# Patient Record
Sex: Male | Born: 1955 | Race: White | Hispanic: No | Smoking: Former smoker
Health system: Southern US, Community
[De-identification: ages and names within clinical notes are randomized; demographics above are authoritative.]

## PROBLEM LIST (undated history)

## (undated) DIAGNOSIS — I10 Essential (primary) hypertension: Secondary | ICD-10-CM

## (undated) DIAGNOSIS — E119 Type 2 diabetes mellitus without complications: Secondary | ICD-10-CM

## (undated) DIAGNOSIS — I639 Cerebral infarction, unspecified: Secondary | ICD-10-CM

## (undated) DIAGNOSIS — I509 Heart failure, unspecified: Secondary | ICD-10-CM

## (undated) DIAGNOSIS — J189 Pneumonia, unspecified organism: Secondary | ICD-10-CM

## (undated) DIAGNOSIS — K219 Gastro-esophageal reflux disease without esophagitis: Secondary | ICD-10-CM

## (undated) DIAGNOSIS — I219 Acute myocardial infarction, unspecified: Secondary | ICD-10-CM

## (undated) DIAGNOSIS — I2510 Atherosclerotic heart disease of native coronary artery without angina pectoris: Secondary | ICD-10-CM

## (undated) DIAGNOSIS — E1140 Type 2 diabetes mellitus with diabetic neuropathy, unspecified: Secondary | ICD-10-CM

## (undated) DIAGNOSIS — E785 Hyperlipidemia, unspecified: Secondary | ICD-10-CM

## (undated) DIAGNOSIS — E11319 Type 2 diabetes mellitus with unspecified diabetic retinopathy without macular edema: Secondary | ICD-10-CM

## (undated) HISTORY — DX: Type 2 diabetes mellitus without complications: E11.9

## (undated) HISTORY — PX: BELOW KNEE LEG AMPUTATION: SUR23

## (undated) HISTORY — PX: PERCUTANEOUS PLACEMENT INTRAVASCULAR STENT CERVICAL CAROTID ARTERY: SUR1019

## (undated) HISTORY — PX: APPENDECTOMY: SHX54

## (undated) HISTORY — DX: Cerebral infarction, unspecified: I63.9

---

## 2005-07-11 DIAGNOSIS — I639 Cerebral infarction, unspecified: Secondary | ICD-10-CM

## 2005-07-11 HISTORY — DX: Cerebral infarction, unspecified: I63.9

## 2006-01-17 ENCOUNTER — Inpatient Hospital Stay (HOSPITAL_COMMUNITY): Admission: EM | Admit: 2006-01-17 | Discharge: 2006-01-29 | Payer: Self-pay | Admitting: Emergency Medicine

## 2006-01-17 ENCOUNTER — Ambulatory Visit: Payer: Self-pay | Admitting: Internal Medicine

## 2006-01-18 ENCOUNTER — Encounter (INDEPENDENT_AMBULATORY_CARE_PROVIDER_SITE_OTHER): Payer: Self-pay | Admitting: Neurology

## 2006-01-18 ENCOUNTER — Encounter: Payer: Self-pay | Admitting: Cardiovascular Disease

## 2006-01-18 ENCOUNTER — Ambulatory Visit: Payer: Self-pay | Admitting: Cardiovascular Disease

## 2006-01-19 ENCOUNTER — Ambulatory Visit: Payer: Self-pay | Admitting: Physical Medicine & Rehabilitation

## 2006-03-17 ENCOUNTER — Emergency Department (HOSPITAL_COMMUNITY): Admission: EM | Admit: 2006-03-17 | Discharge: 2006-03-18 | Payer: Self-pay | Admitting: Emergency Medicine

## 2006-04-21 ENCOUNTER — Ambulatory Visit: Payer: Self-pay | Admitting: Cardiology

## 2006-04-25 ENCOUNTER — Ambulatory Visit: Payer: Self-pay

## 2006-04-25 ENCOUNTER — Encounter: Payer: Self-pay | Admitting: Cardiology

## 2006-07-03 ENCOUNTER — Ambulatory Visit (HOSPITAL_COMMUNITY): Admission: RE | Admit: 2006-07-03 | Discharge: 2006-07-03 | Payer: Self-pay | Admitting: Unknown Physician Specialty

## 2006-07-24 ENCOUNTER — Encounter: Payer: Self-pay | Admitting: Interventional Radiology

## 2006-10-10 ENCOUNTER — Ambulatory Visit (HOSPITAL_COMMUNITY): Admission: RE | Admit: 2006-10-10 | Discharge: 2006-10-11 | Payer: Self-pay | Admitting: Interventional Radiology

## 2006-10-24 ENCOUNTER — Encounter: Payer: Self-pay | Admitting: Interventional Radiology

## 2006-12-12 ENCOUNTER — Inpatient Hospital Stay (HOSPITAL_COMMUNITY): Admission: EM | Admit: 2006-12-12 | Discharge: 2006-12-19 | Payer: Self-pay | Admitting: Emergency Medicine

## 2007-05-04 ENCOUNTER — Ambulatory Visit (HOSPITAL_COMMUNITY): Admission: RE | Admit: 2007-05-04 | Discharge: 2007-05-04 | Payer: Self-pay | Admitting: Interventional Radiology

## 2007-06-14 ENCOUNTER — Inpatient Hospital Stay (HOSPITAL_COMMUNITY): Admission: RE | Admit: 2007-06-14 | Discharge: 2007-06-15 | Payer: Self-pay | Admitting: Interventional Radiology

## 2007-06-28 ENCOUNTER — Encounter: Payer: Self-pay | Admitting: Interventional Radiology

## 2008-01-25 ENCOUNTER — Ambulatory Visit: Payer: Self-pay | Admitting: *Deleted

## 2008-01-25 ENCOUNTER — Ambulatory Visit: Payer: Self-pay | Admitting: Thoracic Surgery (Cardiothoracic Vascular Surgery)

## 2008-01-26 ENCOUNTER — Inpatient Hospital Stay (HOSPITAL_COMMUNITY): Admission: EM | Admit: 2008-01-26 | Discharge: 2008-02-18 | Payer: Self-pay | Admitting: Emergency Medicine

## 2008-01-28 ENCOUNTER — Encounter: Payer: Self-pay | Admitting: Thoracic Surgery (Cardiothoracic Vascular Surgery)

## 2008-01-28 ENCOUNTER — Ambulatory Visit: Payer: Self-pay | Admitting: Surgery

## 2008-02-06 ENCOUNTER — Ambulatory Visit: Payer: Self-pay | Admitting: Physical Medicine & Rehabilitation

## 2008-03-18 ENCOUNTER — Ambulatory Visit: Payer: Self-pay | Admitting: Thoracic Surgery (Cardiothoracic Vascular Surgery)

## 2008-03-18 ENCOUNTER — Encounter
Admission: RE | Admit: 2008-03-18 | Discharge: 2008-03-18 | Payer: Self-pay | Admitting: Thoracic Surgery (Cardiothoracic Vascular Surgery)

## 2008-09-05 ENCOUNTER — Encounter: Payer: Self-pay | Admitting: Interventional Radiology

## 2010-08-01 ENCOUNTER — Encounter: Payer: Self-pay | Admitting: Interventional Radiology
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
## 2010-11-23 NOTE — Op Note (Signed)
NAME:  [name], [name] NO.:  [id number]   MEDICAL RECORD NO.:  [id number]         PATIENT TYPE:  CINP   LOCATION:                               FACILITY:  [facility]   PHYSICIAN:  Salvatore Decent. Dorris Fetch, M.D.DATE OF BIRTH:  1955/07/16   DATE OF PROCEDURE:  01/29/2008  DATE OF DISCHARGE:                               OPERATIVE REPORT   PREOPERATIVE DIAGNOSIS:  Severe 3-vessel coronary artery disease status  post non-Q-wave myocardial infarction.   POSTOPERATIVE DIAGNOSIS:  Severe 3-vessel coronary artery disease status  post non-Q-wave myocardial infarction.   PROCEDURES:  1. Median sternotomy.  2. Extracorporeal circulation.  3. Coronary artery bypass grafting x4 (left internal mammary artery to      mid LAD, saphenous vein graft to obtuse marginal, and sequential      saphenous vein graft to acute marginal and distal right coronary).  4. Endoscopic vein harvest, both thighs.   SURGEON:  Salvatore Decent. Dorris Fetch, MD   ASSISTANTS:  Rowe Clack, PA-C   ANESTHESIA:  General.   FINDINGS:  Saphenous vein in the lower thigh, too small to use  bilaterally, upper thigh vein good quality, likely bifurcated system.  Left ventricular hypertrophy, apical scar.  Distal LAD, heavily  calcified not graftable.  Proximal LAD, fair quality target.  Fair  quality, intramyocardial.  Acute marginal, fair quality.  Distal right  coronary, poor quality vein.  Satisfactory internal mammary artery, good  quality.   CLINICAL NOTE:  Mr. [name] is a 55 year old gentleman with multiple  cardiac risk factors and a known history of coronary artery disease.  He  presents with unstable coronary syndrome and ruled in for non-Q-wave  myocardial infarction.  At cardiac catheterization, he had severe 3-  vessel coronary artery disease with impaired left ventricular function.  The patient was referred for coronary artery bypass grafting.  The  indications, risks, benefits, and alternatives were  discussed in detail  with the patient.  He understood and accepted the risks and agreed to  proceed.   Mr. [place] [name] was brought to the preop holding area on January 29, 2008.  There, the Anesthesia Service placed lines for monitoring, arterial,  central venous, and pulmonary arterial pressure.  Intravenous  antibiotics were administered.  He was taken to the operating room,  anesthetized, and intubated.  A Foley catheter was placed.  The chest,  abdomen, and legs were prepped and draped in the usual fashion.  A  median sternotomy was performed, and the left internal mammary artery  was harvested using standard technique.  This was difficult due to the  patient's body habitus, relatively noncompliant chest wall, and previous  traumatic injury to the upper ribs with deformity.  The mammary however  was a good quality vessel.  Simultaneously, an incision was made in the  medial aspect of the left leg, just below the knee.  The greater  saphenous vein was identified and was harvested to the groin.  After  removing the vein and cannulating, it was apparent that the lower  portion of the vein was not suitable for use as a  bypass graft.  The  upper portion of the vein was satisfactory.  Therefore, an incision was  made in the medial aspect of the right leg, at the level of the knee,  and the greater saphenous vein was then harvested endoscopically from  that side as well.  Again, the lower portion of the thigh vein was  unusable but there was sufficient length in the upper thigh to use as a  bypass graft.  Heparin 5000 units was administered during the vessel  harvest.  The remainder of the full heparin dose was given prior to  opening the pericardium.   The pericardium was opened.  The ascending aorta was inspected.  It was  normal size.  There was no atherosclerotic disease.  The aorta was  cannulated via concentric 2 Ethibond pledgeted pursestring sutures.  A  dual-staged venous cannula was  placed via pursestring suture in the  right atrium and directed into the inferior vena cava.  The  cardiopulmonary bypass was instituted, and the patient was cooled to 32  degrees Celsius.  The coronary arteries were inspected and anastomotic  sites were chosen.  Of note, exposure of the coronaries was difficult.  The obtuse marginal was intramyocardial.  The heart was markedly  enlarged and hypertrophied.  There was distal apical scarring of the  left ventricle.  The distal LAD was a heavily calcified nongraftable  vessel.  There was no graftable target at the apex of the LAD either.  The distal right coronary was heavily calcified.  It was unclear if this  or the acute marginal was the vessel that was seen filling via  collaterals as the proximal right coronary artery was totally occluded  at catheterization.  A decision was made to sequentially graft these 2  vessels.  The conduits were inspected and cut to length.  A foam pad was  placed in the pericardium to protect the left phrenic nerve.  A  temperature probe was placed in the myocardial septum and a cardioplegic  cannula was placed in the ascending aorta.   The aorta was cross clamped.  The left ventricle was emptied via the  aortic root vent.  Cardiac arrest was then achieved with combination of  cold antegrade blood cardioplegia and topical iced saline.  Cardioplegia  1 L was administered.  The myocardial septal temperature was 11 degrees  Celsius.  The following distal anastomoses were performed.   First, a reverse saphenous vein graft was placed sequentially to the  acute marginal and distal right coronary.  The distal right coronary was  a 1.5-mm vessel and the probe did pass but would not pass into any of  the small terminal branches.  It was diffusely diseased and poor  quality.  The acute marginal was a 1.5-mm fair-quality vessel, much  better in quality than the distal right.  A side-to-side anastomosis was  performed  to the acute marginal and end to side to the distal right was  performed with running 7-0 Prolene sutures.  All anastomoses were probed  proximally and distally at their completion to ensure patency.  Cardioplegia was administered down the vein graft to assess flow and  hemostasis.   Next, a reverse saphenous vein graft was placed end to side to the  obtuse marginal branch of the left circumflex.  This was a very  difficult exposure due to the patient's body habitus and the size of the  heart and the vessel being intramyocardial.  The vein graft was  anastomosed end to side with a running 7-0 Prolene suture.  A 1.5-mm  probe did pass easily proximally and distally.  Cardioplegia was  administered.  There was good flow and good hemostasis during the  anastomosis.   Next, the pericardium was divided to allow passage of the left internal  mammary artery.  It was cut to length and then anastomosed end to side  to the mid LAD above the takeoff the large diagonal branch.  The LAD was  too diffusely diseased distally to bypass in that area.  The mammary was  anastomosed end-to-side with a running 8-0 Prolene suture.  The LAD was  a fair-quality target and at that site it was extremely poor distally.  The mammary was a good-quality conduit.  After completion of the mammary  to LAD anastomosis, the bulldog clamps were briefly removed to inspect  for hemostasis.  Septal rewarming was noted.  The bulldog clamp was  replaced.  The mammary pedicle was tacked up to the cardial surface of  the heart with 6-0 Prolene sutures.   An additional dose of cardioplegia then was administered.  The  cardioplegic cannula was removed from the ascending aorta.  The vein  graft was cut to length and the proximal vein graft anastomoses were  performed to 4.5-mm punch aortotomies with running 6-0 Prolene sutures.  At the completion of all proximal anastomosis, the patient was placed in  Trendelenburg position.   Lidocaine was administered.  Bulldog clamp was  again removed from the left mammary artery.  The aortic root was de-  aired and the aortic cross-clamp was removed.  Total cross-clamp time  was 18 minutes.   The patient spontaneously resumed sinus rhythm and did not require  defibrillation.  While the patient was being rewarmed, all proximal and  distal anastomoses were inspected for hemostasis.  Epicardial pacing  wires were placed on the right ventricle and right atrium.  A low-dose  dopamine infusion was initiated at 5 mcg/kg per minute.  When the  patient had rewarmed to a core temperature of 37 degrees Celsius, he was  weaned from cardiopulmonary bypass.  He was paced for sinus bradycardia  at the time of separation from bypass.  He weaned from bypass on the  first attempt without difficulty.  Total bypass time was 127 minutes.  Initial cardiac index was 2 L per minute per meter squared, and the  patient remained hemodynamically stable throughout the post bypass  period.   A test dose of protamine was administered and was well tolerated.  The  atrial and aortic cannulae were removed.  The remainder of the protamine  was administered without incident.  The chest was irrigated with 1 L of  warm normal saline containing 1 g of vancomycin.  Hemostasis was  achieved.  Left pleural and 2 mediastinal chest tubes were placed at  separate subcostal incisions.  Pericardium could not be closed.  The  sternum was closed with interrupted heavy-gauge double stainless steel  wires.  The pectoralis fascia, subcutaneous tissue, and skin were closed  in standard fashion.  All sponge, needle, and instrument counts were  correct at the end of the procedure.  The patient was taken from the  operating room to the Surgical Intensive Care Unit in critical but  stable condition.   Of note, the patient is not a candidate for redo bypass grafting.       Salvatore Decent Dorris Fetch, M.D.  Electronically  Signed     SCH/MEDQ  D:  01/29/2008  T:  01/30/2008  Job:  [id number]   cc:   Corky Crafts, MD

## 2010-11-23 NOTE — Assessment & Plan Note (Signed)
OFFICE VISIT   [name], [name]  DOB:  February 12, 1956                                        March 18, 2008  CHART #:  [id number]   HISTORY:  The patient is a 55 year old disabled vet with a history of  multiple cardiac risk factors and known history of coronary artery  disease who presented with unstable angina and ruled in for a non-Q-wave  myocardial infarction in July 2009.  He subsequently underwent cardiac  catheterization and coronary artery bypass grafting x4.  The surgical  procedure was performed by Dr. Charlett Lango on January 29, 2008.  He  had a somewhat difficult postoperative course due to mobility and  multiple comorbidities, but ultimately did quite well and was discharged  on February 15, 2008.  Since that time, he did have a superficial infection  in the left lower extremity.  Endoscopic vein harvest site was treated  with [facility] course [facility].  This has shown a  significant improvement.  He states that his breathing is much improved  postoperatively.  He no longer has orthopnea, which he apparently did  have preoperatively.  He denies any symptoms of chest pain.  He  interestingly now says that since the surgery, he no longer has daily  headaches.  He is seen today in the office for routine followup.   Chest x-ray is reviewed on today's date.  It shows stable postoperative  changes with no acute findings.  Lungs are clear without evidence of  congestive failure or infiltrates.   PHYSICAL EXAMINATION:  Vital Signs:  Blood pressure 135/84, pulse 82 and  regular, respirations 18, and oxygen saturation is 96% on room air.  General:  He is a well developed, adult, moderately obese disabled male  in no acute distress.  He walks with a cane.  Pulmonary:  Reveals clear  breath sounds throughout.  Cardiac:  Regular rate and rhythm without  murmurs, gallops, or rubs.  Extremities:  Reveal bilateral lower  extremity edema.   Incisions are all healing well without evidence of  infection.   MEDICATIONS:  1. Lopid 600 mg b.i.d.  2. Lisinopril 2.5 mg daily.  3. Plavix 75 mg daily.  4. Amiodarone 200 mg b.i.d.  5. Aspirin 325 mg daily.  6. Prozac 20 mg daily.  7. Lasix 40 mg daily.  8. Neurontin 100 mg t.i.d.  9. Potassium chloride 40 mEq daily.  10.Lopressor 25 mg t.i.d.  11.Prilosec OTC daily.  12.Lantus insulin 40 units subcu nightly.  13.Novolin R 35 units subcu t.i.d.   ASSESSMENT:  The patient continues to make good recovery following a  surgery.  He reports that he will receive his ongoing cardiology  followup through the [facility].  He has an appointment scheduled for  March 31, 2008.  We will see him again on a p.r.n. basis as needed  for any followup surgical issues.   Rowe Clack, P.A.-C.   Sherryll Burger  D:  03/18/2008  T:  03/19/2008  Job:  [id number]   cc:   Corky Crafts, MD

## 2010-11-23 NOTE — Discharge Summary (Signed)
NAMEMarland Kitchen  CODI, KERTZ                 ACCOUNT NO.:  [id number]   MEDICAL RECORD NO.:  [id number]          PATIENT TYPE:  INP   LOCATION:  5711                         FACILITY:  [facility]   PHYSICIAN:  Herbie Saxon, MDDATE OF BIRTH:  03-20-1956   DATE OF ADMISSION:  12/12/2006  DATE OF DISCHARGE:                               DISCHARGE SUMMARY   DISCHARGE DIAGNOSES:  1. Right foot cellulitis and abscess.  2. Diabetes.  3. Hypertension.  4. History of cerebrovascular acc  5. History of coronary artery disease.  6. Hyperlipidemia.  7. Carotid artery disease.  8. Retinopathy.  9. Cataracts.  10.Xerosis.  11.Dysarthria.   HOSPITAL COURSE:  This is a 55 year old male who attends the [facility] in [place].  Primary care physician is Dr. Lesle Chris.  Presented to the [facility] emergency room with swelling and redness of  his right leg which had been ongoing for 5 days.  At presentation he had  a leukocytosis of 15,000.  The glucose level was 192.  The patient was  started on IV vancomycin and Unasyn.  His right foot ulcer has also been  dressed daily.  hypokalemia was supplemented.  The third day of  admission he spiked a high-grade fever of 102.  The blood culture  repeated.  By the fourth day of admission the abscess wound was  discharging purulent material from the right foot ulcer.  He was also  noted to be hypoglycemic, needing reduction of his NPH insulin dose by  half and also reduction of the Glucotrol by half.  Wound culture was  sent; so far, no organism growing.  Blood culture has been negative so  far.  Avelox was added to his antibiotic regimen on December 15, 2006.  The  patient has remained afebrile.  Right foot wound is cool and no longer  discharging purulent material.  He is being discharged home in stable  condition to follow up with his primary care physician, Dr. Lesle Chris, at the  [facility] in [place].  He is to see his primary care physician in 5-  7 days to  have a repeat CBC and BMP at that time.  He is asked to be  seen by the wound care clinic as an outpatient in a week.  The right  foot wound will be packed with Aquacel daily .   DIET:  Will be low sodium, American Heart Association type, low  cholesterol 1800 calorie ADA.   ACTIVITY:  Increase activity slowly.  To be provided with off-loading  shoe for ambulation.   MEDICATIONS ON DISCHARGE:  1. Levaquin 750 mg daily for 1 week.  2. Hydrocodone 10/500 mg q.6h. p.r.n.  3. Plavix 75 mg daily.  4. Restoril 30 mg q.h.s. p.r.n.  5. Iron sulfate 325 mg b.i.d.  6. Vitamin C 500 mg daily.  7. Lasix 20 mg daily.  8. KCl 10 mEq daily.  9. Gemfibrozil 600 mg b.i.d.  10.Simvastatin 40 mg b.i.d.  11.Atenolol 45 mg daily.  12.Lisinopril 40 mg daily.  13.Glipizide 2.5 mg daily.  14.NPH insulin 5 units subcu  q.h.s.  15.Aspirin 325 mg daily.  16.Metformin 500 mg daily.  17.Prilosec 20 mg daily.   DISPOSITION:  Home with home health assistance for dressing change and  wound care.   EXAMINATION TODAY:  GENERAL:  He is a middle-aged man, not in acute  distress.  Temperature is 98, pulse is 84, respiratory rate is 20, blood  pressure 113/72.  Pale, not jaundiced.  Neck supple.  No clubbing or  dehydration.  HEENT:  Head is atraumatic, normocephalic.  Oropharynx is clear.  Posterior pharynx also clear.  CHEST:  Clinically clear, sounds 1 and 2, regular.  ABDOMEN:  Benign.  NEUROLOGIC:  He is alert, oriented x3.  He has dysarthria and mild right  hemiparesis.  He has xerosis of the left lower eyelid and has reduced  visual acuity.  EXTREMITIES:  Right foot punctate wound, granulated, right plantar ulcer  wound, no active bleeding, no tenderness in the right foot dorsum or  plantar surface.   Also note the patient will need to have a podiatry appointment in 2-3  weeks.  This will be coordinated by the primary care physician.  The  patient is also to be seen by orthopedics in 1-2 weeks as an  outpatient.  This is also to be coordinated by the primary care physician.  If  possible, assess the viability of the right small toe.      Herbie Saxon, MD  Electronically Signed     MIO/MEDQ  D:  12/19/2006  T:  12/19/2006  Job:  [id number]

## 2010-11-23 NOTE — Discharge Summary (Signed)
NAME:  MERLEN, GURRY NO.:  [id number]   MEDICAL RECORD NO.:  [id number]          PATIENT TYPE:  INP   LOCATION:  2016                         FACILITY:  [facility]   PHYSICIAN:  Salvatore Decent. Dorris Fetch, M.D.DATE OF BIRTH:  Apr 14, 1956   DATE OF ADMISSION:  01/25/2008  DATE OF DISCHARGE:  02/18/2008                               DISCHARGE SUMMARY   ADDENDUM:  Mr. Groesbeck was tentatively scheduled for transfer to the [facility]  facility on February 14, 2008.  This was unable to be accomplished.  He has  remained quite stable and is showing a steady improvement.  There have  been no additional changes to his medication regimen.  He continues to  follow his current rehabilitation protocols with cardiac rehabilitation  department.  He is continuing to tolerate diet with his current  restrictions and is having no evidence of ongoing dysphagia.  His  incisions all continue to heal quite well.  He is remaining in normal  sinus rhythm without significant ectopy or dysrhythmias.  His oxygen has  been weaned and he has good saturations on room air.  His overall status  is felt to be stable for discharge to the [place] facility on February 18, 2008, pending morning round re-evaluation.  For full details of this  hospitalization please see the previously dictated summary.      Rowe Clack, P.A.-C.      Salvatore Decent Dorris Fetch, M.D.  Electronically Signed    WEG/MEDQ  D:  02/17/2008  T:  02/17/2008  Job:  [id number]   cc:   Salvatore Decent. Dorris Fetch, M.D.  Corky Crafts, MD  Audery Amel, MD  Floyde Parkins
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
## 2010-11-23 NOTE — Discharge Summary (Signed)
NAME:  Clayton [name], Clayton [name]                 ACCOUNT NO.:  [id number]   MEDICAL RECORD NO.:  [id number]          PATIENT TYPE:  OIB   LOCATION:  3106                         FACILITY:  [facility]   PHYSICIAN:  Sanjeev K. Deveshwar, M.D.DATE OF BIRTH:  1955-10-25   DATE OF ADMISSION:  06/14/2007  DATE OF DISCHARGE:  06/15/2007                               DISCHARGE SUMMARY   ADDENDUM:  It was also noted prior to discharge, the patient had low-  grade tachycardia with a heart rate in the low 100s.  The etiology of  this was initially felt secondary to pain.  However, this did not  improve throughout the day.  The patient was afebrile.  His maximum  temperature was 99.2 during the night.  However during the day of  discharge, he was afebrile.  The patient also had some mild hematuria  which was thought secondary to being anticoagulated with heparin and  possible trauma during Foley catheterization placement.  He had a mildly  elevated white count of 11.2 thousand.  Prior to admission, his white  blood cell count was in the 8000 range.  The patient did not appear ill.  He had no specific complaints at time of discharge.  It was recommended  that he have a repeat urinalysis as well as a repeat CBC and potassium  level within 1 week to further evaluate these issues.      Delton See, P.A.    ______________________________  Grandville Silos. Corliss Skains, M.D.    DR/MEDQ  D:  06/15/2007  T:  06/16/2007  Job:  [id number]   cc:   Floyde Parkins

## 2010-11-23 NOTE — H&P (Signed)
NAME:  Clayton [name], Clayton [name] NO.:  [id number]   MEDICAL RECORD NO.:  [id number]          PATIENT TYPE:  INP   LOCATION:  5711                         FACILITY:  [facility]   PHYSICIAN:  Della Goo, M.D. DATE OF BIRTH:  08/28/1955   DATE OF ADMISSION:  12/12/2006  DATE OF DISCHARGE:                              HISTORY & PHYSICAL   CHIEF COMPLAINT:  Redness and swelling of the right leg.   HISTORY OF PRESENT ILLNESS:  This is a 55 year old male who presents to  the emergency department with complaints of swelling and redness of his  right leg for 5 days.  He reports contacting the [facility] in  [place] and was evaluated and placed on Augmentin therapy initially  without relief.  Then, his medication was changed to Keflex on December 11, 2006.  The patient denies having any fevers, chills, shortness of breath  or chest pain, nausea, vomiting.Marland Kitchen   PAST MEDICAL HISTORY:  History of a cerebrovascular accident with right-  sided weakness coronary artery disease, type 2 diabetes mellitus,  hyperlipidemia, occlusion and stenosis of the right carotid artery with  stent placement, hypertension, gastroesophageal reflux disease, cataract  right eye, retinopathy, xerosis and dysarthria.   MEDICATIONS:  1. Keflex 500 mg one p.o. q.6 h for 7-day course.  2. Hydrocodone 10/500 one p.o. q.6 h p.r.n.  3. Plavix 75 mg one p.o. daily.  4  Lasix 20 mg one p.o. daily.  1. Restoril 30 mg one p.o. q.h.s. p.r.n.  2. Gemfibrozil 600 mg one p.o. b.i.d.  3. Atenolol 25 mg one p.o. daily.  4. Flonase nasal spray one spray b.i.d.  5. Glipizide 10 mg one p.o. b.i.d.  6. Lisinopril 40 mg one p.o. daily.  7. Omeprazole 20 mg one p.o. daily.  8. Simvastatin 80 mg half a tablet p.o. b.i.d.  9. Metformin 1000 mg one p.o. b.i.d.  10.NPH 10 units subcu q.h.s.  11.Aspirin 325 mg one p.o. daily.   ALLERGY:  NO KNOWN DRUG ALLERGIES.   SOCIAL HISTORY:  The patient is disabled, lives with his  mother.  No  tobacco history.  No alcohol history.   FAMILY HISTORY:  Noncontributory to this hospitalization.   PHYSICAL EXAMINATION:  HEENT: Normocephalic, atraumatic.  Pupils equally  round and reactive to light.  Extraocular muscles are intact.  There is  no scleral icterus.  Oropharynx is clear.  NECK:  Supple full range.  Full of motion.  No thyromegaly, adenopathy,  jugular venous distension.  CARDIOVASCULAR:  Regular rate and rhythm.  LUNGS:  Clear to auscultation bilaterally.  ABDOMEN:  Positive bowel sounds, soft, nontender, nondistended.  EXTREMITIES:  There is confluent erythema of the right lower extremity  to the mid calf area.  The area is edematous with 2+ edema and painful  to palpation.  NEUROLOGIC:  The patient is alert and oriented x3.  His speech is mildly  dysarthric.  Cranial nerves are intact.  He does have right sided  weakness.   LABORATORY STUDIES:  White blood cell count 18.3, hemoglobin 12.2,  hematocrit 35.5, platelets 166, MCV 86.9.  Sodium 139, potassium 3.9,  chloride 108, bicarb 21.6, BUN 19, creatinine 1.0, glucose 192.   ASSESSMENT:  A 55 year old male being admitted with:  1. Cellulitis right lower extremity.  2. Type 2 diabetes mellitus.  3. Hypertension.  4. Coronary artery disease.  5. Previous CVA.   PLAN:  The patient will be placed on IV antibiotic therapy of vancomycin  and Unasyn.  DVT and GI prophylaxis will be ordered.  Also please note  that the information was obtained through interview with the patient.  Also, a copy of the medical evaluation from the [facility] records.      Della Goo, M.D.  Electronically Signed     HJ/MEDQ  D:  12/13/2006  T:  12/14/2006  Job:  [id number]

## 2010-11-23 NOTE — Consult Note (Signed)
NAME:  Clayton [name], Clayton [name] NO.:  [id number]   MEDICAL RECORD NO.:  [id number]          PATIENT TYPE:  INP   LOCATION:  2901                         FACILITY:  [facility]   PHYSICIAN:  Salvatore Decent. Hendrickson, M.D.DATE OF BIRTH:  1955-12-23   DATE OF CONSULTATION:  01/26/2008  DATE OF DISCHARGE:                                 CONSULTATION   REASON FOR CONSULTATION:  Three-vessel coronary disease status post non-  Q-wave MI.   HISTORY OF PRESENT ILLNESS:  Clayton [name] is a 55 year old gentleman with  multiple medical problems including history of CAD, peripheral vascular  disease, previous right carotid stent, and diabetes.  He became acutely  short of breath about 7:30 last night.  He denied any chest pain or  tightness or pressure.  He came to the [facility] Emergency Room.  He  was placed on BiPAP for acute respiratory failure.  Chest x-ray  suggested edema.  He had nonspecific ST changes on his EKG and ruled in  by enzymes with a troponin of 0.33.  Interestingly, his BNP was only 76.  His subsequent cardiac enzymes revealed a CK of 501 and MB of 39 and a  troponin of 4.02.  He was taken to cardiac catheterization laboratory  this morning by Dr. Catalina Gravel, where he was found to have severe three-  vessel coronary disease.  His right coronary was totally occluded.  The  circumflex had a tight proximal stenosis.  He had a large OM1 which was  diffusely irregular.  His LAD had a proximal 70% stenosis and then in  the distal portion of the vessel, there was severe diffuse disease with  minimal flow.  He had an inferior akinesis.  His ejection fraction was  approximately 35%, and LVEDP was 24.  The patient currently is without  complaint of chest pain or shortness of breath.  He is uncomfortable  having to lie flat on his back following catheterization.   His past medical history is significant for;  1. Coronary artery disease.  2. Previous MI in 1988.  3. History of CVA.  4. Right carotid stent placed in December 2008.  5. Hypertension.  6. Hyperlipidemia.  7. Type 1 insulin-dependent diabetes.  8. Peripheral vascular disease.  9. COPD.  10.Obstructive sleep apnea.  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  12.Gastroesophageal reflux.  13.Pain medication dependence.   His current medications or medications on admission were,  1. Aspirin 325 mg daily.  2. Lasix 40 mg daily.  3. Fluoxetine 20 mg daily.  4. Enalapril 10 mg twice daily.  5. Prilosec 20 mg a day.  6. Metoprolol 25 mg b.i.d.  7. Plavix 75 mg a day, but apparently he has not been taking, it is      unclear.  8. Pravastatin.  9. Lantus insulin 35 units subcu nightly.  10.Gemfibrozil 600 mg b.i.d.   He has no known drug allergies.   His family history is significant for coronary artery disease in his  father.   He is disabled.  He lives with his mother.  He does not smoke, use  alcohol, or drugs but does have a history of pain medication dependence.   REVIEW OF SYSTEMS:  Denied any recent chest pain or shortness of breath  prior to this acute episode.  He states his sugars have been fairly well  controlled.  He does have residual speech impairment from his previous  stroke, bruises easily since he has been on Plavix but had to stop  Plavix because of financial issues.   PHYSICAL EXAMINATION:  GENERAL:  Clayton [name] is a 55 year old white male.  He is in no acute distress.  VITALS SIGNS:  His blood pressure is 125/61, pulse is 106 and regular.  Respirations are 20.  His oxygen saturation is 90% on 2 L nasal cannula.  NEUROLOGIC:  Alert.  He is communicative.  He does have some slurring of  the speech, some halting of the speech, but does comprehend and is  comprehensible.  HEENT:  Poor dentition.  NECK:  No bruits.  CARDIAC:  Regular rate and rhythm.  Normal S1 and S2, did not hear a  murmur.  There is an S4.  LUNGS:  Crackles at the bases.  ABDOMEN:  Soft and nontender.   EXTREMITIES:  Not able to palpate dorsalis pedis or posterior tibial  pulses.  He is status post amputation of his digit on his right foot and  the amputation wound well healed.  He does have multiple scars on his  legs, but he is not able to tell me the origin of.   IMPRESSION:  The patient is a 55 year old diabetic who presents with a  non-Q-wave myocardial infarction, has severe three-vessel coronary  disease.  Coronary bypass graft is indicated for survival benefit and  myocardial preservation.  He does have diffuse disease of his  coronaries, and left anterior descending in particular distally is  horribly diseased  and probably not revascularizable.  However, he does  have a proximal stenosis compromising the midportion of his left  anterior descending, which gives rise to septal perforators and diagonal  branch.  He does have a graftable target in his circumflex as well as  his right coronary distributions.  He will be high risk for surgery  given his extensive medical history including diabetes, chronic  obstructive pulmonary disease, obstructive sleep apnea, previous stroke,  right carotid stent, and peripheral vascular disease.  He does need  carotid duplex to assess for carotid stenosis and also needs vein  mapping.  We will attempt to do these studies on Monday, January 28, 2008,  and then hopefully can proceed with surgery on Tuesday, January 29, 2008.      Salvatore Decent Dorris Fetch, M.D.  Electronically Signed     SCH/MEDQ  D:  01/26/2008  T:  01/26/2008  Job:  [id number]   cc:   Corky Crafts, MD  Floyde Parkins
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
## 2010-11-23 NOTE — Discharge Summary (Signed)
NAME:  Clayton [name], Clayton [name] [name] NO.:  [id number]   MEDICAL RECORD NO.:  [id number]          PATIENT TYPE:  INP   LOCATION:  2016                         FACILITY:  [facility]   PHYSICIAN:  Clayton [name] Clayton [name] [name]. Dorris Fetch, M.D.DATE OF BIRTH:  1955/09/07   DATE OF ADMISSION:  01/25/2008  DATE OF DISCHARGE:                               DISCHARGE SUMMARY   FINAL DIAGNOSIS:  Severe three-vessel coronary artery disease, status  post non-Q-wave myocardial infarction.   IN-HOSPITAL DIAGNOSES:  1. Left 60% to 80% internal carotid artery stenosis.  2. Volume overload postoperatively.  3. Acute blood loss anemia postoperatively.  4. Postoperative deconditioning.  5. Postoperative atrial fibrillation.  6. Postoperative moderate-to-severe oropharyngeal dysphagia.  7. Postoperative bronchitis.   SECONDARY DIAGNOSES:  1. History of coronary artery disease with history of myocardial      infarction in 1988.  2. History of cerebrovascular accident.  3. Status post right carotid stent placed in December 2008.  4. Hypertension.  5. Hyperlipidemia.  6. Type 1 insulin-dependent diabetes.  7. Peripheral vascular disease.  8. Chronic obstructive pulmonary disease.  9. Obstructive sleep apnea.  10.Previous right foot forefoot amputation.  11.Gastroesophageal reflux disease.  12.Pain medication dependence.   IN-HOSPITAL OPERATIONS AND PROCEDURES:  1. Cardiac catheterization.  2. Coronary artery bypass grafting x4 using a left internal mammary      artery to mid LAD, saphenous vein graft to obtuse marginal,      sequential saphenous vein graft to acute marginal and distal right      coronary artery.  Endoscopic vein harvest from bilateral thighs.   HISTORY AND PHYSICAL/HOSPITAL COURSE:  Mr. Clayton [name] [name] is a 55 year old  gentleman with multiple medical problems,  including history of coronary  artery disease, peripheral vascular disease, previous right carotid  stent, and diabetes.  The patient developed  acute shortness of breath  and came to [facility] emergency room.  The patient had nonspecific ST  changes on EKG and ruled in by enzymes with a troponin of 0.33.  The  patient was taken to cardiac catheterization by Dr. Eldridge Dace, where he  was found to have severe 3-vessel coronary artery disease.  Dr.  Dorris Fetch was consulted following cardiac catheterization.  Dr.  Dorris Fetch saw and evaluated the patient.  He discussed with the  patient undergoing coronary artery bypass grafting.  He discussed the  risks and benefits with the patient.  The patient nodded his  understanding and agreed to proceed.  Surgery was scheduled for January 29, 2008.  Preoperatively the patient underwent bilateral carotid duplex  ultrasound showing on the right no ICA stenosis.  On the left he had 60%  to 80% ICA stenosis.  The patient also had preoperative ABIs done,  showing bilaterally to be 8.86.  The patient remained stable  preoperatively.  For details of the patient's past medical history and  physical exam, please see dictated H&P.   The patient was taken to the operating room, where he underwent coronary  artery bypass grafting x4 using a left internal mammary artery to mid  LAD, saphenous vein graft to obtuse marginal,  sequential saphenous vein  graft to acute marginal and distal right coronary artery.  Bilateral  endovein harvesting of thighs was done.  The patient tolerated this  procedure well and was transferred to the intensive care unit in stable  condition.  Postoperatively, the patient was noted to be hemodynamically  stable.  He was able to be extubated the evening of surgery.  Post  extubation, the patient noted to be alert and oriented  x4.  Neuro  intact.  Postoperatively the patient was able to be weaned from all  drips.  He had minimum drainage from chest tubes and postoperative chest  x-ray was stable.  All lines and chest tubes were discontinued in the  normal fashion.  The patient  had several complications postoperatively.  His pulmonary status was slow to progress.  He continued to be  encouraged to use his incentive spirometer.  The patient required nasal  cannula 2 liters postoperatively.  Followup chest x-rays were obtained,  which showed persistent atelectasis.  The patient developed  leukocytosis, felt questionable secondary to pulmonary status.  He was  started on empiric Avelox.  The patient's pulmonary status did start to  improve,  and he was eventually able to be weaned off oxygen, satting  greater than 90% on room air.  We will plan to continue p.o. Avelox for  a total of 10 days.   From a cardiac standpoint, the patient was noted to be in normal sinus  rhythm postoperatively.  Heart rate and blood pressure were stable.  Unfortunately, the patient went into postoperative atrial fibrillation  around postop day 8.  He was started on IV amiodarone and continued on  Lopressor.  Cardiology was asked to come and reevaluate the patient.  They agreed with IV amiodarone.  The patient was able to convert back to  normal sinus rhythm with medication.  He was switched from IV amiodarone  to p.o.  The patient did remain in normal sinus rhythm the remainder of  his postoperative course.  The patient's heart rate and blood pressure  remained stable on amiodarone and Lopressor.   Postoperatively, the patient was having difficulty swallowing.  A Panda  tube was placed, and he was started on tube feeds.  Speech therapy was  consulted.  A bedside swallow evaluation was done, and it was felt that  the patient was at high aspiration risk secondary to dysphagia.  They  recommended doing a FEES study.  This was done on July 24, showing  moderate oropharyngeal dysphagia exacerbated with fatigue.  They  recommended placing the patient NPO and to  repeat FEES in several days.  The patient was continued on tube feeds and placed NPO at that time.  Speech repeated MBS on July 29,  which showed that the patient continued  with severe pharyngeal dysphagia.  Recommended keeping NPO and repeating  in several days.  A repeat MBS was obtained on August 4, which showed  improvement in the patient's dysphagia.  They recommended advancing the  patient to a D-III diet with nectar-thick liquids.  They evaluated the  patient with this on August 5 and felt that the patient was tolerating  well.  Recommended continuing on a D-III diet with nectar-thick liquids.  The patient's tube feeds and Panda tube were discontinued February 13, 2008.  The patient continued to tolerate diet well.   Postoperatively, the patient also developed significant volume overload.  He required several doses of diuretics.  Daily weights were obtained.  The patient's volume overload was improving prior to discharge.  The  patient also had some acute blood loss anemia postoperatively.  This  remained stable.  The patient did not require any transfusions.  Hemoglobin and hematocrit were monitored and remained stable.  Last H&H  were 9.6 and 28.9.  Postoperatively the patient was working with cardiac  rehab with ambulation.  The patient was slow to progress.  Physical  therapy was consulted.  PT started to work with the patient and felt  that he was significantly deconditioned.  They recommended inpatient  rehab versus skilled nursing facility.  Rehab was consulted, who felt  that the patient would benefit better from a skilled nursing facility.  Case management consulted and discussed with the patient possible  placement.  Search was started.  Currently, the patient may have a bed  today, February 14, 2008.   Postoperatively, the patient's blood sugars were followed closely.  He  is known to be a type 1 diabetic.  Lantus insulin was adjusted for the  patient when started on tube feedings.  Once started taking in oral  diet, Lantus insulin was increased appropriately.  Blood sugars were  followed and were  stable prior to discharge.   On February 14, 2008, the patient was noted to be in normal sinus rhythm.  Pulmonary status was stable.  He was afebrile.  Blood pressure, heart  rate stable.  Last labs obtained on August 4 showed a white blood cell  count 17.3, hemoglobin 9.6, hematocrit 28.9, and platelet count 310.  Sodium 135, potassium 4.0, chloride 96, bicarb of 33, BUN of 15,  creatinine 0.97, and glucose of 85.  The patient's incisions noted all  to be clean, dry and intact and healing well.  The patient was felt to  be medically stable for transfer to a skilled nursing facility today,  August, 6, 2009.   FOLLOWUP APPOINTMENTS:  Our office will contact the patient with a  followup appointment to see Dr. Dorris Fetch in 3 weeks.  The patient  will need to obtain a PA and lateral chest x-ray 30 minutes prior to  this appointment.  The patient will need to follow up with Dr. Eldridge Dace  in 2 weeks.  He will need to contact Dr. Hoyle Barr office to make these  arrangements.   ACTIVITY:  Patient instructed no driving until released to do so, no  lifting over 10 pounds.  He is told to ambulate 3 to 4 times per day,  progress as tolerated and continue working with rehab.  The patient is  to continue using his breathing exercises.   INCISIONAL CARE:  The patient is told he is allowed to shower, washing  his incisions using soap and water.  Please contact the office if he  develops any drainage or opening from any of his incision sites.   DIET:  The patient's diet to be a dysphagia III diet with nectar-thick  liquids.  The patient is to have strict swallow precautions.   DISCHARGE MEDICATIONS:  1. Lopid 600 mg p.o. b.i.d.  2. Plavix 75 mg p.o. daily.  3. Crestor 20 mg at night.  4. Amiodarone 400 mg b.i.d. x10 days, then 200 mg b.i.d.  5. Aspirin 325 mg daily.  6. Colace 100 mg daily.  7. Prozac 20 mg daily.  8. Lasix 40 mg daily.  9. Neurontin 100 mg t.i.d.  10.Potassium chloride 40  mEq daily.  11.Lopressor 25 mg t.i.d.  12.Avelox 400 mg daily x7 days.  13.Protonix 40 mg b.i.d.  14.Lantus insulin 40 units subcu at night.  15.Ultram 50 mg 1 to 2 tablets q.4-6 h p.r.n.  16.Oxycodone 5 mg 1 to 2 tablets q.4-6 h p.r.n.  17.Thick-It Instant Food Thickener 5 grams p.o. p.r.n.   Please evaluate the patient's blood sugars a.c. and h.s.  If remaining  elevated, the patient may need to be restarted on his Novolin R 35 units  subcu 3 times per day.      Theda Belfast, PA      Clayton [name] Clayton [name] [name]. Dorris Fetch, M.D.  Electronically Signed    KMD/MEDQ  D:  02/14/2008  T:  02/14/2008  Job:  [id number]   cc:   Clayton [name] Clayton [name] [name]. Dorris Fetch, M.D.  Corky Crafts, MD

## 2010-11-23 NOTE — H&P (Signed)
NAME:  Clayton [name], STEINHOFF NO.:  [id number]   MEDICAL RECORD NO.:  [id number]          PATIENT TYPE:  EMS   LOCATION:  MAJO                         FACILITY:  [facility]   PHYSICIAN:  Audery Amel, MD    DATE OF BIRTH:  [birth year]   DATE OF ADMISSION:  01/25/2008  DATE OF DISCHARGE:                              HISTORY & PHYSICAL   CARDIOLOGIST:  Corky Crafts, MD.   CHIEF COMPLAINT:  Shortness of breath.   HISTORY OF PRESENT ILLNESS:  Mr. Clayton [name] is a 55 year old obese white male  with a history of CAD (remote MI) and peripheral vascular disease who  presents with a chief complaint of shortness of breath.  The patient  states he was seen earlier today in [place] at the [facility] by  his primary care Keatyn Luck.  At that time, the patient states he was in  his usual state of health.  This evening after eating dinner, he became  acutely short of breath approximately 7:30 in the evening.  His symptoms  continued to worsen therefore, EMS was mobilized.  The patient denies  ever having any chest pain.  He was brought to the emergency department  at [facility] for further evaluation.  On arrival, the patient was in  moderate respiratory distress.  He was initiated on BiPAP and  subsequently his breathing subjectively improved significantly.  Also of  note, the patient was quite hypertensive on presentation with a blood  pressure of 188/116.  He does note that he did not take any of his  medications today.  His initial EKG revealed nonspecific ST/T wave  changes.  His initial cardiac biomarkers were elevated with a troponin-I  0.33 and CK-MB of 17.  His chest x-ray did reveal mild pulmonary edema.  However, his BNP was only 76.  On my initial evaluation, the patient's  breathing had subjectively improved significantly with BiPAP.  He was  able to answer questions appropriately.  The patient states he suffered  an MI in 1988, however, did not require any  revascularization.  The  patient has not been followed regularly by a cardiologist.  Currently,  he is hemodynamically stable and otherwise without complaints.   PAST MEDICAL HISTORY:  1. CAD.  2. Peripheral vascular disease (bilateral carotid artery disease)      status post right carotid artery stenting.  3. Hypertension.  4. Hyperlipidemia.  5. Diabetes.  6. GERD.  7. Likely COPD and obstructive sleep apnea.  8. Status post right transmetatarsal amputation.   ALLERGIES:  NO KNOWN DRUG ALLERGIES.   CURRENT MEDICATIONS:  1. Aspirin 325 mg once daily.  2. Lasix 40 mg once daily.  3. Fluoxetine 20 mg once daily.  4. Enalapril 10 mg b.i.d.  5. Prilosec 20 mg once daily.  6. Metoprolol 25 mg b.i.d.  7. Clopidogrel 75 mg once daily.  8. Pravastatin 20 mg once daily.  9. Lantus 35 units q.h.s.  10.Gemfibrozil 600 mg b.i.d.   SOCIAL HISTORY:  The patient lives in [place] with his mother.  He is  currently on disability.  He denies any tobacco, alcohol or illicit  substance.   FAMILY HISTORY:  His mother is alive and well.  His father died at age  of 51 secondary to valvular heart disease.  He has one sister who is  alive and well.  No major medical problems.   REVIEW OF SYSTEMS:  As per HPI, otherwise complete review of system was  obtained and is negative except as documented.   PHYSICAL EXAMINATION:  VITAL SIGNS:  Blood pressure is 156/78, heart  rate is 98, O2 sats are 95% on BiPAP, FIO2 of 60%.  GENERAL:  The patient is alert and oriented x3, mild distress, pleasant  with dysarthric speech.  HEENT:  Normocephalic, atraumatic, EOMI, PERL, nares patent, OP is clear  without erythema or exudate.  NECK:  Supple, full range of motion, no appreciable JVD.  I am unable to  appreciate any carotid bruits secondary to background noise from his  BiPAP.  There is no palpable thyromegaly or lymphadenopathy.  CARDIOVASCULAR:  Distant S1-S2 with no audible murmurs, rubs or  gallops.  His PMI is nonpalpable.  He has 1+ posterior tibialis pulses in the  bilateral lower extremities.  LUNGS:  Diminished breath sounds equal bilaterally with bibasilar  crackles and occasional expiratory wheezing.  SKIN:  Warm, dry, intact.  ABDOMEN:  Obese, soft, nontender, nondistended.  Positive bowel sounds.  No hepatosplenomegaly palpable.  GU:  Normal male genitalia.  EXTREMITIES:  Reveal no clubbing, cyanosis or edema.  The patient is  status post right transmetatarsal amputation.  MUSCULOSKELETAL:  No joint deformity or effusions noted.  NEUROLOGIC:  The patient has clear signs of dysarthria and prior stroke.  Otherwise, his exam was nonfocal.   DIAGNOSTICS:  Mild pulmonary edema, no infiltrate identified.  EKG by my  interpretation reveals sinus tachycardia with nonspecific ST/T wave  changes.  There is no evidence of acute injury or ischemia.   LABORATORY DATA:  White count 11.2, hematocrit 39, platelet count 174,  sodium 138, potassium 3.6, chloride 106, CO2 is 25, BUN 16, creatinine  0.8, glucose 279, CK-MB 17.6, troponin-I 0.33.   IMPRESSION:  1. Non-ST elevation myocardial infarction.  2. Hypercarbic respiratory failure.  3. Hypertension.  4. Hyperlipidemia.  5. Diabetes.  6. Peripheral vascular disease.  7. Gastroesophageal reflux disease.   PLAN:  We will plan to admit the patient to the CCU for further  evaluation and monitoring.  On my initial examination, the patient's  breathing had improved significantly and he was hemodynamically stable,  although still requiring BiPAP.  On review of the patient's EKG, there  are nonspecific ST changes, but no evidence of acute injury.  His  cardiac biomarkers are elevated, however, his presentation is not  consistent with that of an acute coronary syndrome.  I suspect this  represents a type 2 myocardial infarction secondary to hypertensive  urgency and hypercarbic respiratory failure.  We will initiate an   unfractionated heparin drip and continue his aspirin and Plavix therapy.  There are no dynamic EKG changes.  The patient is currently asymptomatic  and therefore I do not feel that 2b/3a inhibitor is warranted at this  time.  We will cycle his cardiac biomarkers to establish a trend with  serial EKGs.  He will likely require cardiac catheterization for further  risk stratification.  With regards to the patient's hypercarbic  respiratory failure, we will continue BiPAP at this time.  We will  repeat his ABG and reassess his respiratory status.  There is  no  indication for intubation at this time.  On review of his chest x-ray, I  did not appreciate any infiltrates and therefore do not feel antibiotics  are warranted.  The patient did have mild pulmonary edema and given that  the patient was quite hypertensive on presentation, this certainly could  represent flash pulmonary edema.  We will treat with Lasix IV and  nitroglycerin drip for blood pressure control.  The patient did not take  any of his home medications today and we will continue his metoprolol 25  mg b.i.d. as well as enalapril 10 mg b.i.d.  The patient is diabetic and  requires insulin.  I will continue his basal insulin dose of Lantus 35  units q.h.s.  In addition, we will cover with sliding-scale insulin and  maintain the patient n.p.o.      Audery Amel, MD  Electronically Signed     SHG/MEDQ  D:  01/26/2008  T:  01/26/2008  Job:  [id number]
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
## 2010-11-26 NOTE — Discharge Summary (Signed)
NAME:  Clayton [name], Clayton [name] NO.:  [id number]   MEDICAL RECORD NO.:  [id number]          PATIENT TYPE:  INP   LOCATION:  6742                         FACILITY:  [facility]   PHYSICIAN:  Beatrix Fetters, M.D.     DATE OF BIRTH:  02-Aug-1955   DATE OF ADMISSION:  01/17/2006  DATE OF DISCHARGE:                                 DISCHARGE SUMMARY   ADDENDUM:  The patient stayed in the hospital until January 20, 2006 due to  waiting for inpatient rehabilitation at one of the [place] [facility] here in  [place].  During this week, there was only 1 change to his medication  and that was Percocet 5/325 given every 4 hours p.r.n. pain.  Otherwise,  there was no other change to his medications.  Patient's case will be  evaluated by his primary care physician at the [facility], Dr. [name],  on Monday, January 30, 2006, to determine whether the patient will receive  outpatient rehab at the [facility].   PHYSICAL EXAMINATION:  VITAL SIGNS:  Today January 27, 2006, his T-max was  98.6, his systolic blood pressure ranged from 112 to 134, diastolic blood  pressure 70 to 85, pulse range from 78 to 107, respirations 18 to 20.  His  CBGs were 111 to 152 and his ox saturation was 95% to 98% on room air.   There were no new labs since previous discharge summary.      Beatrix Fetters, M.D.  Electronically Signed     CA/MEDQ  D:  01/27/2006  T:  01/27/2006  Job:  [id number]
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
## 2010-11-26 NOTE — Consult Note (Signed)
NAME:  MUSAB, WINGARD NO.:  [id number]   MEDICAL RECORD NO.:  [id number]          PATIENT TYPE:  EMS   LOCATION:  MAJO                         FACILITY:  [facility]   PHYSICIAN:  Casimiro Needle L. Reynolds, M.D.DATE OF BIRTH:  09/30/1955   DATE OF CONSULTATION:  DATE OF DISCHARGE:                                   CONSULTATION   CHIEF COMPLAINT:  Dysarthria and unsteadiness of gait.   HISTORY OF PRESENT ILLNESS:  This is the initial inpatient consultation  evaluation of this 55 year old man with a past medical history, which  includes hypertension, diabetes and hyperlipidemia.  The patient reports  that yesterday about 4 p.m. he noticed slurring of his speech and  unsteadiness of his gait. He denies having any vertigo, just that he felt  unsteady walking as if he would fall, and then today, he had a much more  marked slurring of his speech and difficulty swallowing.  He came to the  emergency room for evaluation of these symptoms where they had been stable.  He says he has had a previous stroke before, which caused him with right  sided weakness and slurred speech, from which he recovered from very well.  He denies any other recent symptoms.   PAST MEDICAL HISTORY:  His medical problems are managed at the [place] in [facility].  He has a history of diabetes, which he is on insulin and oral  medications, as well as hyperlipidemia, for which he is on medications.  He  tells me that he had some kind of bleeding in his head, and was taken care  by Dr. Jeral Fruit and Dr. Danielle Dess at the time, and that he was hospitalized at  [facility] for this.  Unfortunately, I could not find any records about this.   FAMILY HISTORY:  Negative for stroke.   SOCIAL HISTORY:  He quit smoking 20 years ago.  He lives at home with his  mother, and is normally independent of his activities of daily living.   ALLERGIES:  No known drug allergies   MEDICATIONS:  List of medications include niacin, metformin,  glipizide,  lisinopril, simvastatin, atenolol, aspirin 325 mg daily, rosiglitazone and  subcutaneous insulin.   PHYSICAL EXAMINATION:  VITAL SIGNS: Temperature 97.4, blood pressure 132/82,  pulse 98, respirations 16, O2 sat 94% on room air.  GENERAL: This is a healthy-appearing man, supine in the hospital bed in no  distress.  HEAD: Cranium is normocephalic, atraumatic.  OROPHARYNX: Benign.  NECK: Supple without carotid or supraclavicular bruits.  HEART: Regular rate and rhythm without murmurs.   NEUROLOGIC EXAMINATION:  MENTAL STATUS: He is awake and alert.  He is  oriented to time, place and person.  Recent and remote memory are adequate  for history giving.  He has a little bit today labile affect with some  emotional incontinence, becoming tearful near the end of the interview.  His  speech is quite dysarthric, and much of the time, unintelligible; however,  he is able to follow commands easily and his speech is normal in content.  FUNDUSCOPIC EXAM: Reveals proliferative changes.  Pupils are equal and  briskly active.  Extraocular movements are full without nystagmus.  Facial  fields full with confrontation.  Hearing is intact with conversational  speech.  Facial sensation is diminished on the right versus the left.  He  has right facial weakness.  His tongue deviates to the right a little bit.  The pallet always in the midline.  MOTOR: Normal bulk and tone.  He has a mild right hemiparesis.  SENSORY:  He reports decreased sensation of the right hand greater than the  left and over both feet.  Otherwise, intact to light touch and pin prick  throughout.  COORDINATION: Rapid movement was performed slowly on the left.  He had  ataxia with finger-to-nose test and heel-to-shin testing on the left.  Reflexes 1+ upper extremities, absent in the lower extremities.  Toes are  upgoing bilaterally.  GAIT: Deferred.   LABORATORY REVIEW:  CBC: White count 9.7, hemoglobin 17.9, platelets   182,000, with a normal differential.  C-Met remarkable for an elevated  glucose of 261, otherwise normal.  Urinalysis remarkable for glucose,  otherwise negative.  CT of the head is personally reviewed, and demonstrates  an old lacunar infarct in the left lateral thallus and internal capsule  area.  It is read out as demonstrating no acute infarct, although I question  that abnormality in the dorsal mid brain on the left.   IMPRESSION:  1. Acute posterior circulation stroke with left hemiataxia and profound      dysarthria and dysphagia.  2. Right hemiparesis under examination with history of left brain      subcortical stroke.  Unclear of the findings secondary to his old      stroke or his new one.  3. Multiple vascular risk factors, including hypertension, hyperlipidemia      and diabetes.  4. Diabetic peripheral neuropathy by examination.   PLAN:  Per conversation with the ER physician, Dr. Ignacia Palma, we will admit  to [facility] Teaching Service with the Stroke Service consulting.  He needs a  stroke workup, including MRI to brain with MRA of the intracranial  circulation, 2-D echocardiogram, carotid and transcranial Dopplers, stroke  labs, and telemetry monitoring.  He definitely needs to be n.p.o. until he  is evaluated by speech therapy.  He needs physical and occupational, and  likely rehab evals.  We will continue aspirin for stroke prophylaxis for  now, although eventually he will need to be switched to another agent, most  likely Aggrenox.  Stroke Service to follow.  Thank you for the consultation.      Michael L. Thad Ranger, M.D.  Electronically Signed     MLR/MEDQ  D:  01/17/2006  T:  01/18/2006  Job:  [id number]
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
## 2010-11-26 NOTE — Discharge Summary (Signed)
NAMEMarland Kitchen  ERMINIO, NYGARD NO.:  [id number]   MEDICAL RECORD NO.:  [id number]          PATIENT TYPE:  INP   LOCATION:  6742                         FACILITY:  [facility]   PHYSICIAN:  Alvester Morin, M.D.  DATE OF BIRTH:  04/05/1956   DATE OF ADMISSION:  DATE OF DISCHARGE:  01/20/2006                               DISCHARGE SUMMARY   DISCHARGE DIAGNOSES:  1. Cerebrovascular accident, new diagnosis by MRI.  2. Hyperlipidemia.  3. Diabetes mellitus type 2,  insulin-dependent.  4. Hypertension.  5. Anterior cruciate ligament and posterior cruciate ligament torn in      both knees.  6. History of diabetes mellitus neuropathy.  7. Acute myocardial infarction in 1988.   DISCHARGE MEDICATIONS:  1. Niacin 1000 mg p.o. b.i.d.  2. Metformin 1000 mg p.o. b.i.d.  3. Glipizide 10 mg p.o. b.i.d.  4. Simvastatin 40 mg p.o. b.i.d.  5. Aggrenox 1 capsule p.o. b.i.d.  6. Temazepam 30 mg p.o. nightly p.r.n.  7. Protonix or Prilosec over the counter daily.  8. Lantus as prior to admission.  9. Lisinopril 20 mg p.o. every day, to be started 2 weeks from date of      discharge.  10.Atenolol 25 mg p.o. every day, started 2 weeks prior to discharge.   SPECIAL INSTRUCTIONS:  Patient is going to be transferred to [facility].  He was instructed to schedule a followup appointment with his primary  care physician in 1-2 weeks.  He will be discharged from rehabilitation  facility.   CONSULTANT FOR THIS ADMISSION:  Dr. Thad Ranger with [facility].   PROCEDURES FOR THIS ADMISSION:  None.   IMAGES FOR THIS ADMISSION:  1. CT of the head on January 17, 2006 without contrast showed left basal      ganglion infarct, age indeterminate.  Hemoconcentrated blood in      __________.  2. __________  acute infarct.  MRI of the head on January 18, 2006 showed      a moderate-sized area of acute infarction in the right paramedian      __________  with a probable subacute ischemic infarct in the left    coronary artery, extending into the white matter adjacent to the      left thalamus laterally.  Nonspecific subcortical white matter was      present anteriorly, most likely related to ischemic __________      from small-vessel disease, right frontal subcortical lacunar      infarct, and mild-to-moderate __________  changes in the __________      and maxillary sinuses, with probable mucous retention based in the      maxillary sinuses bilaterally.  MRA shows severe decrease  in      caliber of the right internal carotid arteries in the pectoris      cavernous junction, suggestive of high-grade stenosis and decreased      caliber of the  posterior cerebral artery in the P1-P2 region,      which may be related to a vessel __________  or atherosclerotic      disease.  No evidence of intracranial aneurysm is seen in      __________  5 mm to the left again may not be seen on MRA      examination, per radiologist, and MRA of the neck showed suggestion      of marginal __________ left internal carotid artery at the      __________  on __________  % with a small ulcerative plaque.  High      suspicion of high-grade stenosis of the right internal carotid      artery at the petrosal cavernous junction.  __________  indicated      __________  the radiologist recommendation for reevaluation of the      former catheter angiogram.  A 2-D echo done on January 18, 2006 showed      a left ventricular mildly dilated overall left ventricle systolic      function normal, left ventricular wall thickness moderately      increased, aortic valve mildly calcified, with moderate mitral      annular calcification in the left atrium widely dilatated.  No      echocardiographic evidence for a cardiac source of embolism.   HISTORY OF PRESENT ILLNESS:  Mr. Chandley is a 55 year old white man with  past medical history significant for diabetes mellitus type 2 (insulin-  dependent), hypertension, hyperlipidemia, and previous  stroke in 2006  with residual mild weakness in upper and lower extremities.  Underwent  speech therapy in October of  2006, now presenting today __________  with progressive onset of slurred speech since the afternoon prior to  admission at 4 p.m., and also complaining of increased weakness in right  upper and lower extremities.  No numbness or tingling.  No difficulty  walking.   REVIEW OF SYSTEMS:  Otherwise negative - in particular for headache,  chest pain, shortness of breath, or palpitations.  Patient complained of  urinary incontinence since the day prior to admission, when he started  having the weakness.  When seen later, after admission, patient  complained actually of left upper and lower extremity weakness and  stated that the right upper and lower extremity weakness is significant  __________ .   CLINICAL EXAMINATION ON ADMISSION:  VITAL SIGNS:  Showed a temperature  of 97.4.  Heart rate of 98.  Blood pressure 134/82.  Respiratory rate  16.  O2 sat 94% on room air.  GENERAL:  No acute distress.  Slurred speech, but no problem finding his  room.  EYES:  PERRLA.  Extraocular movements intact.  NECK:  Supple.  No pain.  No adenopathies.  No jugular venous  distention.  RESPIRATORY:  Clear to auscultation.  CARDIOVASCULAR:  Regular rate and rhythm.  No murmurs or rubs or  gallops.  GASTROINTESTINAL:  Bowel sounds positive.  Nontender, nondistended.  EXTREMITIES:  No edema.  Pulses +2 bilaterally.  SKIN:  No lesions.  No rash.  NEUROLOGIC:  II, III, IV, and VI intact.  Strong deviation to the right.  There is residual right facial droop, 3/5 weakness on the left upper and  lower extremities.  Patient has slurred speech.  He could not understand  everything  he had been told, but he cannot pronounce the words  appropriately.  PSYCHOLOGICALLY:  He seems oriented x3, even though it is very difficult  to understand him.   LABORATORIES AT ADMISSION:  Sodium 142, potassium  3.3, chloride 102,  bicarbonate 30, BUN 13, creatinine 0.8, bilirubin of 126, alkaline  phosphatase of 111, SGOT 14, SGPT 16, protein 6. __________ , albumin  4.2, calcium 9.4, hemoglobin 17.9, white blood cell __________  , and  platelets 182, __________  of 6.7, __________  of 87.7.  UA showed a  glucose of more than 1000.  CT of the head, as I said previously, an MRI  and MRA __________  previously stated.   FINDINGS:  Problem #1.  MRI/MRA-proven acute cerebrovascular accident.  Unsure why this patient has such an extensive cerebral disease and  marginal stroke.  Could be because of embolism.  He has significant  carotid stenosis on the right, even though the distribution of stroke  bilaterally would not suggest the right carotid is primary culprit for  the multiple strokes that patient has.  Also, we suspect a possible left  atrium thrombus as the source of embolism.  The  2-D Echo was negative.  Patient might need followup with TEE and also a __________  study to  rule out a __________  stroke.  We could not get records from [place], [place].  We do not want other investigation patient underwent at this  facility.  We ordered a hypercoagulable panel.  As I said, a 2-D echo  was negative.  MRA of the neck showed carotid artery stenosis.  Also  patient has a slightly elevated hemoglobin which is suspicious, but he  is also a smoker, so the polycythemia might be secondary to chronic  smoking.  We checked a ferritin which was within normal limits, and  would rule out primary polycythemia.  The hypercoagulable panel shows an  antithrombin III of 96, protein C of 133, protein S 109, a PPP-LA of  36.1, DRVVT of 44.9 which is the only lab on the hypocoagulable panel  that is slightly high, __________  normal.  Lupus anticoagulant not  detected, and homocystine within normal limits at 10.6.  Patient was  started on Aggrenox.  He was rehydrated.  He was placed on a __________  diet and he  had speech therapy working on his pronunciation.  He is  going to be discharged at a rehab facility for further physical therapy.  He is going to follow up with his primary care physician and instructed  for further investigation of his source for multiple strokes.  Problem #2.  Hypertension.  Because of the presentation with an ischemic  stroke, the blood pressure medications were on hold, and he was advised  to restart them 2 weeks from the stroke.  The primary care physician to  reassess his status and decide on this  particular issue.  Problem #3.  Hyperlipidemia.  Patient was started on simvastatin.  He  was advised to continue this as prior to admission.  Problem #4.  Diabetes mellitus type 2, insulin-dependent.  Patient was  started on Lantus and sliding-scale insulin.  At discharge he was  advised to continue the Lantus as prior to admission, and to continue  metformin and glipizide, and also rosiglitazone was stopped due to  recent studies that show an increasing coronary artery disease.  Problem #5.  Prophylaxis.  Patient prophylactically was placed on  Protonix.  He will go home on Protonix or Prilosec for GI prophylaxis.   LABORATORIES AT DISCHARGE:  Sodium 144, chloride 111, bicarb 27, BUN 11,  creatinine 0.8, hemoglobin 16.3, white blood cell count 0.8, platelets  165   PLAN:  Patient will follow up with his primary care physician.  He might  need carotid endarterectomy  on the right.  He will need transcranial  Doppler to rule out PFO, and he will need to follow up on possible small-  vessel disease, possible vasculitis as cause of his multiple strokes.  He will continue on medications as instructed.      Vanetta Mulders, MD       Alvester Morin, M.D.     DA/MEDQ  D:  01/19/2006  T:  01/21/2006  Job:  [id number]

## 2010-12-08 ENCOUNTER — Other Ambulatory Visit (HOSPITAL_BASED_OUTPATIENT_CLINIC_OR_DEPARTMENT_OTHER): Payer: Self-pay | Admitting: Internal Medicine

## 2010-12-08 ENCOUNTER — Ambulatory Visit (HOSPITAL_COMMUNITY)
Admission: RE | Admit: 2010-12-08 | Discharge: 2010-12-08 | Disposition: A | Discharge status: Home / Self Care | Payer: Non-veteran care | Source: Ambulatory Visit | Attending: Internal Medicine | Admitting: Internal Medicine

## 2010-12-08 DIAGNOSIS — L089 Local infection of the skin and subcutaneous tissue, unspecified: Secondary | ICD-10-CM

## 2010-12-08 DIAGNOSIS — Z452 Encounter for adjustment and management of vascular access device: Secondary | ICD-10-CM | POA: Insufficient documentation

## 2010-12-08 MED ORDER — IOHEXOL 300 MG/ML  SOLN: 50.0000 mL | Freq: Once | INTRAMUSCULAR | Status: DC | PRN

## 2011-04-08 LAB — BASIC METABOLIC PANEL
BUN: 14
BUN: 15
BUN: 15
BUN: 15
BUN: 16
BUN: 18
BUN: 24 — ABNORMAL HIGH
BUN: 27 — ABNORMAL HIGH
BUN: 28 — ABNORMAL HIGH
CO2: 20
CO2: 24
CO2: 26
CO2: 27
CO2: 28
CO2: 30
CO2: 31
CO2: 31
CO2: 32
CO2: 32
Calcium: 7.5 — ABNORMAL LOW
Calcium: 8 — ABNORMAL LOW
Calcium: 8.2 — ABNORMAL LOW
Calcium: 8.3 — ABNORMAL LOW
Calcium: 8.4
Calcium: 8.5
Calcium: 8.5
Calcium: 8.6
Calcium: 8.6
Calcium: 8.8
Calcium: 9.2
Calcium: 8.2 — ABNORMAL LOW
Chloride: 101
Chloride: 101
Chloride: 103
Chloride: 103
Chloride: 106
Chloride: 98
Chloride: 96
Creatinine, Ser: 0.7
Creatinine, Ser: 0.72
Creatinine, Ser: 0.82
Creatinine, Ser: 0.82
Creatinine, Ser: 0.83
Creatinine, Ser: 0.85
Creatinine, Ser: 0.85
Creatinine, Ser: 0.86
Creatinine, Ser: 0.86
Creatinine, Ser: 0.89
Creatinine, Ser: 1.11
Creatinine, Ser: 1.29
Creatinine, Ser: 0.91
Creatinine, Ser: 0.97
GFR calc Af Amer: >60
GFR calc Af Amer: >60
GFR calc Af Amer: >60
GFR calc Af Amer: >60
GFR calc Af Amer: >60
GFR calc Af Amer: >60
GFR calc Af Amer: >60
GFR calc Af Amer: >60
GFR calc Af Amer: >60
GFR calc Af Amer: >60
GFR calc non Af Amer: >60
GFR calc non Af Amer: >60
GFR calc non Af Amer: >60
GFR calc non Af Amer: >60
GFR calc non Af Amer: >60
GFR calc non Af Amer: >60
GFR calc non Af Amer: >60
GFR calc non Af Amer: >60
Glucose, Bld: 129 — ABNORMAL HIGH
Glucose, Bld: 135 — ABNORMAL HIGH
Glucose, Bld: 157 — ABNORMAL HIGH
Glucose, Bld: 164 — ABNORMAL HIGH
Glucose, Bld: 172 — ABNORMAL HIGH
Glucose, Bld: 184 — ABNORMAL HIGH
Glucose, Bld: 185 — ABNORMAL HIGH
Glucose, Bld: 186 — ABNORMAL HIGH
Glucose, Bld: 229 — ABNORMAL HIGH
Potassium: 3.7
Potassium: 3.8
Potassium: 3.9
Potassium: 3.9
Potassium: 3.9
Potassium: 4
Sodium: 138
Sodium: 139
Sodium: 139
Sodium: 140
Sodium: 140
Sodium: 141
Sodium: 135

## 2011-04-08 LAB — CBC
HCT: 26 — ABNORMAL LOW
HCT: 26.9 — ABNORMAL LOW
HCT: 28.7 — ABNORMAL LOW
HCT: 28.8 — ABNORMAL LOW
HCT: 29.7 — ABNORMAL LOW
HCT: 33.6 — ABNORMAL LOW
Hemoglobin: 10.2 — ABNORMAL LOW
Hemoglobin: 10.7 — ABNORMAL LOW
Hemoglobin: 11.8 — ABNORMAL LOW
Hemoglobin: 14
Hemoglobin: 14.2
Hemoglobin: 8.9 — ABNORMAL LOW
Hemoglobin: 9.3 — ABNORMAL LOW
Hemoglobin: 9.5 — ABNORMAL LOW
Hemoglobin: 9.5 — ABNORMAL LOW
Hemoglobin: 9.7 — ABNORMAL LOW
MCHC: 33.1
MCHC: 33.5
MCHC: 33.6
MCHC: 34.1
MCHC: 34.3
MCHC: 34.6
MCHC: 34.7
MCHC: 35
MCHC: 35.3
MCHC: 35.4
MCHC: 35.5
MCHC: 33.1
MCHC: 34.3
MCV: 89.1
MCV: 89.2
MCV: 89.2
MCV: 89.5
MCV: 89.8
MCV: 90.3
MCV: 90.3
MCV: 90.7
MCV: 91
MCV: 91.3
MCV: 92.5
MCV: 89.6
Platelets: 143 — ABNORMAL LOW
Platelets: 149 — ABNORMAL LOW
Platelets: 149 — ABNORMAL LOW
Platelets: 160
Platelets: 168
Platelets: 169
Platelets: 179
Platelets: 239
Platelets: 384
Platelets: 392
Platelets: 310
RBC: 2.77 — ABNORMAL LOW
RBC: 2.86 — ABNORMAL LOW
RBC: 3 — ABNORMAL LOW
RBC: 3.01 — ABNORMAL LOW
RBC: 3.18 — ABNORMAL LOW
RBC: 3.18 — ABNORMAL LOW
RBC: 3.25 — ABNORMAL LOW
RBC: 3.76 — ABNORMAL LOW
RBC: 3.87 — ABNORMAL LOW
RBC: 4.02 — ABNORMAL LOW
RBC: 4.08 — ABNORMAL LOW
RBC: 4.41
RBC: 4.44
RBC: 3.22 — ABNORMAL LOW
RBC: 3.24 — ABNORMAL LOW
RDW: 15.6 — ABNORMAL HIGH
RDW: 15.8 — ABNORMAL HIGH
RDW: 15.9 — ABNORMAL HIGH
RDW: 16.2 — ABNORMAL HIGH
RDW: 16.5 — ABNORMAL HIGH
RDW: 16.5 — ABNORMAL HIGH
RDW: 16.6 — ABNORMAL HIGH
RDW: 16.8 — ABNORMAL HIGH
RDW: 17 — ABNORMAL HIGH
RDW: 17.4 — ABNORMAL HIGH
RDW: 17.4 — ABNORMAL HIGH
RDW: 17.8 — ABNORMAL HIGH
WBC: 10.3
WBC: 11.2 — ABNORMAL HIGH
WBC: 14.3 — ABNORMAL HIGH
WBC: 14.6 — ABNORMAL HIGH
WBC: 15.9 — ABNORMAL HIGH
WBC: 16.6 — ABNORMAL HIGH
WBC: 16.6 — ABNORMAL HIGH
WBC: 17.9 — ABNORMAL HIGH
WBC: 18 — ABNORMAL HIGH
WBC: 9.9
WBC: 17.3 — ABNORMAL HIGH

## 2011-04-08 LAB — POCT I-STAT 4, (NA,K, GLUC, HGB,HCT)
Glucose, Bld: 147 — ABNORMAL HIGH
Glucose, Bld: 147 — ABNORMAL HIGH
Glucose, Bld: 149 — ABNORMAL HIGH
Glucose, Bld: 179 — ABNORMAL HIGH
Glucose, Bld: 193 — ABNORMAL HIGH
HCT: 24 — ABNORMAL LOW
HCT: 24 — ABNORMAL LOW
HCT: 25 — ABNORMAL LOW
HCT: 34 — ABNORMAL LOW
Hemoglobin: 11.6 — ABNORMAL LOW
Hemoglobin: 12.2 — ABNORMAL LOW
Hemoglobin: 8.2 — ABNORMAL LOW
Hemoglobin: 8.5 — ABNORMAL LOW
Operator id: 3291
Operator id: 3291
Potassium: 3.6
Potassium: 3.7
Potassium: 3.8
Potassium: 4.5
Sodium: 135
Sodium: 135
Sodium: 138
Sodium: 139

## 2011-04-08 LAB — POCT I-STAT 3, ART BLOOD GAS (G3+)
Acid-Base Excess: 2
Acid-base deficit: 1
Acid-base deficit: 2
Bicarbonate: 22.7
Bicarbonate: 26.3 — ABNORMAL HIGH
Bicarbonate: 29 — ABNORMAL HIGH
O2 Saturation: 100
O2 Saturation: 93
O2 Saturation: 97
Operator id: 199821
Operator id: 3291
Operator id: 3291
Patient temperature: 38.2
TCO2: 24
TCO2: 28
TCO2: 31
pCO2 arterial: 40.5
pCO2 arterial: 46.5 — ABNORMAL HIGH
pCO2 arterial: 49.8 — ABNORMAL HIGH
pCO2 arterial: 64.9
pH, Arterial: 7.362
pH, Arterial: 7.379
pH, Arterial: 7.402
pO2, Arterial: 102 — ABNORMAL HIGH
pO2, Arterial: 265 — ABNORMAL HIGH

## 2011-04-08 LAB — HEPARIN LEVEL (UNFRACTIONATED)
Heparin Unfractionated: 0.24 — ABNORMAL LOW
Heparin Unfractionated: 0.27 — ABNORMAL LOW
Heparin Unfractionated: 0.38
Heparin Unfractionated: 0.39
Heparin Unfractionated: 0.46

## 2011-04-08 LAB — CARDIAC PANEL(CRET KIN+CKTOT+MB+TROPI)
CK, MB: 10.4 — ABNORMAL HIGH
Relative Index: 5.8 — ABNORMAL HIGH
Total CK: 295 — ABNORMAL HIGH
Troponin I: 3.7

## 2011-04-08 LAB — TYPE AND SCREEN: Antibody Screen: NEGATIVE

## 2011-04-08 LAB — POCT I-STAT, CHEM 8
BUN: 16
Calcium, Ion: 1.23
Creatinine, Ser: 1
TCO2: 21

## 2011-04-08 LAB — POCT CARDIAC MARKERS
CKMB, poc: 12.2
Myoglobin, poc: 333
Myoglobin, poc: 429
Operator id: 270651

## 2011-04-08 LAB — BLOOD GAS, ARTERIAL
Acid-base deficit: 6.7 — ABNORMAL HIGH
FIO2: 0.21
FIO2: 0.4
MECHVT: 0.65
O2 Saturation: 98.4
Patient temperature: 99.1
RATE: 14
TCO2: 18.2
pCO2 arterial: 29.3 — ABNORMAL LOW
pCO2 arterial: 44.4
pH, Arterial: 7.416
pO2, Arterial: 117 — ABNORMAL HIGH

## 2011-04-08 LAB — LIPID PANEL
HDL: 22 — ABNORMAL LOW
Triglycerides: 356 — ABNORMAL HIGH
VLDL: 71 — ABNORMAL HIGH

## 2011-04-08 LAB — URINALYSIS, ROUTINE W REFLEX MICROSCOPIC
Bilirubin Urine: NEGATIVE
Glucose, UA: 100 — AB
Glucose, UA: NEGATIVE
Glucose, UA: NEGATIVE
Hgb urine dipstick: NEGATIVE
Hgb urine dipstick: NEGATIVE
Hgb urine dipstick: NEGATIVE
Ketones, ur: NEGATIVE
Nitrite: NEGATIVE
Protein, ur: NEGATIVE
Protein, ur: NEGATIVE
Specific Gravity, Urine: 1.016
Specific Gravity, Urine: 1.024
Urobilinogen, UA: 1
Urobilinogen, UA: 1
pH: 5.5

## 2011-04-08 LAB — CREATININE, SERUM
Creatinine, Ser: 0.84
Creatinine, Ser: 1.12
GFR calc Af Amer: >60
GFR calc Af Amer: >60
GFR calc non Af Amer: >60
GFR calc non Af Amer: >60

## 2011-04-08 LAB — COMPREHENSIVE METABOLIC PANEL
Albumin: 3.7
Alkaline Phosphatase: 65
BUN: 21
Chloride: 101
Creatinine, Ser: 0.84
GFR calc non Af Amer: >60
Glucose, Bld: 168 — ABNORMAL HIGH
Potassium: 3.4 — ABNORMAL LOW
Total Bilirubin: 1.4 — ABNORMAL HIGH

## 2011-04-08 LAB — DIFFERENTIAL
Basophils Absolute: 0.1
Basophils Absolute: 0
Basophils Relative: 1
Basophils Relative: 0
Lymphocytes Relative: 12
Monocytes Absolute: 0.4
Monocytes Relative: 6
Neutro Abs: 9.4 — ABNORMAL HIGH
Neutro Abs: 14.9 — ABNORMAL HIGH
Neutrophils Relative %: 84 — ABNORMAL HIGH
Neutrophils Relative %: 81 — ABNORMAL HIGH

## 2011-04-08 LAB — URINE MICROSCOPIC-ADD ON

## 2011-04-08 LAB — CLOSTRIDIUM DIFFICILE EIA: C difficile Toxins A+B, EIA: NEGATIVE

## 2011-04-08 LAB — URINE CULTURE
Colony Count: >=100000
Special Requests: NEGATIVE

## 2011-04-08 LAB — HEMOGLOBIN A1C: Mean Plasma Glucose: 147

## 2011-04-08 LAB — MAGNESIUM
Magnesium: 2.4
Magnesium: 2.5
Magnesium: 2.8 — ABNORMAL HIGH

## 2011-04-08 LAB — POCT I-STAT GLUCOSE: Operator id: 151361

## 2011-04-08 LAB — PROTIME-INR
Prothrombin Time: 13.4
Prothrombin Time: 16.2 — ABNORMAL HIGH

## 2011-04-08 LAB — APTT: aPTT: 25

## 2011-04-08 LAB — POCT I-STAT 3, VENOUS BLOOD GAS (G3P V)
Bicarbonate: 29.3 — ABNORMAL HIGH
Operator id: 3291
pCO2, Ven: 52.2 — ABNORMAL HIGH
pO2, Ven: 36

## 2011-04-08 LAB — HEPATIC FUNCTION PANEL
ALT: 29
AST: 21
Albumin: 2.8 — ABNORMAL LOW

## 2011-04-18 LAB — CBC
HCT: 44.3
Hemoglobin: 13.2
MCHC: 34.9
Platelets: 171
Platelets: 193
RDW: 15
RDW: 15
WBC: 8.9

## 2011-04-18 LAB — PROTIME-INR
INR: 1
INR: 1.1
Prothrombin Time: 13.6

## 2011-04-18 LAB — BASIC METABOLIC PANEL
BUN: 10
BUN: 15
CO2: 26
Calcium: 8.2 — ABNORMAL LOW
Calcium: 9.4
Creatinine, Ser: 0.52
GFR calc non Af Amer: >60
GFR calc non Af Amer: >60
Glucose, Bld: 154 — ABNORMAL HIGH
Glucose, Bld: 162 — ABNORMAL HIGH
Potassium: 3.9
Sodium: 138

## 2011-04-18 LAB — DIFFERENTIAL
Basophils Absolute: 0
Lymphocytes Relative: 23
Lymphs Abs: 2.1
Neutro Abs: 6.3

## 2011-04-18 LAB — POCT I-STAT GLUCOSE
Glucose, Bld: 128 — ABNORMAL HIGH
Operator id: 153981

## 2011-04-18 LAB — APTT: aPTT: 29

## 2011-04-20 LAB — CBC
HCT: 38.3 — ABNORMAL LOW
MCV: 87.7
Platelets: 181
RDW: 14.6 — ABNORMAL HIGH

## 2011-04-20 LAB — PROTIME-INR: Prothrombin Time: 13.4

## 2011-04-20 LAB — BASIC METABOLIC PANEL
BUN: 15
Creatinine, Ser: 0.65
GFR calc non Af Amer: >60
Glucose, Bld: 161 — ABNORMAL HIGH
Potassium: 3.6

## 2011-04-20 LAB — APTT: aPTT: 33

## 2011-04-28 LAB — I-STAT 8, (EC8 V) (CONVERTED LAB)
Acid-base deficit: 4 — ABNORMAL HIGH
BUN: 19
Bicarbonate: 21.6
Chloride: 108
Glucose, Bld: 192 — ABNORMAL HIGH
HCT: 38 — ABNORMAL LOW
Hemoglobin: 12.9 — ABNORMAL LOW
Operator id: 196461
Potassium: 3.9
Sodium: 139
TCO2: 23
pCO2, Ven: 40.6 — ABNORMAL LOW
pH, Ven: 7.333 — ABNORMAL HIGH

## 2011-04-28 LAB — BASIC METABOLIC PANEL
BUN: 5 — ABNORMAL LOW
BUN: 8
Calcium: 8 — ABNORMAL LOW
Calcium: 8.3 — ABNORMAL LOW
Chloride: 108
Chloride: 108
Chloride: 109
Creatinine, Ser: 0.65
Creatinine, Ser: 0.66
Creatinine, Ser: 0.69
GFR calc Af Amer: >60
GFR calc Af Amer: >60
GFR calc non Af Amer: >60
GFR calc non Af Amer: >60
Glucose, Bld: 122 — ABNORMAL HIGH
Glucose, Bld: 47 — ABNORMAL LOW
Potassium: 3.4 — ABNORMAL LOW
Potassium: 3.5
Sodium: 141

## 2011-04-28 LAB — CBC
HCT: 27.4 — ABNORMAL LOW
HCT: 30 — ABNORMAL LOW
Hemoglobin: 9.6 — ABNORMAL LOW
MCHC: 34.7
MCHC: 35
MCV: 86.4
MCV: 87.2
Platelets: 322
Platelets: 356
Platelets: 369
Platelets: 398
Platelets: 466 — ABNORMAL HIGH
RBC: 3.08 — ABNORMAL LOW
RBC: 3.2 — ABNORMAL LOW
RDW: 14.9 — ABNORMAL HIGH
RDW: 14.9 — ABNORMAL HIGH
RDW: 15.1 — ABNORMAL HIGH
RDW: 15.2 — ABNORMAL HIGH
RDW: 15.2 — ABNORMAL HIGH
WBC: 12 — ABNORMAL HIGH
WBC: 13.8 — ABNORMAL HIGH
WBC: 15.9 — ABNORMAL HIGH
WBC: 16.3 — ABNORMAL HIGH
WBC: 18.3 — ABNORMAL HIGH

## 2011-04-28 LAB — VANCOMYCIN, TROUGH: Vancomycin Tr: 8.3

## 2011-04-28 LAB — HEMOGLOBIN A1C: Hgb A1c MFr Bld: 8.8 — ABNORMAL HIGH

## 2011-04-28 LAB — WOUND CULTURE

## 2011-04-28 LAB — POCT I-STAT CREATININE
Creatinine, Ser: 1
Operator id: 196461

## 2011-04-28 LAB — CULTURE, BLOOD (ROUTINE X 2)
Culture: NO GROWTH
Culture: NO GROWTH

## 2014-08-09 ENCOUNTER — Observation Stay (HOSPITAL_COMMUNITY)
Admission: EM | Admit: 2014-08-09 | Discharge: 2014-08-15 | Disposition: A | Discharge status: Skilled Nursing Facility | Payer: Medicare HMO | Attending: Internal Medicine | Admitting: Internal Medicine

## 2014-08-09 ENCOUNTER — Emergency Department (HOSPITAL_COMMUNITY): Payer: Medicare HMO

## 2014-08-09 ENCOUNTER — Encounter (HOSPITAL_COMMUNITY): Payer: Self-pay | Admitting: *Deleted

## 2014-08-09 DIAGNOSIS — I25119 Atherosclerotic heart disease of native coronary artery with unspecified angina pectoris: Secondary | ICD-10-CM | POA: Diagnosis not present

## 2014-08-09 DIAGNOSIS — E780 Pure hypercholesterolemia: Secondary | ICD-10-CM | POA: Diagnosis not present

## 2014-08-09 DIAGNOSIS — I6523 Occlusion and stenosis of bilateral carotid arteries: Secondary | ICD-10-CM | POA: Diagnosis not present

## 2014-08-09 DIAGNOSIS — I771 Stricture of artery: Secondary | ICD-10-CM

## 2014-08-09 DIAGNOSIS — Z888 Allergy status to other drugs, medicaments and biological substances status: Secondary | ICD-10-CM | POA: Insufficient documentation

## 2014-08-09 DIAGNOSIS — Z87891 Personal history of nicotine dependence: Secondary | ICD-10-CM | POA: Diagnosis not present

## 2014-08-09 DIAGNOSIS — Z7982 Long term (current) use of aspirin: Secondary | ICD-10-CM | POA: Insufficient documentation

## 2014-08-09 DIAGNOSIS — Z8673 Personal history of transient ischemic attack (TIA), and cerebral infarction without residual deficits: Secondary | ICD-10-CM | POA: Diagnosis not present

## 2014-08-09 DIAGNOSIS — I10 Essential (primary) hypertension: Secondary | ICD-10-CM | POA: Diagnosis not present

## 2014-08-09 DIAGNOSIS — Z89512 Acquired absence of left leg below knee: Secondary | ICD-10-CM | POA: Insufficient documentation

## 2014-08-09 DIAGNOSIS — R4781 Slurred speech: Secondary | ICD-10-CM | POA: Diagnosis not present

## 2014-08-09 DIAGNOSIS — Z794 Long term (current) use of insulin: Secondary | ICD-10-CM | POA: Insufficient documentation

## 2014-08-09 DIAGNOSIS — Z79899 Other long term (current) drug therapy: Secondary | ICD-10-CM | POA: Diagnosis not present

## 2014-08-09 DIAGNOSIS — G459 Transient cerebral ischemic attack, unspecified: Secondary | ICD-10-CM

## 2014-08-09 DIAGNOSIS — E11319 Type 2 diabetes mellitus with unspecified diabetic retinopathy without macular edema: Secondary | ICD-10-CM | POA: Diagnosis not present

## 2014-08-09 DIAGNOSIS — I252 Old myocardial infarction: Secondary | ICD-10-CM | POA: Insufficient documentation

## 2014-08-09 DIAGNOSIS — R531 Weakness: Secondary | ICD-10-CM | POA: Diagnosis not present

## 2014-08-09 DIAGNOSIS — K219 Gastro-esophageal reflux disease without esophagitis: Secondary | ICD-10-CM | POA: Diagnosis not present

## 2014-08-09 DIAGNOSIS — E1142 Type 2 diabetes mellitus with diabetic polyneuropathy: Secondary | ICD-10-CM | POA: Diagnosis not present

## 2014-08-09 DIAGNOSIS — E1140 Type 2 diabetes mellitus with diabetic neuropathy, unspecified: Secondary | ICD-10-CM | POA: Insufficient documentation

## 2014-08-09 DIAGNOSIS — N289 Disorder of kidney and ureter, unspecified: Secondary | ICD-10-CM

## 2014-08-09 DIAGNOSIS — I739 Peripheral vascular disease, unspecified: Secondary | ICD-10-CM | POA: Insufficient documentation

## 2014-08-09 DIAGNOSIS — I959 Hypotension, unspecified: Secondary | ICD-10-CM

## 2014-08-09 DIAGNOSIS — E785 Hyperlipidemia, unspecified: Secondary | ICD-10-CM | POA: Diagnosis not present

## 2014-08-09 DIAGNOSIS — I2510 Atherosclerotic heart disease of native coronary artery without angina pectoris: Secondary | ICD-10-CM | POA: Diagnosis not present

## 2014-08-09 HISTORY — DX: Atherosclerotic heart disease of native coronary artery without angina pectoris: I25.10

## 2014-08-09 HISTORY — DX: Acute myocardial infarction, unspecified: I21.9

## 2014-08-09 HISTORY — DX: Type 2 diabetes mellitus with diabetic neuropathy, unspecified: E11.40

## 2014-08-09 HISTORY — DX: Gastro-esophageal reflux disease without esophagitis: K21.9

## 2014-08-09 HISTORY — DX: Type 2 diabetes mellitus with unspecified diabetic retinopathy without macular edema: E11.319

## 2014-08-09 HISTORY — DX: Cerebral infarction, unspecified: I63.9

## 2014-08-09 HISTORY — DX: Type 2 diabetes mellitus without complications: E11.9

## 2014-08-09 HISTORY — DX: Essential (primary) hypertension: I10

## 2014-08-09 HISTORY — DX: Hyperlipidemia, unspecified: E78.5

## 2014-08-09 LAB — CBC WITH DIFFERENTIAL/PLATELET
Basophils Absolute: 0 10*3/uL (ref 0.0–0.1)
Basophils Relative: 0 % (ref 0–1)
Eosinophils Absolute: 0.2 10*3/uL (ref 0.0–0.7)
Eosinophils Relative: 2 % (ref 0–5)
HEMATOCRIT: 36.6 % — AB (ref 39.0–52.0)
Hemoglobin: 12.5 g/dL — ABNORMAL LOW (ref 13.0–17.0)
Lymphocytes Relative: 18 % (ref 12–46)
Lymphs Abs: 1.8 10*3/uL (ref 0.7–4.0)
MCH: 29.8 pg (ref 26.0–34.0)
MCHC: 34.2 g/dL (ref 30.0–36.0)
MCV: 87.4 fL (ref 78.0–100.0)
MONO ABS: 0.6 10*3/uL (ref 0.1–1.0)
Monocytes Relative: 7 % (ref 3–12)
NEUTROS ABS: 7.2 10*3/uL (ref 1.7–7.7)
NEUTROS PCT: 73 % (ref 43–77)
PLATELETS: 193 10*3/uL (ref 150–400)
RBC: 4.19 MIL/uL — ABNORMAL LOW (ref 4.22–5.81)
RDW: 16.9 % — AB (ref 11.5–15.5)
WBC: 9.8 10*3/uL (ref 4.0–10.5)

## 2014-08-09 LAB — COMPREHENSIVE METABOLIC PANEL
ALK PHOS: 83 U/L (ref 39–117)
ALT: 22 U/L (ref 0–53)
AST: 44 U/L — AB (ref 0–37)
Albumin: 3.7 g/dL (ref 3.5–5.2)
Anion gap: 13 (ref 5–15)
BUN: 38 mg/dL — AB (ref 6–23)
CALCIUM: 9 mg/dL (ref 8.4–10.5)
CHLORIDE: 100 mmol/L (ref 96–112)
CO2: 23 mmol/L (ref 19–32)
Creatinine, Ser: 1.51 mg/dL — ABNORMAL HIGH (ref 0.50–1.35)
GFR calc Af Amer: 56 mL/min — ABNORMAL LOW (ref >90)
GFR calc non Af Amer: 49 mL/min — ABNORMAL LOW (ref >90)
Glucose, Bld: 330 mg/dL — ABNORMAL HIGH (ref 70–99)
Potassium: 3.9 mmol/L (ref 3.5–5.1)
SODIUM: 136 mmol/L (ref 135–145)
Total Bilirubin: 1.1 mg/dL (ref 0.3–1.2)
Total Protein: 7.3 g/dL (ref 6.0–8.3)

## 2014-08-09 LAB — I-STAT CHEM 8, ED
BUN: 41 mg/dL — AB (ref 6–23)
Calcium, Ion: 1.12 mmol/L — ABNORMAL LOW (ref 1.13–1.30)
Chloride: 98 mmol/L (ref 96–112)
Creatinine, Ser: 1.5 mg/dL — ABNORMAL HIGH (ref 0.50–1.35)
Glucose, Bld: 362 mg/dL — ABNORMAL HIGH (ref 70–99)
HCT: 37 % — ABNORMAL LOW (ref 39.0–52.0)
HEMOGLOBIN: 12.6 g/dL — AB (ref 13.0–17.0)
POTASSIUM: 4.1 mmol/L (ref 3.5–5.1)
Sodium: 137 mmol/L (ref 135–145)
TCO2: 23 mmol/L (ref 0–100)

## 2014-08-09 LAB — URINE MICROSCOPIC-ADD ON

## 2014-08-09 LAB — RAPID URINE DRUG SCREEN, HOSP PERFORMED
Amphetamines: NOT DETECTED
Barbiturates: NOT DETECTED
Benzodiazepines: NOT DETECTED
COCAINE: NOT DETECTED
Opiates: NOT DETECTED
Tetrahydrocannabinol: NOT DETECTED

## 2014-08-09 LAB — URINALYSIS, ROUTINE W REFLEX MICROSCOPIC
Bilirubin Urine: NEGATIVE
Ketones, ur: NEGATIVE mg/dL
Leukocytes, UA: NEGATIVE
NITRITE: NEGATIVE
PH: 5 (ref 5.0–8.0)
PROTEIN: NEGATIVE mg/dL
SPECIFIC GRAVITY, URINE: 1.019 (ref 1.005–1.030)
Urobilinogen, UA: 0.2 mg/dL (ref 0.0–1.0)

## 2014-08-09 LAB — I-STAT TROPONIN, ED: Troponin i, poc: 0.02 ng/mL (ref 0.00–0.08)

## 2014-08-09 LAB — ETHANOL

## 2014-08-09 LAB — APTT: aPTT: 34 seconds (ref 24–37)

## 2014-08-09 LAB — PROTIME-INR
INR: 1.03 (ref 0.00–1.49)
PROTHROMBIN TIME: 13.7 s (ref 11.6–15.2)

## 2014-08-09 LAB — TROPONIN I: Troponin I: 0.04 ng/mL — ABNORMAL HIGH (ref <0.031)

## 2014-08-09 MED ORDER — SODIUM CHLORIDE 0.9 % IV SOLN
INTRAVENOUS | Status: DC
  Administered 2014-08-09: 1000 mL via INTRAVENOUS
  Administered 2014-08-10 – 2014-08-11 (×2): via INTRAVENOUS

## 2014-08-09 NOTE — ED Notes (Signed)
He is c/o pain all over his body

## 2014-08-09 NOTE — ED Notes (Signed)
The pt is c/o not being able to walk since yesterday with weakness and being unable to stand. Yesterday am ems came to his house and they helped him back to bed but the pt did not want to come to the hospital.  He feels like his speech was different but better now. But both sides oif his face is still numb today.  Hx of a previous stroke.  The pt deniues drinking alcohol

## 2014-08-09 NOTE — ED Provider Notes (Signed)
CSN: [id number]     Arrival [id number] & time 08/09/14  2029 History   First MD Initiated Contact with Patient 08/09/14 2147     Chief Complaint  Patient presents with  . stroke symptoms       HPI  Pt was seen at 2155. Per pt, c/o gradual onset and persistence of constant "garbled speech" since last night. Pt also states he has been "unable to stand" and "off balance" when trying to walk since yesterday. Pt states he has felt generally "weak" and fallen several times since yesterday. EMS has had to come to the pt's house to pick him up off the floor several times since yesterday. Denies syncope, no focal motor weakness, no tingling/numbness in extremities, no CP/palipations, no SOB/cough, no abd pain, no N/V/D, no back pain, no fevers.    Past Medical History  Diagnosis Date  . Stroke   . Coronary artery disease   . Diabetes mellitus without complication   . Hypertension   . MI (myocardial infarction)   . Hyperlipidemia   . GERD (gastroesophageal reflux disease)   . Diabetic neuropathy   . Diabetic retinopathy    Past Surgical History  Procedure Laterality Date  . Below knee leg amputation Left   . Appendectomy    . Percutaneous placement intravascular stent cervical carotid artery Right     History  Substance Use Topics  . Smoking status: Former Games developermoker  . Smokeless tobacco: Not on file  . Alcohol Use: No    Review of Systems ROS: Statement: All systems negative except as marked or noted in the HPI; Constitutional: Negative for fever and chills. ; ; Eyes: Negative for eye pain, redness and discharge. ; ; ENMT: Negative for ear pain, hoarseness, nasal congestion, sinus pressure and sore throat. ; ; Cardiovascular: Negative for chest pain, palpitations, diaphoresis, dyspnea and peripheral edema. ; ; Respiratory: Negative for cough, wheezing and stridor. ; ; Gastrointestinal: Negative for nausea, vomiting, diarrhea, abdominal pain, blood in stool, hematemesis, jaundice and rectal  bleeding. . ; ; Genitourinary: Negative for dysuria, flank pain and hematuria. ; ; Musculoskeletal: Negative for back pain and neck pain. Negative for swelling and trauma.; ; Skin: Negative for pruritus, rash, abrasions, blisters, bruising and skin lesion.; ; Neuro: +slurred speech, ataxia, generalized weakness. Negative for headache, lightheadedness and neck stiffness. Negative for altered level of consciousness , altered mental status, extremity weakness, paresthesias, involuntary movement, seizure and syncope.     Allergies  Review of patient's allergies indicates not on file.  Home Medications   Prior to Admission medications   Not on File   BP 122/49 mmHg  Pulse 90  Temp(Src) 98.4 F (36.9 C) (Oral)  Resp 16  Ht 5\' 8"  (1.727 m)  Wt 235 lb (106.595 kg)  BMI 35.74 kg/m2  SpO2 99%    23:00:47 Orthostatic Vital Signs CK  Orthostatic Lying  - BP- Lying: 98/54 mmHg ; Pulse- Lying: 79  Orthostatic Sitting - BP- Sitting: 116/82 mmHg ; Pulse- Sitting: 93  Orthostatic Standing at 0 minutes - BP- Standing at 0 minutes: 118/80 mmHg ; Pulse- Standing at 0 minutes: 87    Physical Exam  2200: Physical examination:  Nursing notes reviewed; Vital signs and O2 SAT reviewed;  Constitutional: Well developed, Well nourished, Well hydrated, In no acute distress; Head:  Normocephalic, atraumatic; Eyes: EOMI, PERRL, No scleral icterus; ENMT: Mouth and pharynx normal, Mucous membranes moist; Neck: Supple, Full range of motion, No lymphadenopathy; Cardiovascular: Regular rate and rhythm, No gallop;  Respiratory: Breath sounds clear & equal bilaterally, No wheezes.  Speaking full sentences with ease, Normal respiratory effort/excursion; Chest: Nontender, Movement normal; Abdomen: Soft, Nontender, Nondistended, Normal bowel sounds; Genitourinary: No CVA tenderness; Extremities: Pulses normal, +left BKA. No tenderness, No edema.; Neuro: AA&Ox3, Major CN grossly intact. Speech slurred.  No facial droop.  No  nystagmus. Grips equal. Strength 5/5 equal bilat UE's and LE's.  DTR 2/4 equal bilat UE's and LE's.  No gross sensory deficits.  Normal cerebellar testing bilat UE's (finger-nose) and LE's (heel-shin)..; Skin: Color normal, Warm, Dry.   ED Course  Procedures      EKG Interpretation   Date/Time:  Saturday August 09 2014 20:38:30 EST Ventricular Rate:  95 PR Interval:  230 QRS Duration: 84 QT Interval:  368 QTC Calculation: 462 R Axis:   1 Text Interpretation:  Sinus rhythm with 1st degree A-V block Possible Left  atrial enlargement Septal infarct , age undetermined Artifact When  compared with ECG of 01/30/2008 No significant change was found Confirmed  by [facility]  MD, Nicholos Johns [phone]) on 08/09/2014 10:17:51 PM      MDM  MDM Reviewed: nursing note, previous chart and vitals Reviewed previous: labs and ECG Interpretation: labs, ECG, x-ray and CT scan     Results for orders placed or performed during the hospital encounter of 08/09/14  CBC with Differential  Result Value Ref Range   WBC 9.8 4.0 - 10.5 K/uL   RBC 4.19 (L) 4.22 - 5.81 MIL/uL   Hemoglobin 12.5 (L) 13.0 - 17.0 g/dL   HCT 19.1 (L) 47.8 - 29.5 %   MCV 87.4 78.0 - 100.0 fL   MCH 29.8 26.0 - 34.0 pg   MCHC 34.2 30.0 - 36.0 g/dL   RDW 62.1 (H) 30.8 - 65.7 %   Platelets 193 150 - 400 K/uL   Neutrophils Relative % 73 43 - 77 %   Neutro Abs 7.2 1.7 - 7.7 K/uL   Lymphocytes Relative 18 12 - 46 %   Lymphs Abs 1.8 0.7 - 4.0 K/uL   Monocytes Relative 7 3 - 12 %   Monocytes Absolute 0.6 0.1 - 1.0 K/uL   Eosinophils Relative 2 0 - 5 %   Eosinophils Absolute 0.2 0.0 - 0.7 K/uL   Basophils Relative 0 0 - 1 %   Basophils Absolute 0.0 0.0 - 0.1 K/uL  Comprehensive metabolic panel  Result Value Ref Range   Sodium 136 135 - 145 mmol/L   Potassium 3.9 3.5 - 5.1 mmol/L   Chloride 100 96 - 112 mmol/L   CO2 23 19 - 32 mmol/L   Glucose, Bld 330 (H) 70 - 99 mg/dL   BUN 38 (H) 6 - 23 mg/dL   Creatinine, Ser 8.46 (H) 0.50  - 1.35 mg/dL   Calcium 9.0 8.4 - 96.2 mg/dL   Total Protein 7.3 6.0 - 8.3 g/dL   Albumin 3.7 3.5 - 5.2 g/dL   AST 44 (H) 0 - 37 U/L   ALT 22 0 - 53 U/L   Alkaline Phosphatase 83 39 - 117 U/L   Total Bilirubin 1.1 0.3 - 1.2 mg/dL   GFR calc non Af Amer 49 (L) >90 mL/min   GFR calc Af Amer 56 (L) >90 mL/min   Anion gap 13 5 - 15  Protime-INR  Result Value Ref Range   Prothrombin Time 13.7 11.6 - 15.2 seconds   INR 1.03 0.00 - 1.49  Troponin I  Result Value Ref Range   Troponin I  0.04 (H) <0.031 ng/mL  Ethanol  Result Value Ref Range   Alcohol, Ethyl (B) <5 0 - 9 mg/dL  APTT  Result Value Ref Range   aPTT 34 24 - 37 seconds  Urine Drug Screen  Result Value Ref Range   Opiates NONE DETECTED NONE DETECTED   Cocaine NONE DETECTED NONE DETECTED   Benzodiazepines NONE DETECTED NONE DETECTED   Amphetamines NONE DETECTED NONE DETECTED   Tetrahydrocannabinol NONE DETECTED NONE DETECTED   Barbiturates NONE DETECTED NONE DETECTED  Urinalysis, Routine w reflex microscopic  Result Value Ref Range   Color, Urine YELLOW YELLOW   APPearance CLEAR CLEAR   Specific Gravity, Urine 1.019 1.005 - 1.030   pH 5.0 5.0 - 8.0   Glucose, UA >1000 (A) NEGATIVE mg/dL   Hgb urine dipstick TRACE (A) NEGATIVE   Bilirubin Urine NEGATIVE NEGATIVE   Ketones, ur NEGATIVE NEGATIVE mg/dL   Protein, ur NEGATIVE NEGATIVE mg/dL   Urobilinogen, UA 0.2 0.0 - 1.0 mg/dL   Nitrite NEGATIVE NEGATIVE   Leukocytes, UA NEGATIVE NEGATIVE  Urine microscopic-add on  Result Value Ref Range   WBC, UA 0-2 <3 WBC/hpf   RBC / HPF 0-2 <3 RBC/hpf  I-Stat Chem 8, ED  Result Value Ref Range   Sodium 137 135 - 145 mmol/L   Potassium 4.1 3.5 - 5.1 mmol/L   Chloride 98 96 - 112 mmol/L   BUN 41 (H) 6 - 23 mg/dL   Creatinine, Ser 1.61 (H) 0.50 - 1.35 mg/dL   Glucose, Bld 096 (H) 70 - 99 mg/dL   Calcium, Ion 0.45 (L) 1.13 - 1.30 mmol/L   TCO2 23 0 - 100 mmol/L   Hemoglobin 12.6 (L) 13.0 - 17.0 g/dL   HCT 40.9 (L) 81.1 - 91.4  %  I-Stat Troponin, ED (not at [facility])  Result Value Ref Range   Troponin i, poc 0.02 0.00 - 0.08 ng/mL   Comment 3           Dg Chest 2 View 08/09/2014   CLINICAL DATA:  Unable to walk since yesterday. Weakness and unable to stand. Speech difficulty yesterday that better now. Face is non today. History of prior stroke, MI, CABG, coronary artery disease, diabetes.  EXAM: CHEST  2 VIEW  COMPARISON:  03/18/2008  FINDINGS: Postoperative changes in the mediastinum. Normal heart size and pulmonary vascularity. No focal airspace disease or consolidation in the lungs. No blunting of costophrenic angles. No pneumothorax. Mediastinal contours appear intact. Degenerative changes in the spine and shoulders.  IMPRESSION: No active cardiopulmonary disease.   Electronically Signed   By: Burman Nieves M.D.   On: 08/09/2014 22:45   Ct Head Wo Contrast 08/09/2014   CLINICAL DATA:  Weakness and numbness  EXAM: CT HEAD WITHOUT CONTRAST  TECHNIQUE: Contiguous axial images were obtained from the base of the skull through the vertex without intravenous contrast.  COMPARISON:  06/14/2007  FINDINGS: There is no intracranial hemorrhage, mass or evidence of acute infarction. There is old infarction with encephalomalacia in the left centrum semiovale. There is old infarction in the pons, right greater than left. These are unchanged from 2008. Additional smaller old infarctions are present in the periventricular frontal white matter, right greater than left. There is mild generalized atrophy and mild chronic microvascular ischemic disease. There is no extra-axial fluid collection. Calvarium and skullbase are intact.  IMPRESSION: Mild atrophy and chronic microvascular ischemic disease. 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 No acute findings are evident.   Electronically Signed  By: Ellery Plunk M.D.   On: 08/09/2014 22:30    Results for QUAYSHAWN, NIN (MRN [id number]) as of 08/09/2014 23:30  Ref. Range 02/10/2008 04:15  02/12/2008 05:25 08/09/2014 20:36  BUN Latest Range: 6-23 mg/dL 18 15 38 (H)  Creatinine Latest Range: 0.50-1.35 mg/dL 4.09 8.11 9.14 (H)    7829:  BUN/Cr elevated from baseline; will dose judicious IVF. Unable to stand/walk due to generalized weakness. Speech is slurred, neuro exam otherwise intact. Will need MRI r/o CVA. Will admit.  T/C to [facility]. Criselda Peaches, case discussed, including:  HPI, pertinent PM/SHx, VS/PE, dx testing, ED course and treatment:  Agreeable to admit, requests to write temporary orders, obtain tele bed to team [facility].   Samuel Jester, DO 08/12/14 1536

## 2014-08-09 NOTE — ED Notes (Signed)
Internal medicine MD at bedside

## 2014-08-09 NOTE — ED Notes (Signed)
MD McManus at bedside. 

## 2014-08-10 ENCOUNTER — Observation Stay (HOSPITAL_COMMUNITY): Payer: Medicare HMO

## 2014-08-10 DIAGNOSIS — N289 Disorder of kidney and ureter, unspecified: Secondary | ICD-10-CM | POA: Diagnosis not present

## 2014-08-10 DIAGNOSIS — E785 Hyperlipidemia, unspecified: Secondary | ICD-10-CM | POA: Diagnosis not present

## 2014-08-10 DIAGNOSIS — K219 Gastro-esophageal reflux disease without esophagitis: Secondary | ICD-10-CM | POA: Diagnosis not present

## 2014-08-10 DIAGNOSIS — Z8673 Personal history of transient ischemic attack (TIA), and cerebral infarction without residual deficits: Secondary | ICD-10-CM

## 2014-08-10 DIAGNOSIS — E1142 Type 2 diabetes mellitus with diabetic polyneuropathy: Secondary | ICD-10-CM | POA: Diagnosis not present

## 2014-08-10 DIAGNOSIS — G629 Polyneuropathy, unspecified: Secondary | ICD-10-CM | POA: Diagnosis not present

## 2014-08-10 DIAGNOSIS — R4781 Slurred speech: Secondary | ICD-10-CM | POA: Diagnosis not present

## 2014-08-10 DIAGNOSIS — I959 Hypotension, unspecified: Secondary | ICD-10-CM | POA: Insufficient documentation

## 2014-08-10 DIAGNOSIS — I2510 Atherosclerotic heart disease of native coronary artery without angina pectoris: Secondary | ICD-10-CM | POA: Diagnosis present

## 2014-08-10 LAB — RAPID URINE DRUG SCREEN, HOSP PERFORMED
AMPHETAMINES: NOT DETECTED
BARBITURATES: NOT DETECTED
BENZODIAZEPINES: NOT DETECTED
Cocaine: NOT DETECTED
Opiates: POSITIVE — AB
TETRAHYDROCANNABINOL: NOT DETECTED

## 2014-08-10 LAB — URINALYSIS, ROUTINE W REFLEX MICROSCOPIC
Bilirubin Urine: NEGATIVE
Glucose, UA: >1000 mg/dL — AB
Hgb urine dipstick: NEGATIVE
KETONES UR: NEGATIVE mg/dL
Leukocytes, UA: NEGATIVE
NITRITE: NEGATIVE
PH: 5 (ref 5.0–8.0)
Protein, ur: NEGATIVE mg/dL
SPECIFIC GRAVITY, URINE: 1.015 (ref 1.005–1.030)
UROBILINOGEN UA: 0.2 mg/dL (ref 0.0–1.0)

## 2014-08-10 LAB — URINE MICROSCOPIC-ADD ON

## 2014-08-10 LAB — GLUCOSE, CAPILLARY
GLUCOSE-CAPILLARY: 197 mg/dL — AB (ref 70–99)
GLUCOSE-CAPILLARY: 258 mg/dL — AB (ref 70–99)
Glucose-Capillary: 266 mg/dL — ABNORMAL HIGH (ref 70–99)
Glucose-Capillary: 249 mg/dL — ABNORMAL HIGH (ref 70–99)
Glucose-Capillary: 295 mg/dL — ABNORMAL HIGH (ref 70–99)

## 2014-08-10 LAB — CBC
HCT: 33.5 % — ABNORMAL LOW (ref 39.0–52.0)
Hemoglobin: 11.2 g/dL — ABNORMAL LOW (ref 13.0–17.0)
MCH: 29.8 pg (ref 26.0–34.0)
MCHC: 33.4 g/dL (ref 30.0–36.0)
MCV: 89.1 fL (ref 78.0–100.0)
Platelets: 153 10*3/uL (ref 150–400)
RBC: 3.76 MIL/uL — ABNORMAL LOW (ref 4.22–5.81)
RDW: 16.9 % — ABNORMAL HIGH (ref 11.5–15.5)
WBC: 7.3 10*3/uL (ref 4.0–10.5)

## 2014-08-10 LAB — CREATININE, SERUM
Creatinine, Ser: 1.5 mg/dL — ABNORMAL HIGH (ref 0.50–1.35)
GFR calc non Af Amer: 49 mL/min — ABNORMAL LOW (ref >90)
GFR, EST AFRICAN AMERICAN: 57 mL/min — AB (ref >90)

## 2014-08-10 LAB — LIPID PANEL
CHOLESTEROL: 128 mg/dL (ref 0–200)
HDL: 21 mg/dL — ABNORMAL LOW (ref >39)
LDL Cholesterol: 59 mg/dL (ref 0–99)
TRIGLYCERIDES: 238 mg/dL — AB (ref <150)
Total CHOL/HDL Ratio: 6.1 RATIO
VLDL: 48 mg/dL — ABNORMAL HIGH (ref 0–40)

## 2014-08-10 LAB — TROPONIN I
TROPONIN I: 0.04 ng/mL — AB (ref <0.031)
Troponin I: 0.04 ng/mL — ABNORMAL HIGH (ref <0.031)
Troponin I: 0.03 ng/mL (ref <0.031)

## 2014-08-10 LAB — SODIUM, URINE, RANDOM: SODIUM UR: 70 mmol/L

## 2014-08-10 LAB — OSMOLALITY, URINE: OSMOLALITY UR: 423 mosm/kg (ref 390–1090)

## 2014-08-10 LAB — OSMOLALITY: OSMOLALITY: 307 mosm/kg — AB (ref 275–300)

## 2014-08-10 MED ORDER — VITAMIN D 1000 UNITS PO TABS
2000.0000 [IU] | ORAL_TABLET | Freq: Two times a day (BID) | ORAL | Status: DC
  Administered 2014-08-10 – 2014-08-15 (×11): 2000 [IU] via ORAL
  Filled 2014-08-10 (×13): qty 2

## 2014-08-10 MED ORDER — INSULIN GLARGINE 100 UNIT/ML ~~LOC~~ SOLN
30.0000 [IU] | Freq: Every day | SUBCUTANEOUS | Status: DC
  Administered 2014-08-10 – 2014-08-15 (×6): 30 [IU] via SUBCUTANEOUS
  Filled 2014-08-10 (×6): qty 0.3

## 2014-08-10 MED ORDER — RESOURCE THICKENUP CLEAR PO POWD
ORAL | Status: DC | PRN
  Filled 2014-08-10: qty 125

## 2014-08-10 MED ORDER — INSULIN ASPART 100 UNIT/ML ~~LOC~~ SOLN
0.0000 [IU] | SUBCUTANEOUS | Status: DC
  Administered 2014-08-10: 3 [IU] via SUBCUTANEOUS
  Administered 2014-08-10: 8 [IU] via SUBCUTANEOUS
  Administered 2014-08-10: 5 [IU] via SUBCUTANEOUS
  Administered 2014-08-10 (×2): 8 [IU] via SUBCUTANEOUS
  Administered 2014-08-11: 2 [IU] via SUBCUTANEOUS
  Administered 2014-08-11 (×2): 5 [IU] via SUBCUTANEOUS
  Administered 2014-08-11: 15 [IU] via SUBCUTANEOUS
  Administered 2014-08-11: 8 [IU] via SUBCUTANEOUS
  Administered 2014-08-11: 2 [IU] via SUBCUTANEOUS
  Administered 2014-08-11: 3 [IU] via SUBCUTANEOUS
  Administered 2014-08-12: 8 [IU] via SUBCUTANEOUS
  Administered 2014-08-12: 3 [IU] via SUBCUTANEOUS
  Administered 2014-08-12: 5 [IU] via SUBCUTANEOUS
  Administered 2014-08-13: 8 [IU] via SUBCUTANEOUS
  Administered 2014-08-13: 11 [IU] via SUBCUTANEOUS
  Administered 2014-08-13: 3 [IU] via SUBCUTANEOUS
  Administered 2014-08-13 (×2): 2 [IU] via SUBCUTANEOUS
  Administered 2014-08-14: 3 [IU] via SUBCUTANEOUS
  Administered 2014-08-14: 5 [IU] via SUBCUTANEOUS
  Administered 2014-08-14 – 2014-08-15 (×6): 3 [IU] via SUBCUTANEOUS
  Administered 2014-08-15: 2 [IU] via SUBCUTANEOUS

## 2014-08-10 MED ORDER — HEPARIN SODIUM (PORCINE) 5000 UNIT/ML IJ SOLN
5000.0000 [IU] | Freq: Three times a day (TID) | INTRAMUSCULAR | Status: DC
  Administered 2014-08-10 – 2014-08-15 (×15): 5000 [IU] via SUBCUTANEOUS
  Filled 2014-08-10 (×15): qty 1

## 2014-08-10 MED ORDER — STROKE: EARLY STAGES OF RECOVERY BOOK
Freq: Once | Status: AC
  Administered 2014-08-10: 02:00:00

## 2014-08-10 MED ORDER — CARVEDILOL 3.125 MG PO TABS
3.1250 mg | ORAL_TABLET | Freq: Two times a day (BID) | ORAL | Status: DC
  Administered 2014-08-10 – 2014-08-15 (×9): 3.125 mg via ORAL
  Filled 2014-08-10 (×10): qty 1

## 2014-08-10 MED ORDER — ACETAMINOPHEN 325 MG PO TABS: 650.0000 mg | ORAL_TABLET | ORAL | Status: DC | PRN

## 2014-08-10 MED ORDER — SODIUM CHLORIDE 0.9 % IV SOLN: INTRAVENOUS | Status: AC

## 2014-08-10 MED ORDER — GABAPENTIN 300 MG PO CAPS
300.0000 mg | ORAL_CAPSULE | Freq: Four times a day (QID) | ORAL | Status: DC
Start: 2014-08-10 — End: 2014-08-12
  Administered 2014-08-10 – 2014-08-11 (×7): 300 mg via ORAL
  Filled 2014-08-10 (×7): qty 1

## 2014-08-10 MED ORDER — ATORVASTATIN CALCIUM 40 MG PO TABS
40.0000 mg | ORAL_TABLET | Freq: Every day | ORAL | Status: DC
  Administered 2014-08-10 – 2014-08-15 (×6): 40 mg via ORAL
  Filled 2014-08-10 (×6): qty 1

## 2014-08-10 MED ORDER — FUROSEMIDE 40 MG PO TABS
40.0000 mg | ORAL_TABLET | Freq: Every day | ORAL | Status: DC
  Administered 2014-08-10: 40 mg via ORAL
  Filled 2014-08-10: qty 1

## 2014-08-10 MED ORDER — ASPIRIN 325 MG PO TABS
325.0000 mg | ORAL_TABLET | Freq: Every day | ORAL | Status: DC
  Administered 2014-08-10 – 2014-08-15 (×6): 325 mg via ORAL
  Filled 2014-08-10 (×6): qty 1

## 2014-08-10 NOTE — Progress Notes (Signed)
Patient Demographics  Clayton [name], is a 59 y.o. male, DOB - May 13, 1956, ZOX:[id number]  Admit date - 08/09/2014   Admitting Physician Inez Catalina, MD  Outpatient Primary MD for the patient is No primary care provider on file.  LOS - 1   Chief Complaint  Patient presents with  . stroke symptoms         Subjective:   Camran Keady today has, No headache, No chest pain, No abdominal pain - No Nausea, No new weakness tingling or numbness, No Cough - SOB. Resolved weakness and speech problems  Assessment & Plan    1. Slurred speech with some generalized nonfocal weakness. 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 MRA brain suggests severe bilateral carotid artery disease. To be seen by PT OT speech, A1c, echogram pending. LDL under 70, on aspirin statin continue.  Lab Results  Component Value Date   HGBA1C * 01/26/2008    6.3 (NOTE)   The ADA recommends the following therapeutic goal for glycemic   control related to Hgb A1C measurement:   Goal of Therapy:   < 7.0% Hgb A1C   Reference: American Diabetes Association: Clinical Practice   Recommendations 2008, Diabetes Care,  2008, 31:(Suppl 1).     2. Bilateral carotid artery disease on MRA. Continue aspirin and statin for secondary prevention, have requested vascular to evaluate. Carotid duplex pending.   3. Renal failure. Last creatinine in the system is 0.9 but it is 61 years old. Recent labs not in the system, currently being hydrated, will obtain UA with heated electrolytes, repeat BMP in the morning. Avoid nephrotoxins for now. Discontinue Lasix.   4. CAD with Borderline Trop - pain and symptom free, EKG non acute, on ASA- statin, check Echo , add low dose Coreg. Appointment trend is  non-ACS pattern. Likely minimal elevation due to renal insufficiency. Will need outpatient cardiology follow-up.   5.DM-2 - will add low-dose Lantus for better control, continue sliding scale.  Lab Results  Component Value Date   HGBA1C * 01/26/2008    6.3 (NOTE)   The ADA recommends the following therapeutic goal for glycemic   control related to Hgb A1C measurement:   Goal of Therapy:   < 7.0% Hgb A1C   Reference: American Diabetes Association: Clinical Practice   Recommendations 2008, Diabetes Care,  2008, 31:(Suppl 1).    CBG (last 3)   Recent Labs  08/10/14 0140 08/10/14 0547 08/10/14 1154  GLUCAP 266* 197* 258*    6. Diabetic neuropathy. Continue Neurontin.   7. Dyslipidemia. On statin. LDL less than 70.  Code Status: Full  Family Communication: None  Disposition Plan: Home   Procedures   CT head, MRI/MRA brain, carotid duplex, echogram   Consults      Medications  Scheduled Meds: . sodium chloride   Intravenous STAT  . aspirin  325 mg Oral Daily  . atorvastatin  40 mg Oral Daily  . cholecalciferol  2,000 Units Oral BID  . furosemide  40 mg Oral Daily  . gabapentin  300 mg Oral QID  . heparin  5,000 Units Subcutaneous 3 times per day  . insulin aspart  0-15 Units Subcutaneous 6 times per day  Continuous Infusions: . sodium chloride 1,000 mL (08/09/14 2345)   PRN Meds:.acetaminophen, RESOURCE THICKENUP CLEAR  DVT Prophylaxis  Heparin   Lab Results  Component Value Date   PLT 153 08/10/2014    Antibiotics    Anti-infectives    None          Objective:   Filed Vitals:   08/10/14 0148 08/10/14 0400 08/10/14 0600 08/10/14 0800  BP: 110/60 101/50 110/58 97/56  Pulse: 83 77 78 76  Temp: 97.9 F (36.6 C) 98.2 F (36.8 C) 98.1 F (36.7 C) 98.2 F (36.8 C)  TempSrc: Oral Oral Oral Oral  Resp: Height:  (1.727 m)     Weight: 103 kg (227 lb 1.2 oz)     SpO2: 96% 95% 95% 95%    Wt Readings from Last 3  Encounters:  08/10/14 103 kg (227 lb 1.2 oz)     Intake/Output Summary (Last 24 hours) at 08/10/14 1319 Last data filed at 08/10/14 0916  Gross per 24 hour  Intake      0 ml  Output    200 ml  Net   -200 ml     Physical Exam  Awake Alert, Oriented X 3, No new F.N deficits, Normal affect [place].AT,PERRAL Supple Neck,No JVD, No cervical lymphadenopathy appriciated.  Symmetrical Chest wall movement, Good air movement bilaterally, CTAB RRR,No Gallops,Rubs or new Murmurs, No Parasternal Heave +ve B.Sounds, Abd Soft, No tenderness, No organomegaly appriciated, No rebound - guarding or rigidity. No Cyanosis, Clubbing or edema, No new Rash or bruise , L BKA, R transmetatarsal amputation.   Data Review   Micro Results No results found for this or any previous visit (from the past 240 hour(s)).  Radiology Reports Dg Chest 2 View  08/09/2014   CLINICAL DATA:  Unable to walk since yesterday. Weakness and unable to stand. Speech difficulty yesterday that better now. Face is non today. History of prior stroke, MI, CABG, coronary artery disease, diabetes.  EXAM: CHEST  2 VIEW  COMPARISON:  03/18/2008  FINDINGS: Postoperative changes in the mediastinum. Normal heart size and pulmonary vascularity. No focal airspace disease or consolidation in the lungs. No blunting of costophrenic angles. No pneumothorax. Mediastinal contours appear intact. Degenerative changes in the spine and shoulders.  IMPRESSION: No active cardiopulmonary disease.   Electronically Signed   By: Burman Nieves M.D.   On: 08/09/2014 22:45   Ct Head Wo Contrast  08/09/2014   CLINICAL DATA:  Weakness and numbness  EXAM: CT HEAD WITHOUT CONTRAST  TECHNIQUE: Contiguous axial images were obtained from the base of the skull through the vertex without intravenous contrast.  COMPARISON:  06/14/2007  FINDINGS: There is no intracranial hemorrhage, mass or evidence of acute infarction. There is old infarction with encephalomalacia in the  left centrum semiovale. There is old infarction in the pons, right greater than left. These are unchanged from 2008. Additional smaller old infarctions are present in the periventricular frontal white matter, right greater than left. There is mild generalized atrophy and mild chronic microvascular ischemic disease. There is no extra-axial fluid collection. Calvarium and skullbase are intact.  IMPRESSION: Mild atrophy and chronic microvascular ischemic disease. 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 No acute findings are evident.   Electronically Signed   By: Ellery Plunk M.D.   On: 08/09/2014 22:30   Mri Brain Without Contrast  08/10/2014   CLINICAL DATA:  Personal previous history of stroke and vascular disease. Acute presentation with slurred  speech and weakness. Symptoms began last night.  EXAM: MRI HEAD WITHOUT CONTRAST  TECHNIQUE: Multiplanar, multiecho pulse sequences of the brain and surrounding structures were obtained without intravenous contrast.  COMPARISON:  Head CT 08/09/2014 in 06/14/2007.  MRI 01/17/2006.  FINDINGS: Diffusion imaging does not show any acute or subacute infarction. There is extensive old infarction throughout the pons bilaterally. There are old small vessel cerebellar infarctions in their cerebellar atrophy. There are old infarctions in the left basal ganglia and radiating white matter tracts and moderate chronic small-vessel ischemic changes throughout the cerebral hemispheric white matter. No cortical infarction is seen within the cerebral hemispheres. No neoplastic mass lesion, hemorrhage, hydrocephalus or extra-axial collection. No pituitary mass. No inflammatory sinus disease. No skull or skullbase lesion. Major vessels at the base of the brain show flow.  IMPRESSION: Advanced chronic ischemic changes affecting the pons and remainder the brainstem. Old deep brain infarctions including the left basal ganglia up. Chronic small vessel disease. No acute or subacute  infarction or other reversible process.   Electronically Signed   By: Paulina FusiMark  Shogry M.D.   On: 08/10/2014 07:34   Mr Maxine GlennMra Head/brain Wo Cm  08/10/2014   CLINICAL DATA:  Widespread cardiovascular disease. Slurred speech and weakness. Chronic ischemic changes of the brain particularly in the pons.  EXAM: MRA HEAD WITHOUT CONTRAST  TECHNIQUE: Angiographic images of the Circle of Willis were obtained using MRA technique without intravenous contrast.  COMPARISON:  MRI brain same day.  Angiography 06/14/2007.  FINDINGS: Severe stenosis of the right internal carotid artery at the skullbase is again demonstrated. At the proximal carotid canal, there is a 50% diameter stenosis. At the proximal siphon, the lumen is reduced to 1 mm or less, but antegrade flow does persist. Beyond that, there is pronounced atherosclerotic irregularity in the siphon region. At the distal siphon/ supra clinoid internal carotid artery, there is severe stenosis and irregularity with luminal diameter of 1 mm. No flow is seen in the right anterior cerebral artery. Right middle cerebral artery is patent without proximal stenosis.  The left internal carotid artery is widely patent through the carotid canal. There is advanced atherosclerotic disease in the siphon region with narrowing and irregularity particularly in the proximal siphon. Stenosis in that region is estimated at 50-70%. Pronounced irregularity is seen beyond that. Narrowing of the distal siphon is approximately 30%. The anterior and middle cerebral vessels are patent, with flow from this site to supplying both anterior cerebral arteries. There is no proximal left middle cerebral artery stenosis.  Both vertebral arteries are widely patent through the foramen magnum to the basilar. The right is larger than the left. No basilar stenosis. Flow is demonstrated in a left posterior inferior cerebellar artery, bilateral anterior inferior cerebellar arteries, bilateral superior cerebellar  arteries, and both posterior cerebral arteries. Medium to small branch vessels show atherosclerotic narrowing and irregularity.  IMPRESSION: Severe extracranial stenotic disease of the right internal carotid artery. 50% stenosis of the proximal right carotid canal. Very severe stenosis at the proximal right siphon with luminal diameter 1 mm or less. Severe stenosis and irregularity throughout the right carotid siphon beyond that.  Moderate to severe atherosclerotic disease in the left carotid siphon. Pronounced irregularity of the vessel, but with maximal stenosis estimated at 50-70%.  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  No correctable proximal MCA stenosis.  Distal vessel atherosclerotic narrowing and irregularity, particularly evident in the posterior circulation  branches.   Electronically Signed   By: Paulina Fusi M.D.   On: 08/10/2014 09:31     CBC  Recent Labs Lab 08/09/14 2036 08/09/14 2203 08/10/14 0400  WBC 9.8  --  7.3  HGB 12.5* 12.6* 11.2*  HCT 36.6* 37.0* 33.5*  PLT 193  --  153  MCV 87.4  --  89.1  MCH 29.8  --  29.8  MCHC 34.2  --  33.4  RDW 16.9*  --  16.9*  LYMPHSABS 1.8  --   --   MONOABS 0.6  --   --   EOSABS 0.2  --   --   BASOSABS 0.0  --   --     Chemistries   Recent Labs Lab 08/09/14 2036 08/09/14 2203 08/10/14 0400  NA 136 137  --   K 3.9 4.1  --   CL 100 98  --   CO2 23  --   --   GLUCOSE 330* 362*  --   BUN 38* 41*  --   CREATININE 1.51* 1.50* 1.50*  CALCIUM 9.0  --   --   AST 44*  --   --   ALT 22  --   --   ALKPHOS 83  --   --   BILITOT 1.1  --   --    ------------------------------------------------------------------------------------------------------------------ estimated creatinine clearance is 60.9 mL/min (by C-G formula based on Cr of  1.5). ------------------------------------------------------------------------------------------------------------------ No results for input(s): HGBA1C in the last 72 hours. ------------------------------------------------------------------------------------------------------------------  Recent Labs  08/10/14 0400  CHOL 128  HDL 21*  LDLCALC 59  TRIG 161*  CHOLHDL 6.1   ------------------------------------------------------------------------------------------------------------------ No results for input(s): TSH, T4TOTAL, T3FREE, THYROIDAB in the last 72 hours.  Invalid input(s): FREET3 ------------------------------------------------------------------------------------------------------------------ No results for input(s): VITAMINB12, FOLATE, FERRITIN, TIBC, IRON, RETICCTPCT in the last 72 hours.  Coagulation profile  Recent Labs Lab 08/09/14 2036  INR 1.03    No results for input(s): DDIMER in the last 72 hours.  Cardiac Enzymes  Recent Labs Lab 08/09/14 2036 08/10/14 0400 08/10/14 0737  TROPONINI 0.04* 0.04* 0.04*   ------------------------------------------------------------------------------------------------------------------ Invalid input(s): POCBNP     Time Spent in minutes  35   SINGH,PRASHANT K M.D on 08/10/2014 at 1:19 PM  Between 7am to 7pm - Pager - [phone]  After 7pm go to [email or web address] - password [facility]  [facility] Office  [phone]

## 2014-08-10 NOTE — Progress Notes (Signed)
Utilization Review Completed.   Gera Inboden, RN, BSN Nurse Case Manager  

## 2014-08-10 NOTE — Progress Notes (Signed)
OT Cancellation Note  Patient Details Name: Ree KidaLarry K Wintle MRN: [id number] DOB: July 25, 1955   Cancelled Treatment:    Reason Eval/Treat Not Completed: Other (comment). Pt on bedrest. Please update activity orders when appropriate for OT evaluation. Thanks.  Earlie RavelingStraub, Hagan Vanauken L OTR/L [phone] 08/10/2014, 11:37 AM

## 2014-08-10 NOTE — Evaluation (Signed)
Clinical/Bedside Swallow Evaluation Patient Details  Name: Clayton [name] MRN: [id number] Date of Birth: 06-Jan-1956  Today's Date: 08/10/2014 Time: SLP Start Time (ACUTE ONLY): 1000 SLP Stop Time (ACUTE ONLY): 1030 SLP Time Calculation (min) (ACUTE ONLY): 30 min  Past Medical History:  Past Medical History  Diagnosis Date  . Stroke   . Coronary artery disease   . Diabetes mellitus without complication   . Hypertension   . MI (myocardial infarction)   . Hyperlipidemia   . GERD (gastroesophageal reflux disease)   . Diabetic neuropathy   . Diabetic retinopathy    Past Surgical History:  Past Surgical History  Procedure Laterality Date  . Below knee leg amputation Left   . Appendectomy    . Percutaneous placement intravascular stent cervical carotid artery Right    HPI:  59 yo man with PMH of stroke, CAD, DM with neuropathy, s/p Left BKA, HTN, GERD, HLD, h/o MI who presents with slurred speech and weakness. Clayton [name] has a long history of neurological symptoms. He had a stroke in 2006 and then in 2007. Since that time he has had residual weakness on the left and speech issues. Jan29 noticed that his speech became more garbled and he also began to feel more unsteady, with worsening weakness next 24 hours. MRI reveals advanced chronic ischemic changes affecting the pons and remainder the brainstem. 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 Chronic small vessel disease. No acute or subacute infarction.     Assessment / Plan / Recommendation Clinical Impression  Pt with hx of remote dysphagia, but now with acute onset recurring dysphagia with + s/s of aspiration when consuming thin liquids (consistent, explosive cough; wet phonation).  Pt attributes cough to having a cold.  Demonstrates mild bilateral facial weakness; reduced tongue extension.  Recommend initiating a dysphagia 3 diet with nectar-thick liquids for now.  If no improvements in airway protection over next 24  hours, may benefit from further instrumental testing.  Pt agrees, reluctantly, to begin a modified diet.     Aspiration Risk  Moderate    Diet Recommendation Dysphagia 3 (Mechanical Soft);Nectar-thick liquid (may have ice chips)   Liquid Administration via: Cup Medication Administration: Whole meds with liquid Supervision: Intermittent supervision to cue for compensatory strategies Compensations: Slow rate;Small sips/bites Postural Changes and/or Swallow Maneuvers: Seated upright 90 degrees    Other  Recommendations Oral Care Recommendations: Oral care BID   Follow Up Recommendations   (tba)    Frequency and Duration min 2x/week  2 weeks     Swallow Study Prior Functional Status       General Date of Onset: 08/08/14 HPI: 59 yo man with PMH of stroke, CAD, DM with neuropathy, s/p Left BKA, HTN, GERD, HLD, h/o MI who presents with slurred speech and weakness. Clayton [name] has a long history of neurological symptoms. He had a stroke in 2006 and then in 2007. Since that time he has had residual weakness on the left and speech issues. Jan29 noticed that his speech became more garbled and he also began to feel more unsteady, with worsening weakness next 24 hours. MRI reveals advanced chronic ischemic changes affecting the pons and remainder the brainstem. 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 Chronic small vessel disease. No acute or subacute infarction.   Type of Study: Bedside swallow evaluation Previous Swallow Assessment: MBS in 2009 Diet Prior to this Study: NPO Temperature Spikes Noted: No Respiratory Status: Room air Oral Cavity - Dentition: Edentulous;Dentures,  bottom Self-Feeding Abilities: Able to feed self Patient Positioning: Upright in bed Baseline Vocal Quality: Wet Volitional Cough: Strong Volitional Swallow: Able to elicit    Oral/Motor/Sensory Function Overall Oral Motor/Sensory Function: Impaired at baseline (reduced bilateral labial  retraction and tongue extension)   Ice Chips Ice chips: Within functional limits   Thin Liquid Thin Liquid: Impaired Presentation: Cup;Straw Oral Phase Impairments: Reduced labial seal Oral Phase Functional Implications: Left anterior spillage Pharyngeal  Phase Impairments: Suspected delayed Swallow;Multiple swallows;Cough - Immediate (explosive coughing)    Nectar Thick Nectar Thick Liquid: Impaired Presentation: Cup;Self Fed Oral Phase Impairments: Reduced labial seal Pharyngeal Phase Impairments: Suspected delayed Swallow   Honey Thick Honey Thick Liquid: Not tested   Puree Puree: Within functional limits Presentation: Self Fed;Spoon   Solid   GO Functional Assessment Tool Used: clinical judgement Functional Limitations: Swallowing Swallow Current Status ([id number]): At least 20 percent but less than 40 percent impaired, limited or restricted Swallow Goal Status [phone]([id number]): At least 1 percent but less than 20 percent impaired, limited or restricted  Solid: Within functional limits Presentation: Self Fed       Clayton [name], Clayton [name] 08/10/2014,10:39 AM

## 2014-08-10 NOTE — ED Notes (Signed)
Transporting patient to new room assignment. 

## 2014-08-10 NOTE — Progress Notes (Addendum)
PT Cancellation Note  Patient Details Name: Clayton [name] MRN: [id number] DOB: 12-22-1955   Cancelled Treatment:    Reason Eval/Treat Not Completed: Medical issues which prohibited therapy.  Per orders, patient remains on bedrest.  MD:  Please write activity orders when appropriate for patient.  Will initiate PT evaluation at that time.  Thank you.   Vena AustriaDavis, Emanuela Runnion H 08/10/2014, 9:51 AM Durenda HurtSusan H. Renaldo Fiddleravis, PT, [facility]BA Acute Rehab Services Pager [phone]

## 2014-08-10 NOTE — H&P (Signed)
[facility] History and Physical  Clayton [name] ZOX:[id number] DOB: Oct 29, 1955 DOA: 08/09/2014  Referring physician: ED physician PCP: No primary care provider on file.   Chief Complaint: slurred speech, weakness  HPI:  Clayton [name] is a 59yo man with PMH of stroke, CAD, DM with neuropathy, s/p Left BKA, HTN, GERD, HLD, h/o MI who presents with slurred speech and weakness.  Clayton [name] has a long history of neurological symptoms.  He had a stroke in 2006 and then in 2007.  Since that time he has had residual weakness on the left and speech issues.  Yesterday he noticed that his speech became more garbled and he also began to feel more unsteady on his feet, having to hold on to chairs.  Today he noticed that his legs were weak to the point that he could not stand and his arms were weak as well.  He fell yesterday and has abrasions to his bilateral elbows.  He reports no vision changes, no word finding difficulty, no choking, coughing after eating.  His symptoms have come on very gradually and he cannot report a last time normal.  CT head in the ED showed old chronic changes.    Assessment and Plan: Neurological changes, history of stroke:  I am concerned this may be a new stroke vs. Progression of the previous one.  He had one episode of low blood pressure here in the ED and has renal insufficiency with BUN/Cr concerning for dehydration.  Symptoms started > 24 hours ago, ABCD2 score of 5 - MRI/MRA - TTE, CD - A1C, FLP - Neuro checks overnight - Telemetry - Hold BP medications and allow permissive HTN - Aspirin 325mg  daily (he is on asa 162 daily, reports compliance, may benefit from switch to plavix if he has indeed had a new stroke) - Continue statin - Swallow screen, NPO until passed - PT/OT/speech  AKI: Cr up to 1.5, baseline 0.9 - BUN/Cr ratio 27, concerning for pre renal - Check FENA - Hold renally cleared medications, hold diuretic - IVF, NS at 75cc/hr overnight, previous TTE from  2007 with normal EF, rechecking - Check BMET in the AM - Weakness as noted in HPI could be related to uremia  Elevated TnI, with h/o CAD - EKG with 1st degree AV block and old infarct - Trend CE, he has a h/o CAD - ASA, statin as noted above - Holding BP medications including lisinopril and metoprolol - He is currently asymptomatic, so minimal rise may be related to renal insufficiency    DM type 2 with diabetic peripheral neuropathy - Check A1C - Hold metformin and basal insulin - SSI    HLD (hyperlipidemia) - Lipid panel - Continue statin  Radiological Exams on Admission: Dg Chest 2 View  08/09/2014   CLINICAL DATA:  Unable to walk since yesterday. Weakness and unable to stand. Speech difficulty yesterday that better now. Face is non today. History of prior stroke, MI, CABG, coronary artery disease, diabetes.  EXAM: CHEST  2 VIEW  COMPARISON:  03/18/2008  FINDINGS: Postoperative changes in the mediastinum. Normal heart size and pulmonary vascularity. No focal airspace disease or consolidation in the lungs. No blunting of costophrenic angles. No pneumothorax. Mediastinal contours appear intact. Degenerative changes in the spine and shoulders.  IMPRESSION: No active cardiopulmonary disease.   Electronically Signed   By: Burman NievesWilliam  Stevens M.D.   On: 08/09/2014 22:45   Ct Head Wo Contrast  08/09/2014   CLINICAL DATA:  Weakness and numbness  EXAM: CT HEAD WITHOUT CONTRAST  TECHNIQUE: Contiguous axial images were obtained from the base of the skull through the vertex without intravenous contrast.  COMPARISON:  06/14/2007  FINDINGS: There is no intracranial hemorrhage, mass or evidence of acute infarction. There is old infarction with encephalomalacia in the left centrum semiovale. There is old infarction in the pons, right greater than left. These are unchanged from 2008. Additional smaller old infarctions are present in the periventricular frontal white matter, right greater than left. There is  mild generalized atrophy and mild chronic microvascular ischemic disease. There is no extra-axial fluid collection. Calvarium and skullbase are intact.  IMPRESSION: Mild atrophy and chronic microvascular ischemic disease. 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 No acute findings are evident.   Electronically Signed   By: Ellery Plunk M.D.   On: 08/09/2014 22:30   Code Status: Full Family Communication: Pt at bedside Disposition Plan: Admit for further evaluation    Review of Systems:  Constitutional: Negative for fever, chills and malaise/fatigue. Negative for diaphoresis.  HENT: Negative for hearing loss, ear pain, nosebleeds, congestion Eyes: Negative for blurred vision, double vision, photophobia Respiratory: Negative for cough, hemoptysis, sputum production, shortness of breath  Cardiovascular: + for chronic leg swelling, changes of venous stasis Negative for chest pain, palpitations, orthopnea Gastrointestinal: Negative for nausea, vomiting and abdominal pain. Negative for heartburn, constipation, blood in stool and melena.  Genitourinary: Negative for dysuria, urgency, frequency Musculoskeletal: + fall yesterday. Negative for myalgias, back pain, joint pain and falls.  Skin: Negative for itching and rash.  Neurological: + for subjective weakness in all four extremities.  + for worsening garbled speech. Negative for dizziness, tingling, tremors, sensory change, focal weakness, loss of consciousness and headaches.  Psychiatric/Behavioral:  The patient is not nervous/anxious.      Past Medical History  Diagnosis Date  . Stroke   . Coronary artery disease   . Diabetes mellitus without complication   . Hypertension   . MI (myocardial infarction)   . Hyperlipidemia   . GERD (gastroesophageal reflux disease)   . Diabetic neuropathy   . Diabetic retinopathy     Past Surgical History  Procedure Laterality Date  . Below knee leg amputation Left   . Appendectomy    .  Percutaneous placement intravascular stent cervical carotid artery Right     Social History:  reports that he has quit smoking. He does not have any smokeless tobacco history on file. He reports that he does not drink alcohol. His drug history is not on file.   Reports he quit smoking 27 years ago, quit drinking 10 years ago, and quit drugs 8 years ago.   Allergies  Allergen Reactions  . Niaspan [Niacin Er] Other (See Comments)    Face was red and hot    No family history [place]  Prior to Admission medications   Medication Sig Start Date End Date Taking? Authorizing Provider  aspirin 81 MG chewable tablet Chew 162 mg by mouth daily.   Yes Historical Provider, MD  atorvastatin (LIPITOR) 40 MG tablet Take 40 mg by mouth daily.   Yes Historical Provider, MD  cholecalciferol (VITAMIN D) 1000 UNITS tablet Take 2,000 Units by mouth 2 (two) times daily.   Yes Historical Provider, MD  furosemide (LASIX) 40 MG tablet Take 40 mg by mouth daily.   Yes Historical Provider, MD  gabapentin (NEURONTIN) 300 MG capsule Take 300 mg by mouth 4 (four) times daily.   Yes Historical Provider, MD  insulin aspart (  NOVOLOG) 100 UNIT/ML injection Inject 15-20 Units into the skin See admin instructions. Take 15 units at breakfast and supper. Take 20 units at lunch.   Yes Historical Provider, MD  insulin glargine (LANTUS) 100 UNIT/ML injection Inject 45 Units into the skin 2 (two) times daily.   Yes Historical Provider, MD  lisinopril (PRINIVIL,ZESTRIL) 2.5 MG tablet Take 2.5 mg by mouth daily.   Yes Historical Provider, MD  metFORMIN (GLUCOPHAGE) 1000 MG tablet Take 1,000 mg by mouth 2 (two) times daily with a meal.   Yes Historical Provider, MD  metoprolol (LOPRESSOR) 50 MG tablet Take 25 mg by mouth 2 (two) times daily.   Yes Historical Provider, MD  Multiple Vitamin (MULTIVITAMIN WITH MINERALS) TABS tablet Take 1 tablet by mouth daily.   Yes Historical Provider, MD  oxyCODONE (ROXICODONE) 15 MG immediate release  tablet Take 15 mg by mouth 3 (three) times daily.   Yes Historical Provider, MD    Physical Exam: Filed Vitals:   08/09/14 2200 08/09/14 2300 08/09/14 2330 08/10/14 0000  BP: 122/49 98/54 101/57 113/58  Pulse: 90 79 75 82  Temp:      TempSrc:      Resp: Height:      Weight:      SpO2: 99% 99% 95% 98%    Physical Exam  Constitutional: Appears older than stated age, no distress HENT: Normocephalic. Oropharynx is clear and moist.  Eyes: Conjunctivae injected, EOM are normal. PERRLA, no scleral icterus.  Neck: Normal ROM. Neck supple.  CVS: RR, NR, S1/S2 +, no murmurs, no gallops Pulmonary: Effort and breath sounds normal, rhonchi, wheezes, rales.  Abdominal: Soft. BS +,  no distension, tenderness Musculoskeletal: s/p BKA on the left, prosthetic in placed.  1+ edema to right leg with some chronic skin changes, no tenderness.  Neuro: Alert. Muscle tone normal.  Strength 4+/5 in bilateral upper and lower extremity, grip strength normal.  Tongue deviates to the right, unable to perform smile, he reports always having trouble on the right, but now unable on left, could puff out cheeks.  No sensory changes throughout.  Did not walk due to patient unsteady gait and preference.  Skin: Skin is warm and dry.  Psychiatric: Normal mood and affect. Behavior, judgment, thought content normal.   Labs on Admission:  Basic Metabolic Panel:  Recent Labs Lab 08/09/14 2036 08/09/14 2203  NA 136 137  K 3.9 4.1  CL 100 98  CO2 23  --   GLUCOSE 330* 362*  BUN 38* 41*  CREATININE 1.51* 1.50*  CALCIUM 9.0  --    Liver Function Tests:  Recent Labs Lab 08/09/14 2036  AST 44*  ALT 22  ALKPHOS 83  BILITOT 1.1  PROT 7.3  ALBUMIN 3.7   CBC:  Recent Labs Lab 08/09/14 2036 08/09/14 2203  WBC 9.8  --   NEUTROABS 7.2  --   HGB 12.5* 12.6*  HCT 36.6* 37.0*  MCV 87.4  --   PLT 193  --    Cardiac Enzymes:  Recent Labs Lab 08/09/14 2036  TROPONINI 0.04Debe Coder, MD [phone]  If 7PM-7AM, please contact night-coverage [email or web address] Password [email or web address] 08/10/2014, 12:22 AM

## 2014-08-10 NOTE — Progress Notes (Signed)
Patient arrived to 4N08 at 0130 alert and oriented via transport from ED.

## 2014-08-10 NOTE — Progress Notes (Signed)
Physical Therapy Evaluation Patient Details Name: Clayton [name] MRN: [id number] DOB: [birth year] Today's Date: 08/10/2014   History of Present Illness  Patient is a 59 yo male admitted 08/09/14 with slurred speech, weakness. MRI reveals advanced chronic ischemic changes affecting the pons and remainder the brainstem. 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 Chronic small vessel disease. No acute or subacute infarction.  Patient had 2 falls prior to admission while attempting transfers.  Patient with renal insufficiency.  PMH:  Mult CVA's, CAD, MI, DM, neuropathy, HTN, HLD, Lt BKA 2013, Rt transmetatarsal amputation 2008.  Clinical Impression  Patient presents with problems listed below.  Will benefit from acute PT to maximize functional independence prior to discharge.  Recommend SNF at discharge for continued therapy with longer recovery time.    Follow Up Recommendations SNF;Supervision/Assistance - 24 hour    Equipment Recommendations  None recommended by PT    Recommendations for Other Services       Precautions / Restrictions Precautions Precautions: Fall Precaution Comments: Falls pta. Required Braces or Orthoses: Other Brace/Splint Other Brace/Splint: LLE prosthesis Restrictions Weight Bearing Restrictions: No      Mobility  Bed Mobility Overal bed mobility: Needs Assistance Bed Mobility: Supine to Sit;Sit to Supine     Supine to sit: Mod assist Sit to supine: Mod assist   General bed mobility comments: Verbal cues for technique.  Assist to raise trunk to sitting position, and to scoot hips to edge of bed using bed pad.  Once upright, patient able to maintain static balance with min guard assist.  Patient sat EOB x 8 minutes.  Fatigued and returned to supine with mod assist.  Transfers                 General transfer comment: NT - encouraged nursing to use lift equipment at this point.  Ambulation/Gait                Stairs            Wheelchair Mobility    Modified Rankin (Stroke Patients Only) Modified Rankin (Stroke Patients Only) Pre-Morbid Rankin Score: Moderate disability Modified Rankin: Moderately severe disability     Balance Overall balance assessment: Needs assistance Sitting-balance support: Single extremity supported;Feet unsupported Sitting balance-Leahy Scale: Fair                                       Pertinent Vitals/Pain Pain Assessment: No/denies pain    Home Living Family/patient expects to be discharged to:: Private residence Living Arrangements: Alone Available Help at Discharge: Family;Available PRN/intermittently Type of Home: Apartment [employer]per patient[facility] Home Access: Level entry     Home Layout: One level Home Equipment: Environmental consultantWalker - 2 wheels;Shower seat;Bedside commode;Electric scooter;Wheelchair - manual      Prior Function Level of Independence: Needs assistance   Gait / Transfers Assistance Needed: Patient reports he transfers into/out of scooter/wheelchair on his own.  Not ambulatory.  Uses scooter/wheelchair for mobility.  ADL's / Homemaking Assistance Needed: Independent with bathing.  Facility provides meals in dining room, and housekeeping weekly.        Hand Dominance        Extremity/Trunk Assessment   Upper Extremity Assessment: Generalized weakness (Decreased shoulder ROM)           Lower Extremity Assessment: RLE deficits/detail;LLE deficits/detail RLE Deficits / Details: Strength grossly 4-/5; transmetatarsal amputation; neuropathy  with "numb" foot/decreased sensation LLE Deficits / Details: BKA; decreased strength grossly 3-/5 - 2/5.     Communication   Communication: Expressive difficulties  Cognition Arousal/Alertness: Awake/alert Behavior During Therapy: WFL for tasks assessed/performed Overall Cognitive Status: Within Functional Limits for tasks assessed                      General  Comments      Exercises        Assessment/Plan    PT Assessment Patient needs continued PT services  PT Diagnosis Generalized weakness (Difficulty with transfers)   PT Problem List Decreased strength;Decreased range of motion;Decreased activity tolerance;Decreased balance;Decreased mobility;Decreased coordination;Impaired sensation;Obesity  PT Treatment Interventions DME instruction;Functional mobility training;Therapeutic activities;Therapeutic exercise;Balance training;Patient/family education   PT Goals (Current goals can be found in the Care Plan section) Acute Rehab PT Goals Patient Stated Goal: To get strength back. PT Goal Formulation: With patient Time For Goal Achievement: 08/24/14 Potential to Achieve Goals: Good    Frequency Min 3X/week   Barriers to discharge Decreased caregiver support Per patient, he lives alone.    Co-evaluation               End of Session   Activity Tolerance: Patient limited by fatigue Patient left: in bed;with call bell/phone within reach;with bed alarm set Nurse Communication: Mobility status;Need for lift equipment    Functional Assessment Tool Used: Clinical judgement Functional Limitation: Mobility: Walking and moving around (Transfers) Mobility: Walking and Moving Around Current Status [phone]): At least 40 percent but less than 60 percent impaired, limited or restricted Mobility: Walking and Moving Around Goal Status [phone]): At least 1 percent but less than 20 percent impaired, limited or restricted    Time: 1741-1805 PT Time Calculation (min) (ACUTE ONLY): 24 min   Charges:   PT Evaluation $Initial PT Evaluation Tier I: 1 Procedure PT Treatments $Therapeutic Activity: 8-22 mins   PT G Codes:   PT G-Codes **NOT FOR INPATIENT CLASS** Functional Assessment Tool Used: Clinical judgement Functional Limitation: Mobility: Walking and moving around (Transfers) Mobility: Walking and Moving Around Current Status [phone]): At  least 40 percent but less than 60 percent impaired, limited or restricted Mobility: Walking and Moving Around Goal Status [phone]): At least 1 percent but less than 20 percent impaired, limited or restricted    Vena Austria 08/10/2014, 7:41 PM Durenda Hurt. Renaldo Fiddler, [facility] Pager [phone]

## 2014-08-10 NOTE — Consult Note (Signed)
Called to eval carotid stenosis in the setting of TIA vs unmasking of old CVA symptoms.  I have reviewed his MRA of the brain which does not image the carotid artery in the neck.  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  I have reviewed his angiogram from 2009 by Dr. Corliss Skainseveshwar which reveals widely patent bilateral extracranial carotid arteries.  Carotid doppler studies are pending.  Please contact me once these have be done.  The stenosis seen on MRA is not accessible surgically.  Durene CalWells Kay Ricciuti [phone]

## 2014-08-11 ENCOUNTER — Encounter (HOSPITAL_COMMUNITY): Payer: Self-pay | Admitting: Radiology

## 2014-08-11 ENCOUNTER — Observation Stay (HOSPITAL_COMMUNITY): Payer: Medicare HMO

## 2014-08-11 DIAGNOSIS — K219 Gastro-esophageal reflux disease without esophagitis: Secondary | ICD-10-CM

## 2014-08-11 DIAGNOSIS — G459 Transient cerebral ischemic attack, unspecified: Secondary | ICD-10-CM

## 2014-08-11 DIAGNOSIS — R4781 Slurred speech: Secondary | ICD-10-CM | POA: Diagnosis not present

## 2014-08-11 DIAGNOSIS — Z8673 Personal history of transient ischemic attack (TIA), and cerebral infarction without residual deficits: Secondary | ICD-10-CM | POA: Diagnosis not present

## 2014-08-11 DIAGNOSIS — N289 Disorder of kidney and ureter, unspecified: Secondary | ICD-10-CM | POA: Diagnosis not present

## 2014-08-11 LAB — GLUCOSE, CAPILLARY
GLUCOSE-CAPILLARY: 182 mg/dL — AB (ref 70–99)
GLUCOSE-CAPILLARY: 209 mg/dL — AB (ref 70–99)
Glucose-Capillary: 127 mg/dL — ABNORMAL HIGH (ref 70–99)
Glucose-Capillary: 142 mg/dL — ABNORMAL HIGH (ref 70–99)
Glucose-Capillary: 249 mg/dL — ABNORMAL HIGH (ref 70–99)
Glucose-Capillary: 287 mg/dL — ABNORMAL HIGH (ref 70–99)
Glucose-Capillary: 366 mg/dL — ABNORMAL HIGH (ref 70–99)

## 2014-08-11 LAB — BASIC METABOLIC PANEL
Anion gap: 5 (ref 5–15)
BUN: 22 mg/dL (ref 6–23)
CALCIUM: 9 mg/dL (ref 8.4–10.5)
CO2: 28 mmol/L (ref 19–32)
Chloride: 109 mmol/L (ref 96–112)
Creatinine, Ser: 1 mg/dL (ref 0.50–1.35)
GFR, EST NON AFRICAN AMERICAN: 80 mL/min — AB (ref >90)
GLUCOSE: 161 mg/dL — AB (ref 70–99)
Potassium: 4 mmol/L (ref 3.5–5.1)
SODIUM: 142 mmol/L (ref 135–145)

## 2014-08-11 LAB — HEMOGLOBIN A1C
HEMOGLOBIN A1C: 8.3 % — AB (ref 4.8–5.6)
Mean Plasma Glucose: 192 mg/dL

## 2014-08-11 MED ORDER — HEPARIN SOD (PORK) LOCK FLUSH 100 UNIT/ML IV SOLN
INTRAVENOUS | Status: AC | PRN
  Administered 2014-08-11: 1000 [IU] via INTRAVENOUS

## 2014-08-11 MED ORDER — FENTANYL CITRATE 0.05 MG/ML IJ SOLN
INTRAMUSCULAR | Status: AC | PRN
  Administered 2014-08-11: 25 ug via INTRAVENOUS

## 2014-08-11 MED ORDER — HYDROCODONE-ACETAMINOPHEN 5-325 MG PO TABS
1.0000 | ORAL_TABLET | Freq: Three times a day (TID) | ORAL | Status: DC | PRN
  Administered 2014-08-11 – 2014-08-15 (×6): 1 via ORAL
  Filled 2014-08-11 (×6): qty 1

## 2014-08-11 MED ORDER — MIDAZOLAM HCL 2 MG/2ML IJ SOLN
INTRAMUSCULAR | Status: AC | PRN
  Administered 2014-08-11: 1 mg via INTRAVENOUS

## 2014-08-11 MED ORDER — SODIUM CHLORIDE 0.9 % IV SOLN: INTRAVENOUS | Status: DC

## 2014-08-11 MED ORDER — FENTANYL CITRATE 0.05 MG/ML IJ SOLN
INTRAMUSCULAR | Status: AC
  Filled 2014-08-11: qty 2

## 2014-08-11 MED ORDER — IOHEXOL 300 MG/ML  SOLN
150.0000 mL | Freq: Once | INTRAMUSCULAR | Status: AC | PRN
  Administered 2014-08-11: 100 mL via INTRAVENOUS

## 2014-08-11 MED ORDER — LIDOCAINE HCL 1 % IJ SOLN
INTRAMUSCULAR | Status: AC
  Filled 2014-08-11: qty 20

## 2014-08-11 MED ORDER — MIDAZOLAM HCL 2 MG/2ML IJ SOLN
INTRAMUSCULAR | Status: AC
  Filled 2014-08-11: qty 2

## 2014-08-11 MED ORDER — HEPARIN SOD (PORK) LOCK FLUSH 100 UNIT/ML IV SOLN
INTRAVENOUS | Status: AC
  Filled 2014-08-11: qty 5

## 2014-08-11 NOTE — Progress Notes (Signed)
UR completed 

## 2014-08-11 NOTE — Progress Notes (Signed)
PT Cancellation Note  Patient Details Name: Clayton [name] MRN: [id number] DOB: [birth year]   Cancelled Treatment:    Reason Eval/Treat Not Completed: Patient at procedure or test/unavailable. Patient receiving a test at this time. Will follow as time allows   Fredrich BirksRobinette, Julia Elizabeth 08/11/2014, 12:01 PM

## 2014-08-11 NOTE — Progress Notes (Signed)
OT Cancellation Note  Patient Details Name: Ree KidaLarry K Tolin MRN: [id number] DOB: 1955/10/11   Cancelled Treatment:    Reason Eval/Treat Not Completed: Patient at procedure or test/ unavailable - will reattempt.   Angelene GiovanniConarpe, Shaana Acocella M  Ketih Goodie Welbyonarpe, OTR/L [phone]  08/11/2014, 2:06 PM

## 2014-08-11 NOTE — Procedures (Signed)
S/P 4 vessel cerebral arteriogram. Rt CFA approach. Findings. 1.Approx 75 to 80 %  intrastent stenosis with RT ICA ,and new approx 70 % stenosis Rt ICa suraclinoid ICa 2.Approx 70 to 75 % stenosis of Lt ICa petrous cav seg and 55 % stenosis of LT ICA  prox  .

## 2014-08-11 NOTE — Progress Notes (Signed)
Inpatient Diabetes Program Recommendations  AACE/ADA: New Consensus Statement on Inpatient Glycemic Control (2013)  Target Ranges:  Prepandial:   less than 140 mg/dL      Peak postprandial:   less than 180 mg/dL (1-2 hours)      Critically ill patients:  140 - 180 mg/dL   Reason for Assessment:  Results for Clayton [name], Clayton [name] (MRN [id number]) as of 08/11/2014 09:34  Ref. Range 08/10/2014 16:25 08/10/2014 20:15 08/11/2014 00:21 08/11/2014 04:18 08/11/2014 08:15  Glucose-Capillary Latest Range: 70-99 mg/dL 836249 (H) 629295 (H) 476287 (H) 182 (H) 249 (H)   Diabetes history: Type 2 Diabetes Outpatient Diabetes medications: Lantus 45 units bid, Metformin 1000 mg bid, Novolog 15 units breakfast, 20 units lunch, 15 units supper Current orders for Inpatient glycemic control:  Lantus 30 units daily, Novolog moderate q 4 hours  May consider increasing Lantus to 22 units bid (this is approximately 1/2 of home dose)  Thanks, Beryl MeagerJenny Asianna Brundage, RN, BC-ADM Inpatient Diabetes Coordinator Pager [phone]

## 2014-08-11 NOTE — Progress Notes (Signed)
Patient Demographics  Clayton [name], is a 59 y.o. male, DOB - Jul 27, 1955, WUJ:[id number]  Admit date - 08/09/2014   Admitting Physician Inez Catalina, MD  Outpatient Primary MD for the patient is No primary care provider on file.  LOS - 2   Chief Complaint  Patient presents with  . stroke symptoms         Subjective:   Clayton [name] today has, No headache, No chest pain, No abdominal pain - No Nausea, No new weakness tingling or numbness, No Cough - SOB. Resolved weakness and speech problems  Assessment & Plan    1. Slurred speech with some generalized nonfocal weakness. 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 MRA brain suggests severe bilateral carotid artery disease. To be seen by PT OT speech, A1c, echogram pending. LDL under 70, on aspirin statin continue.  Lab Results  Component Value Date   HGBA1C * 01/26/2008    6.3 (NOTE)   The ADA recommends the following therapeutic goal for glycemic   control related to Hgb A1C measurement:   Goal of Therapy:   < 7.0% Hgb A1C   Reference: American Diabetes Association: Clinical Practice   Recommendations 2008, Diabetes Care,  2008, 31:(Suppl 1).     2. Bilateral carotid artery disease on MRA. Continue aspirin and statin for secondary prevention, or vascular most lesions intracranial, have requested IR to evaluate. Carotid duplex pending.   3. Acute Renal failure. Last creatinine in the system is 0.9 but it is 61 years old. Resolved with hydration.   4. CAD with Borderline Trop - pain and symptom free, EKG non acute, on ASA- statin, check Echo , add low dose Coreg. Appointment trend is non-ACS pattern. Likely minimal elevation due to renal insufficiency. Will need outpatient cardiology  follow-up.   5.DM-2 - will add low-dose Lantus for better control, continue sliding scale.  Lab Results  Component Value Date   HGBA1C * 01/26/2008    6.3 (NOTE)   The ADA recommends the following therapeutic goal for glycemic   control related to Hgb A1C measurement:   Goal of Therapy:   < 7.0% Hgb A1C   Reference: American Diabetes Association: Clinical Practice   Recommendations 2008, Diabetes Care,  2008, 31:(Suppl 1).    CBG (last 3)   Recent Labs  08/11/14 0021 08/11/14 0418 08/11/14 0815  GLUCAP 287* 182* 249*    6. Diabetic neuropathy. Continue Neurontin.   7. Dyslipidemia. On statin. LDL less than 70.    Code Status: Full  Family Communication: None  Disposition Plan: Home   Procedures   CT head, MRI/MRA brain, carotid duplex, echogram   Consults     Vascular, IR - Neurology  Medications  Scheduled Meds: . aspirin  325 mg Oral Daily  . atorvastatin  40 mg Oral Daily  . carvedilol  3.125 mg Oral BID WC  . cholecalciferol  2,000 Units Oral BID  . gabapentin  300 mg Oral QID  . heparin  5,000 Units Subcutaneous 3 times per day  . insulin aspart  0-15 Units Subcutaneous 6 times per day  . insulin glargine  30 Units Subcutaneous Daily   Continuous Infusions: . sodium chloride 75 mL/hr  at 08/11/14 0635   PRN Meds:.acetaminophen, RESOURCE THICKENUP CLEAR  DVT Prophylaxis  Heparin   Lab Results  Component Value Date   PLT 153 08/10/2014    Antibiotics    Anti-infectives    None          Objective:   Filed Vitals:   08/10/14 2111 08/11/14 0228 08/11/14 0733 08/11/14 0830  BP: 153/78 139/61 156/91 104/34  Pulse: 91 74 77 83  Temp: 98.3 F (36.8 C) 98 F (36.7 C) 97.7 F (36.5 C)   TempSrc: Oral Oral Oral   Resp: 18 18 18    Height:      Weight:      SpO2: 100% 100% 99%     Wt Readings from Last 3 Encounters:  08/10/14 103 kg (227 lb 1.2 oz)     Intake/Output Summary (Last 24 hours) at 08/11/14 0955 Last data filed at  08/10/14 2100  Gross per 24 hour  Intake    600 ml  Output    900 ml  Net   -300 ml     Physical Exam  Awake Alert, Oriented X 3, No new F.N deficits, Normal affect [place].AT,PERRAL Supple Neck,No JVD, No cervical lymphadenopathy appriciated.  Symmetrical Chest wall movement, Good air movement bilaterally, CTAB RRR,No Gallops,Rubs or new Murmurs, No Parasternal Heave +ve B.Sounds, Abd Soft, No tenderness, No organomegaly appriciated, No rebound - guarding or rigidity. No Cyanosis, Clubbing or edema, No new Rash or bruise , L BKA, R transmetatarsal amputation.   Data Review   Micro Results No results found for this or any previous visit (from the past 240 hour(s)).  Radiology Reports Dg Chest 2 View  08/09/2014   CLINICAL DATA:  Unable to walk since yesterday. Weakness and unable to stand. Speech difficulty yesterday that better now. Face is non today. History of prior stroke, MI, CABG, coronary artery disease, diabetes.  EXAM: CHEST  2 VIEW  COMPARISON:  03/18/2008  FINDINGS: Postoperative changes in the mediastinum. Normal heart size and pulmonary vascularity. No focal airspace disease or consolidation in the lungs. No blunting of costophrenic angles. No pneumothorax. Mediastinal contours appear intact. Degenerative changes in the spine and shoulders.  IMPRESSION: No active cardiopulmonary disease.   Electronically Signed   By: Burman NievesWilliam  Stevens M.D.   On: 08/09/2014 22:45   Ct Head Wo Contrast  08/09/2014   CLINICAL DATA:  Weakness and numbness  EXAM: CT HEAD WITHOUT CONTRAST  TECHNIQUE: Contiguous axial images were obtained from the base of the skull through the vertex without intravenous contrast.  COMPARISON:  06/14/2007  FINDINGS: There is no intracranial hemorrhage, mass or evidence of acute infarction. There is old infarction with encephalomalacia in the left centrum semiovale. There is old infarction in the pons, right greater than left. These are unchanged from 2008. Additional  smaller old infarctions are present in the periventricular frontal white matter, right greater than left. There is mild generalized atrophy and mild chronic microvascular ischemic disease. There is no extra-axial fluid collection. Calvarium and skullbase are intact.  IMPRESSION: Mild atrophy and chronic microvascular ischemic disease. 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 No acute findings are evident.   Electronically Signed   By: Ellery Plunkaniel R Mitchell M.D.   On: 08/09/2014 22:30   Mri Brain Without Contrast  08/10/2014   CLINICAL DATA:  Personal previous history of stroke and vascular disease. Acute presentation with slurred speech and weakness. Symptoms began last night.  EXAM: MRI HEAD WITHOUT CONTRAST  TECHNIQUE: Multiplanar, multiecho pulse sequences  of the brain and surrounding structures were obtained without intravenous contrast.  COMPARISON:  Head CT 08/09/2014 in 06/14/2007.  MRI 01/17/2006.  FINDINGS: Diffusion imaging does not show any acute or subacute infarction. There is extensive old infarction throughout the pons bilaterally. There are old small vessel cerebellar infarctions in their cerebellar atrophy. There are old infarctions in the left basal ganglia and radiating white matter tracts and moderate chronic small-vessel ischemic changes throughout the cerebral hemispheric white matter. No cortical infarction is seen within the cerebral hemispheres. No neoplastic mass lesion, hemorrhage, hydrocephalus or extra-axial collection. No pituitary mass. No inflammatory sinus disease. No skull or skullbase lesion. Major vessels at the base of the brain show flow.  IMPRESSION: Advanced chronic ischemic changes affecting the pons and remainder the brainstem. Old deep brain infarctions including the left basal ganglia up. Chronic small vessel disease. No acute or subacute infarction or other reversible process.   Electronically Signed   By: Paulina Fusi M.D.   On: 08/10/2014 07:34   Mr Maxine Glenn  Head/brain Wo Cm  08/10/2014   CLINICAL DATA:  Widespread cardiovascular disease. Slurred speech and weakness. Chronic ischemic changes of the brain particularly in the pons.  EXAM: MRA HEAD WITHOUT CONTRAST  TECHNIQUE: Angiographic images of the Circle of Willis were obtained using MRA technique without intravenous contrast.  COMPARISON:  MRI brain same day.  Angiography 06/14/2007.  FINDINGS: Severe stenosis of the right internal carotid artery at the skullbase is again demonstrated. At the proximal carotid canal, there is a 50% diameter stenosis. At the proximal siphon, the lumen is reduced to 1 mm or less, but antegrade flow does persist. Beyond that, there is pronounced atherosclerotic irregularity in the siphon region. At the distal siphon/ supra clinoid internal carotid artery, there is severe stenosis and irregularity with luminal diameter of 1 mm. No flow is seen in the right anterior cerebral artery. Right middle cerebral artery is patent without proximal stenosis.  The left internal carotid artery is widely patent through the carotid canal. There is advanced atherosclerotic disease in the siphon region with narrowing and irregularity particularly in the proximal siphon. Stenosis in that region is estimated at 50-70%. Pronounced irregularity is seen beyond that. Narrowing of the distal siphon is approximately 30%. The anterior and middle cerebral vessels are patent, with flow from this site to supplying both anterior cerebral arteries. There is no proximal left middle cerebral artery stenosis.  Both vertebral arteries are widely patent through the foramen magnum to the basilar. The right is larger than the left. No basilar stenosis. Flow is demonstrated in a left posterior inferior cerebellar artery, bilateral anterior inferior cerebellar arteries, bilateral superior cerebellar arteries, and both posterior cerebral arteries. Medium to small branch vessels show atherosclerotic narrowing and irregularity.   IMPRESSION: Severe extracranial stenotic disease of the right internal carotid artery. 50% stenosis of the proximal right carotid canal. Very severe stenosis at the proximal right siphon with luminal diameter 1 mm or less. Severe stenosis and irregularity throughout the right carotid siphon beyond that.  Moderate to severe atherosclerotic disease in the left carotid siphon. Pronounced irregularity of the vessel, but with maximal stenosis estimated at 50-70%.  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  No correctable proximal MCA stenosis.  Distal vessel atherosclerotic narrowing and irregularity, particularly evident in the posterior circulation branches.   Electronically Signed   By: Paulina Fusi M.D.   On: 08/10/2014 09:31  CBC  Recent Labs Lab 08/09/14 2036 08/09/14 2203 08/10/14 0400  WBC 9.8  --  7.3  HGB 12.5* 12.6* 11.2*  HCT 36.6* 37.0* 33.5*  PLT 193  --  153  MCV 87.4  --  89.1  MCH 29.8  --  29.8  MCHC 34.2  --  33.4  RDW 16.9*  --  16.9*  LYMPHSABS 1.8  --   --   MONOABS 0.6  --   --   EOSABS 0.2  --   --   BASOSABS 0.0  --   --     Chemistries   Recent Labs Lab 08/09/14 2036 08/09/14 2203 08/10/14 0400 08/11/14 0710  NA 136 137  --  142  K 3.9 4.1  --  4.0  CL 100 98  --  109  CO2 23  --   --  28  GLUCOSE 330* 362*  --  161*  BUN 38* 41*  --  22  CREATININE 1.51* 1.50* 1.50* 1.00  CALCIUM 9.0  --   --  9.0  AST 44*  --   --   --   ALT 22  --   --   --   ALKPHOS 83  --   --   --   BILITOT 1.1  --   --   --    ------------------------------------------------------------------------------------------------------------------ estimated creatinine clearance is 91.3 mL/min (by C-G formula based on Cr of 1). ------------------------------------------------------------------------------------------------------------------ No results for input(s): HGBA1C in the last 72  hours. ------------------------------------------------------------------------------------------------------------------  Recent Labs  08/10/14 0400  CHOL 128  HDL 21*  LDLCALC 59  TRIG 045*  CHOLHDL 6.1   ------------------------------------------------------------------------------------------------------------------ No results for input(s): TSH, T4TOTAL, T3FREE, THYROIDAB in the last 72 hours.  Invalid input(s): FREET3 ------------------------------------------------------------------------------------------------------------------ No results for input(s): VITAMINB12, FOLATE, FERRITIN, TIBC, IRON, RETICCTPCT in the last 72 hours.  Coagulation profile  Recent Labs Lab 08/09/14 2036  INR 1.03    No results for input(s): DDIMER in the last 72 hours.  Cardiac Enzymes  Recent Labs Lab 08/10/14 0400 08/10/14 0737 08/10/14 1355  TROPONINI 0.04* 0.04* 0.03   ------------------------------------------------------------------------------------------------------------------ Invalid input(s): POCBNP     Time Spent in minutes  35   SINGH,PRASHANT K M.D on 08/11/2014 at 9:55 AM  Between 7am to 7pm - Pager - [phone]  After 7pm go to [email or web address] - password [email or web address]  [facility] Office  [phone]

## 2014-08-11 NOTE — Progress Notes (Signed)
Echocardiogram 2D Echocardiogram has been performed.  Clayton [name], [name] M 08/11/2014, 11:37 AM

## 2014-08-11 NOTE — H&P (Signed)
Reason for Consult: TIA Chief Complaint: Chief Complaint  Patient presents with  . stroke symptoms     Referring Physician(s): TRH  History of Present Illness: Clayton [name] is a 59 y.o. male with PMHx of prior CVA's s/p RICA stenting 2008, last seen by Dr. Corliss Skains in 2010. The patient presented to ED on 1/30 with c/o slurred speech and generalized weakness, MRI/MRA 08/10/14 revealed abnormal findings with bilateral stenosis. Patient with significant history of DM, CAD, PAD s/p LBKA amputation. He was also found to have ARF resolved with hydration, CR 1.0 today (1.5) IR received request for consult and he is scheduled today for cerebral arteriogram. He denies any current extremity weakness. He denies any chest pain or shortness of breath. He states prior to admission he was taking aspirin 81mg  BID and plavix 75mg  daily. He denies any allergies to iodinated contrast.   Past Medical History  Diagnosis Date  . Stroke   . Coronary artery disease   . Diabetes mellitus without complication   . Hypertension   . MI (myocardial infarction)   . Hyperlipidemia   . GERD (gastroesophageal reflux disease)   . Diabetic neuropathy   . Diabetic retinopathy     Past Surgical History  Procedure Laterality Date  . Below knee leg amputation Left   . Appendectomy    . Percutaneous placement intravascular stent cervical carotid artery Right     Allergies: Niaspan  Medications: Prior to Admission medications   Medication Sig Start Date End Date Taking? Authorizing Provider  aspirin 81 MG chewable tablet Chew 162 mg by mouth daily.   Yes Historical Provider, MD  atorvastatin (LIPITOR) 40 MG tablet Take 40 mg by mouth daily.   Yes Historical Provider, MD  cholecalciferol (VITAMIN D) 1000 UNITS tablet Take 2,000 Units by mouth 2 (two) times daily.   Yes Historical Provider, MD  furosemide (LASIX) 40 MG tablet Take 40 mg by mouth daily.   Yes Historical Provider, MD  gabapentin (NEURONTIN) 300 MG  capsule Take 300 mg by mouth 4 (four) times daily.   Yes Historical Provider, MD  insulin aspart (NOVOLOG) 100 UNIT/ML injection Inject 15-20 Units into the skin See admin instructions. Take 15 units at breakfast and supper. Take 20 units at lunch.   Yes Historical Provider, MD  insulin glargine (LANTUS) 100 UNIT/ML injection Inject 45 Units into the skin 2 (two) times daily.   Yes Historical Provider, MD  lisinopril (PRINIVIL,ZESTRIL) 2.5 MG tablet Take 2.5 mg by mouth daily.   Yes Historical Provider, MD  metFORMIN (GLUCOPHAGE) 1000 MG tablet Take 1,000 mg by mouth 2 (two) times daily with a meal.   Yes Historical Provider, MD  metoprolol (LOPRESSOR) 50 MG tablet Take 25 mg by mouth 2 (two) times daily.   Yes Historical Provider, MD  Multiple Vitamin (MULTIVITAMIN WITH MINERALS) TABS tablet Take 1 tablet by mouth daily.   Yes Historical Provider, MD  oxyCODONE (ROXICODONE) 15 MG immediate release tablet Take 15 mg by mouth 3 (three) times daily.   Yes Historical Provider, MD    History reviewed. No pertinent family history.  History   Social History  . Marital Status: Divorced    Spouse Name: N/A    Number of Children: N/A  . Years of Education: N/A   Social History Main Topics  . Smoking status: Former Games developer  . Smokeless tobacco: None  . Alcohol Use: No  . Drug Use: None  . Sexual Activity: None   Other Topics Concern  .  None   Social History Narrative   Review of Systems: A 12 point ROS discussed and pertinent positives are indicated in the HPI above.  All other systems are negative.  Review of Systems  Vital Signs: BP 174/71 mmHg  Pulse 80  Temp(Src) 98.6 F (37 C) (Oral)  Resp 18  Ht  (1.727 m)  Wt 227 lb 1.2 oz (103 kg)  BMI 34.53 kg/m2  SpO2 99%  Physical Exam General: A&Ox3, NAD, emotional and crying at times Heart: RRR without M/G/R Lungs: CTA B/L Abd: Soft, NT, ND, (+) BS Ext: equal strength upper and lower ext, LBKA amputation  Imaging: Dg  Chest 2 View  08/09/2014   CLINICAL DATA:  Unable to walk since yesterday. Weakness and unable to stand. Speech difficulty yesterday that better now. Face is non today. History of prior stroke, MI, CABG, coronary artery disease, diabetes.  EXAM: CHEST  2 VIEW  COMPARISON:  03/18/2008  FINDINGS: Postoperative changes in the mediastinum. Normal heart size and pulmonary vascularity. No focal airspace disease or consolidation in the lungs. No blunting of costophrenic angles. No pneumothorax. Mediastinal contours appear intact. Degenerative changes in the spine and shoulders.  IMPRESSION: No active cardiopulmonary disease.   Electronically Signed   By: Burman Nieves M.D.   On: 08/09/2014 22:45   Ct Head Wo Contrast  08/09/2014   CLINICAL DATA:  Weakness and numbness  EXAM: CT HEAD WITHOUT CONTRAST  TECHNIQUE: Contiguous axial images were obtained from the base of the skull through the vertex without intravenous contrast.  COMPARISON:  06/14/2007  FINDINGS: There is no intracranial hemorrhage, mass or evidence of acute infarction. There is old infarction with encephalomalacia in the left centrum semiovale. There is old infarction in the pons, right greater than left. These are unchanged from 2008. Additional smaller old infarctions are present in the periventricular frontal white matter, right greater than left. There is mild generalized atrophy and mild chronic microvascular ischemic disease. There is no extra-axial fluid collection. Calvarium and skullbase are intact.  IMPRESSION: Mild atrophy and chronic microvascular ischemic disease. 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 No acute findings are evident.   Electronically Signed   By: Ellery Plunk M.D.   On: 08/09/2014 22:30   Mri Brain Without Contrast  08/10/2014   CLINICAL DATA:  Personal previous history of stroke and vascular disease. Acute presentation with slurred speech and weakness. Symptoms began last night.  EXAM: MRI HEAD WITHOUT  CONTRAST  TECHNIQUE: Multiplanar, multiecho pulse sequences of the brain and surrounding structures were obtained without intravenous contrast.  COMPARISON:  Head CT 08/09/2014 in 06/14/2007.  MRI 01/17/2006.  FINDINGS: Diffusion imaging does not show any acute or subacute infarction. There is extensive old infarction throughout the pons bilaterally. There are old small vessel cerebellar infarctions in their cerebellar atrophy. There are old infarctions in the left basal ganglia and radiating white matter tracts and moderate chronic small-vessel ischemic changes throughout the cerebral hemispheric white matter. No cortical infarction is seen within the cerebral hemispheres. No neoplastic mass lesion, hemorrhage, hydrocephalus or extra-axial collection. No pituitary mass. No inflammatory sinus disease. No skull or skullbase lesion. Major vessels at the base of the brain show flow.  IMPRESSION: Advanced chronic ischemic changes affecting the pons and remainder the brainstem. Old deep brain infarctions including the left basal ganglia up. Chronic small vessel disease. No acute or subacute infarction or other reversible process.   Electronically Signed   By: Paulina Fusi M.D.   On: 08/10/2014  07:34   Mr Maxine Glenn Head/brain Wo Cm  08/10/2014   CLINICAL DATA:  Widespread cardiovascular disease. Slurred speech and weakness. Chronic ischemic changes of the brain particularly in the pons.  EXAM: MRA HEAD WITHOUT CONTRAST  TECHNIQUE: Angiographic images of the Circle of Willis were obtained using MRA technique without intravenous contrast.  COMPARISON:  MRI brain same day.  Angiography 06/14/2007.  FINDINGS: Severe stenosis of the right internal carotid artery at the skullbase is again demonstrated. At the proximal carotid canal, there is a 50% diameter stenosis. At the proximal siphon, the lumen is reduced to 1 mm or less, but antegrade flow does persist. Beyond that, there is pronounced atherosclerotic irregularity in the  siphon region. At the distal siphon/ supra clinoid internal carotid artery, there is severe stenosis and irregularity with luminal diameter of 1 mm. No flow is seen in the right anterior cerebral artery. Right middle cerebral artery is patent without proximal stenosis.  The left internal carotid artery is widely patent through the carotid canal. There is advanced atherosclerotic disease in the siphon region with narrowing and irregularity particularly in the proximal siphon. Stenosis in that region is estimated at 50-70%. Pronounced irregularity is seen beyond that. Narrowing of the distal siphon is approximately 30%. The anterior and middle cerebral vessels are patent, with flow from this site to supplying both anterior cerebral arteries. There is no proximal left middle cerebral artery stenosis.  Both vertebral arteries are widely patent through the foramen magnum to the basilar. The right is larger than the left. No basilar stenosis. Flow is demonstrated in a left posterior inferior cerebellar artery, bilateral anterior inferior cerebellar arteries, bilateral superior cerebellar arteries, and both posterior cerebral arteries. Medium to small branch vessels show atherosclerotic narrowing and irregularity.  IMPRESSION: Severe extracranial stenotic disease of the right internal carotid artery. 50% stenosis of the proximal right carotid canal. Very severe stenosis at the proximal right siphon with luminal diameter 1 mm or less. Severe stenosis and irregularity throughout the right carotid siphon beyond that.  Moderate to severe atherosclerotic disease in the left carotid siphon. Pronounced irregularity of the vessel, but with maximal stenosis estimated at 50-70%.  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  No correctable proximal MCA stenosis.  Distal vessel atherosclerotic narrowing and irregularity, particularly evident in the  posterior circulation branches.   Electronically Signed   By: Paulina Fusi M.D.   On: 08/10/2014 09:31    Labs:  CBC:  Recent Labs  08/09/14 2036 08/09/14 2203 08/10/14 0400  WBC 9.8  --  7.3  HGB 12.5* 12.6* 11.2*  HCT 36.6* 37.0* 33.5*  PLT 193  --  153    COAGS:  Recent Labs  08/09/14 2036 08/09/14 2148  INR 1.03  --   APTT  --  34    BMP:  Recent Labs  08/09/14 2036 08/09/14 2203 08/10/14 0400 08/11/14 0710  NA 136 137  --  142  K 3.9 4.1  --  4.0  CL 100 98  --  109  CO2 23  --   --  28  GLUCOSE 330* 362*  --  161*  BUN 38* 41*  --  22  CALCIUM 9.0  --   --  9.0  CREATININE 1.51* 1.50* 1.50* 1.00  GFRNONAA 49*  --  49* 80*  GFRAA 56*  --  57* >90    LIVER FUNCTION TESTS:  Recent Labs  08/09/14  2036  BILITOT 1.1  AST 44*  ALT 22  ALKPHOS 83  PROT 7.3  ALBUMIN 3.7    Assessment and Plan: History of prior CVA's s/p RICA stenting  Last seen by Dr. Corliss Skainseveshwar 2010 TIA with Slurred speech and generalized weakness MRI/MRA 08/10/14 abnormal findings with bilateral stenosis DM PAD s/p LBKA amputation ARF resolved with hydration, CR 1.0  Request for consult  Scheduled today for cerebral arteriogram Patient has been NPO since 0800, on aspirin 325 mg, labs reviewed Risks and Benefits discussed with the patient. All of the patient's questions were answered, patient is agreeable to proceed. Consent signed and in chart.   Thank you for this interesting consult.  I greatly enjoyed meeting Clayton [name] and look forward to participating in their care.  SignedBerneta Levins: Oma Alpert D 08/11/2014, 1:49 PM   I spent a total of 40 minutes face to face in clinical consultation, greater than 50% of which was counseling/coordinating care

## 2014-08-11 NOTE — Progress Notes (Signed)
VASCULAR LAB PRELIMINARY  PRELIMINARY  PRELIMINARY  PRELIMINARY  Carotid Dopplers completed.    Preliminary report:  There is 40-59% right ICA stenosis, highest end of scale.  There is 80-99% left ICA stenosis, highest end of scale.  Vertebral artery flow is antegrade.   Jenene Kauffmann, RVT 08/11/2014, 11:29 AM

## 2014-08-11 NOTE — Progress Notes (Addendum)
Speech Language Pathology Treatment: Dysphagia  Patient Details Name: Ree KidaLarry K Callaway MRN: [id number] DOB: 09/16/1955 Today's Date: 08/11/2014 Time: [id number] SLP Time Calculation (min) (ACUTE ONLY): 11 min  Assessment / Plan / Recommendation Clinical Impression  Pt continues to show overt signs of aspiration with thin liquids, including strong cough response and pt reports of thin liquids "getting stuck in my throat". Despite the above, he continues to deny any difficulty with swallowing. Recommend to proceed with MBS to more objectively assess oropharyngeal swallowing function. Per radiology, this can be completed as early as tomorrow. Will continue Dys 3 and nectar thick liquids until that time.   HPI HPI: 59 yo man with PMH of stroke, CAD, DM with neuropathy, s/p Left BKA, HTN, GERD, HLD, h/o MI who presents with slurred speech and weakness. Mr. Yetta BarreJones has a long history of neurological symptoms. He had a stroke in 2006 and then in 2007. Since that time he has had residual weakness on the left and speech issues. Jan29 noticed that his speech became more garbled and he also began to feel more unsteady, with worsening weakness next 24 hours. MRI reveals advanced chronic ischemic changes affecting the pons and remainder the brainstem. 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 Chronic small vessel disease. No acute or subacute infarction.     Pertinent Vitals Pain Assessment: No/denies pain  SLP Plan  MBS    Recommendations Diet recommendations: Dysphagia 3 (mechanical soft);Nectar-thick liquid Liquids provided via: Cup Medication Administration: Whole meds with liquid Supervision: Intermittent supervision to cue for compensatory strategies Compensations: Slow rate;Small sips/bites Postural Changes and/or Swallow Maneuvers: Seated upright 90 degrees       Oral Care Recommendations: Oral care BID Follow up Recommendations:  (tba) Plan: MBS    GO     Maxcine HamLaura Paiewonsky,  M.A. CCC-SLP [phone]  Maxcine Hamaiewonsky, Ashantia Amaral 08/11/2014, 1:06 PM

## 2014-08-12 ENCOUNTER — Telehealth: Payer: Self-pay | Admitting: Surgery

## 2014-08-12 ENCOUNTER — Observation Stay (HOSPITAL_COMMUNITY): Payer: Medicare HMO

## 2014-08-12 ENCOUNTER — Other Ambulatory Visit: Payer: Self-pay | Admitting: *Deleted

## 2014-08-12 DIAGNOSIS — R4781 Slurred speech: Secondary | ICD-10-CM | POA: Diagnosis not present

## 2014-08-12 DIAGNOSIS — I6523 Occlusion and stenosis of bilateral carotid arteries: Secondary | ICD-10-CM

## 2014-08-12 DIAGNOSIS — I6521 Occlusion and stenosis of right carotid artery: Secondary | ICD-10-CM | POA: Diagnosis not present

## 2014-08-12 LAB — GLUCOSE, CAPILLARY
GLUCOSE-CAPILLARY: 130 mg/dL — AB (ref 70–99)
GLUCOSE-CAPILLARY: 202 mg/dL — AB (ref 70–99)
Glucose-Capillary: 113 mg/dL — ABNORMAL HIGH (ref 70–99)
Glucose-Capillary: 180 mg/dL — ABNORMAL HIGH (ref 70–99)
Glucose-Capillary: 286 mg/dL — ABNORMAL HIGH (ref 70–99)

## 2014-08-12 LAB — HEMOGLOBIN A1C
Hgb A1c MFr Bld: 8.3 % — ABNORMAL HIGH (ref 4.8–5.6)
Mean Plasma Glucose: 192 mg/dL

## 2014-08-12 MED ORDER — GABAPENTIN 300 MG PO CAPS
600.0000 mg | ORAL_CAPSULE | Freq: Four times a day (QID) | ORAL | Status: DC
  Administered 2014-08-12 – 2014-08-15 (×13): 600 mg via ORAL
  Filled 2014-08-12 (×5): qty 2
  Filled 2014-08-12: qty 6
  Filled 2014-08-12 (×7): qty 2

## 2014-08-12 MED ORDER — CLOPIDOGREL BISULFATE 75 MG PO TABS
75.0000 mg | ORAL_TABLET | Freq: Every day | ORAL | Status: DC
  Administered 2014-08-12 – 2014-08-15 (×4): 75 mg via ORAL
  Filled 2014-08-12 (×4): qty 1

## 2014-08-12 NOTE — Clinical Social Work Psychosocial (Signed)
Clinical Social Work Department BRIEF PSYCHOSOCIAL ASSESSMENT 08/12/2014  Patient:  Clayton [name], FIORITO     Account Number:  [id number]     Admit date:  08/09/2014  Clinical Social Worker:  Glendon Axe, CLINICAL SOCIAL WORKER  Date/Time:  08/12/2014 01:20 PM  Referred by:  Physician  Date Referred:  08/12/2014 Referred for  SNF Placement   Other Referral:   Interview type:  Patient Other interview type:   CSW met with patient at bedside.    PSYCHOSOCIAL DATA Living Status:  ALONE Admitted from facility:   Level of care:   Primary support name:  Azahel Belcastro [phone] Primary support relationship to patient:  SIBLING Degree of support available:   Strong    CURRENT CONCERNS Current Concerns  Post-Acute Placement   Other Concerns:    SOCIAL WORK ASSESSMENT / PLAN Clinical Social Worker met with patient at bedside in reference to post-acute placement for SNF. CSW explained CSW role and SNF process. CSW also reviewed and provided SNF list. Pt reported a few years ago he a resident at [facility] for therapy and would like to return. Pt further reported his sister, Jenny Reichmann would be able to bring him clothing to the facility once he arrives. Pt also gave CSW permission to contact his sister if needed. CSW will continue to follow pt for continued support and to facilitate pt's discharge once medically stable.   Assessment/plan status:  Psychosocial Support/Ongoing Assessment of Needs Other assessment/ plan:   FL-2 on chart for MD signature.  D/C plan is to transition to [facility] once medically stable.   Information/referral to community resources:   SNF information/list provided.    PATIENT'S/FAMILY'S RESPONSE TO PLAN OF CARE: Pt lying in bed watching tv. Pt alert and oriented X4. Pt reported he is agreeable to SNF placement and would like to be placed at [facility]. Pt also asked questions in reference to transportation. Pt pleasant and appreciated social work intervention.        Glendon Axe, MSW, LCSWA [phone] 08/12/2014 1:31 PM

## 2014-08-12 NOTE — Telephone Encounter (Signed)
-----   Message from Sharee PimpleMarilyn K McChesney, RN sent at 08/12/2014  4:16 PM EST ----- Regarding: Schedule   ----- Message -----    From: Nada LibmanVance W Brabham, MD    Sent: 08/12/2014   3:54 PM      To: [facility] Charge Pool  08/12/2014: Level IV consult.    Schedule patient to follow-up with Rosalita ChessmanSuzanne in 6 months with a carotid ultrasound

## 2014-08-12 NOTE — Progress Notes (Signed)
Physical Therapy Note    08/12/14 1415  PT Visit Information  Last PT Received On 08/12/14  Assistance Needed +1  History of Present Illness Patient is a 59 yo male admitted 08/09/14 with slurred speech, weakness. MRI reveals advanced chronic ischemic changes affecting the pons and remainder the brainstem. 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 Chronic small vessel disease. No acute or subacute infarction.  Patient had 2 falls prior to admission while attempting transfers.  Patient with renal insufficiency.  PMH:  Mult CVA's, CAD, MI, DM, neuropathy, HTN, HLD, Lt BKA 2013, Rt transmetatarsal amputation 2008.  PT Time Calculation  PT Start Time (ACUTE ONLY) 1349  PT Stop Time (ACUTE ONLY) 1414  PT Time Calculation (min) (ACUTE ONLY) 25 min  Subjective Data  Patient Stated Goal regain strength  Precautions  Precautions Fall  Precaution Comments Falls pta.  Required Braces or Orthoses Other Brace/Splint  Other Brace/Splint LLE prosthesis  Restrictions  Weight Bearing Restrictions No  Pain Assessment  Pain Assessment No/denies pain  Cognition  Arousal/Alertness Awake/alert  Behavior During Therapy WFL for tasks assessed/performed  Overall Cognitive Status Within Functional Limits for tasks assessed  Bed Mobility  Overal bed mobility Needs Assistance  Bed Mobility Supine to Sit  Supine to sit Min guard  General bed mobility comments Pt reports he has an electric hospital bed at home; [facility] to simulate his own bed  Transfers  Overall transfer level Needs assistance  Equipment used 1 person hand held assist (bed rail or armrest)  Transfers Stand Pivot Transfers  Stand pivot transfers Min assist  General transfer comment Pt able to sit EOB and don his Lt BKA prosthesis and Rt shoe himself (close guarding due to height of bed and slick linens); placed recliner in position to simulate how pt transfers to his electric w/c at home. Pt required 2  attempts and then was able to come to partial standing--holding onto bed rail and armrest. Able to take pivotal steps with need for PT's arm to simulate the height of his armrest on his power chair. Repeated transfer total of 3x including to his Lt.  Ambulation/Gait  General Gait Details pt reports at baseline he does not walk; uses power chair  Modified Rankin (Stroke Patients Only)  Pre-Morbid Rankin Score 3  Modified Rankin 4  Balance  Sitting-balance support Feet unsupported  Sitting balance-Leahy Scale Good  PT - End of Session  Equipment Utilized During Treatment Gait belt  Activity Tolerance Patient tolerated treatment well  Patient left with call bell/phone within reach;in chair  Nurse Communication Mobility status (how to doff pt's prosthesis on return to bed)  PT - Assessment/Plan  PT Plan Current plan remains appropriate  PT Frequency (ACUTE ONLY) Min 3X/week  Follow Up Recommendations SNF;Supervision/Assistance - 24 hour  PT equipment None recommended by PT  PT Goal Progression  Progress towards PT goals Progressing toward goals (good progress; look for consistency)  PT G-Codes **NOT FOR INPATIENT CLASS**  Functional Limitation (Transfers)  PT General Charges  $$ ACUTE PT VISIT 1 Procedure  PT Treatments  $Therapeutic Activity 23-37 mins     08/12/2014 Veda CanningLynn Genny Caulder, PT Pager: [phone]

## 2014-08-12 NOTE — Consult Note (Signed)
Hospital Consult    Reason for Consult:  Carotid stenosis Referring Physician:  Hospitalist service MRN #:  [id number]  History of Present Illness: This is a 59 y.o. male who presented to the hospital with slurred speech and weakness.  He has a history of stroke in 2006 and 2007.  He has residual weakness on the left as well as some language difficulty.  He has undergone intracranial stenting in the past as well.  He complained of garbled speech as well as being unsteady on his feet.  He did fall.  He denies amaurosis fugax.  The patient has a history of peripheral vascular disease and has undergone left below knee amputation.  He also suffers from coronary artery disease, status post myocardial infarction.  He suffers from diabetes.  His most recent hemoglobin A1c was 8.3.  He is on a statin for hypercholesterolemia.  MRI when he presented suggested right sided carotid stenosis.  Past Medical History  Diagnosis Date  . Stroke   . Coronary artery disease   . Diabetes mellitus without complication   . Hypertension   . MI (myocardial infarction)   . Hyperlipidemia   . GERD (gastroesophageal reflux disease)   . Diabetic neuropathy   . Diabetic retinopathy     Past Surgical History  Procedure Laterality Date  . Below knee leg amputation Left   . Appendectomy    . Percutaneous placement intravascular stent cervical carotid artery Right     Allergies  Allergen Reactions  . Niaspan [Niacin Er] Other (See Comments)    Face was red and hot    Prior to Admission medications   Medication Sig Start Date End Date Taking? Authorizing Provider  aspirin 81 MG chewable tablet Chew 162 mg by mouth daily.   Yes Historical Provider, MD  atorvastatin (LIPITOR) 40 MG tablet Take 40 mg by mouth daily.   Yes Historical Provider, MD  cholecalciferol (VITAMIN D) 1000 UNITS tablet Take 2,000 Units by mouth 2 (two) times daily.   Yes Historical Provider, MD  furosemide (LASIX) 40 MG tablet Take 40  mg by mouth daily.   Yes Historical Provider, MD  gabapentin (NEURONTIN) 300 MG capsule Take 300 mg by mouth 4 (four) times daily.   Yes Historical Provider, MD  insulin aspart (NOVOLOG) 100 UNIT/ML injection Inject 15-20 Units into the skin See admin instructions. Take 15 units at breakfast and supper. Take 20 units at lunch.   Yes Historical Provider, MD  insulin glargine (LANTUS) 100 UNIT/ML injection Inject 45 Units into the skin 2 (two) times daily.   Yes Historical Provider, MD  lisinopril (PRINIVIL,ZESTRIL) 2.5 MG tablet Take 2.5 mg by mouth daily.   Yes Historical Provider, MD  metFORMIN (GLUCOPHAGE) 1000 MG tablet Take 1,000 mg by mouth 2 (two) times daily with a meal.   Yes Historical Provider, MD  metoprolol (LOPRESSOR) 50 MG tablet Take 25 mg by mouth 2 (two) times daily.   Yes Historical Provider, MD  Multiple Vitamin (MULTIVITAMIN WITH MINERALS) TABS tablet Take 1 tablet by mouth daily.   Yes Historical Provider, MD  oxyCODONE (ROXICODONE) 15 MG immediate release tablet Take 15 mg by mouth 3 (three) times daily.   Yes Historical Provider, MD    History   Social History  . Marital Status: Divorced    Spouse Name: N/A    Number of Children: N/A  . Years of Education: N/A   Occupational History  . Not on file.   Social History Main Topics  .  Smoking status: Former Games developermoker  . Smokeless tobacco: Not on file  . Alcohol Use: No  . Drug Use: Not on file  . Sexual Activity: Not on file   Other Topics Concern  . Not on file   Social History Narrative    Family history: No pertinent family history  Review of systems Please see history of present illness, otherwise no changes  Physical Examination  Filed Vitals:   08/12/14 1023  BP: 152/73  Pulse: 75  Temp: 98.4 F (36.9 C)  Resp: 16   Body mass index is 34.53 kg/(m^2).  General:  WDWN in NAD Gait: Not observed HENT: WNL, normocephalic Pulmonary: normal non-labored breathing, without Rales, rhonchi,   wheezing Cardiac: regular, without  Murmurs, rubs or gallops; without carotid bruits Abdomen: soft, NT/ND, no masses Skin: without rashes, without ulcers  Extremities: Left BKA Musculoskeletal: no muscle wasting or atrophy  Neurologic: A&O X 3; Appropriate Affect ; SENSATION: normal; MOTOR FUNCTION:  moving all extremities equally. Speech is fluent/normal Psychiatric:  Normal affect   CBC    Component Value Date/Time   WBC 7.3 08/10/2014 0400   RBC 3.76* 08/10/2014 0400   HGB 11.2* 08/10/2014 0400   HCT 33.5* 08/10/2014 0400   PLT 153 08/10/2014 0400   MCV 89.1 08/10/2014 0400   MCH 29.8 08/10/2014 0400   MCHC 33.4 08/10/2014 0400   RDW 16.9* 08/10/2014 0400   LYMPHSABS 1.8 08/09/2014 2036   MONOABS 0.6 08/09/2014 2036   EOSABS 0.2 08/09/2014 2036   BASOSABS 0.0 08/09/2014 2036    BMET    Component Value Date/Time   NA 142 08/11/2014 0710   K 4.0 08/11/2014 0710   CL 109 08/11/2014 0710   CO2 28 08/11/2014 0710   GLUCOSE 161* 08/11/2014 0710   BUN 22 08/11/2014 0710   CREATININE 1.00 08/11/2014 0710   CALCIUM 9.0 08/11/2014 0710   GFRNONAA 80* 08/11/2014 0710   GFRAA >90 08/11/2014 0710    COAGS: Lab Results  Component Value Date   INR 1.03 08/09/2014   INR 1.3 01/29/2008   INR 1.0 01/25/2008     Non-Invasive Vascular Imaging:   Carotid duplex 08/11/14: Right: moderate to severe calcfic plaque origin and proximal ICA. 40-59% ICA stenosis, highest end of scale. Left: Severe calcific plaque origin and proximal ICA. 80-99% ICA stenosis, highest end of scale. Vertebral artery flow is antegrade. ICA/CCA ratio: R-1.92 L-2.6.  IR Cerebral Angiogram 08/11/14: IMPRESSION: Approximately 75% narrowing of the left internal carotid artery petrous cavernous junction. This finding is new when compared to the previous angiogram.  Approximately 55% narrowing of the left internal carotid artery at the bulb by NASCET criteria. This has progressed compared to  the previous arteriogram.  Approximately 80% intra stent stenoses at the right internal carotid artery petrous cavernous junction, with a 70% stenoses which is new of the supraclinoid right ICA.  Nonvisualization of the right posterior inferior cerebellar artery, and of the left anterior inferior cerebellar artery.  Approximately 50% narrowing of the right posterior cerebral artery distal P1 segment.  IR Cerebral Angiogram 05/04/07 IMPRESSION:  1. Development of intrastent stenosis of 90% plus in the previous angioplastied stented segment of the right internal carotid artery in its petrous-cavernous junction. 2. Approximately 75% to 80% stenosis of the left internal carotid artery in the petrous-cavernous junction, which appears more prominent compared to the previous arteriograms. ADDENDUM: The above results were reviewed with the patient and his mother. The patient was informed that the development of the severe  intrastent stenosis probably needed either angioplasty or placement of another stent within this segment. The patient was also informed of possible worsening of the lesion of the left internal carotid artery in the petrous-cavernous junction. He has been asked to continue taking his aspirin and Plavix and maintain tight control of his diabetes and his hyperlipidemia. He will be scheduled for treatment of the intrastent stenosis of the right internal carotid artery in the petrous-cavernous junction in the next few weeks   MRI Brain 08/09/14: IMPRESSION: Advanced chronic ischemic changes affecting the pons and remainder the brainstem. Old deep brain infarctions including the left basal ganglia up. Chronic small vessel disease. No acute or subacute infarction or other reversible process.  MRI head/brain 08/10/14: IMPRESSION: Severe extracranial stenotic disease of the right internal carotid artery. 50% stenosis of the proximal right carotid canal. Very severe stenosis  at the proximal right siphon with luminal diameter 1 mm or less. Severe stenosis and irregularity throughout the right carotid siphon beyond that.  Moderate to severe atherosclerotic disease in the left carotid siphon. Pronounced irregularity of the vessel, but with maximal stenosis estimated at 50-70%.  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  No correctable proximal MCA stenosis.  Distal vessel atherosclerotic narrowing and irregularity, particularly evident in the posterior circulation branches.   Statin:  Yes.   Beta Blocker:  Yes.   Aspirin:  Yes.   ACEI:  No. ARB:  No. Other antiplatelets/anticoagulants:  Yes.   Plavix   ASSESSMENT/PLAN: This is a 59 y.o. male with admission of slurred speech with possible TIA and MRI revealing multiple old strokes.    Carotid duplex shows 40-59 percent right carotid stenosis and 80-99 percent left carotid stenosis.  The patient went on to have catheter based angiography.  This showed approximately 55% stenosis of the left internal carotid artery within the neck.  No significant stenosis was seen in the right internal carotid artery within the neck.  Based on the angiographic findings, there is no role for vascular surgery at this time.  The patient should continue with risk factor modification as well as optimal medical therapy.  He will need surveillance ultrasound imaging.  I will have this arranged in our office in 6 months.   Doreatha Massed, PA-C [facility] [phone]

## 2014-08-12 NOTE — Procedures (Signed)
Objective Swallowing Evaluation: Modified Barium Swallowing Study  Patient Details  Name: Clayton [name] MRN: [id number] Date of Birth: 07/06/1956  Today's Date: 08/12/2014 Time: SLP Start Time (ACUTE ONLY): 1030-SLP Stop Time (ACUTE ONLY): 1100 SLP Time Calculation (min) (ACUTE ONLY): 30 min  Past Medical History:  Past Medical History  Diagnosis Date  . Stroke   . Coronary artery disease   . Diabetes mellitus without complication   . Hypertension   . MI (myocardial infarction)   . Hyperlipidemia   . GERD (gastroesophageal reflux disease)   . Diabetic neuropathy   . Diabetic retinopathy    Past Surgical History:  Past Surgical History  Procedure Laterality Date  . Below knee leg amputation Left   . Appendectomy    . Percutaneous placement intravascular stent cervical carotid artery Right    HPI:  HPI: 59 yo man with PMH of stroke, CAD, DM with neuropathy, s/p Left BKA, HTN, GERD, HLD, h/o MI who presents with slurred speech and weakness. Mr. Merced has a long history of neurological symptoms. He had a stroke in 2006 and then in 2007. Since that time he has had residual weakness on the left and speech issues. Jan29 noticed that his speech became more garbled and he also began to feel more unsteady, with worsening weakness next 24 hours. MRI reveals advanced chronic ischemic changes affecting the pons and remainder the brainstem. 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 Chronic small vessel disease. No acute or subacute infarction.    No Data Recorded  Assessment / Plan / Recommendation CHL IP CLINICAL IMPRESSIONS 08/12/2014  Dysphagia Diagnosis Mild pharyngeal phase dysphagia  Clinical impression Pt presents with a likely acute-on-chronic mild dysphagia marked by reduced anterior/superior movement of the hyolaryngeal complex.  This deficit leads to high penetration of thin and nectar-thick liquids, and trace aspiration of thin liquids when consumed in large,  consecutive boluses.  When pt limited sips of thin to singular, smaller boluses, aspiration did not occur.  Pt viewed the MBS in real time - we discussed the necessity of monitoring bolus quantity and rate of consumption.  Pt verbalized understanding.  Recommend dysphagia 3, thin liquids; continue meds whole in puree.  SLP to follow briefly for use of precautions as instructed.        CHL IP TREATMENT RECOMMENDATION 08/12/2014  Treatment Plan Recommendations Therapy as outlined in treatment plan below     CHL IP DIET RECOMMENDATION 08/12/2014  Diet Recommendations Dysphagia 3 (Mechanical Soft);Thin liquid  Liquid Administration via Cup  Medication Administration Whole meds with puree  Compensations Slow rate;Small sips/bites;Clear throat intermittently  Postural Changes and/or Swallow Maneuvers Seated upright 90 degrees     CHL IP OTHER RECOMMENDATIONS 08/12/2014  Recommended Consults (None)  Oral Care Recommendations Oral care BID  Other Recommendations (None)     CHL IP FOLLOW UP RECOMMENDATIONS 08/12/2014  Follow up Recommendations None     CHL IP FREQUENCY AND DURATION 08/10/2014  Speech Therapy Frequency (ACUTE ONLY) min 2x/week  Treatment Duration 2 weeks         SLP Swallow Goals No flowsheet data found.  No flowsheet data found.    CHL IP REASON FOR REFERRAL 08/12/2014  Reason for Referral Objectively evaluate swallowing function     CHL IP ORAL PHASE 08/12/2014  Lips (None)  Tongue (None)  Mucous membranes (None)  Nutritional status (None)  Other (None)  Oxygen therapy (None)  Oral Phase WFL  Oral - Pudding Teaspoon (None)  Oral -  Pudding Cup (None)  Oral - Honey Teaspoon (None)  Oral - Honey Cup (None)  Oral - Honey Syringe (None)  Oral - Nectar Teaspoon (None)  Oral - Nectar Cup (None)  Oral - Nectar Straw (None)  Oral - Nectar Syringe (None)  Oral - Ice Chips (None)  Oral - Thin Teaspoon (None)  Oral - Thin Cup (None)  Oral - Thin Straw (None)  Oral - Thin  Syringe (None)  Oral - Puree (None)  Oral - Mechanical Soft (None)  Oral - Regular (None)  Oral - Multi-consistency (None)  Oral - Pill (None)  Oral Phase - Comment (None)      CHL IP PHARYNGEAL PHASE 08/12/2014  Pharyngeal Phase Impaired  Pharyngeal - Pudding Teaspoon (None)  Penetration/Aspiration details (pudding teaspoon) (None)  Pharyngeal - Pudding Cup (None)  Penetration/Aspiration details (pudding cup) (None)  Pharyngeal - Honey Teaspoon (None)  Penetration/Aspiration details (honey teaspoon) (None)  Pharyngeal - Honey Cup (None)  Penetration/Aspiration details (honey cup) (None)  Pharyngeal - Honey Syringe (None)  Penetration/Aspiration details (honey syringe) (None)  Pharyngeal - Nectar Teaspoon (None)  Penetration/Aspiration details (nectar teaspoon) (None)  Pharyngeal - Nectar Cup Reduced anterior laryngeal mobility;Reduced laryngeal elevation;Penetration/Aspiration during swallow  Penetration/Aspiration details (nectar cup) Material enters airway, remains ABOVE vocal cords then ejected out  Pharyngeal - Nectar Straw (None)  Penetration/Aspiration details (nectar straw) (None)  Pharyngeal - Nectar Syringe (None)  Penetration/Aspiration details (nectar syringe) (None)  Pharyngeal - Ice Chips (None)  Penetration/Aspiration details (ice chips) (None)  Pharyngeal - Thin Teaspoon (None)  Penetration/Aspiration details (thin teaspoon) (None)  Pharyngeal - Thin Cup Reduced anterior laryngeal mobility;Reduced laryngeal elevation;Penetration/Aspiration during swallow  Penetration/Aspiration details (thin cup) Material enters airway, passes BELOW cords and not ejected out despite cough attempt by patient;Material enters airway, passes BELOW cords without attempt by patient to eject out (silent aspiration)  Pharyngeal - Thin Straw (None)  Penetration/Aspiration details (thin straw) (None)  Pharyngeal - Thin Syringe (None)  Penetration/Aspiration details (thin syringe') (None)   Pharyngeal - Puree Reduced anterior laryngeal mobility;Reduced laryngeal elevation;Pharyngeal residue - valleculae  Penetration/Aspiration details (puree) (None)  Pharyngeal - Mechanical Soft Reduced anterior laryngeal mobility;Reduced laryngeal elevation;Pharyngeal residue - valleculae  Penetration/Aspiration details (mechanical soft) (None)  Pharyngeal - Regular (None)  Penetration/Aspiration details (regular) (None)  Pharyngeal - Multi-consistency (None)  Penetration/Aspiration details (multi-consistency) (None)  Pharyngeal - Pill (None)  Penetration/Aspiration details (pill) (None)  Pharyngeal Comment (None)     No flowsheet data found.  CHL IP GO 08/12/2014  Functional Assessment Tool Used clinical judgement  Functional Limitations Swallowing  Swallow Current Status [phone]) CJ  Swallow Goal Status (U0454) CI  Swallow Discharge Status (U9811) (None)  Motor Speech Current Status [phone]) (None)  Motor Speech Goal Status (G9562) (None)  Motor Speech Goal Status (Z3086) (None)  Spoken Language Comprehension Current Status (V7846) (None)  Spoken Language Comprehension Goal Status (N6295) (None)  Spoken Language Comprehension Discharge Status [phone]) (None)  Spoken Language Expression Current Status [phone]) (None)  Spoken Language Expression Goal Status [phone]) (None)  Spoken Language Expression Discharge Status [phone]) (None)  Attention Current Status (I3474) (None)  Attention Goal Status (Q5956) (None)  Attention Discharge Status [phone]) (None)  Memory Current Status (E3329) (None)  Memory Goal Status (J1884) (None)  Memory Discharge Status (Z6606) (None)  Voice Current Status (T0160) (None)  Voice Goal Status (F0932) (None)  Voice Discharge Status (T5573) (None)  Other Speech-Language Pathology Functional Limitation [phone]) (None)  Other Speech-Language Pathology Functional Limitation Goal Status (K2706) (None)  Other Speech-Language  Pathology Functional Limitation Discharge  Status [phone](G9176) (None)          Tatum Massman L. Samson Fredericouture, [place] CCC/SLP Pager [phone]  Blenda MountsCouture, [place] 08/12/2014, 11:29 AM

## 2014-08-12 NOTE — Progress Notes (Signed)
Received several text from tele. Stating that patients HR was in the 140's to 170's, I checked on patient multiple times, pt. was asymptomatic

## 2014-08-12 NOTE — Progress Notes (Signed)
UR completed 

## 2014-08-12 NOTE — Progress Notes (Signed)
Patient ID: Clayton [name], male   DOB: Dec 31, 1955, 59 y.o.   MRN: [id number]   Dr Corliss Skainseveshwar at bedside to discuss arteriogram findings B carotid artery stenosis noted Rt Internal carotid intrastent and beyond stent stenosis. Discussed with pt to start with treatment of this first.  All questions were answered. Pt is aware risk of CVA -- 3% with this procedure  He will discuss with family and have a decision for us 08/13/2014

## 2014-08-12 NOTE — Telephone Encounter (Signed)
Left msg for pt at home #- was later told that he is at [facility]. I lm with the RN there, dpm

## 2014-08-12 NOTE — Progress Notes (Signed)
CCMD notified patient's HR 181. Patient was resting in room, telemetry box had HR at 76. Patient in no signs of distress, asking for assistance with setting food tray up. Call bell within patient's reach, will continue to monitor.

## 2014-08-12 NOTE — Clinical Social Work Placement (Addendum)
Clinical Social Work Department CLINICAL SOCIAL WORK PLACEMENT NOTE 08/12/2014  Patient:  Clayton [name],Clayton [name]  Account Number:  [id number] Admit date:  08/09/2014  Clinical Social Worker:  Derenda FennelBASHIRA Erryn Dickison, CLINICAL SOCIAL WORKER  Date/time:  08/12/2014 01:33 PM  Clinical Social Work is seeking post-discharge placement for this patient at the following level of care:   SKILLED NURSING   (*CSW will update this form in Epic as items are completed)   08/12/2014  Patient/family provided with [facility] Department of Clinical Social Work's list of facilities offering this level of care within the geographic area requested by the patient (or if unable, by the patient's family).  08/12/2014  Patient/family informed of their freedom to choose among providers that offer the needed level of care, that participate in Medicare, Medicaid or managed care program needed by the patient, have an available bed and are willing to accept the patient.  08/12/2014  Patient/family informed of [facility]' ownership interest in [facility], as well as of the fact that they are under no obligation to receive care at this facility.  PASARR submitted to EDS on 08/12/2014 PASARR number received on 08/12/2014  FL2 transmitted to all facilities in geographic area requested by pt/family on  08/12/2014 FL2 transmitted to all facilities within larger geographic area on   Patient informed that his/her managed care company has contracts with or will negotiate with  certain facilities, including the following:   YES     Patient/family informed of bed offers received:  08/12/2014 [facility] Physician recommends and patient chooses bed at    [facility] on 08/15/2014  Patient to be transferred to facility by [facility]  Patient and family notified of transfer on 08/15/2014  Name of family member notified:  Pt's sister, Arline AspCindy   The  following physician request were entered in Epic:   Additional Comments:   Derenda FennelBashira Joniece Smotherman, MSW, LCSWA [phone] 08/12/2014 1:34 PM

## 2014-08-12 NOTE — Clinical Social Work Note (Signed)
Clinical Social Worker has assessed patient. Full psychosocial to follow.  Derenda FennelBashira Kalise Fickett, MSW, LCSWA [phone] 08/12/2014 1:16 PM

## 2014-08-12 NOTE — Progress Notes (Addendum)
Patient Demographics  Clayton [name], is a 59 y.o. male, DOB - [birth year], ZOX:[id number]  Admit date - 08/09/2014   Admitting Physician Inez Catalina, MD  Outpatient Primary MD for the patient is No primary care provider on file.  LOS - 3   Chief Complaint  Patient presents with  . stroke symptoms       Brief summary  59 year old Caucasian male with history of stroke, CAD, diabetic neuropathy, diabetes in poor control, left BKA, right transmetatarsal amputation, hypertension, dyslipidemia who presented to the hospital with chief complaints of slurred speech and generalized weakness. CT and MRI brain were nonacute. He had evidence of mild troponin rise due to acute renal failure. Renal failure has resolved, he had no chest symptoms.   He did have evidence of severe intracranial carotid artery stenosis for which he is being seen by interventional radiology and underwent angiogram on 08/11/2014, he is to be denuded on aspirin and Plavix and statin for now, interventional radiology is planning stent placement possibly later this admission. He did not have acute stroke.    Subjective:   Clayton [name] today has, No headache, No chest pain, No abdominal pain - No Nausea, No new weakness tingling or numbness, No Cough - SOB. Resolved weakness and speech problems  Assessment & Plan    1. Slurred speech with some generalized nonfocal weakness. 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 MRA brain suggests severe bilateral carotid artery disease. To be seen by PT OT speech, A1c noted, echogramnon acute. LDL under 70, on aspirin statin continue.  Lab Results  Component Value Date   HGBA1C 8.3* 08/11/2014     2. Bilateral carotid artery disease on MRA.  Continue aspirin and statin for secondary prevention, or vascular most lesions intracranial, have requested IR to evaluate. Carotid duplex pending.   3. Acute Renal failure. Last creatinine in the system is 0.9 but it is 59 years old. Resolved with hydration.   4. CAD with Borderline Trop - pain and symptom free, EKG non acute, on ASA- statin, check Echo , add low dose Coreg. Appointment trend is non-ACS pattern. Likely minimal elevation due to renal insufficiency. Will need outpatient cardiology follow-up.   5.DM-2 - will add low-dose Lantus for better control, continue sliding scale.  Lab Results  Component Value Date   HGBA1C 8.3* 08/11/2014    CBG (last 3)   Recent Labs  08/11/14 2054 08/11/14 2349 08/12/14 0416  GLUCAP 209* 142* 113*    6. Diabetic neuropathy. Continue Neurontin.   7. Dyslipidemia. On statin. LDL less than 70.   8. Mild dysphagia. Followed by speech. DYSPHAGIA 3 diet.    Code Status: Full  Family Communication: None  Disposition Plan: Home   Procedures   CT head, MRI/MRA brain, carotid duplex, echogram   Consults     Vascular, IR - Neurology  Medications  Scheduled Meds: . aspirin  325 mg Oral Daily  . atorvastatin  40 mg Oral Daily  . carvedilol  3.125 mg Oral BID WC  . cholecalciferol  2,000 Units Oral BID  . clopidogrel  75 mg Oral Daily  . gabapentin  600 mg Oral QID  . heparin  5,000 Units  Subcutaneous 3 times per day  . insulin aspart  0-15 Units Subcutaneous 6 times per day  . insulin glargine  30 Units Subcutaneous Daily   Continuous Infusions: . sodium chloride     PRN Meds:.acetaminophen, HYDROcodone-acetaminophen, RESOURCE THICKENUP CLEAR  DVT Prophylaxis  Heparin   Lab Results  Component Value Date   PLT 153 08/10/2014    Antibiotics    Anti-infectives    None          Objective:   Filed Vitals:   08/11/14 1453 08/11/14 1739 08/11/14 2233 08/12/14 0206  BP: 155/63 158/74 151/89 136/69  Pulse:  78 71 72 67  Temp:  98.4 F (36.9 C) 98.6 F (37 C) 98.2 F (36.8 C)  TempSrc:  Oral Oral Oral  Resp: 18 18 18 16   Height:      Weight:      SpO2: 100% 100% 100% 98%    Wt Readings from Last 3 Encounters:  08/10/14 103 kg (227 lb 1.2 oz)     Intake/Output Summary (Last 24 hours) at 08/12/14 0908 Last data filed at 08/11/14 2154  Gross per 24 hour  Intake      0 ml  Output    900 ml  Net   -900 ml     Physical Exam  Awake Alert, Oriented X 3, No new F.N deficits, Normal affect [place].AT,PERRAL Supple Neck,No JVD, No cervical lymphadenopathy appriciated.  Symmetrical Chest wall movement, Good air movement bilaterally, CTAB RRR,No Gallops,Rubs or new Murmurs, No Parasternal Heave +ve B.Sounds, Abd Soft, No tenderness, No organomegaly appriciated, No rebound - guarding or rigidity. No Cyanosis, Clubbing or edema, No new Rash or bruise , L BKA, R transmetatarsal amputation.   Data Review   Micro Results Recent Results (from the past 240 hour(s))  Urine culture     Status: None (Preliminary result)   Collection Time: 08/10/14  2:12 PM  Result Value Ref Range Status   Specimen Description URINE, RANDOM  Final   Special Requests NONE  Final   Colony Count   Final    20,OOO COLONIES/ML Performed at [facility]    Culture   Final    GRAM NEGATIVE RODS Performed at [facility]    Report Status PENDING  Incomplete    Radiology Reports Dg Chest 2 View  08/09/2014   CLINICAL DATA:  Unable to walk since yesterday. Weakness and unable to stand. Speech difficulty yesterday that better now. Face is non today. History of prior stroke, MI, CABG, coronary artery disease, diabetes.  EXAM: CHEST  2 VIEW  COMPARISON:  03/18/2008  FINDINGS: Postoperative changes in the mediastinum. Normal heart size and pulmonary vascularity. No focal airspace disease or consolidation in the lungs. No blunting of costophrenic angles. No pneumothorax. Mediastinal contours appear intact.  Degenerative changes in the spine and shoulders.  IMPRESSION: No active cardiopulmonary disease.   Electronically Signed   By: Burman Nieves M.D.   On: 08/09/2014 22:45   Ct Head Wo Contrast  08/09/2014   CLINICAL DATA:  Weakness and numbness  EXAM: CT HEAD WITHOUT CONTRAST  TECHNIQUE: Contiguous axial images were obtained from the base of the skull through the vertex without intravenous contrast.  COMPARISON:  06/14/2007  FINDINGS: There is no intracranial hemorrhage, mass or evidence of acute infarction. There is old infarction with encephalomalacia in the left centrum semiovale. There is old infarction in the pons, right greater than left. These are unchanged from 2008. Additional smaller old infarctions are present  in the periventricular frontal white matter, right greater than left. There is mild generalized atrophy and mild chronic microvascular ischemic disease. There is no extra-axial fluid collection. Calvarium and skullbase are intact.  IMPRESSION: Mild atrophy and chronic microvascular ischemic disease. 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 No acute findings are evident.   Electronically Signed   By: Ellery Plunk M.D.   On: 08/09/2014 22:30   Mri Brain Without Contrast  08/10/2014   CLINICAL DATA:  Personal previous history of stroke and vascular disease. Acute presentation with slurred speech and weakness. Symptoms began last night.  EXAM: MRI HEAD WITHOUT CONTRAST  TECHNIQUE: Multiplanar, multiecho pulse sequences of the brain and surrounding structures were obtained without intravenous contrast.  COMPARISON:  Head CT 08/09/2014 in 06/14/2007.  MRI 01/17/2006.  FINDINGS: Diffusion imaging does not show any acute or subacute infarction. There is extensive old infarction throughout the pons bilaterally. There are old small vessel cerebellar infarctions in their cerebellar atrophy. There are old infarctions in the left basal ganglia and radiating white matter tracts and moderate  chronic small-vessel ischemic changes throughout the cerebral hemispheric white matter. No cortical infarction is seen within the cerebral hemispheres. No neoplastic mass lesion, hemorrhage, hydrocephalus or extra-axial collection. No pituitary mass. No inflammatory sinus disease. No skull or skullbase lesion. Major vessels at the base of the brain show flow.  IMPRESSION: Advanced chronic ischemic changes affecting the pons and remainder the brainstem. Old deep brain infarctions including the left basal ganglia up. Chronic small vessel disease. No acute or subacute infarction or other reversible process.   Electronically Signed   By: Paulina Fusi M.D.   On: 08/10/2014 07:34   Mr Maxine Glenn Head/brain Wo Cm  08/10/2014   CLINICAL DATA:  Widespread cardiovascular disease. Slurred speech and weakness. Chronic ischemic changes of the brain particularly in the pons.  EXAM: MRA HEAD WITHOUT CONTRAST  TECHNIQUE: Angiographic images of the Circle of Willis were obtained using MRA technique without intravenous contrast.  COMPARISON:  MRI brain same day.  Angiography 06/14/2007.  FINDINGS: Severe stenosis of the right internal carotid artery at the skullbase is again demonstrated. At the proximal carotid canal, there is a 50% diameter stenosis. At the proximal siphon, the lumen is reduced to 1 mm or less, but antegrade flow does persist. Beyond that, there is pronounced atherosclerotic irregularity in the siphon region. At the distal siphon/ supra clinoid internal carotid artery, there is severe stenosis and irregularity with luminal diameter of 1 mm. No flow is seen in the right anterior cerebral artery. Right middle cerebral artery is patent without proximal stenosis.  The left internal carotid artery is widely patent through the carotid canal. There is advanced atherosclerotic disease in the siphon region with narrowing and irregularity particularly in the proximal siphon. Stenosis in that region is estimated at 50-70%.  Pronounced irregularity is seen beyond that. Narrowing of the distal siphon is approximately 30%. The anterior and middle cerebral vessels are patent, with flow from this site to supplying both anterior cerebral arteries. There is no proximal left middle cerebral artery stenosis.  Both vertebral arteries are widely patent through the foramen magnum to the basilar. The right is larger than the left. No basilar stenosis. Flow is demonstrated in a left posterior inferior cerebellar artery, bilateral anterior inferior cerebellar arteries, bilateral superior cerebellar arteries, and both posterior cerebral arteries. Medium to small branch vessels show atherosclerotic narrowing and irregularity.  IMPRESSION: Severe extracranial stenotic disease of the right internal carotid artery. 50% stenosis  of the proximal right carotid canal. Very severe stenosis at the proximal right siphon with luminal diameter 1 mm or less. Severe stenosis and irregularity throughout the right carotid siphon beyond that.  Moderate to severe atherosclerotic disease in the left carotid siphon. Pronounced irregularity of the vessel, but with maximal stenosis estimated at 50-70%.  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  No correctable proximal MCA stenosis.  Distal vessel atherosclerotic narrowing and irregularity, particularly evident in the posterior circulation branches.   Electronically Signed   By: Paulina Fusi M.D.   On: 08/10/2014 09:31     CBC  Recent Labs Lab 08/09/14 2036 08/09/14 2203 08/10/14 0400  WBC 9.8  --  7.3  HGB 12.5* 12.6* 11.2*  HCT 36.6* 37.0* 33.5*  PLT 193  --  153  MCV 87.4  --  89.1  MCH 29.8  --  29.8  MCHC 34.2  --  33.4  RDW 16.9*  --  16.9*  LYMPHSABS 1.8  --   --   MONOABS 0.6  --   --   EOSABS 0.2  --   --   BASOSABS 0.0  --   --     Chemistries   Recent Labs Lab 08/09/14 2036 08/09/14 2203  08/10/14 0400 08/11/14 0710  NA 136 137  --  142  K 3.9 4.1  --  4.0  CL 100 98  --  109  CO2 23  --   --  28  GLUCOSE 330* 362*  --  161*  BUN 38* 41*  --  22  CREATININE 1.51* 1.50* 1.50* 1.00  CALCIUM 9.0  --   --  9.0  AST 44*  --   --   --   ALT 22  --   --   --   ALKPHOS 83  --   --   --   BILITOT 1.1  --   --   --    ------------------------------------------------------------------------------------------------------------------ estimated creatinine clearance is 91.3 mL/min (by C-G formula based on Cr of 1). ------------------------------------------------------------------------------------------------------------------  Recent Labs  08/10/14 0400 08/11/14 1030  HGBA1C 8.3* 8.3*   ------------------------------------------------------------------------------------------------------------------  Recent Labs  08/10/14 0400  CHOL 128  HDL 21*  LDLCALC 59  TRIG 161*  CHOLHDL 6.1   ------------------------------------------------------------------------------------------------------------------ No results for input(s): TSH, T4TOTAL, T3FREE, THYROIDAB in the last 72 hours.  Invalid input(s): FREET3 ------------------------------------------------------------------------------------------------------------------ No results for input(s): VITAMINB12, FOLATE, FERRITIN, TIBC, IRON, RETICCTPCT in the last 72 hours.  Coagulation profile  Recent Labs Lab 08/09/14 2036  INR 1.03    No results for input(s): DDIMER in the last 72 hours.  Cardiac Enzymes  Recent Labs Lab 08/10/14 0400 08/10/14 0737 08/10/14 1355  TROPONINI 0.04* 0.04* 0.03   ------------------------------------------------------------------------------------------------------------------ Invalid input(s): POCBNP     Time Spent in minutes  35   SINGH,PRASHANT K M.D on 08/12/2014 at 9:08 AM  Between 7am to 7pm - Pager - [phone]  After 7pm go to [email or web address] - password [facility]  [facility] Office  [phone]  Patient Demographics  Clayton [name], is a 59 y.o. male, DOB - 1956-05-17, ZOX:[id number]  Admit date - 08/09/2014   Admitting Physician Inez Catalina, MD  Outpatient Primary MD for the patient is No primary care provider on file.  LOS - 3   Chief Complaint  Patient presents with  . stroke symptoms         Subjective:   Clayton [name] today has, No headache, No chest pain, No abdominal pain - No Nausea, No new weakness tingling or numbness, No Cough - SOB. Resolved weakness and speech problems  Assessment & Plan    1. Slurred speech with some generalized nonfocal weakness. 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 MRA brain suggests severe bilateral internal intracranial carotid artery disease. To be seen by PT OT speech, A1c noted, echogram non acute. LDL under 70, on aspirin, plavix & statin continue.  Underwent intracranial carotid angiogram by interventional radiologist Dr. Corliss Skains, discussed the case with him today, he is going to reevaluate the patient and then consider stenting.    Lab Results  Component Value Date   HGBA1C 8.3* 08/11/2014     2. Bilateral carotid artery disease on MRA. Continue aspirin and statin for secondary prevention, or vascular most lesions intracranial, have requested IR to evaluate. Carotid duplex pending.   3. Acute Renal failure. Last creatinine in the system is 0.9 but it is 59 years old. Resolved with hydration.   4. CAD with Borderline Trop - pain and symptom free, EKG non acute, on ASA, Plavix - statin,noted Echo , added low dose Coreg. Trop trend is non-ACS pattern. Likely minimal elevation due to renal insufficiency. Will need outpatient cardiology  follow-up.   5.DM-2 - added low-dose Lantus for better control, continue sliding scale.  Lab Results  Component Value Date   HGBA1C 8.3* 08/11/2014    CBG (last 3)   Recent Labs  08/11/14 2054 08/11/14 2349 08/12/14 0416  GLUCAP 209* 142* 113*    6. Diabetic neuropathy. Continue Neurontin.   7. Dyslipidemia. On statin. LDL less than 70.    Code Status: Full  Family Communication: None  Disposition Plan: Home   Procedures   CT head, MRI/MRA brain, carotid duplex, echogram  TTE  - Left ventricle: The cavity size was normal. Wall thickness was normal. Systolic function was normal. The estimated ejection fraction was in the range of 55% to 60%. Hypokinesis of the apical myocardium. Features are consistent with a pseudonormal left ventricular filling pattern, with concomitant abnormal relaxation and increased filling pressure (grade 2 diastolic dysfunction). Acoustic contrast opacification revealed no evidence ofthrombus. - Ventricular septum: Septal motion showed paradox. - Left atrium: The atrium was moderately dilated.    Carotid Angiogram  S/P 4 vessel cerebral arteriogram. 08-11-14 Rt CFA approach. Findings. 1.Approx 75 to 80 % intrastent stenosis with RT ICA ,and new approx 70 % stenosis Rt ICa suraclinoid ICa 2.Approx 70 to 75 % stenosis of Lt ICa petrous cav seg and 55 % stenosis of LT ICA prox .              Consults     Vascular, IR - Neurology, Cards  Medications  Scheduled Meds: . aspirin  325 mg Oral Daily  . atorvastatin  40 mg Oral Daily  . carvedilol  3.125 mg Oral BID WC  . cholecalciferol  2,000 Units Oral BID  . clopidogrel  75 mg Oral Daily  . gabapentin  600 mg Oral  QID  . heparin  5,000 Units Subcutaneous 3 times per day  . insulin aspart  0-15 Units Subcutaneous 6 times per day  . insulin glargine  30 Units Subcutaneous Daily   Continuous Infusions: . sodium chloride     PRN  Meds:.acetaminophen, HYDROcodone-acetaminophen, RESOURCE THICKENUP CLEAR  DVT Prophylaxis  Heparin   Lab Results  Component Value Date   PLT 153 08/10/2014    Antibiotics    Anti-infectives    None          Objective:   Filed Vitals:   08/11/14 1453 08/11/14 1739 08/11/14 2233 08/12/14 0206  BP: 155/63 158/74 151/89 136/69  Pulse: 78 71 72 67  Temp:  98.4 F (36.9 C) 98.6 F (37 C) 98.2 F (36.8 C)  TempSrc:  Oral Oral Oral  Resp: 18 18 18 16   Height:      Weight:      SpO2: 100% 100% 100% 98%    Wt Readings from Last 3 Encounters:  08/10/14 103 kg (227 lb 1.2 oz)     Intake/Output Summary (Last 24 hours) at 08/12/14 0909 Last data filed at 08/11/14 2154  Gross per 24 hour  Intake      0 ml  Output    900 ml  Net   -900 ml     Physical Exam  Awake Alert, Oriented X 3, No new F.N deficits, Normal affect [place].AT,PERRAL Supple Neck,No JVD, No cervical lymphadenopathy appriciated.  Symmetrical Chest wall movement, Good air movement bilaterally, CTAB RRR,No Gallops,Rubs or new Murmurs, No Parasternal Heave +ve B.Sounds, Abd Soft, No tenderness, No organomegaly appriciated, No rebound - guarding or rigidity. No Cyanosis, Clubbing or edema, No new Rash or bruise , L BKA, R transmetatarsal amputation.   Data Review   Micro Results Recent Results (from the past 240 hour(s))  Urine culture     Status: None (Preliminary result)   Collection Time: 08/10/14  2:12 PM  Result Value Ref Range Status   Specimen Description URINE, RANDOM  Final   Special Requests NONE  Final   Colony Count   Final    20,OOO COLONIES/ML Performed at [facility]    Culture   Final    GRAM NEGATIVE RODS Performed at [facility]    Report Status PENDING  Incomplete    Radiology Reports Dg Chest 2 View  08/09/2014   CLINICAL DATA:  Unable to walk since yesterday. Weakness and unable to stand. Speech difficulty yesterday that better now. Face is non today.  History of prior stroke, MI, CABG, coronary artery disease, diabetes.  EXAM: CHEST  2 VIEW  COMPARISON:  03/18/2008  FINDINGS: Postoperative changes in the mediastinum. Normal heart size and pulmonary vascularity. No focal airspace disease or consolidation in the lungs. No blunting of costophrenic angles. No pneumothorax. Mediastinal contours appear intact. Degenerative changes in the spine and shoulders.  IMPRESSION: No active cardiopulmonary disease.   Electronically Signed   By: Burman NievesWilliam  Stevens M.D.   On: 08/09/2014 22:45   Ct Head Wo Contrast  08/09/2014   CLINICAL DATA:  Weakness and numbness  EXAM: CT HEAD WITHOUT CONTRAST  TECHNIQUE: Contiguous axial images were obtained from the base of the skull through the vertex without intravenous contrast.  COMPARISON:  06/14/2007  FINDINGS: There is no intracranial hemorrhage, mass or evidence of acute infarction. There is old infarction with encephalomalacia in the left centrum semiovale. There is old infarction in the pons, right greater than left. These are unchanged from  2008. Additional smaller old infarctions are present in the periventricular frontal white matter, right greater than left. There is mild generalized atrophy and mild chronic microvascular ischemic disease. There is no extra-axial fluid collection. Calvarium and skullbase are intact.  IMPRESSION: Mild atrophy and chronic microvascular ischemic disease. 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 No acute findings are evident.   Electronically Signed   By: Ellery Plunk M.D.   On: 08/09/2014 22:30   Mri Brain Without Contrast  08/10/2014   CLINICAL DATA:  Personal previous history of stroke and vascular disease. Acute presentation with slurred speech and weakness. Symptoms began last night.  EXAM: MRI HEAD WITHOUT CONTRAST  TECHNIQUE: Multiplanar, multiecho pulse sequences of the brain and surrounding structures were obtained without intravenous contrast.  COMPARISON:  Head CT  08/09/2014 in 06/14/2007.  MRI 01/17/2006.  FINDINGS: Diffusion imaging does not show any acute or subacute infarction. There is extensive old infarction throughout the pons bilaterally. There are old small vessel cerebellar infarctions in their cerebellar atrophy. There are old infarctions in the left basal ganglia and radiating white matter tracts and moderate chronic small-vessel ischemic changes throughout the cerebral hemispheric white matter. No cortical infarction is seen within the cerebral hemispheres. No neoplastic mass lesion, hemorrhage, hydrocephalus or extra-axial collection. No pituitary mass. No inflammatory sinus disease. No skull or skullbase lesion. Major vessels at the base of the brain show flow.  IMPRESSION: Advanced chronic ischemic changes affecting the pons and remainder the brainstem. Old deep brain infarctions including the left basal ganglia up. Chronic small vessel disease. No acute or subacute infarction or other reversible process.   Electronically Signed   By: Paulina Fusi M.D.   On: 08/10/2014 07:34   Mr Maxine Glenn Head/brain Wo Cm  08/10/2014   CLINICAL DATA:  Widespread cardiovascular disease. Slurred speech and weakness. Chronic ischemic changes of the brain particularly in the pons.  EXAM: MRA HEAD WITHOUT CONTRAST  TECHNIQUE: Angiographic images of the Circle of Willis were obtained using MRA technique without intravenous contrast.  COMPARISON:  MRI brain same day.  Angiography 06/14/2007.  FINDINGS: Severe stenosis of the right internal carotid artery at the skullbase is again demonstrated. At the proximal carotid canal, there is a 50% diameter stenosis. At the proximal siphon, the lumen is reduced to 1 mm or less, but antegrade flow does persist. Beyond that, there is pronounced atherosclerotic irregularity in the siphon region. At the distal siphon/ supra clinoid internal carotid artery, there is severe stenosis and irregularity with luminal diameter of 1 mm. No flow is seen in  the right anterior cerebral artery. Right middle cerebral artery is patent without proximal stenosis.  The left internal carotid artery is widely patent through the carotid canal. There is advanced atherosclerotic disease in the siphon region with narrowing and irregularity particularly in the proximal siphon. Stenosis in that region is estimated at 50-70%. Pronounced irregularity is seen beyond that. Narrowing of the distal siphon is approximately 30%. The anterior and middle cerebral vessels are patent, with flow from this site to supplying both anterior cerebral arteries. There is no proximal left middle cerebral artery stenosis.  Both vertebral arteries are widely patent through the foramen magnum to the basilar. The right is larger than the left. No basilar stenosis. Flow is demonstrated in a left posterior inferior cerebellar artery, bilateral anterior inferior cerebellar arteries, bilateral superior cerebellar arteries, and both posterior cerebral arteries. Medium to small branch vessels show atherosclerotic narrowing and irregularity.  IMPRESSION: Severe extracranial stenotic disease of  the right internal carotid artery. 50% stenosis of the proximal right carotid canal. Very severe stenosis at the proximal right siphon with luminal diameter 1 mm or less. Severe stenosis and irregularity throughout the right carotid siphon beyond that.  Moderate to severe atherosclerotic disease in the left carotid siphon. Pronounced irregularity of the vessel, but with maximal stenosis estimated at 50-70%.  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  No correctable proximal MCA stenosis.  Distal vessel atherosclerotic narrowing and irregularity, particularly evident in the posterior circulation branches.   Electronically Signed   By: Paulina Fusi M.D.   On: 08/10/2014 09:31     CBC  Recent Labs Lab 08/09/14 2036 08/09/14 2203  08/10/14 0400  WBC 9.8  --  7.3  HGB 12.5* 12.6* 11.2*  HCT 36.6* 37.0* 33.5*  PLT 193  --  153  MCV 87.4  --  89.1  MCH 29.8  --  29.8  MCHC 34.2  --  33.4  RDW 16.9*  --  16.9*  LYMPHSABS 1.8  --   --   MONOABS 0.6  --   --   EOSABS 0.2  --   --   BASOSABS 0.0  --   --     Chemistries   Recent Labs Lab 08/09/14 2036 08/09/14 2203 08/10/14 0400 08/11/14 0710  NA 136 137  --  142  K 3.9 4.1  --  4.0  CL 100 98  --  109  CO2 23  --   --  28  GLUCOSE 330* 362*  --  161*  BUN 38* 41*  --  22  CREATININE 1.51* 1.50* 1.50* 1.00  CALCIUM 9.0  --   --  9.0  AST 44*  --   --   --   ALT 22  --   --   --   ALKPHOS 83  --   --   --   BILITOT 1.1  --   --   --    ------------------------------------------------------------------------------------------------------------------ estimated creatinine clearance is 91.3 mL/min (by C-G formula based on Cr of 1). ------------------------------------------------------------------------------------------------------------------  Recent Labs  08/10/14 0400 08/11/14 1030  HGBA1C 8.3* 8.3*   ------------------------------------------------------------------------------------------------------------------  Recent Labs  08/10/14 0400  CHOL 128  HDL 21*  LDLCALC 59  TRIG 161*  CHOLHDL 6.1   ------------------------------------------------------------------------------------------------------------------ No results for input(s): TSH, T4TOTAL, T3FREE, THYROIDAB in the last 72 hours.  Invalid input(s): FREET3 ------------------------------------------------------------------------------------------------------------------ No results for input(s): VITAMINB12, FOLATE, FERRITIN, TIBC, IRON, RETICCTPCT in the last 72 hours.  Coagulation profile  Recent Labs Lab 08/09/14 2036  INR 1.03    No results for input(s): DDIMER in the last 72 hours.  Cardiac Enzymes  Recent Labs Lab 08/10/14 0400 08/10/14 0737 08/10/14 1355    TROPONINI 0.04* 0.04* 0.03   ------------------------------------------------------------------------------------------------------------------ Invalid input(s): POCBNP     Time Spent in minutes  35   SINGH,PRASHANT K M.D on 08/12/2014 at 9:09 AM  Between 7am to 7pm - Pager - [phone]  After 7pm go to [email or web address] - password [facility]  [facility] Office  [phone]

## 2014-08-12 NOTE — Clinical Social Work Note (Signed)
Clinical Social Worker attempted to meet with pt at bedside. Pt is currently having procedure. CSW will meet with pt once he returns to unit.   Derenda FennelBashira Maximo Spratling, MSW, LCSWA [phone] 08/12/2014 11:09 AM

## 2014-08-12 NOTE — Evaluation (Signed)
Occupational Therapy Evaluation Patient Details Name: Clayton [name] MRN: [id number] DOB: 02-Mar-1956 Today's Date: 08/12/2014    History of Present Illness Patient is a 59 yo male admitted 08/09/14 with slurred speech, weakness. MRI reveals advanced chronic ischemic changes affecting the pons and remainder the brainstem. 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 Chronic small vessel disease. No acute or subacute infarction.  Patient had 2 falls prior to admission while attempting transfers.  Patient with renal insufficiency.  PMH:  Mult CVA's, CAD, MI, DM, neuropathy, HTN, HLD, Lt BKA 2013, Rt transmetatarsal amputation 2008.   Clinical Impression   Pt admitted with above. He demonstrates the below listed deficits and will benefit from continued OT to maximize safety and independence with BADLs.  He presents to OT with generalized weakness.  Currently, he requires min - mod A with BADLs.  Recommend SNF level rehab prior to return to his independent living apt.       Follow Up Recommendations  SNF;Supervision/Assistance - 24 hour    Equipment Recommendations  None recommended by OT    Recommendations for Other Services       Precautions / Restrictions Precautions Precautions: Fall Precaution Comments: Falls pta. Required Braces or Orthoses: Other Brace/Splint Other Brace/Splint: LLE prosthesis      Mobility Bed Mobility Overal bed mobility: Needs Assistance Bed Mobility: Sit to Supine       Sit to supine: Min assist   General bed mobility comments: verbal cues and assist to scoot hips across bed   Transfers Overall transfer level: Needs assistance   Transfers: Sit to/from Stand;Stand Pivot Transfers Sit to Stand: Mod assist Stand pivot transfers: Min assist            Balance Overall balance assessment: Needs assistance Sitting-balance support: Feet supported Sitting balance-Leahy Scale: Good     Standing balance support: During functional  activity;Single extremity supported Standing balance-Leahy Scale: Poor                              ADL Overall ADL's : Needs assistance/impaired Eating/Feeding: Modified independent;Sitting   Grooming: Wash/dry hands;Wash/dry face;Oral care;Brushing hair;Set up;Sitting   Upper Body Bathing: Set up;Sitting   Lower Body Bathing: Moderate assistance;Sit to/from stand;Sitting/lateral leans   Upper Body Dressing : Minimal assistance;Sitting   Lower Body Dressing: Moderate assistance;Sit to/from stand;Sitting/lateral leans Lower Body Dressing Details (indicate cue type and reason): Pt with difficulty donning/doffing prosthesis and shoes  Toilet Transfer: Moderate assistance;Stand-pivot;Grab bars;BSC;Comfort height toilet Toilet Transfer Details (indicate cue type and reason): reliant on grab bars.  Mod A to move sit to stand  Toileting- Clothing Manipulation and Hygiene: Maximal assistance;Sit to/from stand       Functional mobility during ADLs: Moderate assistance;Rolling walker General ADL Comments: Pt had finished with PT prior to OT eval and was fatigued      Vision                 Additional Comments: Pt has been able to read and manipulate remote control without difficulty    Perception Perception Perception Tested?: Yes   Praxis Praxis Praxis tested?: Within functional limits    Pertinent Vitals/Pain       Hand Dominance Right   Extremity/Trunk Assessment Upper Extremity Assessment Upper Extremity Assessment: Generalized weakness   Lower Extremity Assessment Lower Extremity Assessment: Defer to PT evaluation       Communication Communication Communication: Expressive difficulties (dysarthria)   Cognition Arousal/Alertness:  Awake/alert Behavior During Therapy: WFL for tasks assessed/performed Overall Cognitive Status: Within Functional Limits for tasks assessed                     General Comments       Exercises        Shoulder Instructions      Home Living Family/patient expects to be discharged to:: Private residence Living Arrangements: Alone Available Help at Discharge: Family;Available PRN/intermittently Type of Home: Apartment [facility]per patient)) Home Access: Level entry     Home Layout: One level     Bathroom Shower/Tub: Producer, television/film/video: Handicapped height Bathroom Accessibility: Yes How Accessible: Accessible via walker;Accessible via wheelchair Home Equipment: Dan Humphreys - 2 wheels;Shower seat;Bedside commode;Electric scooter;Wheelchair - manual;Grab bars - toilet;Grab bars - tub/shower;Hospital bed   Additional Comments: Pt lives in retirement community in an apt [employer] [place])`      Prior Functioning/Environment Level of Independence: Needs assistance  Gait / Transfers Assistance Needed: Patient reports he transfers into/out of scooter/wheelchair on his own.  Not ambulatory.  Uses scooter/wheelchair for mobility. ADL's / Homemaking Assistance Needed: Independent with bathing.  Facility provides meals in dining room, and housekeeping weekly. Communication / Swallowing Assistance Needed: Slightly slurred speed from prior CVA      OT Diagnosis: Generalized weakness   OT Problem List: Decreased strength;Decreased activity tolerance;Impaired balance (sitting and/or standing);Obesity   OT Treatment/Interventions: Self-care/ADL training;Therapeutic exercise;DME and/or AE instruction;Therapeutic activities;Patient/family education;Balance training    OT Goals(Current goals can be found in the care plan section) Acute Rehab OT Goals Patient Stated Goal: to regain strength  OT Goal Formulation: With patient Time For Goal Achievement: 08/26/14 Potential to Achieve Goals: Good ADL Goals Pt Will Perform Lower Body Bathing: with min guard assist;sit to/from stand Pt Will Perform Lower Body Dressing: with min guard assist;sit to/from stand Pt Will  Transfer to Toilet: with min guard assist;stand pivot transfer;grab bars;bedside commode Pt Will Perform Toileting - Clothing Manipulation and hygiene: with min guard assist;sit to/from stand  OT Frequency: Min 2X/week   Barriers to D/C: Decreased caregiver support          Co-evaluation              End of Session Equipment Utilized During Treatment: Engineer, water Communication: Mobility status  Activity Tolerance: Patient tolerated treatment well Patient left: in bed;with call bell/phone within reach;with bed alarm set   Time: 1433-1501 OT Time Calculation (min): 28 min Charges:  OT General Charges $OT Visit: 1 Procedure OT Evaluation $Initial OT Evaluation Tier I: 1 Procedure OT Treatments $Self Care/Home Management : 8-22 mins G-Codes:    Shiya Fogelman M 24-Aug-2014, 3:16 PM

## 2014-08-13 DIAGNOSIS — I25119 Atherosclerotic heart disease of native coronary artery with unspecified angina pectoris: Secondary | ICD-10-CM | POA: Diagnosis not present

## 2014-08-13 DIAGNOSIS — N289 Disorder of kidney and ureter, unspecified: Secondary | ICD-10-CM | POA: Diagnosis not present

## 2014-08-13 DIAGNOSIS — Z8673 Personal history of transient ischemic attack (TIA), and cerebral infarction without residual deficits: Secondary | ICD-10-CM | POA: Diagnosis not present

## 2014-08-13 DIAGNOSIS — R4781 Slurred speech: Secondary | ICD-10-CM | POA: Diagnosis not present

## 2014-08-13 LAB — URINE CULTURE

## 2014-08-13 LAB — GLUCOSE, CAPILLARY
GLUCOSE-CAPILLARY: 309 mg/dL — AB (ref 70–99)
Glucose-Capillary: 133 mg/dL — ABNORMAL HIGH (ref 70–99)
Glucose-Capillary: 296 mg/dL — ABNORMAL HIGH (ref 70–99)
Glucose-Capillary: 190 mg/dL — ABNORMAL HIGH (ref 70–99)

## 2014-08-13 NOTE — Progress Notes (Signed)
Speech Language Pathology Treatment: Dysphagia  Patient Details Name: Clayton [name] MRN: [id number] DOB: 07-Jul-1956 Today's Date: 08/13/2014 Time: 1220-1230 SLP Time Calculation (min) (ACUTE ONLY): 10 min  Assessment / Plan / Recommendation Clinical Impression  Pt with improving swallow and following precautions independently.  Pt with good understanding of deficits and compensatory strategies. Recommend continued dys 3, thins upon D/C due to absent dentition.  No further SLP f/u is warranted.  SLP to sign off.    HPI HPI: 59 yo man with PMH of stroke, CAD, DM with neuropathy, s/p Left BKA, HTN, GERD, HLD, h/o MI who presents with slurred speech and weakness. Mr. Lenn has a long history of neurological symptoms. He had a stroke in 2006 and then in 2007. Since that time he has had residual weakness on the left and speech issues. Jan29 noticed that his speech became more garbled and he also began to feel more unsteady, with worsening weakness next 24 hours. MRI reveals advanced chronic ischemic changes affecting the pons and remainder the brainstem. 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 Chronic small vessel disease. No acute or subacute infarction.     Pertinent Vitals Pain Assessment: No/denies pain  SLP Plan  All goals met    Recommendations Diet recommendations: Dysphagia 3 (mechanical soft);Thin liquid Liquids provided via: Cup Medication Administration: Whole meds with liquid Compensations: Slow rate;Small sips/bites;Clear throat intermittently Postural Changes and/or Swallow Maneuvers: Seated upright 90 degrees              Oral Care Recommendations: Oral care BID Follow up Recommendations: None Plan: All goals met    GO Functional Assessment Tool Used: clinical judgement Functional Limitations: Swallowing Swallow Current Status (F0277): At least 20 percent but less than 40 percent impaired, limited or restricted Swallow Goal Status [phone]): At least 1  percent but less than 20 percent impaired, limited or restricted Swallow Discharge Status [phone]): At least 1 percent but less than 20 percent impaired, limited or restricted   Clayton [name] 08/13/2014, 12:30 PM

## 2014-08-13 NOTE — Progress Notes (Signed)
Inpatient Diabetes Program Recommendations  AACE/ADA: New Consensus Statement on Inpatient Glycemic Control (2013)  Target Ranges:  Prepandial:   less than 140 mg/dL      Peak postprandial:   less than 180 mg/dL (1-2 hours)      Critically ill patients:  140 - 180 mg/dL     Results for Clayton [name], Clayton [name] (MRN [id number]) as of 08/13/2014 09:42  Ref. Range 08/12/2014 07:54 08/12/2014 12:06 08/12/2014 16:20 08/12/2014 20:24  Glucose-Capillary Latest Range: 70-99 mg/dL 409130 (H) 811202 (H) 914180 (H) 286 (H)     Outpatient Diabetes medications:  Lantus 45 units bid Metformin 1000 mg bid Novolog 15 units breakfast, 20 units lunch, 15 units supper   Current orders:  Lantus 30 units daily  Novolog moderate SSI Q4 hours    MD- Please consider the following:  1. Change Novolog Moderate SSI to tid ac + HS (currently ordered as Q4 hours and patient eating PO diet) 2. Add Novolog Meal Coverage- Novolog 5 units tid with meals (~1/3 total home dose of Novolog)    Will follow Ambrose FinlandJeannine Johnston Clayton Pavao RN, MSN, CDE Diabetes Coordinator Inpatient Diabetes Program Team Pager: [phone] (8a-10p)

## 2014-08-13 NOTE — Progress Notes (Signed)
Patient ID: Clayton [name], male   DOB: 1956-07-04, 59 y.o.   MRN: [id number] Dr. Estanislado Pandy met again with pt today to discuss whether or not he would like to proceed with angioplasty of the rt ICA intrastent stenosis. He has agreed to undergo intervention. We will contact anesthesia to see when they can place on schedule and update pt as soon as possible. TRH aware. Pt should remain on aspirin and plavix.

## 2014-08-13 NOTE — Progress Notes (Signed)
CCMD notified RN of asystole on tele box. RN check on patient, patient was sitting up eating comfortably. No signs of distress. Will continue to monitor.

## 2014-08-13 NOTE — Progress Notes (Signed)
Occupational Therapy Evaluation Addendum:    08/12/14 1500  OT G-codes **NOT FOR INPATIENT CLASS**  Functional Limitation Self care  Self Care Current Status ([id number](G8987) CK  Self Care Goal Status (U0454(G8988) CI  Jeani HawkingWendi Virginie Josten, OTR/L [phone]

## 2014-08-13 NOTE — Progress Notes (Signed)
PROGRESS NOTE  Clayton [name] ZOX:[id number] DOB: [birth year] DOA: 08/09/2014 PCP: No primary care provider on file.  HPI: Clayton [name] is a 59 year old Caucasian male with history of stroke, CAD, diabetic neuropathy, diabetes in poor control, left BKA, right transmetatarsal amputation, hypertension, dyslipidemia who presented to the hospital with chief complaints of slurred speech and generalized weakness. CT and MRI brain were nonacute. He had evidence of mild troponin rise due to acute renal failure. Renal failure has resolved, he had no chest symptoms.   He did have evidence of severe intracranial carotid artery stenosis for which he is being seen by interventional radiology and underwent angiogram on 08/11/2014, he is to be denuded on aspirin and Plavix and statin for now, interventional radiology is planning stent placement possibly later this admission. He did not have acute stroke.  Subjective / 24 H Interval events This morning, his speech is still a little slurred but he feels this is his baseline from previous strokes.  Assessment/Plan: Principal Problem:   Slurred speech Active Problems:   Renal insufficiency   H/O: stroke   CAD (coronary artery disease)   DM type 2 with diabetic peripheral neuropathy   HLD (hyperlipidemia)   GERD (gastroesophageal reflux disease)   Slurred speech with some generalized nonfocal weakness - Question TIA versus unmasking of old CVA symptoms, MRI brain nonacute, multiple old strokes noted. MRA brain suggests severe bilateral carotid artery disease.  - 2D echocardiogram showed: LV EF 55-60% with hypokinesis at apex. Features are consistent with a pseudonormal left ventricular filling pattern, with concomitant abnormal relaxation and increased filling pressure (grade 2 diastolic dysfunction). No evidence of thrombus. Ventricular septal motion showed paradox. Left atrium moderately dilated.  A1c: 8.3 - LDL is under 70 and he is on aspirin and  statin; continue.  Bilateral carotid artery disease  - MRA showed: "Severe extracranial stenotic disease of the right internal carotid artery. 50% stenosis of the proximal right carotid canal. Very severe stenosis at the proximal right siphon with luminal diameter 1 mm or less. Severe stenosis and irregularity throughout the right carotid siphon beyond that. Moderate to severe atherosclerotic disease in the left carotid siphon. Pronounced irregularity of the vessel, but with maximal stenosis estimated at 50-70%. 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 - No correctable proximal MCA stenosis. - Distal vessel atherosclerotic narrowing and irregularity, particularly evident in the posterior circulation branches." - Carotid duplex: "Right: moderate to severe calcfic plaque origin and proximal ICA. 40-59% ICA stenosis, highest end of scale. Left: Severe calcific plaque origin and proximal ICA. 80-99% ICA stenosis, highest end of scale." - Continue aspirin and plavix - IR has evaluated and plans intervention, to discuss with anesthesia tomorrow  CAD with Borderline Troponins - Pain and symptom free, EKG non acute, on ASA and statin. - Troponin trend is non-ACS pattern. Likely minimal elevation due to renal insufficiency. - See above for echo results, EF 55-60%. - Continue Coreg.  - Will need outpatient cardiology follow-up.  Renal insufficiency - Creatinine 1.51 on 08/09/14; down to 1.00 on 08/11/14. Resolved with hydration.  DM type 2 - Continue Lantus injections and novoLOG sliding scale.  Diabetic Neuropathy - Continue Neurontin.  Dyslipidemia - Continue atorvastatin.   Diet: DIET DYS 3 Fluids: 0.9% NaCl IV DVT Prophylaxis: Heparin  Code Status: Full Code Family Communication: None Disposition Plan: Inpatient  Consultants:  Vascular surgery  Interventional radiology  Procedures:  CT  head, MRI/MRA brain, 2D echocardiogram, Carotid doppler  Interventional radiology pending   Antibiotics  Anti-infectives    None       Studies  Dg Swallowing Func-speech Pathology  08/12/2014   Carolan Shiver, CCC-SLP     08/12/2014 11:30 AM  Objective Swallowing Evaluation: Modified Barium Swallowing Study   Patient Details  Name: Clayton [name] MRN: [id number] Date of Birth: 22-Sep-1955  Today's Date: 08/12/2014 Time: SLP Start Time (ACUTE ONLY): 1030-SLP Stop Time (ACUTE  ONLY): 1100 SLP Time Calculation (min) (ACUTE ONLY): 30 min  Past Medical History:  Past Medical History  Diagnosis Date  . Stroke   . Coronary artery disease   . Diabetes mellitus without complication   . Hypertension   . MI (myocardial infarction)   . Hyperlipidemia   . GERD (gastroesophageal reflux disease)   . Diabetic neuropathy   . Diabetic retinopathy    Past Surgical History:  Past Surgical History  Procedure Laterality Date  . Below knee leg amputation Left   . Appendectomy    . Percutaneous placement intravascular stent cervical carotid  artery Right    HPI:  HPI: 59 yo man with PMH of stroke, CAD, DM with neuropathy, s/p  Left BKA, HTN, GERD, HLD, h/o MI who presents with slurred speech  and weakness. Clayton [name] has a long history of neurological  symptoms. He had a stroke in 2006 and then in 2007. Since that  time he has had residual weakness on the left and speech issues.  Jan29 noticed that his speech became more garbled and he also  began to feel more unsteady, with worsening weakness next 24  hours. MRI reveals advanced chronic ischemic changes affecting  the pons and remainder the brainstem. Old deep brain infarctions  including the left basal ganglia. Chronic small vessel disease.  No acute or subacute infarction.    No Data Recorded  Assessment / Plan / Recommendation CHL IP CLINICAL IMPRESSIONS 08/12/2014  Dysphagia Diagnosis Mild pharyngeal phase dysphagia  Clinical impression Pt presents with a likely  acute-on-chronic  mild dysphagia marked by reduced anterior/superior movement of  the hyolaryngeal complex.  This deficit leads to high penetration  of thin and nectar-thick liquids, and trace aspiration of thin  liquids when consumed in large, consecutive boluses.  When pt  limited sips of thin to singular, smaller boluses, aspiration did  not occur.  Pt viewed the MBS in real time - we discussed the  necessity of monitoring bolus quantity and rate of consumption.   Pt verbalized understanding.  Recommend dysphagia 3, thin  liquids; continue meds whole in puree.  SLP to follow briefly for  use of precautions as instructed.        CHL IP TREATMENT RECOMMENDATION 08/12/2014  Treatment Plan Recommendations Therapy as outlined in treatment  plan below     CHL IP DIET RECOMMENDATION 08/12/2014  Diet Recommendations Dysphagia 3 (Mechanical Soft);Thin liquid  Liquid Administration via Cup  Medication Administration Whole meds with puree  Compensations Slow rate;Small sips/bites;Clear throat  intermittently  Postural Changes and/or Swallow Maneuvers Seated upright 90  degrees     CHL IP OTHER RECOMMENDATIONS 08/12/2014  Recommended Consults (None)  Oral Care Recommendations Oral care BID  Other Recommendations (None)     CHL IP FOLLOW UP RECOMMENDATIONS 08/12/2014  Follow up Recommendations None     CHL IP FREQUENCY AND DURATION 08/10/2014  Speech Therapy Frequency (ACUTE ONLY) min 2x/week Treatment  Duration 2 weeks  SLP Swallow Goals No flowsheet data found.  No flowsheet data found.    CHL IP REASON FOR REFERRAL 08/12/2014  Reason for Referral Objectively evaluate swallowing function     CHL IP ORAL PHASE 08/12/2014  Lips (None)  Tongue (None)  Mucous membranes (None)  Nutritional status (None)  Other (None)  Oxygen therapy (None)  Oral Phase WFL  Oral - Pudding Teaspoon (None)  Oral - Pudding Cup (None)  Oral - Honey Teaspoon (None)  Oral - Honey Cup (None)  Oral - Honey Syringe (None)  Oral - Nectar Teaspoon (None)  Oral -  Nectar Cup (None)  Oral - Nectar Straw (None)  Oral - Nectar Syringe (None) Oral - Ice Chips (None)  Oral - Thin Teaspoon (None)  Oral - Thin Cup (None)  Oral - Thin Straw (None)  Oral - Thin Syringe (None)  Oral - Puree (None)  Oral - Mechanical Soft (None)  Oral - Regular (None)  Oral - Multi-consistency (None)  Oral - Pill (None)  Oral Phase - Comment (None)      CHL IP PHARYNGEAL PHASE 08/12/2014  Pharyngeal Phase Impaired  Pharyngeal - Pudding Teaspoon (None)  Penetration/Aspiration details (pudding teaspoon) (None)  Pharyngeal - Pudding Cup (None)  Penetration/Aspiration details (pudding cup) (None)  Pharyngeal - Honey Teaspoon (None)  Penetration/Aspiration details (honey teaspoon) (None)  Pharyngeal - Honey Cup (None)  Penetration/Aspiration details (honey cup) (None)  Pharyngeal - Honey Syringe (None)  Penetration/Aspiration details (honey syringe) (None)  Pharyngeal - Nectar Teaspoon (None)  Penetration/Aspiration details (nectar teaspoon) (None)  Pharyngeal - Nectar Cup Reduced anterior laryngeal  mobility;Reduced laryngeal elevation;Penetration/Aspiration  during swallow  Penetration/Aspiration details (nectar cup) Material enters  airway, remains ABOVE vocal cords then ejected out  Pharyngeal - Nectar Straw (None)  Penetration/Aspiration details (nectar straw) (None)  Pharyngeal - Nectar Syringe (None)  Penetration/Aspiration details (nectar syringe) (None)  Pharyngeal - Ice Chips (None)  Penetration/Aspiration details (ice chips) (None)  Pharyngeal - Thin Teaspoon (None)  Penetration/Aspiration details (thin teaspoon) (None)  Pharyngeal - Thin Cup Reduced anterior laryngeal mobility;Reduced  laryngeal elevation;Penetration/Aspiration during swallow  Penetration/Aspiration details (thin cup) Material enters airway,  passes BELOW cords and not ejected out despite cough attempt by  patient;Material enters airway, passes BELOW cords without  attempt by patient to eject out (silent aspiration)  Pharyngeal -  Thin Straw (None)  Penetration/Aspiration details (thin straw) (None)  Pharyngeal - Thin Syringe (None)  Penetration/Aspiration details (thin syringe') (None)  Pharyngeal - Puree Reduced anterior laryngeal mobility;Reduced  laryngeal elevation;Pharyngeal residue - valleculae  Penetration/Aspiration details (puree) (None)  Pharyngeal - Mechanical Soft Reduced anterior laryngeal  mobility;Reduced laryngeal elevation;Pharyngeal residue -  valleculae  Penetration/Aspiration details (mechanical soft) (None)  Pharyngeal - Regular (None)  Penetration/Aspiration details (regular) (None)  Pharyngeal - Multi-consistency (None)  Penetration/Aspiration details (multi-consistency) (None)  Pharyngeal - Pill (None)  Penetration/Aspiration details (pill) (None)  Pharyngeal Comment (None)     No flowsheet data found.  CHL IP GO 08/12/2014  Functional Assessment Tool Used clinical judgement  Functional Limitations Swallowing  Swallow Current Status [phone]) CJ  Swallow Goal Status (Y7829) CI  Swallow Discharge Status (F6213) (None)  Motor Speech Current Status [phone]) (None)  Motor Speech Goal Status (Q4696) (None)  Motor Speech Goal Status (E9528) (None)  Spoken Language Comprehension Current Status (U1324) (None)  Spoken Language Comprehension Goal Status (M0102) (None)  Spoken Language Comprehension Discharge Status [phone] (None)  Spoken Language Expression Current Status (U4403) (None)  Spoken Language Expression Goal Status (K7425) (None)  Spoken  Language Expression Discharge Status [phone](G9164) (None)  Attention Current Status [phone](G9165) (None)  Attention Goal Status (U9811(G9166) (None)  Attention Discharge Status [phone](G9167) (None)  Memory Current Status (G9562(G9168) (None)  Memory Goal Status (Z3086(G9169) (None)  Memory Discharge Status (V7846(G9170) (None) Voice Current Status  (N6295(G9171) (None)  Voice Goal Status (M8413(G9172) (None)  Voice Discharge Status (K4401(G9173) (None)  Other Speech-Language Pathology Functional Limitation [phone](G9174)  (None)  Other Speech-Language  Pathology Functional Limitation Goal Status  (D6644(G9175) (None)  Other Speech-Language Pathology Functional Limitation Discharge  Status [phone](G9176) (None)          Amanda L. Samson Fredericouture, [place] CCC/SLP Pager [phone]  Blenda MountsCouture, Amanda Laurice 08/12/2014, 11:29 AM    Ir Angio Intra Extracran Sel Com Carotid Innominate Bilat Mod Sed  08/12/2014   CLINICAL DATA:  Generalized weakness. Previous history of right internal carotid artery symptomatic stenosis now requiring stent assisted angioplasty. Rescue reangioplasty for intrastent stenosis. Recent abnormal MRA of the brain.  EXAM: BILATERAL COMMON CAROTID AND INNOMINATE ANGIOGRAPHY AND BILATERAL VERTEBRAL ARTERY ANGIOGRAMS  PROCEDURE: Contrast:  65ML OMNIPAQUE IOHEXOL 300 MG/ML  SOLN  Anesthesia/Sedation:  Conscious sedation.  Medications: Versed 1 mg IV.  Fentanyl 25 mcg IV.  Following a full explanation of the procedure along with the potential associated complications, an informed witnessed consent was obtained.  The right groin was prepped and draped in the usual sterile fashion. Thereafter using modified Seldinger technique, transfemoral access into the right common femoral artery was obtained without difficulty. Over a 0.035 inch guidewire, a 5 French Pinnacle sheath was inserted. Through this, and also over 0.035 inch guidewire, a 5 French JB1 catheter was advanced to the aortic arch region and selectively positioned in the right common carotid artery, the right vertebral artery, the left common carotid artery and the left vertebral artery and left subclavian artery.  There were no acute complications. The patient tolerated the procedure well.  FINDINGS: The left common carotid arteriogram demonstrates the left external carotid artery and its major branches to be widely patent.  The left internal carotid artery at the bulb demonstrates approximately 55% narrowing secondary to a circumferential plaque.  Distal to this the vessel is seen to opacify to the cranial skull base.  The  petrous segment is normal.  The petrous cavernous junction demonstrates approximately 75% narrowing secondary to a circumferential plaque.  Distal to this the cavernous and the supraclinoid segments are widely patent.  The left middle and the left anterior cerebral arteries opacify normally into the capillary and the venous phases. Cross opacification via the anterior communicating artery of the right anterior cerebral artery A2 segment is seen.  The right common carotid arteriogram demonstrates the right external carotid artery and its major branches to be widely patent.  The left internal carotid artery at the bulb and just distal to the bulb demonstrates mild narrowing of approximately 20% by NASCET criteria.  Distal to this the right internal carotid artery is seen to opacify normally to the cranial skull base.  The petrous cavernous junction at the site of the previously stented point again demonstrates a severe focal stenosis within the stent of approximately 80%. Distal to this the vessel resumes normal caliber.  Distal to the distal portion of the stent, there is a focal stenoses of the supraclinoid right ICA of approximately 70%.  More distally the right middle cerebral artery is seen to opacify into the capillary and the venous phases with delayed hemodynamic flow through the vessel. There is only a miniscule opacification of the  hypoplastic right anterior cerebral artery A1 segment.  The right vertebral artery origin is normal.  The vessel opacifies normally to the cranial skull base. There is no visualization of the right posterior inferior cerebellar artery.  The basilar artery, the posterior cerebral arteries, the superior cerebellar arteries opacify normally into the capillary and the venous phases.  A right anterior inferior cerebellar artery opacification is noted with a focal stenoses at the origin of the vessel.  There is no opacification of the left anterior inferior cerebellar artery.  The right  posterior cerebral artery demonstrates a 50% narrowing at the distal P1 segment.  The left vertebral artery origin is from the aortic arch but with the origins of the left common carotid artery and left subclavian arteries.  Normal opacification of the vertebrobasilar junction is seen.  Opacification of the left posterior inferior cerebellar artery reveals focal areas of caliber irregularity with a focal area of severe stenosis in the proximal 1/3 of the left posterior-inferior cerebellar artery. The left vertebrobasilar junction demonstrates moderate stenosis just proximal to the basilar artery. Opacification is seen of the posterior cerebral arteries, superior cerebellar arteries and the right anterior inferior cerebellar artery. Again there is no visualization of the left anterior inferior cerebellar artery. The right anterior inferior cerebellar artery appears to supply the right posterior-inferior cerebellar artery territory.  Inflow of non-opacified blood is seen in the basilar artery from the contralateral vertebral artery.  The left subclavian artery injection demonstrates normal opacification of the left subclavian artery and the left thyrocervical trunk.  IMPRESSION: Approximately 75% narrowing of the left internal carotid artery petrous cavernous junction. This finding is new when compared to the previous angiogram.  Approximately 55% narrowing of the left internal carotid artery at the bulb by NASCET criteria. This has progressed compared to the previous arteriogram.  Approximately 80% intra stent stenoses at the right internal carotid artery petrous cavernous junction, with a 70% stenoses which is new of the supraclinoid right ICA.  Nonvisualization of the right posterior inferior cerebellar artery, and of the left anterior inferior cerebellar artery.  Approximately 50% narrowing of the right posterior cerebral artery distal P1 segment.   Electronically Signed   By: Julieanne Cotton M.D.   On:  08/11/2014 15:57   Ir Angio Vertebral Sel Vertebral Bilat Mod Sed  08/12/2014   CLINICAL DATA:  Generalized weakness. Previous history of right internal carotid artery symptomatic stenosis now requiring stent assisted angioplasty. Rescue reangioplasty for intrastent stenosis. Recent abnormal MRA of the brain.  EXAM: BILATERAL COMMON CAROTID AND INNOMINATE ANGIOGRAPHY AND BILATERAL VERTEBRAL ARTERY ANGIOGRAMS  PROCEDURE: Contrast:  OMNIPAQUE IOHEXOL 300 MG/ML  SOLN  Anesthesia/Sedation:  Conscious sedation.  Medications: Versed 1 mg IV.  Fentanyl 25 mcg IV.  Following a full explanation of the procedure along with the potential associated complications, an informed witnessed consent was obtained.  The right groin was prepped and draped in the usual sterile fashion. Thereafter using modified Seldinger technique, transfemoral access into the right common femoral artery was obtained without difficulty. Over a 0.035 inch guidewire, a 5 French Pinnacle sheath was inserted. Through this, and also over 0.035 inch guidewire, a 5 French JB1 catheter was advanced to the aortic arch region and selectively positioned in the right common carotid artery, the right vertebral artery, the left common carotid artery and the left vertebral artery and left subclavian artery.  There were no acute complications. The patient tolerated the procedure well.  FINDINGS: The left common carotid arteriogram demonstrates the  left external carotid artery and its major branches to be widely patent.  The left internal carotid artery at the bulb demonstrates approximately 55% narrowing secondary to a circumferential plaque.  Distal to this the vessel is seen to opacify to the cranial skull base.  The petrous segment is normal.  The petrous cavernous junction demonstrates approximately 75% narrowing secondary to a circumferential plaque.  Distal to this the cavernous and the supraclinoid segments are widely patent.  The left middle and the left  anterior cerebral arteries opacify normally into the capillary and the venous phases. Cross opacification via the anterior communicating artery of the right anterior cerebral artery A2 segment is seen.  The right common carotid arteriogram demonstrates the right external carotid artery and its major branches to be widely patent.  The left internal carotid artery at the bulb and just distal to the bulb demonstrates mild narrowing of approximately 20% by NASCET criteria.  Distal to this the right internal carotid artery is seen to opacify normally to the cranial skull base.  The petrous cavernous junction at the site of the previously stented point again demonstrates a severe focal stenosis within the stent of approximately 80%. Distal to this the vessel resumes normal caliber.  Distal to the distal portion of the stent, there is a focal stenoses of the supraclinoid right ICA of approximately 70%.  More distally the right middle cerebral artery is seen to opacify into the capillary and the venous phases with delayed hemodynamic flow through the vessel. There is only a miniscule opacification of the hypoplastic right anterior cerebral artery A1 segment.  The right vertebral artery origin is normal.  The vessel opacifies normally to the cranial skull base. There is no visualization of the right posterior inferior cerebellar artery.  The basilar artery, the posterior cerebral arteries, the superior cerebellar arteries opacify normally into the capillary and the venous phases.  A right anterior inferior cerebellar artery opacification is noted with a focal stenoses at the origin of the vessel.  There is no opacification of the left anterior inferior cerebellar artery.  The right posterior cerebral artery demonstrates a 50% narrowing at the distal P1 segment.  The left vertebral artery origin is from the aortic arch but with the origins of the left common carotid artery and left subclavian arteries.  Normal opacification  of the vertebrobasilar junction is seen.  Opacification of the left posterior inferior cerebellar artery reveals focal areas of caliber irregularity with a focal area of severe stenosis in the proximal 1/3 of the left posterior-inferior cerebellar artery. The left vertebrobasilar junction demonstrates moderate stenosis just proximal to the basilar artery. Opacification is seen of the posterior cerebral arteries, superior cerebellar arteries and the right anterior inferior cerebellar artery. Again there is no visualization of the left anterior inferior cerebellar artery. The right anterior inferior cerebellar artery appears to supply the right posterior-inferior cerebellar artery territory.  Inflow of non-opacified blood is seen in the basilar artery from the contralateral vertebral artery.  The left subclavian artery injection demonstrates normal opacification of the left subclavian artery and the left thyrocervical trunk.  IMPRESSION: Approximately 75% narrowing of the left internal carotid artery petrous cavernous junction. This finding is new when compared to the previous angiogram.  Approximately 55% narrowing of the left internal carotid artery at the bulb by NASCET criteria. This has progressed compared to the previous arteriogram.  Approximately 80% intra stent stenoses at the right internal carotid artery petrous cavernous junction, with a 70% stenoses which is  new of the supraclinoid right ICA.  Nonvisualization of the right posterior inferior cerebellar artery, and of the left anterior inferior cerebellar artery.  Approximately 50% narrowing of the right posterior cerebral artery distal P1 segment.   Electronically Signed   By: Julieanne Cotton M.D.   On: 08/11/2014 15:57    Objective  Filed Vitals:   08/13/14 0100 08/13/14 0500 08/13/14 0904 08/13/14 1326  BP: 130/71 117/73 133/66 121/65  Pulse: 71 71 74 59  Temp: 97.8 F (36.6 C) 97.7 F (36.5 C) 97.9 F (36.6 C) 98.6 F (37 C)  TempSrc:  Oral Oral Oral Oral  Resp: 18 16 16 16   Height:      Weight:      SpO2: 98% 97% 97% 97%    Intake/Output Summary (Last 24 hours) at 08/13/14 1433 Last data filed at 08/13/14 1006  Gross per 24 hour  Intake      0 ml  Output    350 ml  Net   -350 ml   [facility] Weights   08/09/14 2038 08/10/14 0148  Weight: 106.595 kg (235 lb) 103 kg (227 lb 1.2 oz)   Exam:  General:  Overweight, male, lying in bed, in NAD.  HEENT: Anicteric sclera, no eye/ear/nose drainage.  Cardiovascular: RRR no m/r/g, no carotid bruits noted d/t facial hair.  Respiratory: CTAB without wheezes/rales/rhonchi.  Abdomen: Soft, non-tender, non-distended.  MSK/Extremities: No edema. Amputations at both lower extremities.  Skin: Warm and dry.  Data Reviewed: Basic Metabolic Panel:  Recent Labs Lab 08/09/14 2036 08/09/14 2203 08/10/14 0400 08/11/14 0710  NA 136 137  --  142  K 3.9 4.1  --  4.0  CL 100 98  --  109  CO2 23  --   --  28  GLUCOSE 330* 362*  --  161*  BUN 38* 41*  --  22  CREATININE 1.51* 1.50* 1.50* 1.00  CALCIUM 9.0  --   --  9.0   Liver Function Tests:  Recent Labs Lab 08/09/14 2036  AST 44*  ALT 22  ALKPHOS 83  BILITOT 1.1  PROT 7.3  ALBUMIN 3.7   CBC:  Recent Labs Lab 08/09/14 2036 08/09/14 2203 08/10/14 0400  WBC 9.8  --  7.3  NEUTROABS 7.2  --   --   HGB 12.5* 12.6* 11.2*  HCT 36.6* 37.0* 33.5*  MCV 87.4  --  89.1  PLT 193  --  153   Cardiac Enzymes:  Recent Labs Lab 08/09/14 2036 08/10/14 0400 08/10/14 0737 08/10/14 1355  TROPONINI 0.04* 0.04* 0.04* 0.03   CBG:  Recent Labs Lab 08/12/14 1206 08/12/14 1620 08/12/14 2024 08/13/14 0503 08/13/14 1050  GLUCAP 202* 180* 286* 133* 296*    Recent Results (from the past 240 hour(s))  Urine culture     Status: None (Preliminary result)   Collection Time: 08/10/14  2:12 PM  Result Value Ref Range Status   Specimen Description URINE, RANDOM  Final   Special Requests NONE  Final   Colony Count    Final    20,OOO COLONIES/ML Performed at [facility]    Culture   Final    GRAM NEGATIVE RODS Performed at [facility]    Report Status PENDING  Incomplete     Scheduled Meds: . aspirin  325 mg Oral Daily  . atorvastatin  40 mg Oral Daily  . carvedilol  3.125 mg Oral BID WC  . cholecalciferol  2,000 Units Oral BID  . clopidogrel  75 mg Oral Daily  . gabapentin  600 mg Oral QID  . heparin  5,000 Units Subcutaneous 3 times per day  . insulin aspart  0-15 Units Subcutaneous 6 times per day  . insulin glargine  30 Units Subcutaneous Daily   Continuous Infusions: . sodium chloride      Jamison Neighbor, PA-S  Time spent: 35 minutes, more than 50% involved in counseling / care coordination / discussions with care team members and patient  Pamella Pert, MD [facility] Pager [phone]. If 7 PM - 7 AM, please contact night-coverage at [email or web address], password [facility] 08/13/2014, 2:33 PM  LOS: 4 days

## 2014-08-13 NOTE — Progress Notes (Signed)
Pt sitting up in bed with no noted distress. CCMD has notified this nurse of pt tele box asystole. Pt assessed, pt repositions self while in bed causing leads to be removed. Pt agreed to allow this nurse to remove hair from chest so that leads could stay on better. Will continue to monitor.

## 2014-08-14 DIAGNOSIS — G629 Polyneuropathy, unspecified: Secondary | ICD-10-CM | POA: Diagnosis not present

## 2014-08-14 DIAGNOSIS — N289 Disorder of kidney and ureter, unspecified: Secondary | ICD-10-CM | POA: Diagnosis not present

## 2014-08-14 DIAGNOSIS — Z8673 Personal history of transient ischemic attack (TIA), and cerebral infarction without residual deficits: Secondary | ICD-10-CM

## 2014-08-14 DIAGNOSIS — R4781 Slurred speech: Secondary | ICD-10-CM

## 2014-08-14 DIAGNOSIS — E785 Hyperlipidemia, unspecified: Secondary | ICD-10-CM | POA: Diagnosis not present

## 2014-08-14 DIAGNOSIS — E1142 Type 2 diabetes mellitus with diabetic polyneuropathy: Secondary | ICD-10-CM | POA: Diagnosis not present

## 2014-08-14 LAB — CBC
HEMATOCRIT: 33.7 % — AB (ref 39.0–52.0)
Hemoglobin: 11.5 g/dL — ABNORMAL LOW (ref 13.0–17.0)
MCH: 29.6 pg (ref 26.0–34.0)
MCHC: 34.1 g/dL (ref 30.0–36.0)
MCV: 86.6 fL (ref 78.0–100.0)
Platelets: 132 10*3/uL — ABNORMAL LOW (ref 150–400)
RBC: 3.89 MIL/uL — AB (ref 4.22–5.81)
RDW: 16.2 % — ABNORMAL HIGH (ref 11.5–15.5)
WBC: 6 10*3/uL (ref 4.0–10.5)

## 2014-08-14 LAB — BASIC METABOLIC PANEL
Anion gap: 8 (ref 5–15)
BUN: 7 mg/dL (ref 6–23)
CHLORIDE: 107 mmol/L (ref 96–112)
CO2: 28 mmol/L (ref 19–32)
Calcium: 9.1 mg/dL (ref 8.4–10.5)
Creatinine, Ser: 0.94 mg/dL (ref 0.50–1.35)
GFR calc Af Amer: >90 mL/min (ref >90)
GFR calc non Af Amer: 89 mL/min — ABNORMAL LOW (ref >90)
Glucose, Bld: 158 mg/dL — ABNORMAL HIGH (ref 70–99)
Potassium: 3.6 mmol/L (ref 3.5–5.1)
Sodium: 143 mmol/L (ref 135–145)

## 2014-08-14 LAB — GLUCOSE, CAPILLARY
GLUCOSE-CAPILLARY: 188 mg/dL — AB (ref 70–99)
Glucose-Capillary: 193 mg/dL — ABNORMAL HIGH (ref 70–99)
Glucose-Capillary: 195 mg/dL — ABNORMAL HIGH (ref 70–99)
Glucose-Capillary: 196 mg/dL — ABNORMAL HIGH (ref 70–99)
Glucose-Capillary: 198 mg/dL — ABNORMAL HIGH (ref 70–99)
Glucose-Capillary: 249 mg/dL — ABNORMAL HIGH (ref 70–99)

## 2014-08-14 NOTE — Progress Notes (Signed)
Occupational Therapy Treatment Patient Details Name: Clayton [name] MRN: [id number] DOB: [birth year] Today's Date: 08/14/2014    History of present illness Patient is a 59 yo male admitted 08/09/14 with slurred speech, weakness. MRI reveals advanced chronic ischemic changes affecting the pons and remainder the brainstem. 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 Chronic small vessel disease. No acute or subacute infarction.  Patient had 2 falls prior to admission while attempting transfers.  Patient with renal insufficiency.  PMH:  Mult CVA's, CAD, MI, DM, neuropathy, HTN, HLD, Lt BKA 2013, Rt transmetatarsal amputation 2008.   OT comments  Pt progressing toward OT goals.  He is now able to perform functional transfers with min A.  Requires set up assist for UB ADLs, and mod A for LB.  Will continue to follow.   Follow Up Recommendations  SNF;Supervision/Assistance - 24 hour    Equipment Recommendations  None recommended by OT    Recommendations for Other Services      Precautions / Restrictions Precautions Precautions: Fall Precaution Comments: Falls pta. Required Braces or Orthoses: Other Brace/Splint Other Brace/Splint: LLE prosthesis Restrictions Weight Bearing Restrictions: No       Mobility Bed Mobility Overal bed mobility: Needs Assistance Bed Mobility: Supine to Sit     Supine to sit: Min guard;HOB elevated     General bed mobility comments: requires increased time and use of bedrails   Transfers Overall transfer level: Needs assistance Equipment used: 1 person hand held assist Transfers: Sit to/from [employer] Sit to Stand: Min assist Stand pivot transfers: Min assist       General transfer comment: Min A to steady     Balance Overall balance assessment: Needs assistance Sitting-balance support: Single extremity supported;Feet supported Sitting balance-Leahy Scale: Good     Standing balance support: During functional  activity Standing balance-Leahy Scale: Poor                     ADL Overall ADL's : Needs assistance/impaired         Upper Body Bathing: Set up;Sitting   Lower Body Bathing: Moderate assistance;Sit to/from stand;Sitting/lateral leans Lower Body Bathing Details (indicate cue type and reason): Pt requires assist for peri area (uses LH sponge at home) Upper Body Dressing : Set up;Sitting   Lower Body Dressing: Moderate assistance;Sit to/from stand Lower Body Dressing Details (indicate cue type and reason): Pt with difficulty donning prosthesis due to set up being different than at home.  Toilet Transfer: Minimal assistance;Stand-pivot;BSC   Toileting- Clothing Manipulation and Hygiene: Maximal assistance;Sit to/from stand       Functional mobility during ADLs: Minimal assistance General ADL Comments: Pt spilled urinal in bed.  Transferred to recliner where pt proceeded to sponge bathe       Vision                     Perception     Praxis      Cognition   Behavior During Therapy: [facility] for tasks assessed/performed Overall Cognitive Status: Within Functional Limits for tasks assessed                       Extremity/Trunk Assessment               Exercises     Shoulder Instructions       General Comments      Pertinent Vitals/ Pain       Pain Assessment: No/denies  pain  Home Living                                          Prior Functioning/Environment              Frequency Min 2X/week     Progress Toward Goals  OT Goals(current goals can now be found in the care plan section)  Progress towards OT goals: Progressing toward goals  ADL Goals Pt Will Perform Lower Body Bathing: with min guard assist;sit to/from stand Pt Will Perform Lower Body Dressing: with min guard assist;sit to/from stand Pt Will Transfer to Toilet: with min guard assist;stand pivot transfer;grab bars;bedside commode Pt Will Perform  Toileting - Clothing Manipulation and hygiene: with min guard assist;sit to/from stand  Plan Discharge plan remains appropriate    Co-evaluation                 End of Session Equipment Utilized During Treatment: Other (comment) (prosthesis)   Activity Tolerance Patient tolerated treatment well   Patient Left in chair;with call bell/phone within reach;with chair alarm set   Nurse Communication Mobility status        Time: [id number] OT Time Calculation (min): 43 min  Charges: OT General Charges $OT Visit: 1 Procedure OT Treatments $Self Care/Home Management : 38-52 mins  [name] M 08/14/2014, 11:14 AM

## 2014-08-14 NOTE — Progress Notes (Signed)
Physical Therapy Treatment Patient Details Name: Clayton [name] MRN: [id number] DOB: 25-Aug-1955 Today's Date: 08/14/2014    History of Present Illness Patient is a 59 yo male admitted 08/09/14 with slurred speech, weakness. MRI reveals advanced chronic ischemic changes affecting the pons and remainder the brainstem. 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 Chronic small vessel disease. No acute or subacute infarction.  Patient had 2 falls prior to admission while attempting transfers.  Patient with renal insufficiency.  PMH:  Mult CVA's, CAD, MI, DM, neuropathy, HTN, HLD, Lt BKA 2013, Rt transmetatarsal amputation 2008.    PT Comments    Pt progressing well. Remains unsteady on his feet (in part may be due to higher bed than his home bed and recliner is shorter than his electric w/c). Overall good progress with goals updated.  Follow Up Recommendations  SNF;Supervision/Assistance - 24 hour     Equipment Recommendations  None recommended by PT    Recommendations for Other Services       Precautions / Restrictions Precautions Precautions: Fall Precaution Comments: Falls pta. Required Braces or Orthoses: Other Brace/Splint Other Brace/Splint: LLE prosthesis Restrictions Weight Bearing Restrictions: No    Mobility  Bed Mobility Overal bed mobility: Needs Assistance Bed Mobility: Supine to Sit;Sit to Supine     Supine to sit: Supervision;HOB elevated Sit to supine: Supervision   General bed mobility comments: Pt reports he has an electric hospital bed at home; used [facility] elevated and rail to simulate his own bed  Transfers Overall transfer level: Needs assistance Equipment used: 1 person hand held assist (bed rail or armrest) Transfers: Sit to/from [employer] Sit to Stand: Min guard Stand pivot transfers: Min assist       General transfer comment: Pt able to sit EOB and don his Lt BKA prosthesis and Rt shoe himself (close guarding due to  height of bed and slick linens); placed recliner in position to simulate how pt transfers to his electric w/c at home. Repeated transfers x 5 for strengthening  Ambulation/Gait             General Gait Details: pt reports at baseline he does not walk; uses power chair   Stairs            Wheelchair Mobility    Modified Rankin (Stroke Patients Only) Modified Rankin (Stroke Patients Only) Pre-Morbid Rankin Score: Moderate disability Modified Rankin: Moderately severe disability     Balance Overall balance assessment: Needs assistance   Sitting balance-Leahy Scale: Good     Standing balance support: Bilateral upper extremity supported;During functional activity Standing balance-Leahy Scale: Poor Standing balance comment: at his baseline requires at least unilateral UE support                    Cognition Arousal/Alertness: Awake/alert Behavior During Therapy: WFL for tasks assessed/performed Overall Cognitive Status: Within Functional Limits for tasks assessed                      Exercises      General Comments General comments (skin integrity, edema, etc.): Discussed wear time of prosthesis to simulate his home schedule and not allow his stump to "get soft"      Pertinent Vitals/Pain Pain Assessment: No/denies pain    Home Living                      Prior Function  PT Goals (current goals can now be found in the care plan section) Acute Rehab PT Goals Patient Stated Goal: regain strength PT Goal Formulation: With patient Time For Goal Achievement: 08/24/14 Potential to Achieve Goals: Good Progress towards PT goals: Goals met and updated - see care plan    Frequency  Min 3X/week    PT Plan Current plan remains appropriate    Co-evaluation             End of Session   Activity Tolerance: Patient tolerated treatment well Patient left: with call bell/phone within reach;in bed;with bed alarm set      Time: 9407-6808 PT Time Calculation (min) (ACUTE ONLY): 45 min  Charges:  $Therapeutic Activity: 38-52 mins                    G Codes:  Functional Limitation:  (Transfers)   Dontel Harshberger 08/14/2014, 4:22 PM Pager [phone]

## 2014-08-14 NOTE — Progress Notes (Signed)
[facility] PROGRESS NOTE  Clayton [name]:[id number] DOB: 1956/05/07 DOA: 08/09/2014 PCP: No primary care provider on file.  Principal Problem:  Slurred speech Active Problems:  Renal insufficiency  H/O: stroke  CAD (coronary artery disease)  DM type 2 with diabetic peripheral neuropathy  HLD (hyperlipidemia)  GERD (gastroesophageal reflux disease)   Assessment/Plan: 59 y/o male with PMH of HTN, HPL, CVA, s/p Intracranial stent, CAD, DM, neuropathy, diabetes in poor control, left BKA, right transmetatarsal amputation, who presented to the hospital with chief complaints of slurred speech and generalized weakness.  -CT and MRI brain: severe intracranial atherosclerosis, stent stenosis for which he is being seen by interventional radiology and underwent angiogram on 08/11/2014; planning stent placement. He did not have acute stroke.  1. Slurred speech with some generalized nonfocal weakness - Question TIA versus unmasking of old CVA symptoms, MRI brain nonacute, multiple old strokes noted. MRA brain suggests severe bilateral carotid artery disease.  - 2D echocardiogram showed: LV EF 55-60% with hypokinesis at apex. Features are consistent with a pseudonormal left ventricular filling pattern, with concomitant abnormal relaxation and increased filling pressure (grade 2 diastolic dysfunction). No evidence of thrombus. Ventricular septal motion showed paradox. Left atrium moderately dilated.  A1c: 8.3 - LDL is under 70 and he is on aspirin and statin; symptoms are resolving;   2. Severe intra/extracranial atherosclerosis; s/p MRA, angiogram  -no surgical intervention indicated per vascular surgery evaluation; will continue aspirin and plavix -h/o intracranial stent with now stenosis for which he is being seen by interventional radiology and underwent angiogram on 08/11/2014; planning stent placement.   3. CAD with Borderline Troponins; denies pains and symptom; troponin trend is  non-ACS pattern. Likely minimal elevation due to renal insufficiency. - echo: EF 55-60%; will cont Coreg, ASA and statin. cardiology follow up as outpatient  4. Renal insufficiency; Creatinine 1.51 on 08/09/14; down to 1.00 on 08/11/14. Resolved with hydration. Hold lasix 5. DM type 2; HA1c-8.3- Continue Lantus injections and novoLOG sliding scale.; d/c metformin due to renal insuffiencey   Code Status: full Family Communication: d/w patient, no family at the bedside (indicate person spoken with, relationship, and if by phone, the number) Disposition Plan: pend IR eval/procedure   Consultants:  Vascular surgery  Interventional radiology  Procedures:  CT head, MRI/MRA brain, 2D echocardiogram, Carotid doppler  Interventional radiology pending HPI/Subjective: Alert, oriented; denies chest pains   Objective: Filed Vitals:   08/14/14 0950  BP: 125/65  Pulse: 73  Temp: 98 F (36.7 C)  Resp: 20    Intake/Output Summary (Last 24 hours) at 08/14/14 1312 Last data filed at 08/14/14 1050  Gross per 24 hour  Intake      0 ml  Output   2500 ml  Net  -2500 ml   [facility] Weights   08/09/14 2038 08/10/14 0148  Weight: 106.595 kg (235 lb) 103 kg (227 lb 1.2 oz)    Exam:   General:  Alert, oriented   Cardiovascular: s1,s2 rrr  Respiratory: CTA BL  Abdomen: soft, NT, ND  Musculoskeletal: s/p BKA L   Data Reviewed: Basic Metabolic Panel:  Recent Labs Lab 08/09/14 2036 08/09/14 2203 08/10/14 0400 08/11/14 0710 08/14/14 0610  NA 136 137  --  142 143  K 3.9 4.1  --  4.0 3.6  CL 100 98  --  109 107  CO2 23  --   --  28 28  GLUCOSE 330* 362*  --  161* 158*  BUN 38* 41*  --  22 7  CREATININE 1.51* 1.50* 1.50* 1.00 0.94  CALCIUM 9.0  --   --  9.0 9.1   Liver Function Tests:  Recent Labs Lab 08/09/14 2036  AST 44*  ALT 22  ALKPHOS 83  BILITOT 1.1  PROT 7.3  ALBUMIN 3.7   No results for input(s): LIPASE, AMYLASE in the last 168 hours. No results for input(s):  AMMONIA in the last 168 hours. CBC:  Recent Labs Lab 08/09/14 2036 08/09/14 2203 08/10/14 0400 08/14/14 0610  WBC 9.8  --  7.3 6.0  NEUTROABS 7.2  --   --   --   HGB 12.5* 12.6* 11.2* 11.5*  HCT 36.6* 37.0* 33.5* 33.7*  MCV 87.4  --  89.1 86.6  PLT 193  --  153 132*   Cardiac Enzymes:  Recent Labs Lab 08/09/14 2036 08/10/14 0400 08/10/14 0737 08/10/14 1355  TROPONINI 0.04* 0.04* 0.04* 0.03   BNP (last 3 results) No results for input(s): BNP in the last 8760 hours.  ProBNP (last 3 results) No results for input(s): PROBNP in the last 8760 hours.  CBG:  Recent Labs Lab 08/13/14 2026 08/14/14 0030 08/14/14 0442 08/14/14 0803 08/14/14 1140  GLUCAP 309* 196* 193* 195* 188*    Recent Results (from the past 240 hour(s))  Urine culture     Status: None   Collection Time: 08/10/14  2:12 PM  Result Value Ref Range Status   Specimen Description URINE, RANDOM  Final   Special Requests NONE  Final   Colony Count   Final    20,OOO COLONIES/ML Performed at [facility]    Culture   Final    PROTEUS MIRABILIS Note: Confirmed Extended Spectrum Beta-Lactamase Producer (ESBL) CRITICAL RESULT CALLED TO, READ BACK BY AND VERIFIED WITH: MONICA SMITH AT 10:17 P.M. ON 08/13/2014 WARRB Performed at [facility]    Report Status 08/13/2014 FINAL  Final   Organism ID, Bacteria PROTEUS MIRABILIS  Final      Susceptibility   Proteus mirabilis - MIC*    AMPICILLIN >=32 RESISTANT Resistant     CEFAZOLIN >=64 RESISTANT Resistant     CEFTRIAXONE >=64 RESISTANT Resistant     CIPROFLOXACIN >=4 RESISTANT Resistant     GENTAMICIN <=1 SENSITIVE Sensitive     LEVOFLOXACIN >=8 RESISTANT Resistant     NITROFURANTOIN 128 RESISTANT Resistant     TOBRAMYCIN <=1 SENSITIVE Sensitive     TRIMETH/SULFA >=320 RESISTANT Resistant     IMIPENEM 2 SENSITIVE Sensitive     PIP/TAZO <=4 SENSITIVE Sensitive     * PROTEUS MIRABILIS     Studies: No results found.  Scheduled Meds: .  aspirin  325 mg Oral Daily  . atorvastatin  40 mg Oral Daily  . carvedilol  3.125 mg Oral BID WC  . cholecalciferol  2,000 Units Oral BID  . clopidogrel  75 mg Oral Daily  . gabapentin  600 mg Oral QID  . heparin  5,000 Units Subcutaneous 3 times per day  . insulin aspart  0-15 Units Subcutaneous 6 times per day  . insulin glargine  30 Units Subcutaneous Daily   Continuous Infusions: . sodium chloride      Principal Problem:   Slurred speech Active Problems:   Renal insufficiency   H/O: stroke   CAD (coronary artery disease)   DM type 2 with diabetic peripheral neuropathy   HLD (hyperlipidemia)   GERD (gastroesophageal reflux disease)    Time spent: >35 minutes     Orah Sonnen N  [facility] Pager [phone]. If 7PM-7AM, please contact night-coverage at [email or web address], password [email or web address] 08/14/2014, 1:12 PM  LOS: 5 days

## 2014-08-14 NOTE — Clinical Social Work Note (Signed)
Clinical Social Worker continues to follow pt for continued support and medical readiness. Per MD patient will possibly discharge Saturday, 2/6. CSW updated admissions coordinator, Merial Rob[place] of [place]. [place] has  received insurance authorization as well.   CSW to follow. FL-2 on chart for MD signature.   Derenda FennelBashira Jennae Hakeem, MSW, LCSWA [phone] 08/14/2014 11:21 AM

## 2014-08-14 NOTE — Progress Notes (Signed)
Patient ID: Clayton [name], male   DOB: 1956/01/26, 59 y.o.   MRN: [id number]   Pt is scheduled for Neuro Interventional procedure 08/19/14  (Tuesday) with Dr Corliss Skainseveshwar R ICA angioplasty/poss stent placement with anesthesia  Cont ASA/Plavix daily.  IR PA will prepare pt; orders, etc... night before procedure

## 2014-08-14 NOTE — Progress Notes (Signed)
UR completed 

## 2014-08-14 NOTE — Progress Notes (Signed)
08/14/14  Results for [name], Nareg K (MRN [id number]) as of 08/14/2014 11:18  Ref. Range 08/14/2014 00:30 08/14/2014 04:42 08/14/2014 08:03  Glucose-Capillary Latest Range: 70-99 mg/dL 782196 (H) 956193 (H) 213195 (H)   Current diet: Dys 3, PO not documented.  When pt is eating greater than 50%, please change correction coverage to Novolog tidac and hs.  Also, consider adding CHO mod to current diet.  Mellissa KohutSherrie Kobyn Kray RD, CDE. M.Ed. Pager [phone] Inpatient Diabetes Coordinator

## 2014-08-14 NOTE — Progress Notes (Signed)
CCMD notified this nurse that pt of asystole tele box. Pt noted sitting up in bed with no distress. Tele lead was off and replaced. Pt states that his leads come off during repositioning. Pt repositions self while in bed. Will continue to monitor.

## 2014-08-15 ENCOUNTER — Other Ambulatory Visit: Payer: Self-pay | Admitting: Radiology

## 2014-08-15 DIAGNOSIS — N289 Disorder of kidney and ureter, unspecified: Secondary | ICD-10-CM | POA: Diagnosis not present

## 2014-08-15 DIAGNOSIS — E785 Hyperlipidemia, unspecified: Secondary | ICD-10-CM

## 2014-08-15 DIAGNOSIS — R4781 Slurred speech: Secondary | ICD-10-CM | POA: Diagnosis not present

## 2014-08-15 DIAGNOSIS — E1142 Type 2 diabetes mellitus with diabetic polyneuropathy: Secondary | ICD-10-CM | POA: Diagnosis not present

## 2014-08-15 LAB — GLUCOSE, CAPILLARY
GLUCOSE-CAPILLARY: 187 mg/dL — AB (ref 70–99)
Glucose-Capillary: 151 mg/dL — ABNORMAL HIGH (ref 70–99)
Glucose-Capillary: 132 mg/dL — ABNORMAL HIGH (ref 70–99)

## 2014-08-15 MED ORDER — FUROSEMIDE 40 MG PO TABS: 40.0000 mg | ORAL_TABLET | Freq: Every day | ORAL | Status: DC | PRN

## 2014-08-15 MED ORDER — ASPIRIN 325 MG PO TABS: 325.0000 mg | ORAL_TABLET | Freq: Every day | ORAL | Status: DC

## 2014-08-15 MED ORDER — INSULIN ASPART 100 UNIT/ML ~~LOC~~ SOLN
0.0000 [IU] | Freq: Three times a day (TID) | SUBCUTANEOUS | Status: DC
Start: 2014-08-15 — End: 2015-06-13

## 2014-08-15 MED ORDER — HYDROCODONE-ACETAMINOPHEN 5-325 MG PO TABS: 1.0000 | ORAL_TABLET | Freq: Three times a day (TID) | ORAL | Status: DC | PRN

## 2014-08-15 MED ORDER — INSULIN ASPART 100 UNIT/ML ~~LOC~~ SOLN
0.0000 [IU] | Freq: Three times a day (TID) | SUBCUTANEOUS | Status: DC
Start: 2014-08-15 — End: 2014-08-15

## 2014-08-15 MED ORDER — CLOPIDOGREL BISULFATE 75 MG PO TABS: 75.0000 mg | ORAL_TABLET | Freq: Every day | ORAL | Status: DC

## 2014-08-15 NOTE — Progress Notes (Signed)
Writer called IR to know what time patient is supposed to come back for stent placement on Tuesday, was told at 6am. PK at the SNF, called notified about the time and that patient is supposed to be NPO after midnight on Monday.

## 2014-08-15 NOTE — Discharge Instructions (Signed)
You are scheduled [facility] -please nothing per mouth on 08/19/14 after midnight except medications

## 2014-08-15 NOTE — Progress Notes (Signed)
Patient being transferred to SNF. Report called to the receiving nurse. [facility]

## 2014-08-15 NOTE — Discharge Summary (Signed)
Physician Discharge Summary  DIMITRIS SHANAHAN AVW:[id number] DOB: [birth year] DOA: 08/09/2014  PCP: No primary care provider on file.  Admit date: 08/09/2014 Discharge date: 08/15/2014  Time spent: >35 minutes  Recommendations for Outpatient Follow-up:   Scheduled for IR procedure on 08/19/14 at 6.30 AM  -please keep NPO after midnight  F/u with cardiology in 2 week   Discharge Diagnoses:  Principal Problem:   Slurred speech Active Problems:   Renal insufficiency   H/O: stroke   CAD (coronary artery disease)   DM type 2 with diabetic peripheral neuropathy   HLD (hyperlipidemia)   GERD (gastroesophageal reflux disease)   Discharge Condition: stable   Diet recommendation: DM, low sodium   Filed Weights   08/09/14 2038 08/10/14 0148  Weight: 106.595 kg (235 lb) 103 kg (227 lb 1.2 oz)    History of present illness:  59 y/o male with PMH of HTN, HPL, CVA, s/p Intracranial stent, CAD, DM, neuropathy, diabetes in poor control, left BKA, right transmetatarsal amputation, who presented to the hospital with chief complaints of slurred speech and generalized weakness.  -CT and MRI brain: severe intracranial atherosclerosis, stent stenosis for which he is being seen by interventional radiology and underwent angiogram on 08/11/2014; planning stent placement. He did not have acute stroke.   Hospital Course:  1. Slurred speech with some generalized nonfocal weakness - Question TIA versus unmasking of old CVA symptoms, MRI brain nonacute, multiple old strokes noted. MRA brain suggests severe bilateral carotid artery disease.  - 2D echocardiogram showed: LV EF 55-60% with hypokinesis at apex. Features are consistent with a pseudonormal left ventricular filling pattern, with concomitant abnormal relaxation and increased filling pressure (grade 2 diastolic dysfunction). No evidence of thrombus. Ventricular septal motion showed paradox. Left atrium moderately dilated.  A1c: 8.3 - LDL is under 70 and  he is on aspirin and statin; symptoms are resolving;   2. Severe intra/extracranial atherosclerosis; s/p MRA, angiogram  -no surgical intervention indicated per vascular surgery evaluation; will continue aspirin and plavix -h/o intracranial stent with now stenosis for which he is being seen by interventional radiology and underwent angiogram on 08/11/2014; planning stent placement on 08/19/14 scheduled by IR at 6.30 AM at [facility] [place]  -NPO after midnight except meds;   3. CAD with Borderline Troponins; denies pains and symptom; troponin trend is non-ACS pattern. Likely minimal elevation due to renal insufficiency. - echo: EF 55-60%; will cont Coreg, ASA and statin. cardiology follow up as outpatient  4. Renal insufficiency; Creatinine 1.51 on 08/09/14; down to 1.00 on 08/11/14. Resolved with hydration. Hold lasix, use only prn for now; pend IR procedure 5. DM type 2; HA1c-8.3- Continue Lantus injections and novoLOG sliding scale.; d/c metformin due to renal insuffiencey   Patient is being d/c to SNF, planned IR procedure on 08/19/14  Procedures:  none (i.e. Studies not automatically included, echos, thoracentesis, etc; not x-rays)  Consultations:  IR, vascular surgery   Discharge Exam: Filed Vitals:   08/15/14 0609  BP: 130/67  Pulse: 72  Temp: 97.7 F (36.5 C)  Resp: 18    General: alert Cardiovascular: s1,s2 rrr Respiratory: CTA BL  Discharge Instructions  Discharge Instructions    Diet - low sodium heart healthy    Complete by:  As directed      Discharge instructions    Complete by:  As directed   You are scheduled for interventional radiology procedure on 08/19/14 at [facility] [place] at 6.30 AM     Increase activity  slowly    Complete by:  As directed             Medication List    STOP taking these medications        aspirin 81 MG chewable tablet  Replaced by:  aspirin 325 MG tablet     metFORMIN 1000 MG tablet  Commonly known as:  GLUCOPHAGE      oxyCODONE 15 MG immediate release tablet  Commonly known as:  ROXICODONE      TAKE these medications        aspirin 325 MG tablet  Take 1 tablet (325 mg total) by mouth daily.     atorvastatin 40 MG tablet  Commonly known as:  LIPITOR  Take 40 mg by mouth daily.     cholecalciferol 1000 UNITS tablet  Commonly known as:  VITAMIN D  Take 2,000 Units by mouth 2 (two) times daily.     clopidogrel 75 MG tablet  Commonly known as:  PLAVIX  Take 1 tablet (75 mg total) by mouth daily.     furosemide 40 MG tablet  Commonly known as:  LASIX  Take 1 tablet (40 mg total) by mouth daily as needed for fluid or edema.     gabapentin 300 MG capsule  Commonly known as:  NEURONTIN  Take 300 mg by mouth 4 (four) times daily.     HYDROcodone-acetaminophen 5-325 MG per tablet  Commonly known as:  NORCO/VICODIN  Take 1 tablet by mouth 3 (three) times daily as needed for moderate pain.     insulin aspart 100 UNIT/ML injection  Commonly known as:  novoLOG  Inject 0-15 Units into the skin 3 (three) times daily with meals.     insulin glargine 100 UNIT/ML injection  Commonly known as:  LANTUS  Inject 45 Units into the skin 2 (two) times daily.     lisinopril 2.5 MG tablet  Commonly known as:  PRINIVIL,ZESTRIL  Take 2.5 mg by mouth daily.     metoprolol 50 MG tablet  Commonly known as:  LOPRESSOR  Take 25 mg by mouth 2 (two) times daily.     multivitamin with minerals Tabs tablet  Take 1 tablet by mouth daily.       Allergies  Allergen Reactions  . Niaspan [Niacin Er] Other (See Comments)    Face was red and hot       Follow-up Information    Follow up with [facility] In 2 weeks.   Contact information:   [address] [place] [address] [phone]      Follow up with Oneal Grout, MD On 08/19/2014.   Specialty:  Interventional Radiology   Why:  6.30 AM   Contact information:   [address] [place]  [address] [phone]        The results of significant diagnostics from this hospitalization (including imaging, microbiology, ancillary and laboratory) are listed below for reference.    Significant Diagnostic Studies: Dg Chest 2 View  08/09/2014   CLINICAL DATA:  Unable to walk since yesterday. Weakness and unable to stand. Speech difficulty yesterday that better now. Face is non today. History of prior stroke, MI, CABG, coronary artery disease, diabetes.  EXAM: CHEST  2 VIEW  COMPARISON:  03/18/2008  FINDINGS: Postoperative changes in the mediastinum. Normal heart size and pulmonary vascularity. No focal airspace disease or consolidation in the lungs. No blunting of costophrenic angles. No pneumothorax. Mediastinal contours appear intact.  Degenerative changes in the spine and shoulders.  IMPRESSION: No active cardiopulmonary disease.   Electronically Signed   By: Burman Nieves M.D.   On: 08/09/2014 22:45   Ct Head Wo Contrast  08/09/2014   CLINICAL DATA:  Weakness and numbness  EXAM: CT HEAD WITHOUT CONTRAST  TECHNIQUE: Contiguous axial images were obtained from the base of the skull through the vertex without intravenous contrast.  COMPARISON:  06/14/2007  FINDINGS: There is no intracranial hemorrhage, mass or evidence of acute infarction. There is old infarction with encephalomalacia in the left centrum semiovale. There is old infarction in the pons, right greater than left. These are unchanged from 2008. Additional smaller old infarctions are present in the periventricular frontal white matter, right greater than left. There is mild generalized atrophy and mild chronic microvascular ischemic disease. There is no extra-axial fluid collection. Calvarium and skullbase are intact.  IMPRESSION: Mild atrophy and chronic microvascular ischemic disease. 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 No acute findings are evident.   Electronically Signed   By: Ellery Plunk M.D.   On: 08/09/2014  22:30   Mri Brain Without Contrast  08/10/2014   CLINICAL DATA:  Personal previous history of stroke and vascular disease. Acute presentation with slurred speech and weakness. Symptoms began last night.  EXAM: MRI HEAD WITHOUT CONTRAST  TECHNIQUE: Multiplanar, multiecho pulse sequences of the brain and surrounding structures were obtained without intravenous contrast.  COMPARISON:  Head CT 08/09/2014 in 06/14/2007.  MRI 01/17/2006.  FINDINGS: Diffusion imaging does not show any acute or subacute infarction. There is extensive old infarction throughout the pons bilaterally. There are old small vessel cerebellar infarctions in their cerebellar atrophy. There are old infarctions in the left basal ganglia and radiating white matter tracts and moderate chronic small-vessel ischemic changes throughout the cerebral hemispheric white matter. No cortical infarction is seen within the cerebral hemispheres. No neoplastic mass lesion, hemorrhage, hydrocephalus or extra-axial collection. No pituitary mass. No inflammatory sinus disease. No skull or skullbase lesion. Major vessels at the base of the brain show flow.  IMPRESSION: Advanced chronic ischemic changes affecting the pons and remainder the brainstem. Old deep brain infarctions including the left basal ganglia up. Chronic small vessel disease. No acute or subacute infarction or other reversible process.   Electronically Signed   By: Paulina Fusi M.D.   On: 08/10/2014 07:34   Dg Swallowing Func-speech Pathology  08/12/2014   Carolan Shiver, CCC-SLP     08/12/2014 11:30 AM  Objective Swallowing Evaluation: Modified Barium Swallowing Study   Patient Details  Name: Clayton [name] MRN: [id number] Date of Birth: 07/04/1956  Today's Date: 08/12/2014 Time: SLP Start Time (ACUTE ONLY): 1030-SLP Stop Time (ACUTE  ONLY): 1100 SLP Time Calculation (min) (ACUTE ONLY): 30 min  Past Medical History:  Past Medical History  Diagnosis Date  . Stroke   . Coronary artery disease   .  Diabetes mellitus without complication   . Hypertension   . MI (myocardial infarction)   . Hyperlipidemia   . GERD (gastroesophageal reflux disease)   . Diabetic neuropathy   . Diabetic retinopathy    Past Surgical History:  Past Surgical History  Procedure Laterality Date  . Below knee leg amputation Left   . Appendectomy    . Percutaneous placement intravascular stent cervical carotid  artery Right    HPI:  HPI: 59 yo man with PMH of stroke, CAD, DM with neuropathy, s/p  Left BKA, HTN, GERD, HLD, h/o MI who presents  with slurred speech  and weakness. Mr. Barren has a long history of neurological  symptoms. He had a stroke in 2006 and then in 2007. Since that  time he has had residual weakness on the left and speech issues.  Jan29 noticed that his speech became more garbled and he also  began to feel more unsteady, with worsening weakness next 24  hours. MRI reveals advanced chronic ischemic changes affecting  the pons and remainder the brainstem. Old deep brain infarctions  including the left basal ganglia. Chronic small vessel disease.  No acute or subacute infarction.    No Data Recorded  Assessment / Plan / Recommendation CHL IP CLINICAL IMPRESSIONS 08/12/2014  Dysphagia Diagnosis Mild pharyngeal phase dysphagia  Clinical impression Pt presents with a likely acute-on-chronic  mild dysphagia marked by reduced anterior/superior movement of  the hyolaryngeal complex.  This deficit leads to high penetration  of thin and nectar-thick liquids, and trace aspiration of thin  liquids when consumed in large, consecutive boluses.  When pt  limited sips of thin to singular, smaller boluses, aspiration did  not occur.  Pt viewed the MBS in real time - we discussed the  necessity of monitoring bolus quantity and rate of consumption.   Pt verbalized understanding.  Recommend dysphagia 3, thin  liquids; continue meds whole in puree.  SLP to follow briefly for  use of precautions as instructed.        CHL IP TREATMENT  RECOMMENDATION 08/12/2014  Treatment Plan Recommendations Therapy as outlined in treatment  plan below     CHL IP DIET RECOMMENDATION 08/12/2014  Diet Recommendations Dysphagia 3 (Mechanical Soft);Thin liquid  Liquid Administration via Cup  Medication Administration Whole meds with puree  Compensations Slow rate;Small sips/bites;Clear throat  intermittently  Postural Changes and/or Swallow Maneuvers Seated upright 90  degrees     CHL IP OTHER RECOMMENDATIONS 08/12/2014  Recommended Consults (None)  Oral Care Recommendations Oral care BID  Other Recommendations (None)     CHL IP FOLLOW UP RECOMMENDATIONS 08/12/2014  Follow up Recommendations None     CHL IP FREQUENCY AND DURATION 08/10/2014  Speech Therapy Frequency (ACUTE ONLY) min 2x/week Treatment  Duration 2 weeks         SLP Swallow Goals No flowsheet data found.  No flowsheet data found.    CHL IP REASON FOR REFERRAL 08/12/2014  Reason for Referral Objectively evaluate swallowing function     CHL IP ORAL PHASE 08/12/2014  Lips (None)  Tongue (None)  Mucous membranes (None)  Nutritional status (None)  Other (None)  Oxygen therapy (None)  Oral Phase WFL  Oral - Pudding Teaspoon (None)  Oral - Pudding Cup (None)  Oral - Honey Teaspoon (None)  Oral - Honey Cup (None)  Oral - Honey Syringe (None)  Oral - Nectar Teaspoon (None)  Oral - Nectar Cup (None)  Oral - Nectar Straw (None)  Oral - Nectar Syringe (None) Oral - Ice Chips (None)  Oral - Thin Teaspoon (None)  Oral - Thin Cup (None)  Oral - Thin Straw (None)  Oral - Thin Syringe (None)  Oral - Puree (None)  Oral - Mechanical Soft (None)  Oral - Regular (None)  Oral - Multi-consistency (None)  Oral - Pill (None)  Oral Phase - Comment (None)      CHL IP PHARYNGEAL PHASE 08/12/2014  Pharyngeal Phase Impaired  Pharyngeal - Pudding Teaspoon (None)  Penetration/Aspiration details (pudding teaspoon) (None)  Pharyngeal - Pudding Cup (None)  Penetration/Aspiration details (pudding cup) (None)  Pharyngeal - Honey Teaspoon (None)   Penetration/Aspiration details (honey teaspoon) (None)  Pharyngeal - Honey Cup (None)  Penetration/Aspiration details (honey cup) (None)  Pharyngeal - Honey Syringe (None)  Penetration/Aspiration details (honey syringe) (None)  Pharyngeal - Nectar Teaspoon (None)  Penetration/Aspiration details (nectar teaspoon) (None)  Pharyngeal - Nectar Cup Reduced anterior laryngeal  mobility;Reduced laryngeal elevation;Penetration/Aspiration  during swallow  Penetration/Aspiration details (nectar cup) Material enters  airway, remains ABOVE vocal cords then ejected out  Pharyngeal - Nectar Straw (None)  Penetration/Aspiration details (nectar straw) (None)  Pharyngeal - Nectar Syringe (None)  Penetration/Aspiration details (nectar syringe) (None)  Pharyngeal - Ice Chips (None)  Penetration/Aspiration details (ice chips) (None)  Pharyngeal - Thin Teaspoon (None)  Penetration/Aspiration details (thin teaspoon) (None)  Pharyngeal - Thin Cup Reduced anterior laryngeal mobility;Reduced  laryngeal elevation;Penetration/Aspiration during swallow  Penetration/Aspiration details (thin cup) Material enters airway,  passes BELOW cords and not ejected out despite cough attempt by  patient;Material enters airway, passes BELOW cords without  attempt by patient to eject out (silent aspiration)  Pharyngeal - Thin Straw (None)  Penetration/Aspiration details (thin straw) (None)  Pharyngeal - Thin Syringe (None)  Penetration/Aspiration details (thin syringe') (None)  Pharyngeal - Puree Reduced anterior laryngeal mobility;Reduced  laryngeal elevation;Pharyngeal residue - valleculae  Penetration/Aspiration details (puree) (None)  Pharyngeal - Mechanical Soft Reduced anterior laryngeal  mobility;Reduced laryngeal elevation;Pharyngeal residue -  valleculae  Penetration/Aspiration details (mechanical soft) (None)  Pharyngeal - Regular (None)  Penetration/Aspiration details (regular) (None)  Pharyngeal - Multi-consistency (None)  Penetration/Aspiration  details (multi-consistency) (None)  Pharyngeal - Pill (None)  Penetration/Aspiration details (pill) (None)  Pharyngeal Comment (None)     No flowsheet data found.  CHL IP GO 08/12/2014  Functional Assessment Tool Used clinical judgement  Functional Limitations Swallowing  Swallow Current Status [phone]) CJ  Swallow Goal Status (U0454) CI  Swallow Discharge Status (U9811) (None)  Motor Speech Current Status [phone]) (None)  Motor Speech Goal Status (G9562) (None)  Motor Speech Goal Status (Z3086) (None)  Spoken Language Comprehension Current Status (V7846) (None)  Spoken Language Comprehension Goal Status (N6295) (None)  Spoken Language Comprehension Discharge Status [phone]) (None)  Spoken Language Expression Current Status [phone]) (None)  Spoken Language Expression Goal Status [phone]) (None)  Spoken Language Expression Discharge Status [phone]) (None)  Attention Current Status (I3474) (None)  Attention Goal Status (Q5956) (None)  Attention Discharge Status [phone]) (None)  Memory Current Status (E3329) (None)  Memory Goal Status (J1884) (None)  Memory Discharge Status (Z6606) (None) Voice Current Status  (T0160) (None)  Voice Goal Status (F0932) (None)  Voice Discharge Status (T5573) (None)  Other Speech-Language Pathology Functional Limitation [phone])  (None)  Other Speech-Language Pathology Functional Limitation Goal Status  (K2706) (None)  Other Speech-Language Pathology Functional Limitation Discharge  Status [phone]) (None)          Amanda L. Samson [place], [place] CCC/SLP Pager [phone]  Blenda Mounts Laurice 08/12/2014, 11:29 AM    Mr Maxine Glenn Head/brain Wo Cm  08/10/2014   CLINICAL DATA:  Widespread cardiovascular disease. Slurred speech and weakness. Chronic ischemic changes of the brain particularly in the pons.  EXAM: MRA HEAD WITHOUT CONTRAST  TECHNIQUE: Angiographic images of the Circle of Willis were obtained using MRA technique without intravenous contrast.  COMPARISON:  MRI brain same day.  Angiography 06/14/2007.   FINDINGS: Severe stenosis of the right internal carotid artery at the skullbase is again demonstrated. At the proximal carotid canal, there is a 50% diameter stenosis. At the proximal siphon, the lumen is reduced to 1  mm or less, but antegrade flow does persist. Beyond that, there is pronounced atherosclerotic irregularity in the siphon region. At the distal siphon/ supra clinoid internal carotid artery, there is severe stenosis and irregularity with luminal diameter of 1 mm. No flow is seen in the right anterior cerebral artery. Right middle cerebral artery is patent without proximal stenosis.  The left internal carotid artery is widely patent through the carotid canal. There is advanced atherosclerotic disease in the siphon region with narrowing and irregularity particularly in the proximal siphon. Stenosis in that region is estimated at 50-70%. Pronounced irregularity is seen beyond that. Narrowing of the distal siphon is approximately 30%. The anterior and middle cerebral vessels are patent, with flow from this site to supplying both anterior cerebral arteries. There is no proximal left middle cerebral artery stenosis.  Both vertebral arteries are widely patent through the foramen magnum to the basilar. The right is larger than the left. No basilar stenosis. Flow is demonstrated in a left posterior inferior cerebellar artery, bilateral anterior inferior cerebellar arteries, bilateral superior cerebellar arteries, and both posterior cerebral arteries. Medium to small branch vessels show atherosclerotic narrowing and irregularity.  IMPRESSION: Severe extracranial stenotic disease of the right internal carotid artery. 50% stenosis of the proximal right carotid canal. Very severe stenosis at the proximal right siphon with luminal diameter 1 mm or less. Severe stenosis and irregularity throughout the right carotid siphon beyond that.  Moderate to severe atherosclerotic disease in the left carotid siphon. Pronounced  irregularity of the vessel, but with maximal stenosis estimated at 50-70%.  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  No correctable proximal MCA stenosis.  Distal vessel atherosclerotic narrowing and irregularity, particularly evident in the posterior circulation branches.   Electronically Signed   By: Paulina Fusi M.D.   On: 08/10/2014 09:31   Ir Angio Intra Extracran Sel Com Carotid Innominate Bilat Mod Sed  08/12/2014   CLINICAL DATA:  Generalized weakness. Previous history of right internal carotid artery symptomatic stenosis now requiring stent assisted angioplasty. Rescue reangioplasty for intrastent stenosis. Recent abnormal MRA of the brain.  EXAM: BILATERAL COMMON CAROTID AND INNOMINATE ANGIOGRAPHY AND BILATERAL VERTEBRAL ARTERY ANGIOGRAMS  PROCEDURE: Contrast:  OMNIPAQUE IOHEXOL 300 MG/ML  SOLN  Anesthesia/Sedation:  Conscious sedation.  Medications: Versed 1 mg IV.  Fentanyl 25 mcg IV.  Following a full explanation of the procedure along with the potential associated complications, an informed witnessed consent was obtained.  The right groin was prepped and draped in the usual sterile fashion. Thereafter using modified Seldinger technique, transfemoral access into the right common femoral artery was obtained without difficulty. Over a 0.035 inch guidewire, a 5 French Pinnacle sheath was inserted. Through this, and also over 0.035 inch guidewire, a 5 French JB1 catheter was advanced to the aortic arch region and selectively positioned in the right common carotid artery, the right vertebral artery, the left common carotid artery and the left vertebral artery and left subclavian artery.  There were no acute complications. The patient tolerated the procedure well.  FINDINGS: The left common carotid arteriogram demonstrates the left external carotid artery and its major branches to be widely patent.  The left  internal carotid artery at the bulb demonstrates approximately 55% narrowing secondary to a circumferential plaque.  Distal to this the vessel is seen to opacify to the cranial skull base.  The petrous segment is normal.  The petrous cavernous junction demonstrates approximately  75% narrowing secondary to a circumferential plaque.  Distal to this the cavernous and the supraclinoid segments are widely patent.  The left middle and the left anterior cerebral arteries opacify normally into the capillary and the venous phases. Cross opacification via the anterior communicating artery of the right anterior cerebral artery A2 segment is seen.  The right common carotid arteriogram demonstrates the right external carotid artery and its major branches to be widely patent.  The left internal carotid artery at the bulb and just distal to the bulb demonstrates mild narrowing of approximately 20% by NASCET criteria.  Distal to this the right internal carotid artery is seen to opacify normally to the cranial skull base.  The petrous cavernous junction at the site of the previously stented point again demonstrates a severe focal stenosis within the stent of approximately 80%. Distal to this the vessel resumes normal caliber.  Distal to the distal portion of the stent, there is a focal stenoses of the supraclinoid right ICA of approximately 70%.  More distally the right middle cerebral artery is seen to opacify into the capillary and the venous phases with delayed hemodynamic flow through the vessel. There is only a miniscule opacification of the hypoplastic right anterior cerebral artery A1 segment.  The right vertebral artery origin is normal.  The vessel opacifies normally to the cranial skull base. There is no visualization of the right posterior inferior cerebellar artery.  The basilar artery, the posterior cerebral arteries, the superior cerebellar arteries opacify normally into the capillary and the venous phases.  A right  anterior inferior cerebellar artery opacification is noted with a focal stenoses at the origin of the vessel.  There is no opacification of the left anterior inferior cerebellar artery.  The right posterior cerebral artery demonstrates a 50% narrowing at the distal P1 segment.  The left vertebral artery origin is from the aortic arch but with the origins of the left common carotid artery and left subclavian arteries.  Normal opacification of the vertebrobasilar junction is seen.  Opacification of the left posterior inferior cerebellar artery reveals focal areas of caliber irregularity with a focal area of severe stenosis in the proximal 1/3 of the left posterior-inferior cerebellar artery. The left vertebrobasilar junction demonstrates moderate stenosis just proximal to the basilar artery. Opacification is seen of the posterior cerebral arteries, superior cerebellar arteries and the right anterior inferior cerebellar artery. Again there is no visualization of the left anterior inferior cerebellar artery. The right anterior inferior cerebellar artery appears to supply the right posterior-inferior cerebellar artery territory.  Inflow of non-opacified blood is seen in the basilar artery from the contralateral vertebral artery.  The left subclavian artery injection demonstrates normal opacification of the left subclavian artery and the left thyrocervical trunk.  IMPRESSION: Approximately 75% narrowing of the left internal carotid artery petrous cavernous junction. This finding is new when compared to the previous angiogram.  Approximately 55% narrowing of the left internal carotid artery at the bulb by NASCET criteria. This has progressed compared to the previous arteriogram.  Approximately 80% intra stent stenoses at the right internal carotid artery petrous cavernous junction, with a 70% stenoses which is new of the supraclinoid right ICA.  Nonvisualization of the right posterior inferior cerebellar artery, and of the  left anterior inferior cerebellar artery.  Approximately 50% narrowing of the right posterior cerebral artery distal P1 segment.   Electronically Signed   By: Julieanne Cotton M.D.   On: 08/11/2014 15:57   Ir Angio Vertebral Sel  Vertebral Bilat Mod Sed  08/12/2014   CLINICAL DATA:  Generalized weakness. Previous history of right internal carotid artery symptomatic stenosis now requiring stent assisted angioplasty. Rescue reangioplasty for intrastent stenosis. Recent abnormal MRA of the brain.  EXAM: BILATERAL COMMON CAROTID AND INNOMINATE ANGIOGRAPHY AND BILATERAL VERTEBRAL ARTERY ANGIOGRAMS  PROCEDURE: Contrast:  OMNIPAQUE IOHEXOL 300 MG/ML  SOLN  Anesthesia/Sedation:  Conscious sedation.  Medications: Versed 1 mg IV.  Fentanyl 25 mcg IV.  Following a full explanation of the procedure along with the potential associated complications, an informed witnessed consent was obtained.  The right groin was prepped and draped in the usual sterile fashion. Thereafter using modified Seldinger technique, transfemoral access into the right common femoral artery was obtained without difficulty. Over a 0.035 inch guidewire, a 5 French Pinnacle sheath was inserted. Through this, and also over 0.035 inch guidewire, a 5 French JB1 catheter was advanced to the aortic arch region and selectively positioned in the right common carotid artery, the right vertebral artery, the left common carotid artery and the left vertebral artery and left subclavian artery.  There were no acute complications. The patient tolerated the procedure well.  FINDINGS: The left common carotid arteriogram demonstrates the left external carotid artery and its major branches to be widely patent.  The left internal carotid artery at the bulb demonstrates approximately 55% narrowing secondary to a circumferential plaque.  Distal to this the vessel is seen to opacify to the cranial skull base.  The petrous segment is normal.  The petrous cavernous junction  demonstrates approximately 75% narrowing secondary to a circumferential plaque.  Distal to this the cavernous and the supraclinoid segments are widely patent.  The left middle and the left anterior cerebral arteries opacify normally into the capillary and the venous phases. Cross opacification via the anterior communicating artery of the right anterior cerebral artery A2 segment is seen.  The right common carotid arteriogram demonstrates the right external carotid artery and its major branches to be widely patent.  The left internal carotid artery at the bulb and just distal to the bulb demonstrates mild narrowing of approximately 20% by NASCET criteria.  Distal to this the right internal carotid artery is seen to opacify normally to the cranial skull base.  The petrous cavernous junction at the site of the previously stented point again demonstrates a severe focal stenosis within the stent of approximately 80%. Distal to this the vessel resumes normal caliber.  Distal to the distal portion of the stent, there is a focal stenoses of the supraclinoid right ICA of approximately 70%.  More distally the right middle cerebral artery is seen to opacify into the capillary and the venous phases with delayed hemodynamic flow through the vessel. There is only a miniscule opacification of the hypoplastic right anterior cerebral artery A1 segment.  The right vertebral artery origin is normal.  The vessel opacifies normally to the cranial skull base. There is no visualization of the right posterior inferior cerebellar artery.  The basilar artery, the posterior cerebral arteries, the superior cerebellar arteries opacify normally into the capillary and the venous phases.  A right anterior inferior cerebellar artery opacification is noted with a focal stenoses at the origin of the vessel.  There is no opacification of the left anterior inferior cerebellar artery.  The right posterior cerebral artery demonstrates a 50% narrowing at  the distal P1 segment.  The left vertebral artery origin is from the aortic arch but with the origins of the left common carotid  artery and left subclavian arteries.  Normal opacification of the vertebrobasilar junction is seen.  Opacification of the left posterior inferior cerebellar artery reveals focal areas of caliber irregularity with a focal area of severe stenosis in the proximal 1/3 of the left posterior-inferior cerebellar artery. The left vertebrobasilar junction demonstrates moderate stenosis just proximal to the basilar artery. Opacification is seen of the posterior cerebral arteries, superior cerebellar arteries and the right anterior inferior cerebellar artery. Again there is no visualization of the left anterior inferior cerebellar artery. The right anterior inferior cerebellar artery appears to supply the right posterior-inferior cerebellar artery territory.  Inflow of non-opacified blood is seen in the basilar artery from the contralateral vertebral artery.  The left subclavian artery injection demonstrates normal opacification of the left subclavian artery and the left thyrocervical trunk.  IMPRESSION: Approximately 75% narrowing of the left internal carotid artery petrous cavernous junction. This finding is new when compared to the previous angiogram.  Approximately 55% narrowing of the left internal carotid artery at the bulb by NASCET criteria. This has progressed compared to the previous arteriogram.  Approximately 80% intra stent stenoses at the right internal carotid artery petrous cavernous junction, with a 70% stenoses which is new of the supraclinoid right ICA.  Nonvisualization of the right posterior inferior cerebellar artery, and of the left anterior inferior cerebellar artery.  Approximately 50% narrowing of the right posterior cerebral artery distal P1 segment.   Electronically Signed   By: Julieanne Cotton M.D.   On: 08/11/2014 15:57    Microbiology: Recent Results (from the  past 240 hour(s))  Urine culture     Status: None   Collection Time: 08/10/14  2:12 PM  Result Value Ref Range Status   Specimen Description URINE, RANDOM  Final   Special Requests NONE  Final   Colony Count   Final    20,OOO COLONIES/ML Performed at [facility]    Culture   Final    PROTEUS MIRABILIS Note: Confirmed Extended Spectrum Beta-Lactamase Producer (ESBL) CRITICAL RESULT CALLED TO, READ BACK BY AND VERIFIED WITH: MONICA SMITH AT 10:17 P.M. ON 08/13/2014 WARRB Performed at [facility]    Report Status 08/13/2014 FINAL  Final   Organism ID, Bacteria PROTEUS MIRABILIS  Final      Susceptibility   Proteus mirabilis - MIC*    AMPICILLIN >=32 RESISTANT Resistant     CEFAZOLIN >=64 RESISTANT Resistant     CEFTRIAXONE >=64 RESISTANT Resistant     CIPROFLOXACIN >=4 RESISTANT Resistant     GENTAMICIN <=1 SENSITIVE Sensitive     LEVOFLOXACIN >=8 RESISTANT Resistant     NITROFURANTOIN 128 RESISTANT Resistant     TOBRAMYCIN <=1 SENSITIVE Sensitive     TRIMETH/SULFA >=320 RESISTANT Resistant     IMIPENEM 2 SENSITIVE Sensitive     PIP/TAZO <=4 SENSITIVE Sensitive     * PROTEUS MIRABILIS     Labs: Basic Metabolic Panel:  Recent Labs Lab 08/09/14 2036 08/09/14 2203 08/10/14 0400 08/11/14 0710 08/14/14 0610  NA 136 137  --  142 143  K 3.9 4.1  --  4.0 3.6  CL 100 98  --  109 107  CO2 23  --   --  28 28  GLUCOSE 330* 362*  --  161* 158*  BUN 38* 41*  --  22 7  CREATININE 1.51* 1.50* 1.50* 1.00 0.94  CALCIUM 9.0  --   --  9.0 9.1   Liver Function Tests:  Recent Labs Lab 08/09/14 2036  AST 44*  ALT 22  ALKPHOS 83  BILITOT 1.1  PROT 7.3  ALBUMIN 3.7   No results for input(s): LIPASE, AMYLASE in the last 168 hours. No results for input(s): AMMONIA in the last 168 hours. CBC:  Recent Labs Lab 08/09/14 2036 08/09/14 2203 08/10/14 0400 08/14/14 0610  WBC 9.8  --  7.3 6.0  NEUTROABS 7.2  --   --   --   HGB 12.5* 12.6* 11.2* 11.5*  HCT 36.6*  37.0* 33.5* 33.7*  MCV 87.4  --  89.1 86.6  PLT 193  --  153 132*   Cardiac Enzymes:  Recent Labs Lab 08/09/14 2036 08/10/14 0400 08/10/14 0737 08/10/14 1355  TROPONINI 0.04* 0.04* 0.04* 0.03   BNP: BNP (last 3 results) No results for input(s): BNP in the last 8760 hours.  ProBNP (last 3 results) No results for input(s): PROBNP in the last 8760 hours.  CBG:  Recent Labs Lab 08/14/14 1621 08/14/14 2020 08/15/14 0006 08/15/14 0413 08/15/14 0804  GLUCAP 198* 249* 187* 151* 132*       Signed:  Jonette MateBURIEV, Zeniah Briney N  [facility] 08/15/2014, 10:02 AM

## 2014-08-15 NOTE — Progress Notes (Signed)
Writer tried to call report to the SNF X 2 attempts but failed. Will try again.

## 2014-08-15 NOTE — Clinical Social Work Note (Signed)
Clinical Social Worker facilitated patient discharge including contacting patient family and facility to confirm patient discharge plans.  Clinical information faxed to facility and family agreeable with plan.  CSW [facility]  RN to call report prior to discharge.  Clinical Social Worker will sign off for now as social work intervention is no longer needed. Please consult us again if new need arises.  Derenda FennelBashira Shae Augello, MSW, LCSWA [phone] 08/15/2014 10:29 AM

## 2014-08-18 ENCOUNTER — Other Ambulatory Visit (HOSPITAL_COMMUNITY): Payer: Self-pay | Admitting: Interventional Radiology

## 2014-08-18 DIAGNOSIS — I771 Stricture of artery: Secondary | ICD-10-CM

## 2014-08-19 ENCOUNTER — Inpatient Hospital Stay (HOSPITAL_COMMUNITY)
Admission: RE | Admit: 2014-08-19 | Discharge: 2014-08-19 | Disposition: A | Discharge status: Home / Self Care | Payer: Non-veteran care | Source: Ambulatory Visit | Attending: Interventional Radiology | Admitting: Interventional Radiology

## 2014-08-19 ENCOUNTER — Encounter (HOSPITAL_COMMUNITY)
Admission: RE | Disposition: A | Discharge status: Home / Self Care | Payer: Self-pay | Source: Ambulatory Visit | Attending: Interventional Radiology

## 2014-08-19 ENCOUNTER — Encounter (HOSPITAL_COMMUNITY): Payer: Self-pay

## 2014-08-19 ENCOUNTER — Inpatient Hospital Stay (HOSPITAL_COMMUNITY): Payer: Medicare HMO | Admitting: Anesthesiology

## 2014-08-19 ENCOUNTER — Ambulatory Visit (HOSPITAL_COMMUNITY)
Admission: RE | Admit: 2014-08-19 | Discharge: 2014-08-19 | Disposition: A | Discharge status: Home / Self Care | Payer: Medicare HMO | Source: Ambulatory Visit | Attending: Interventional Radiology | Admitting: Interventional Radiology

## 2014-08-19 ENCOUNTER — Other Ambulatory Visit (HOSPITAL_COMMUNITY): Payer: Non-veteran care

## 2014-08-19 ENCOUNTER — Encounter (HOSPITAL_COMMUNITY): Payer: Self-pay | Admitting: Anesthesiology

## 2014-08-19 DIAGNOSIS — I6522 Occlusion and stenosis of left carotid artery: Secondary | ICD-10-CM | POA: Diagnosis not present

## 2014-08-19 DIAGNOSIS — Z538 Procedure and treatment not carried out for other reasons: Secondary | ICD-10-CM | POA: Diagnosis not present

## 2014-08-19 LAB — CBC WITH DIFFERENTIAL/PLATELET
Basophils Absolute: 0 10*3/uL (ref 0.0–0.1)
Basophils Relative: 0 % (ref 0–1)
EOS ABS: 0.1 10*3/uL (ref 0.0–0.7)
Eosinophils Relative: 2 % (ref 0–5)
HCT: 34.4 % — ABNORMAL LOW (ref 39.0–52.0)
Hemoglobin: 11.7 g/dL — ABNORMAL LOW (ref 13.0–17.0)
LYMPHS ABS: 1.8 10*3/uL (ref 0.7–4.0)
Lymphocytes Relative: 28 % (ref 12–46)
MCH: 30.6 pg (ref 26.0–34.0)
MCHC: 34 g/dL (ref 30.0–36.0)
MCV: 90.1 fL (ref 78.0–100.0)
MONO ABS: 0.4 10*3/uL (ref 0.1–1.0)
MONOS PCT: 7 % (ref 3–12)
NEUTROS PCT: 63 % (ref 43–77)
Neutro Abs: 4 10*3/uL (ref 1.7–7.7)
Platelets: 136 10*3/uL — ABNORMAL LOW (ref 150–400)
RBC: 3.82 MIL/uL — ABNORMAL LOW (ref 4.22–5.81)
RDW: 17.1 % — ABNORMAL HIGH (ref 11.5–15.5)
WBC: 6.4 10*3/uL (ref 4.0–10.5)

## 2014-08-19 LAB — COMPREHENSIVE METABOLIC PANEL
ALT: 51 U/L (ref 0–53)
AST: 23 U/L (ref 0–37)
Albumin: 3.3 g/dL — ABNORMAL LOW (ref 3.5–5.2)
Alkaline Phosphatase: 80 U/L (ref 39–117)
Anion gap: 8 (ref 5–15)
BILIRUBIN TOTAL: 0.5 mg/dL (ref 0.3–1.2)
BUN: 9 mg/dL (ref 6–23)
CO2: 24 mmol/L (ref 19–32)
Calcium: 8.6 mg/dL (ref 8.4–10.5)
Chloride: 110 mmol/L (ref 96–112)
Creatinine, Ser: 0.89 mg/dL (ref 0.50–1.35)
GFR calc Af Amer: >90 mL/min (ref >90)
GFR calc non Af Amer: >90 mL/min (ref >90)
Glucose, Bld: 156 mg/dL — ABNORMAL HIGH (ref 70–99)
POTASSIUM: 3.4 mmol/L — AB (ref 3.5–5.1)
SODIUM: 142 mmol/L (ref 135–145)
TOTAL PROTEIN: 6.2 g/dL (ref 6.0–8.3)

## 2014-08-19 LAB — PROTIME-INR
INR: 1.09 (ref 0.00–1.49)
Prothrombin Time: 14.3 seconds (ref 11.6–15.2)

## 2014-08-19 LAB — APTT: APTT: 29 s (ref 24–37)

## 2014-08-19 LAB — PLATELET INHIBITION P2Y12: Platelet Function  P2Y12: 288 [PRU] (ref 194–418)

## 2014-08-19 SURGERY — RADIOLOGY WITH ANESTHESIA
Anesthesia: General | Laterality: Left

## 2014-08-19 MED ORDER — CEFAZOLIN SODIUM-DEXTROSE 2-3 GM-% IV SOLR
INTRAVENOUS | Status: AC
  Filled 2014-08-19: qty 50

## 2014-08-19 MED ORDER — METOPROLOL TARTRATE 12.5 MG HALF TABLET
ORAL_TABLET | ORAL | Status: AC
  Filled 2014-08-19: qty 2

## 2014-08-19 MED ORDER — NIMODIPINE 30 MG PO CAPS: 0.0000 mg | ORAL_CAPSULE | ORAL | Status: DC

## 2014-08-19 MED ORDER — CLOPIDOGREL BISULFATE 75 MG PO TABS: 75.0000 mg | ORAL_TABLET | Freq: Once | ORAL | Status: DC

## 2014-08-19 MED ORDER — NIMODIPINE 30 MG PO CAPS
ORAL_CAPSULE | ORAL | Status: AC
Start: 2014-08-19 — End: 2014-08-19
  Administered 2014-08-19: 60 mg via ORAL
  Filled 2014-08-19: qty 2

## 2014-08-19 MED ORDER — CLOPIDOGREL BISULFATE 75 MG PO TABS
ORAL_TABLET | ORAL | Status: AC
  Administered 2014-08-19: 75 mg via ORAL
  Filled 2014-08-19: qty 1

## 2014-08-19 MED ORDER — METOPROLOL TARTRATE 25 MG PO TABS
25.0000 mg | ORAL_TABLET | Freq: Once | ORAL | Status: AC
  Administered 2014-08-19: 25 mg via ORAL
  Filled 2014-08-19: qty 1

## 2014-08-19 MED ORDER — SODIUM CHLORIDE 0.9 % IV SOLN: Freq: Once | INTRAVENOUS | Status: DC

## 2014-08-19 MED ORDER — ASPIRIN EC 325 MG PO TBEC
DELAYED_RELEASE_TABLET | ORAL | Status: AC
  Administered 2014-08-19: 325 mg via ORAL
  Filled 2014-08-19: qty 1

## 2014-08-19 MED ORDER — CEFAZOLIN SODIUM-DEXTROSE 2-3 GM-% IV SOLR: 2.0000 g | Freq: Once | INTRAVENOUS | Status: DC

## 2014-08-19 MED ORDER — ASPIRIN EC 325 MG PO TBEC: 325.0000 mg | DELAYED_RELEASE_TABLET | Freq: Once | ORAL | Status: DC

## 2014-08-19 NOTE — Progress Notes (Signed)
Patient ID: Clayton [name], male   DOB: [birth year], 59 y.o.   MRN: [id number]   Pt was scheduled for R ICA intra stent and beyond stent stenosis pta/stent in IR.  [id number] today. Too high to safely perform procedure per Dr Camillia Hertereveshwar  Cancel today Will change medication to Plavix 1 and half po daily                                             And ASA 325 mg daily.  Will recheck labs in 1 week Plan to reschedule procedure accordingly

## 2014-08-19 NOTE — Progress Notes (Signed)
Pt. Procedure cancelled. [facility] cancelled [facility]. Dr. Corliss Skainseveshwar  's consultation report and medications changes reviewed with [facility]ances.[facility] and originals sent back with the patient.

## 2014-08-19 NOTE — H&P (Signed)
Chief Complaint: CVA  Referring Physician(s): Deveshwar,Sanjeev K  History of Present Illness: Clayton [name] is a 58 y.o. male   Known to Interventional Radiology  CVA 2008 Hx R Internal carotid artery angioplasty /stent placed 10/2006 Developed slurred speech and visual changes 08/10/14 Work up revealed advanced chronic ischemic changes 08/11/14 arteriogram showed  R ICA Intra stent stenosis and new supraclinoid area stenosis Now scheduled for Intra stent and supraclinoid area stenosis angioplasty/poss stent placement in IR with Dr Corliss Skains Pt has no complaints Speech (although still somewhat slurred) and vision appear baseline  Past Medical History  Diagnosis Date  . Stroke   . Coronary artery disease   . Diabetes mellitus without complication   . Hypertension   . MI (myocardial infarction)   . Hyperlipidemia   . GERD (gastroesophageal reflux disease)   . Diabetic neuropathy   . Diabetic retinopathy     Past Surgical History  Procedure Laterality Date  . Below knee leg amputation Left   . Appendectomy    . Percutaneous placement intravascular stent cervical carotid artery Right     Allergies: Niaspan  Medications: Prior to Admission medications   Medication Sig Start Date End Date Taking? Authorizing Provider  aspirin 325 MG tablet Take 1 tablet (325 mg total) by mouth daily. 08/15/14  Yes Esperanza Sheets, MD  atorvastatin (LIPITOR) 40 MG tablet Take 40 mg by mouth daily.   Yes Historical Provider, MD  cholecalciferol (VITAMIN D) 1000 UNITS tablet Take 2,000 Units by mouth 2 (two) times daily.   Yes Historical Provider, MD  clopidogrel (PLAVIX) 75 MG tablet Take 1 tablet (75 mg total) by mouth daily. 08/15/14  Yes Esperanza Sheets, MD  furosemide (LASIX) 40 MG tablet Take 1 tablet (40 mg total) by mouth daily as needed for fluid or edema. 08/15/14  Yes Esperanza Sheets, MD  gabapentin (NEURONTIN) 300 MG capsule Take 300 mg by mouth 4 (four) times daily.   Yes  Historical Provider, MD  HYDROcodone-acetaminophen (NORCO/VICODIN) 5-325 MG per tablet Take 1 tablet by mouth 3 (three) times daily as needed for moderate pain. 08/15/14  Yes Esperanza Sheets, MD  insulin aspart (NOVOLOG) 100 UNIT/ML injection Inject 0-15 Units into the skin 3 (three) times daily with meals. 08/15/14  Yes Esperanza Sheets, MD  insulin glargine (LANTUS) 100 UNIT/ML injection Inject 45 Units into the skin 2 (two) times daily.   Yes Historical Provider, MD  lisinopril (PRINIVIL,ZESTRIL) 2.5 MG tablet Take 2.5 mg by mouth daily.   Yes Historical Provider, MD  metoprolol (LOPRESSOR) 50 MG tablet Take 25 mg by mouth 2 (two) times daily.   Yes Historical Provider, MD  Multiple Vitamin (MULTIVITAMIN WITH MINERALS) TABS tablet Take 1 tablet by mouth daily.   Yes Historical Provider, MD    History reviewed. No pertinent family history.  History   Social History  . Marital Status: Divorced    Spouse Name: N/A    Number of Children: N/A  . Years of Education: N/A   Social History Main Topics  . Smoking status: Former Games developer  . Smokeless tobacco: None  . Alcohol Use: No  . Drug Use: None  . Sexual Activity: None   Other Topics Concern  . None   Social History Narrative    Review of Systems: A 12 point ROS discussed and pertinent positives are indicated in the HPI above.  All other systems are negative.  Review of Systems  Constitutional: Positive for fatigue. Negative for fever,  activity change and appetite change.  HENT: Negative for tinnitus, trouble swallowing and voice change.   Respiratory: Negative for chest tightness and shortness of breath.   Cardiovascular: Negative for chest pain and leg swelling.  Gastrointestinal: Negative for abdominal pain.  Genitourinary: Negative for difficulty urinating.  Musculoskeletal: Negative for back pain and neck pain.  Neurological: Positive for speech difficulty and weakness. Negative for dizziness, tremors, seizures, syncope,  facial asymmetry, light-headedness, numbness and headaches.  Psychiatric/Behavioral: Negative for behavioral problems and confusion.    Vital Signs: There were no vitals taken for this visit.  Physical Exam  Constitutional: He is oriented to person, place, and time. He appears well-nourished.  Cardiovascular: Normal rate, regular rhythm and normal heart sounds.   Pulmonary/Chest: Effort normal and breath sounds normal. He has no wheezes.  Abdominal: Soft. Bowel sounds are normal. There is no tenderness.  Musculoskeletal: Normal range of motion.  L BKA R toes amputation  Neurological: He is alert and oriented to person, place, and time.  Skin: Skin is warm and dry.  Psychiatric: He has a normal mood and affect. His behavior is normal. Judgment and thought content normal.  Nursing note and vitals reviewed.   Imaging: Dg Chest 2 View  08/09/2014   CLINICAL DATA:  Unable to walk since yesterday. Weakness and unable to stand. Speech difficulty yesterday that better now. Face is non today. History of prior stroke, MI, CABG, coronary artery disease, diabetes.  EXAM: CHEST  2 VIEW  COMPARISON:  03/18/2008  FINDINGS: Postoperative changes in the mediastinum. Normal heart size and pulmonary vascularity. No focal airspace disease or consolidation in the lungs. No blunting of costophrenic angles. No pneumothorax. Mediastinal contours appear intact. Degenerative changes in the spine and shoulders.  IMPRESSION: No active cardiopulmonary disease.   Electronically Signed   By: Burman Nieves M.D.   On: 08/09/2014 22:45   Ct Head Wo Contrast  08/09/2014   CLINICAL DATA:  Weakness and numbness  EXAM: CT HEAD WITHOUT CONTRAST  TECHNIQUE: Contiguous axial images were obtained from the base of the skull through the vertex without intravenous contrast.  COMPARISON:  06/14/2007  FINDINGS: There is no intracranial hemorrhage, mass or evidence of acute infarction. There is old infarction with encephalomalacia in  the left centrum semiovale. There is old infarction in the pons, right greater than left. These are unchanged from 2008. Additional smaller old infarctions are present in the periventricular frontal white matter, right greater than left. There is mild generalized atrophy and mild chronic microvascular ischemic disease. There is no extra-axial fluid collection. Calvarium and skullbase are intact.  IMPRESSION: Mild atrophy and chronic microvascular ischemic disease. 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 No acute findings are evident.   Electronically Signed   By: Ellery Plunk M.D.   On: 08/09/2014 22:30   Mri Brain Without Contrast  08/10/2014   CLINICAL DATA:  Personal previous history of stroke and vascular disease. Acute presentation with slurred speech and weakness. Symptoms began last night.  EXAM: MRI HEAD WITHOUT CONTRAST  TECHNIQUE: Multiplanar, multiecho pulse sequences of the brain and surrounding structures were obtained without intravenous contrast.  COMPARISON:  Head CT 08/09/2014 in 06/14/2007.  MRI 01/17/2006.  FINDINGS: Diffusion imaging does not show any acute or subacute infarction. There is extensive old infarction throughout the pons bilaterally. There are old small vessel cerebellar infarctions in their cerebellar atrophy. There are old infarctions in the left basal ganglia and radiating white matter tracts and moderate chronic small-vessel ischemic changes throughout  the cerebral hemispheric white matter. No cortical infarction is seen within the cerebral hemispheres. No neoplastic mass lesion, hemorrhage, hydrocephalus or extra-axial collection. No pituitary mass. No inflammatory sinus disease. No skull or skullbase lesion. Major vessels at the base of the brain show flow.  IMPRESSION: Advanced chronic ischemic changes affecting the pons and remainder the brainstem. Old deep brain infarctions including the left basal ganglia up. Chronic small vessel disease. No acute or  subacute infarction or other reversible process.   Electronically Signed   By: Paulina Fusi M.D.   On: 08/10/2014 07:34   Dg Swallowing Func-speech Pathology  08/12/2014   Carolan Shiver, CCC-SLP     08/12/2014 11:30 AM  Objective Swallowing Evaluation: Modified Barium Swallowing Study   Patient Details  Name: Clayton [name] MRN: [id number] Date of Birth: April 06, 1956  Today's Date: 08/12/2014 Time: SLP Start Time (ACUTE ONLY): 1030-SLP Stop Time (ACUTE  ONLY): 1100 SLP Time Calculation (min) (ACUTE ONLY): 30 min  Past Medical History:  Past Medical History  Diagnosis Date  . Stroke   . Coronary artery disease   . Diabetes mellitus without complication   . Hypertension   . MI (myocardial infarction)   . Hyperlipidemia   . GERD (gastroesophageal reflux disease)   . Diabetic neuropathy   . Diabetic retinopathy    Past Surgical History:  Past Surgical History  Procedure Laterality Date  . Below knee leg amputation Left   . Appendectomy    . Percutaneous placement intravascular stent cervical carotid  artery Right    HPI:  HPI: 59 yo man with PMH of stroke, CAD, DM with neuropathy, s/p  Left BKA, HTN, GERD, HLD, h/o MI who presents with slurred speech  and weakness. Mr. Sahni has a long history of neurological  symptoms. He had a stroke in 2006 and then in 2007. Since that  time he has had residual weakness on the left and speech issues.  Jan29 noticed that his speech became more garbled and he also  began to feel more unsteady, with worsening weakness next 24  hours. MRI reveals advanced chronic ischemic changes affecting  the pons and remainder the brainstem. Old deep brain infarctions  including the left basal ganglia. Chronic small vessel disease.  No acute or subacute infarction.    No Data Recorded  Assessment / Plan / Recommendation CHL IP CLINICAL IMPRESSIONS 08/12/2014  Dysphagia Diagnosis Mild pharyngeal phase dysphagia  Clinical impression Pt presents with a likely acute-on-chronic  mild dysphagia marked by  reduced anterior/superior movement of  the hyolaryngeal complex.  This deficit leads to high penetration  of thin and nectar-thick liquids, and trace aspiration of thin  liquids when consumed in large, consecutive boluses.  When pt  limited sips of thin to singular, smaller boluses, aspiration did  not occur.  Pt viewed the MBS in real time - we discussed the  necessity of monitoring bolus quantity and rate of consumption.   Pt verbalized understanding.  Recommend dysphagia 3, thin  liquids; continue meds whole in puree.  SLP to follow briefly for  use of precautions as instructed.        CHL IP TREATMENT RECOMMENDATION 08/12/2014  Treatment Plan Recommendations Therapy as outlined in treatment  plan below     CHL IP DIET RECOMMENDATION 08/12/2014  Diet Recommendations Dysphagia 3 (Mechanical Soft);Thin liquid  Liquid Administration via Cup  Medication Administration Whole meds with puree  Compensations Slow rate;Small sips/bites;Clear throat  intermittently  Postural Changes and/or Swallow Maneuvers Seated  upright 90  degrees     CHL IP OTHER RECOMMENDATIONS 08/12/2014  Recommended Consults (None)  Oral Care Recommendations Oral care BID  Other Recommendations (None)     CHL IP FOLLOW UP RECOMMENDATIONS 08/12/2014  Follow up Recommendations None     CHL IP FREQUENCY AND DURATION 08/10/2014  Speech Therapy Frequency (ACUTE ONLY) min 2x/week Treatment  Duration 2 weeks         SLP Swallow Goals No flowsheet data found.  No flowsheet data found.    CHL IP REASON FOR REFERRAL 08/12/2014  Reason for Referral Objectively evaluate swallowing function     CHL IP ORAL PHASE 08/12/2014  Lips (None)  Tongue (None)  Mucous membranes (None)  Nutritional status (None)  Other (None)  Oxygen therapy (None)  Oral Phase WFL  Oral - Pudding Teaspoon (None)  Oral - Pudding Cup (None)  Oral - Honey Teaspoon (None)  Oral - Honey Cup (None)  Oral - Honey Syringe (None)  Oral - Nectar Teaspoon (None)  Oral - Nectar Cup (None)  Oral - Nectar Straw  (None)  Oral - Nectar Syringe (None) Oral - Ice Chips (None)  Oral - Thin Teaspoon (None)  Oral - Thin Cup (None)  Oral - Thin Straw (None)  Oral - Thin Syringe (None)  Oral - Puree (None)  Oral - Mechanical Soft (None)  Oral - Regular (None)  Oral - Multi-consistency (None)  Oral - Pill (None)  Oral Phase - Comment (None)      CHL IP PHARYNGEAL PHASE 08/12/2014  Pharyngeal Phase Impaired  Pharyngeal - Pudding Teaspoon (None)  Penetration/Aspiration details (pudding teaspoon) (None)  Pharyngeal - Pudding Cup (None)  Penetration/Aspiration details (pudding cup) (None)  Pharyngeal - Honey Teaspoon (None)  Penetration/Aspiration details (honey teaspoon) (None)  Pharyngeal - Honey Cup (None)  Penetration/Aspiration details (honey cup) (None)  Pharyngeal - Honey Syringe (None)  Penetration/Aspiration details (honey syringe) (None)  Pharyngeal - Nectar Teaspoon (None)  Penetration/Aspiration details (nectar teaspoon) (None)  Pharyngeal - Nectar Cup Reduced anterior laryngeal  mobility;Reduced laryngeal elevation;Penetration/Aspiration  during swallow  Penetration/Aspiration details (nectar cup) Material enters  airway, remains ABOVE vocal cords then ejected out  Pharyngeal - Nectar Straw (None)  Penetration/Aspiration details (nectar straw) (None)  Pharyngeal - Nectar Syringe (None)  Penetration/Aspiration details (nectar syringe) (None)  Pharyngeal - Ice Chips (None)  Penetration/Aspiration details (ice chips) (None)  Pharyngeal - Thin Teaspoon (None)  Penetration/Aspiration details (thin teaspoon) (None)  Pharyngeal - Thin Cup Reduced anterior laryngeal mobility;Reduced  laryngeal elevation;Penetration/Aspiration during swallow  Penetration/Aspiration details (thin cup) Material enters airway,  passes BELOW cords and not ejected out despite cough attempt by  patient;Material enters airway, passes BELOW cords without  attempt by patient to eject out (silent aspiration)  Pharyngeal - Thin Straw (None)   Penetration/Aspiration details (thin straw) (None)  Pharyngeal - Thin Syringe (None)  Penetration/Aspiration details (thin syringe') (None)  Pharyngeal - Puree Reduced anterior laryngeal mobility;Reduced  laryngeal elevation;Pharyngeal residue - valleculae  Penetration/Aspiration details (puree) (None)  Pharyngeal - Mechanical Soft Reduced anterior laryngeal  mobility;Reduced laryngeal elevation;Pharyngeal residue -  valleculae  Penetration/Aspiration details (mechanical soft) (None)  Pharyngeal - Regular (None)  Penetration/Aspiration details (regular) (None)  Pharyngeal - Multi-consistency (None)  Penetration/Aspiration details (multi-consistency) (None)  Pharyngeal - Pill (None)  Penetration/Aspiration details (pill) (None)  Pharyngeal Comment (None)     No flowsheet data found.  CHL IP GO 08/12/2014  Functional Assessment Tool Used clinical judgement  Functional Limitations Swallowing  Swallow Current Status (Z6109) CJ  Swallow Goal Status [phone]) CI  Swallow Discharge Status [phone]) (None)  Motor Speech Current Status [phone]) (None)  Motor Speech Goal Status [phone]) (None)  Motor Speech Goal Status [phone]) (None)  Spoken Language Comprehension Current Status [phone]) (None)  Spoken Language Comprehension Goal Status (V7846) (None)  Spoken Language Comprehension Discharge Status [phone]) (None)  Spoken Language Expression Current Status [phone]) (None)  Spoken Language Expression Goal Status [phone]) (None)  Spoken Language Expression Discharge Status [phone]) (None)  Attention Current Status (D6644) (None)  Attention Goal Status (I3474) (None)  Attention Discharge Status [phone]) (None)  Memory Current Status (L8756) (None)  Memory Goal Status (E3329) (None)  Memory Discharge Status (J1884) (None) Voice Current Status  (Z6606) (None)  Voice Goal Status (T0160) (None)  Voice Discharge Status [phone]) (None)  Other Speech-Language Pathology Functional Limitation [phone])  (None)  Other Speech-Language Pathology Functional  Limitation Goal Status  (U2025) (None)  Other Speech-Language Pathology Functional Limitation Discharge  Status [phone]) (None)          Amanda L. Samson Frederic, [place] CCC/SLP Pager [phone]  Blenda Mounts Laurice 08/12/2014, 11:29 AM    Mr Maxine Glenn Head/brain Wo Cm  08/10/2014   CLINICAL DATA:  Widespread cardiovascular disease. Slurred speech and weakness. Chronic ischemic changes of the brain particularly in the pons.  EXAM: MRA HEAD WITHOUT CONTRAST  TECHNIQUE: Angiographic images of the Circle of Willis were obtained using MRA technique without intravenous contrast.  COMPARISON:  MRI brain same day.  Angiography 06/14/2007.  FINDINGS: Severe stenosis of the right internal carotid artery at the skullbase is again demonstrated. At the proximal carotid canal, there is a 50% diameter stenosis. At the proximal siphon, the lumen is reduced to 1 mm or less, but antegrade flow does persist. Beyond that, there is pronounced atherosclerotic irregularity in the siphon region. At the distal siphon/ supra clinoid internal carotid artery, there is severe stenosis and irregularity with luminal diameter of 1 mm. No flow is seen in the right anterior cerebral artery. Right middle cerebral artery is patent without proximal stenosis.  The left internal carotid artery is widely patent through the carotid canal. There is advanced atherosclerotic disease in the siphon region with narrowing and irregularity particularly in the proximal siphon. Stenosis in that region is estimated at 50-70%. Pronounced irregularity is seen beyond that. Narrowing of the distal siphon is approximately 30%. The anterior and middle cerebral vessels are patent, with flow from this site to supplying both anterior cerebral arteries. There is no proximal left middle cerebral artery stenosis.  Both vertebral arteries are widely patent through the foramen magnum to the basilar. The right is larger than the left. No basilar stenosis. Flow is demonstrated in a left posterior  inferior cerebellar artery, bilateral anterior inferior cerebellar arteries, bilateral superior cerebellar arteries, and both posterior cerebral arteries. Medium to small branch vessels show atherosclerotic narrowing and irregularity.  IMPRESSION: Severe extracranial stenotic disease of the right internal carotid artery. 50% stenosis of the proximal right carotid canal. Very severe stenosis at the proximal right siphon with luminal diameter 1 mm or less. Severe stenosis and irregularity throughout the right carotid siphon beyond that.  Moderate to severe atherosclerotic disease in the left carotid siphon. Pronounced irregularity of the vessel, but with maximal stenosis estimated at 50-70%.  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  No correctable proximal MCA stenosis.  Distal vessel atherosclerotic narrowing and irregularity, particularly evident in the posterior circulation branches.  Electronically Signed   By: Paulina Fusi M.D.   On: 08/10/2014 09:31   Ir Angio Intra Extracran Sel Com Carotid Innominate Bilat Mod Sed  08/12/2014   CLINICAL DATA:  Generalized weakness. Previous history of right internal carotid artery symptomatic stenosis now requiring stent assisted angioplasty. Rescue reangioplasty for intrastent stenosis. Recent abnormal MRA of the brain.  EXAM: BILATERAL COMMON CAROTID AND INNOMINATE ANGIOGRAPHY AND BILATERAL VERTEBRAL ARTERY ANGIOGRAMS  PROCEDURE: Contrast:  OMNIPAQUE IOHEXOL 300 MG/ML  SOLN  Anesthesia/Sedation:  Conscious sedation.  Medications: Versed 1 mg IV.  Fentanyl 25 mcg IV.  Following a full explanation of the procedure along with the potential associated complications, an informed witnessed consent was obtained.  The right groin was prepped and draped in the usual sterile fashion. Thereafter using modified Seldinger technique, transfemoral access into the right common femoral artery was  obtained without difficulty. Over a 0.035 inch guidewire, a 5 French Pinnacle sheath was inserted. Through this, and also over 0.035 inch guidewire, a 5 French JB1 catheter was advanced to the aortic arch region and selectively positioned in the right common carotid artery, the right vertebral artery, the left common carotid artery and the left vertebral artery and left subclavian artery.  There were no acute complications. The patient tolerated the procedure well.  FINDINGS: The left common carotid arteriogram demonstrates the left external carotid artery and its major branches to be widely patent.  The left internal carotid artery at the bulb demonstrates approximately 55% narrowing secondary to a circumferential plaque.  Distal to this the vessel is seen to opacify to the cranial skull base.  The petrous segment is normal.  The petrous cavernous junction demonstrates approximately 75% narrowing secondary to a circumferential plaque.  Distal to this the cavernous and the supraclinoid segments are widely patent.  The left middle and the left anterior cerebral arteries opacify normally into the capillary and the venous phases. Cross opacification via the anterior communicating artery of the right anterior cerebral artery A2 segment is seen.  The right common carotid arteriogram demonstrates the right external carotid artery and its major branches to be widely patent.  The left internal carotid artery at the bulb and just distal to the bulb demonstrates mild narrowing of approximately 20% by NASCET criteria.  Distal to this the right internal carotid artery is seen to opacify normally to the cranial skull base.  The petrous cavernous junction at the site of the previously stented point again demonstrates a severe focal stenosis within the stent of approximately 80%. Distal to this the vessel resumes normal caliber.  Distal to the distal portion of the stent, there is a focal stenoses of the supraclinoid right ICA of  approximately 70%.  More distally the right middle cerebral artery is seen to opacify into the capillary and the venous phases with delayed hemodynamic flow through the vessel. There is only a miniscule opacification of the hypoplastic right anterior cerebral artery A1 segment.  The right vertebral artery origin is normal.  The vessel opacifies normally to the cranial skull base. There is no visualization of the right posterior inferior cerebellar artery.  The basilar artery, the posterior cerebral arteries, the superior cerebellar arteries opacify normally into the capillary and the venous phases.  A right anterior inferior cerebellar artery opacification is noted with a focal stenoses at the origin of the vessel.  There is no opacification of the left anterior inferior cerebellar artery.  The right posterior cerebral artery demonstrates a 50% narrowing at the  distal P1 segment.  The left vertebral artery origin is from the aortic arch but with the origins of the left common carotid artery and left subclavian arteries.  Normal opacification of the vertebrobasilar junction is seen.  Opacification of the left posterior inferior cerebellar artery reveals focal areas of caliber irregularity with a focal area of severe stenosis in the proximal 1/3 of the left posterior-inferior cerebellar artery. The left vertebrobasilar junction demonstrates moderate stenosis just proximal to the basilar artery. Opacification is seen of the posterior cerebral arteries, superior cerebellar arteries and the right anterior inferior cerebellar artery. Again there is no visualization of the left anterior inferior cerebellar artery. The right anterior inferior cerebellar artery appears to supply the right posterior-inferior cerebellar artery territory.  Inflow of non-opacified blood is seen in the basilar artery from the contralateral vertebral artery.  The left subclavian artery injection demonstrates normal opacification of the left  subclavian artery and the left thyrocervical trunk.  IMPRESSION: Approximately 75% narrowing of the left internal carotid artery petrous cavernous junction. This finding is new when compared to the previous angiogram.  Approximately 55% narrowing of the left internal carotid artery at the bulb by NASCET criteria. This has progressed compared to the previous arteriogram.  Approximately 80% intra stent stenoses at the right internal carotid artery petrous cavernous junction, with a 70% stenoses which is new of the supraclinoid right ICA.  Nonvisualization of the right posterior inferior cerebellar artery, and of the left anterior inferior cerebellar artery.  Approximately 50% narrowing of the right posterior cerebral artery distal P1 segment.   Electronically Signed   By: Julieanne Cotton M.D.   On: 08/11/2014 15:57   Ir Angio Vertebral Sel Vertebral Bilat Mod Sed  08/12/2014   CLINICAL DATA:  Generalized weakness. Previous history of right internal carotid artery symptomatic stenosis now requiring stent assisted angioplasty. Rescue reangioplasty for intrastent stenosis. Recent abnormal MRA of the brain.  EXAM: BILATERAL COMMON CAROTID AND INNOMINATE ANGIOGRAPHY AND BILATERAL VERTEBRAL ARTERY ANGIOGRAMS  PROCEDURE: Contrast:  OMNIPAQUE IOHEXOL 300 MG/ML  SOLN  Anesthesia/Sedation:  Conscious sedation.  Medications: Versed 1 mg IV.  Fentanyl 25 mcg IV.  Following a full explanation of the procedure along with the potential associated complications, an informed witnessed consent was obtained.  The right groin was prepped and draped in the usual sterile fashion. Thereafter using modified Seldinger technique, transfemoral access into the right common femoral artery was obtained without difficulty. Over a 0.035 inch guidewire, a 5 French Pinnacle sheath was inserted. Through this, and also over 0.035 inch guidewire, a 5 French JB1 catheter was advanced to the aortic arch region and selectively positioned in the  right common carotid artery, the right vertebral artery, the left common carotid artery and the left vertebral artery and left subclavian artery.  There were no acute complications. The patient tolerated the procedure well.  FINDINGS: The left common carotid arteriogram demonstrates the left external carotid artery and its major branches to be widely patent.  The left internal carotid artery at the bulb demonstrates approximately 55% narrowing secondary to a circumferential plaque.  Distal to this the vessel is seen to opacify to the cranial skull base.  The petrous segment is normal.  The petrous cavernous junction demonstrates approximately 75% narrowing secondary to a circumferential plaque.  Distal to this the cavernous and the supraclinoid segments are widely patent.  The left middle and the left anterior cerebral arteries opacify normally into the capillary and the venous phases. Cross opacification via the  anterior communicating artery of the right anterior cerebral artery A2 segment is seen.  The right common carotid arteriogram demonstrates the right external carotid artery and its major branches to be widely patent.  The left internal carotid artery at the bulb and just distal to the bulb demonstrates mild narrowing of approximately 20% by NASCET criteria.  Distal to this the right internal carotid artery is seen to opacify normally to the cranial skull base.  The petrous cavernous junction at the site of the previously stented point again demonstrates a severe focal stenosis within the stent of approximately 80%. Distal to this the vessel resumes normal caliber.  Distal to the distal portion of the stent, there is a focal stenoses of the supraclinoid right ICA of approximately 70%.  More distally the right middle cerebral artery is seen to opacify into the capillary and the venous phases with delayed hemodynamic flow through the vessel. There is only a miniscule opacification of the hypoplastic right  anterior cerebral artery A1 segment.  The right vertebral artery origin is normal.  The vessel opacifies normally to the cranial skull base. There is no visualization of the right posterior inferior cerebellar artery.  The basilar artery, the posterior cerebral arteries, the superior cerebellar arteries opacify normally into the capillary and the venous phases.  A right anterior inferior cerebellar artery opacification is noted with a focal stenoses at the origin of the vessel.  There is no opacification of the left anterior inferior cerebellar artery.  The right posterior cerebral artery demonstrates a 50% narrowing at the distal P1 segment.  The left vertebral artery origin is from the aortic arch but with the origins of the left common carotid artery and left subclavian arteries.  Normal opacification of the vertebrobasilar junction is seen.  Opacification of the left posterior inferior cerebellar artery reveals focal areas of caliber irregularity with a focal area of severe stenosis in the proximal 1/3 of the left posterior-inferior cerebellar artery. The left vertebrobasilar junction demonstrates moderate stenosis just proximal to the basilar artery. Opacification is seen of the posterior cerebral arteries, superior cerebellar arteries and the right anterior inferior cerebellar artery. Again there is no visualization of the left anterior inferior cerebellar artery. The right anterior inferior cerebellar artery appears to supply the right posterior-inferior cerebellar artery territory.  Inflow of non-opacified blood is seen in the basilar artery from the contralateral vertebral artery.  The left subclavian artery injection demonstrates normal opacification of the left subclavian artery and the left thyrocervical trunk.  IMPRESSION: Approximately 75% narrowing of the left internal carotid artery petrous cavernous junction. This finding is new when compared to the previous angiogram.  Approximately 55% narrowing  of the left internal carotid artery at the bulb by NASCET criteria. This has progressed compared to the previous arteriogram.  Approximately 80% intra stent stenoses at the right internal carotid artery petrous cavernous junction, with a 70% stenoses which is new of the supraclinoid right ICA.  Nonvisualization of the right posterior inferior cerebellar artery, and of the left anterior inferior cerebellar artery.  Approximately 50% narrowing of the right posterior cerebral artery distal P1 segment.   Electronically Signed   By: Julieanne Cotton M.D.   On: 08/11/2014 15:57    Labs:  CBC:  Recent Labs  08/09/14 2036 08/09/14 2203 08/10/14 0400 08/14/14 0610  WBC 9.8  --  7.3 6.0  HGB 12.5* 12.6* 11.2* 11.5*  HCT 36.6* 37.0* 33.5* 33.7*  PLT 193  --  153 132*  COAGS:  Recent Labs  08/09/14 2036 08/09/14 2148  INR 1.03  --   APTT  --  34    BMP:  Recent Labs  08/09/14 2036 08/09/14 2203 08/10/14 0400 08/11/14 0710 08/14/14 0610  NA 136 137  --  142 143  K 3.9 4.1  --  4.0 3.6  CL 100 98  --  109 107  CO2 23  --   --  28 28  GLUCOSE 330* 362*  --  161* 158*  BUN 38* 41*  --  22 7  CALCIUM 9.0  --   --  9.0 9.1  CREATININE 1.51* 1.50* 1.50* 1.00 0.94  GFRNONAA 49*  --  49* 80* 89*  GFRAA 56*  --  57* >90 >90    LIVER FUNCTION TESTS:  Recent Labs  08/09/14 2036  BILITOT 1.1  AST 44*  ALT 22  ALKPHOS 83  PROT 7.3  ALBUMIN 3.7    TUMOR MARKERS: No results for input(s): AFPTM, CEA, CA199, CHROMGRNA in the last 8760 hours.  Assessment and Plan:  Previous CVA R ICA pta/stent 10/2006 New intra stent and beyond stent stenosis per arteriogram 08/11/2014 Now scheduled for cerebral arteriogram with possible angioplasty/stent of Intra stent stenosis and supraclinoid area stenosis Pt is aware of procedure benefits and risks including but not l;ilited to: Infection, bleeding; vessel damage; stroke; death Agreeable to proceed Consent signed andin chart He  understands if intervention is performed he will be admitted overnight to Neuro ICU with plan to discharge home in am    Thank you for this interesting consult.  I greatly enjoyed meeting Clayton [name] and look forward to participating in their care.  Signed: Felma Pfefferle A 08/19/2014, 7:31 AM   I spent a total of 20 minutes face to face in clinical consultation, greater than 50% of which was counseling/coordinating care for cerebral arteriogram with R ICA pta/stent

## 2014-08-19 NOTE — Anesthesia Preprocedure Evaluation (Addendum)
Anesthesia Evaluation  Patient identified by MRN, date of birth, ID band Patient awake    Reviewed: Allergy & Precautions, NPO status , Patient's Chart, lab work & pertinent test results  History of Anesthesia Complications Negative for: history of anesthetic complications  Airway Mallampati: II  TM Distance: >3 FB Neck ROM: full  Mouth opening: Limited Mouth Opening  Dental  (+) Dental Advisory Given, Poor Dentition,    Pulmonary former smoker,          Cardiovascular hypertension, Pt. on home beta blockers and Pt. on medications + CAD and + Past MI     Neuro/Psych Pt has an intracranial stent.  There is also diffuse cerebrovascular arteriosclerosis. CVA    GI/Hepatic Neg liver ROS, GERD-  Medicated and Controlled,  Endo/Other  diabetes, Type 2obese  Renal/GU Renal InsufficiencyRenal disease     Musculoskeletal   Abdominal   Peds  Hematology   Anesthesia Other Findings   Reproductive/Obstetrics                           Anesthesia Physical Anesthesia Plan  ASA: III  Anesthesia Plan: General   Post-op Pain Management:    Induction: Intravenous  Airway Management Planned: Oral ETT  Additional Equipment: Arterial line  Intra-op Plan:   Post-operative Plan: Extubation in OR  Informed Consent: I have reviewed the patients History and Physical, chart, labs and discussed the procedure including the risks, benefits and alternatives for the proposed anesthesia with the patient or authorized representative who has indicated his/her understanding and acceptance.     Plan Discussed with: CRNA, Anesthesiologist and Surgeon  Anesthesia Plan Comments:         Anesthesia Quick Evaluation

## 2014-08-20 LAB — GLUCOSE, CAPILLARY: Glucose-Capillary: 152 mg/dL — ABNORMAL HIGH (ref 70–99)

## 2014-08-21 MED FILL — Perflutren Lipid Microsphere IV Susp 1.1 MG/ML: INTRAVENOUS | Qty: 10 | Status: AC

## 2014-09-05 ENCOUNTER — Telehealth (HOSPITAL_COMMUNITY): Payer: Self-pay | Admitting: Interventional Radiology

## 2014-09-05 NOTE — Telephone Encounter (Signed)
[place] [facility] to see if [place] had his P2Y12 study and get the results. Spoke to the RN and she states that he did have this study performed last week but that the patient was discharged home yesterday. She said she was busy and did not have time to get me those results but that I could just call the patient myself and get those results. I tried to call the patient's number but got no answer and no VM to leave a message. Will try him back again today. JM

## 2014-09-09 NOTE — Progress Notes (Signed)
Erron  

## 2014-09-10 ENCOUNTER — Encounter: Payer: Non-veteran care | Admitting: Cardiovascular Disease

## 2014-09-11 ENCOUNTER — Encounter: Payer: Self-pay | Admitting: Cardiovascular Disease

## 2015-01-16 ENCOUNTER — Telehealth (HOSPITAL_COMMUNITY): Payer: Self-pay | Admitting: Interventional Radiology

## 2015-01-16 NOTE — Telephone Encounter (Signed)
Have tried to contact pt multiple times with no answer and no voice mail to leave a message to schedule his angioplasty for stenosis. JM

## 2015-02-09 ENCOUNTER — Encounter: Payer: Self-pay | Admitting: Family

## 2015-02-11 ENCOUNTER — Encounter (HOSPITAL_COMMUNITY): Payer: Non-veteran care

## 2015-02-11 ENCOUNTER — Ambulatory Visit: Payer: Non-veteran care | Admitting: Family

## 2015-03-23 ENCOUNTER — Emergency Department (HOSPITAL_BASED_OUTPATIENT_CLINIC_OR_DEPARTMENT_OTHER): Payer: Medicare HMO

## 2015-03-23 ENCOUNTER — Encounter (HOSPITAL_BASED_OUTPATIENT_CLINIC_OR_DEPARTMENT_OTHER): Payer: Self-pay

## 2015-03-23 ENCOUNTER — Emergency Department (HOSPITAL_BASED_OUTPATIENT_CLINIC_OR_DEPARTMENT_OTHER)
Admission: EM | Admit: 2015-03-23 | Discharge: 2015-03-23 | Disposition: A | Discharge status: Home / Self Care | Payer: Medicare HMO | Attending: Emergency Medicine | Admitting: Emergency Medicine

## 2015-03-23 DIAGNOSIS — S80812A Abrasion, left lower leg, initial encounter: Secondary | ICD-10-CM | POA: Insufficient documentation

## 2015-03-23 DIAGNOSIS — Y9289 Other specified places as the place of occurrence of the external cause: Secondary | ICD-10-CM | POA: Insufficient documentation

## 2015-03-23 DIAGNOSIS — S80811A Abrasion, right lower leg, initial encounter: Secondary | ICD-10-CM | POA: Insufficient documentation

## 2015-03-23 DIAGNOSIS — R011 Cardiac murmur, unspecified: Secondary | ICD-10-CM | POA: Diagnosis not present

## 2015-03-23 DIAGNOSIS — Y998 Other external cause status: Secondary | ICD-10-CM | POA: Insufficient documentation

## 2015-03-23 DIAGNOSIS — Z794 Long term (current) use of insulin: Secondary | ICD-10-CM | POA: Insufficient documentation

## 2015-03-23 DIAGNOSIS — X58XXXA Exposure to other specified factors, initial encounter: Secondary | ICD-10-CM | POA: Diagnosis not present

## 2015-03-23 DIAGNOSIS — S40811A Abrasion of right upper arm, initial encounter: Secondary | ICD-10-CM | POA: Diagnosis not present

## 2015-03-23 DIAGNOSIS — J40 Bronchitis, not specified as acute or chronic: Secondary | ICD-10-CM

## 2015-03-23 DIAGNOSIS — S40812A Abrasion of left upper arm, initial encounter: Secondary | ICD-10-CM | POA: Diagnosis not present

## 2015-03-23 DIAGNOSIS — Z87891 Personal history of nicotine dependence: Secondary | ICD-10-CM | POA: Insufficient documentation

## 2015-03-23 DIAGNOSIS — E11319 Type 2 diabetes mellitus with unspecified diabetic retinopathy without macular edema: Secondary | ICD-10-CM | POA: Diagnosis not present

## 2015-03-23 DIAGNOSIS — J209 Acute bronchitis, unspecified: Secondary | ICD-10-CM | POA: Diagnosis not present

## 2015-03-23 DIAGNOSIS — I252 Old myocardial infarction: Secondary | ICD-10-CM | POA: Insufficient documentation

## 2015-03-23 DIAGNOSIS — Z79899 Other long term (current) drug therapy: Secondary | ICD-10-CM | POA: Insufficient documentation

## 2015-03-23 DIAGNOSIS — I10 Essential (primary) hypertension: Secondary | ICD-10-CM | POA: Insufficient documentation

## 2015-03-23 DIAGNOSIS — Z8673 Personal history of transient ischemic attack (TIA), and cerebral infarction without residual deficits: Secondary | ICD-10-CM | POA: Insufficient documentation

## 2015-03-23 DIAGNOSIS — E1140 Type 2 diabetes mellitus with diabetic neuropathy, unspecified: Secondary | ICD-10-CM | POA: Diagnosis not present

## 2015-03-23 DIAGNOSIS — Z7982 Long term (current) use of aspirin: Secondary | ICD-10-CM | POA: Diagnosis not present

## 2015-03-23 DIAGNOSIS — R197 Diarrhea, unspecified: Secondary | ICD-10-CM | POA: Diagnosis not present

## 2015-03-23 DIAGNOSIS — R05 Cough: Secondary | ICD-10-CM | POA: Diagnosis not present

## 2015-03-23 DIAGNOSIS — E785 Hyperlipidemia, unspecified: Secondary | ICD-10-CM | POA: Diagnosis not present

## 2015-03-23 DIAGNOSIS — Y9389 Activity, other specified: Secondary | ICD-10-CM | POA: Insufficient documentation

## 2015-03-23 DIAGNOSIS — I2510 Atherosclerotic heart disease of native coronary artery without angina pectoris: Secondary | ICD-10-CM | POA: Insufficient documentation

## 2015-03-23 DIAGNOSIS — Z8719 Personal history of other diseases of the digestive system: Secondary | ICD-10-CM | POA: Diagnosis not present

## 2015-03-23 DIAGNOSIS — R079 Chest pain, unspecified: Secondary | ICD-10-CM | POA: Diagnosis present

## 2015-03-23 LAB — BASIC METABOLIC PANEL
Anion gap: 9 (ref 5–15)
BUN: 18 mg/dL (ref 6–20)
CHLORIDE: 100 mmol/L — AB (ref 101–111)
CO2: 31 mmol/L (ref 22–32)
CREATININE: 1.02 mg/dL (ref 0.61–1.24)
Calcium: 8.9 mg/dL (ref 8.9–10.3)
GFR calc non Af Amer: >60 mL/min (ref >60)
Glucose, Bld: 169 mg/dL — ABNORMAL HIGH (ref 65–99)
Potassium: 3.7 mmol/L (ref 3.5–5.1)
Sodium: 140 mmol/L (ref 135–145)

## 2015-03-23 LAB — URINE MICROSCOPIC-ADD ON

## 2015-03-23 LAB — CBC WITH DIFFERENTIAL/PLATELET
Basophils Absolute: 0 10*3/uL (ref 0.0–0.1)
Basophils Relative: 0 % (ref 0–1)
Eosinophils Absolute: 0.1 10*3/uL (ref 0.0–0.7)
Eosinophils Relative: 1 % (ref 0–5)
HEMATOCRIT: 37.9 % — AB (ref 39.0–52.0)
HEMOGLOBIN: 12.8 g/dL — AB (ref 13.0–17.0)
LYMPHS ABS: 1.8 10*3/uL (ref 0.7–4.0)
LYMPHS PCT: 23 % (ref 12–46)
MCH: 30.3 pg (ref 26.0–34.0)
MCHC: 33.8 g/dL (ref 30.0–36.0)
MCV: 89.8 fL (ref 78.0–100.0)
MONOS PCT: 8 % (ref 3–12)
Monocytes Absolute: 0.6 10*3/uL (ref 0.1–1.0)
NEUTROS ABS: 5.3 10*3/uL (ref 1.7–7.7)
NEUTROS PCT: 68 % (ref 43–77)
Platelets: 190 10*3/uL (ref 150–400)
RBC: 4.22 MIL/uL (ref 4.22–5.81)
RDW: 16.4 % — ABNORMAL HIGH (ref 11.5–15.5)
WBC: 7.7 10*3/uL (ref 4.0–10.5)

## 2015-03-23 LAB — URINALYSIS, ROUTINE W REFLEX MICROSCOPIC
Bilirubin Urine: NEGATIVE
GLUCOSE, UA: 500 mg/dL — AB
KETONES UR: NEGATIVE mg/dL
Nitrite: NEGATIVE
PROTEIN: NEGATIVE mg/dL
Specific Gravity, Urine: 1.01 (ref 1.005–1.030)
Urobilinogen, UA: 0.2 mg/dL (ref 0.0–1.0)
pH: 5 (ref 5.0–8.0)

## 2015-03-23 LAB — BRAIN NATRIURETIC PEPTIDE: B NATRIURETIC PEPTIDE 5: 112.3 pg/mL — AB (ref 0.0–100.0)

## 2015-03-23 LAB — TROPONIN I: Troponin I: <0.03 ng/mL (ref <0.031)

## 2015-03-23 MED ORDER — DOXYCYCLINE HYCLATE 100 MG PO CAPS: 100.0000 mg | ORAL_CAPSULE | Freq: Two times a day (BID) | ORAL | Status: DC

## 2015-03-23 NOTE — ED Notes (Addendum)
Patient here with CP that he describes his chest hurts when he breathes and increased cough and congestion. Also complains of dysuria and frequency

## 2015-03-23 NOTE — ED Provider Notes (Signed)
CSN: [id number]     Arrival date & time 03/23/15  1507 History  This chart was scribed for Gwyneth Sprout, MD by Ronney Lion, ED Scribe. This patient was seen in room MH05/MH05 and the patient's care was started at 4:04 PM.    Chief Complaint  Patient presents with  . Chest Pain   Patient is a 59 y.o. male presenting with chest pain. The history is provided by the patient. No language interpreter was used.  Chest Pain Pain location:  Substernal area and L chest Pain radiates to:  Upper back and L shoulder Pain severity:  Moderate Onset quality:  Gradual Timing:  Intermittent Chronicity:  New Context comment:  Only occurs with cough or deep inspiration Relieved by:  None tried Worsened by:  Coughing and deep breathing Ineffective treatments:  None tried Associated symptoms: cough (productive with brown-green sputum)   Associated symptoms: no fever, no shortness of breath and not vomiting     HPI Comments: Clayton [name] is a 59 y.o. male who presents to the Emergency Department complaining of chest pain radiating through his left shoulder and back triggered only by deep inspiration and cough. He denies any pain with exertion or at rest. Patient notes a history of MI x 2 but states this feels different.   Patient has been having a productive cough with brown-green sputum over the past month, but he states he has not had the chest pain until recently. Patient had been evaluated for his symptoms over the past month by a nurse, who thought he might have walking pneumonia. Patient states he has been using his inhaler at home at night, although he has not used this today as he felt he did not need it today.  Patient notes he has been having diarrhea for the past month, which he has been treating with imodium. He denies any medication changes over the past month. He denies any recent antibiotic use.  Patient reports his blood sugar today is 195, which he states is good for him as it usually runs  from 200-400. He denies fever, vomiting, leg swelling, or SOB. He lives in a nursing senior care home, and states he has nursing care there for the abrasions on his extremities, which gets from bumping into furniture occasionally.   Past Medical History  Diagnosis Date  . Stroke   . Coronary artery disease   . Diabetes mellitus without complication   . Hypertension   . MI (myocardial infarction)   . Hyperlipidemia   . GERD (gastroesophageal reflux disease)   . Diabetic neuropathy   . Diabetic retinopathy    Past Surgical History  Procedure Laterality Date  . Below knee leg amputation Left   . Appendectomy    . Percutaneous placement intravascular stent cervical carotid artery Right    No family history on file. Social History  Substance Use Topics  . Smoking status: Former Games developer  . Smokeless tobacco: None  . Alcohol Use: No    Review of Systems  Constitutional: Negative for fever.  Respiratory: Positive for cough (productive with brown-green sputum). Negative for shortness of breath.   Cardiovascular: Positive for chest pain. Negative for leg swelling.  Gastrointestinal: Positive for diarrhea. Negative for vomiting.  Skin: Positive for wound (multiple abrasions on extremities).  All other systems reviewed and are negative.     Allergies  Niaspan  Home Medications   Prior to Admission medications   Medication Sig Start Date End Date Taking? Authorizing Provider  aspirin  325 MG tablet Take 1 tablet (325 mg total) by mouth daily. 08/15/14   Esperanza Sheets, MD  atorvastatin (LIPITOR) 40 MG tablet Take 40 mg by mouth daily.    Historical Provider, MD  cholecalciferol (VITAMIN D) 1000 UNITS tablet Take 2,000 Units by mouth 2 (two) times daily.    Historical Provider, MD  clopidogrel (PLAVIX) 75 MG tablet Take 1 tablet (75 mg total) by mouth daily. 08/15/14   Esperanza Sheets, MD  furosemide (LASIX) 40 MG tablet Take 1 tablet (40 mg total) by mouth daily as needed for  fluid or edema. 08/15/14   Esperanza Sheets, MD  gabapentin (NEURONTIN) 300 MG capsule Take 300 mg by mouth 4 (four) times daily.    Historical Provider, MD  HYDROcodone-acetaminophen (NORCO/VICODIN) 5-325 MG per tablet Take 1 tablet by mouth 3 (three) times daily as needed for moderate pain. 08/15/14   Esperanza Sheets, MD  insulin aspart (NOVOLOG) 100 UNIT/ML injection Inject 0-15 Units into the skin 3 (three) times daily with meals. 08/15/14   Esperanza Sheets, MD  insulin glargine (LANTUS) 100 UNIT/ML injection Inject 45 Units into the skin 2 (two) times daily.    Historical Provider, MD  lisinopril (PRINIVIL,ZESTRIL) 2.5 MG tablet Take 2.5 mg by mouth daily.    Historical Provider, MD  metoprolol (LOPRESSOR) 50 MG tablet Take 25 mg by mouth 2 (two) times daily.    Historical Provider, MD  Multiple Vitamin (MULTIVITAMIN WITH MINERALS) TABS tablet Take 1 tablet by mouth daily.    Historical Provider, MD   BP 106/56 mmHg  Pulse 85  Temp(Src) 98.2 F (36.8 C) (Oral)  Resp 20  Wt 240 lb (108.863 kg)  SpO2 97% Physical Exam  Constitutional: He is oriented to person, place, and time. He appears well-developed and well-nourished. No distress.  HENT:  Head: Normocephalic and atraumatic.  Poor dentition.   Eyes: Conjunctivae and EOM are normal.  Neck: Neck supple. No tracheal deviation present.  Cardiovascular: Normal rate.   Murmur heard. 2/6 systolic murmur, heard best at the left sternal border.  Pulmonary/Chest: Effort normal. No respiratory distress. He exhibits no tenderness.  Decreased breath sounds in the bases bilaterally. No wheezing.   Musculoskeletal: Normal range of motion. He exhibits edema.  Multiple healing abrasions at different stages over the upper and lower extremities. RLE has 2 wounds, with dressings in place. 1+ edema in RLE. BKA of LLE.  Neurological: He is alert and oriented to person, place, and time.  Skin: Skin is warm and dry.  Small, pointing abscess to right cheek  in beard.   Psychiatric: He has a normal mood and affect. His behavior is normal.  Nursing note and vitals reviewed.   ED Course  Procedures (including critical care time)  DIAGNOSTIC STUDIES: Oxygen Saturation is 97% on RA, normal by my interpretation.    COORDINATION OF CARE: 4:10 PM - Discussed treatment plan with pt at bedside which includes breathing treatment. Pt verbalized understanding and agreed to plan.    Labs Review Labs Reviewed  URINALYSIS, ROUTINE W REFLEX MICROSCOPIC (NOT AT [facility]) - Abnormal; Notable for the following:    Glucose, UA 500 (*)    Hgb urine dipstick TRACE (*)    Leukocytes, UA TRACE (*)    All other components within normal limits  CBC WITH DIFFERENTIAL/PLATELET - Abnormal; Notable for the following:    Hemoglobin 12.8 (*)    HCT 37.9 (*)    RDW 16.4 (*)    All  other components within normal limits  BASIC METABOLIC PANEL - Abnormal; Notable for the following:    Chloride 100 (*)    Glucose, Bld 169 (*)    All other components within normal limits  BRAIN NATRIURETIC PEPTIDE - Abnormal; Notable for the following:    B Natriuretic Peptide 112.3 (*)    All other components within normal limits  URINE MICROSCOPIC-ADD ON  TROPONIN I    Imaging Review Dg Chest 2 View  03/23/2015   CLINICAL DATA:  Cough, congestion, smoker  EXAM: CHEST  2 VIEW  COMPARISON:  08/09/2014  FINDINGS: Cardiomediastinal silhouette is stable. Status post median sternotomy. Study is limited by patient's large body habitus. No infiltrate or pulmonary edema. Mild basilar atelectasis. Mild degenerative changes thoracic spine.  IMPRESSION: Limited study by patient's large body habitus. Status post CABG. Mild basilar atelectasis. No infiltrate or pulmonary edema.   Electronically Signed   By: Natasha Mead M.D.   On: 03/23/2015 15:59   I have personally reviewed and evaluated these images and lab results as part of my medical decision-making.   EKG Interpretation   Date/Time:   Monday March 23 2015 15:16:48 EDT Ventricular Rate:  87 PR Interval:  242 QRS Duration: 96 QT Interval:  380 QTC Calculation: 457 R Axis:   6 Text Interpretation:  Sinus rhythm with 1st degree A-V block Possible Left  atrial enlargement No significant change since last tracing Confirmed by  [facility]  MD, Alphonzo Lemmings ([id number]) on 03/23/2015 3:24:45 PM      MDM   Final diagnoses:  Bronchitis   Patient here complaining of a productive cough for the last 1 month that has now developed additional chest pain with cough and deep breathing. The pain is only initiated by coughing and deep breathing and does not occur with exertion at rest. He denies any fever or shortness of breath. He has been using an inhaler at night for the cough. No vomiting, abdominal pain or change in his urinary habits. Blood sugar has been improved over the last few days measuring 199 today which he states his. Good for him. Patient is well-appearing on exam has decreased breath sounds bilaterally but no evidence of fluid overload. No wheezing currently in patient's satting 100% on room air and in no acute respiratory distress. Given the complaint of productive cough concern that patient may have bronchitis. Chest x-ray shows bibasilar atelectasis but no evidence of infiltrate or pulmonary edema. EKG is unchanged from prior. Low suspicion that this is ACS or cardiac in nature.  CBC, BMP, troponin and BNP pending  5:18 PM Labs are within normal limits. Given the sheer amount of time patient symptoms have been going on and will treat for bronchitis with doxycycline. Patient was encouraged to continue using his inhaler when necessary and follow-up with his physician if things do not improve.  I personally performed the services described in this documentation, which was scribed in my presence.  The recorded information has been reviewed and considered.     Gwyneth Sprout, MD 03/23/15 1720

## 2015-06-01 ENCOUNTER — Inpatient Hospital Stay (HOSPITAL_BASED_OUTPATIENT_CLINIC_OR_DEPARTMENT_OTHER)
Admission: EM | Admit: 2015-06-01 | Discharge: 2015-06-05 | DRG: 637 | Disposition: A | Discharge status: Home Health | Payer: Non-veteran care | Attending: Internal Medicine | Admitting: Internal Medicine

## 2015-06-01 ENCOUNTER — Inpatient Hospital Stay (HOSPITAL_COMMUNITY): Payer: Non-veteran care

## 2015-06-01 ENCOUNTER — Encounter (HOSPITAL_BASED_OUTPATIENT_CLINIC_OR_DEPARTMENT_OTHER): Payer: Self-pay | Admitting: *Deleted

## 2015-06-01 ENCOUNTER — Emergency Department (HOSPITAL_BASED_OUTPATIENT_CLINIC_OR_DEPARTMENT_OTHER): Payer: Non-veteran care

## 2015-06-01 DIAGNOSIS — R0602 Shortness of breath: Secondary | ICD-10-CM

## 2015-06-01 DIAGNOSIS — Z09 Encounter for follow-up examination after completed treatment for conditions other than malignant neoplasm: Secondary | ICD-10-CM

## 2015-06-01 DIAGNOSIS — I110 Hypertensive heart disease with heart failure: Secondary | ICD-10-CM | POA: Diagnosis present

## 2015-06-01 DIAGNOSIS — J189 Pneumonia, unspecified organism: Secondary | ICD-10-CM | POA: Diagnosis present

## 2015-06-01 DIAGNOSIS — I429 Cardiomyopathy, unspecified: Secondary | ICD-10-CM | POA: Diagnosis present

## 2015-06-01 DIAGNOSIS — I25119 Atherosclerotic heart disease of native coronary artery with unspecified angina pectoris: Secondary | ICD-10-CM | POA: Diagnosis not present

## 2015-06-01 DIAGNOSIS — Z79899 Other long term (current) drug therapy: Secondary | ICD-10-CM | POA: Diagnosis not present

## 2015-06-01 DIAGNOSIS — E11319 Type 2 diabetes mellitus with unspecified diabetic retinopathy without macular edema: Secondary | ICD-10-CM | POA: Diagnosis present

## 2015-06-01 DIAGNOSIS — Z7982 Long term (current) use of aspirin: Secondary | ICD-10-CM | POA: Diagnosis not present

## 2015-06-01 DIAGNOSIS — J9601 Acute respiratory failure with hypoxia: Secondary | ICD-10-CM | POA: Diagnosis present

## 2015-06-01 DIAGNOSIS — I4892 Unspecified atrial flutter: Secondary | ICD-10-CM | POA: Diagnosis present

## 2015-06-01 DIAGNOSIS — E785 Hyperlipidemia, unspecified: Secondary | ICD-10-CM | POA: Diagnosis present

## 2015-06-01 DIAGNOSIS — Z8673 Personal history of transient ischemic attack (TIA), and cerebral infarction without residual deficits: Secondary | ICD-10-CM

## 2015-06-01 DIAGNOSIS — E872 Acidosis: Secondary | ICD-10-CM | POA: Diagnosis present

## 2015-06-01 DIAGNOSIS — I483 Typical atrial flutter: Secondary | ICD-10-CM | POA: Diagnosis not present

## 2015-06-01 DIAGNOSIS — N179 Acute kidney failure, unspecified: Secondary | ICD-10-CM | POA: Diagnosis not present

## 2015-06-01 DIAGNOSIS — E1142 Type 2 diabetes mellitus with diabetic polyneuropathy: Secondary | ICD-10-CM | POA: Diagnosis present

## 2015-06-01 DIAGNOSIS — R0902 Hypoxemia: Secondary | ICD-10-CM

## 2015-06-01 DIAGNOSIS — I951 Orthostatic hypotension: Secondary | ICD-10-CM | POA: Diagnosis not present

## 2015-06-01 DIAGNOSIS — G894 Chronic pain syndrome: Secondary | ICD-10-CM | POA: Diagnosis present

## 2015-06-01 DIAGNOSIS — J40 Bronchitis, not specified as acute or chronic: Secondary | ICD-10-CM | POA: Diagnosis present

## 2015-06-01 DIAGNOSIS — Z888 Allergy status to other drugs, medicaments and biological substances status: Secondary | ICD-10-CM

## 2015-06-01 DIAGNOSIS — I2510 Atherosclerotic heart disease of native coronary artery without angina pectoris: Secondary | ICD-10-CM | POA: Diagnosis present

## 2015-06-01 DIAGNOSIS — Z89512 Acquired absence of left leg below knee: Secondary | ICD-10-CM

## 2015-06-01 DIAGNOSIS — E162 Hypoglycemia, unspecified: Secondary | ICD-10-CM | POA: Diagnosis present

## 2015-06-01 DIAGNOSIS — E11649 Type 2 diabetes mellitus with hypoglycemia without coma: Principal | ICD-10-CM | POA: Diagnosis present

## 2015-06-01 DIAGNOSIS — T83511A Infection and inflammatory reaction due to indwelling urethral catheter, initial encounter: Secondary | ICD-10-CM | POA: Diagnosis not present

## 2015-06-01 DIAGNOSIS — Z794 Long term (current) use of insulin: Secondary | ICD-10-CM

## 2015-06-01 DIAGNOSIS — K219 Gastro-esophageal reflux disease without esophagitis: Secondary | ICD-10-CM | POA: Diagnosis present

## 2015-06-01 DIAGNOSIS — I252 Old myocardial infarction: Secondary | ICD-10-CM

## 2015-06-01 DIAGNOSIS — I5033 Acute on chronic diastolic (congestive) heart failure: Secondary | ICD-10-CM | POA: Diagnosis present

## 2015-06-01 DIAGNOSIS — Z79891 Long term (current) use of opiate analgesic: Secondary | ICD-10-CM | POA: Diagnosis not present

## 2015-06-01 DIAGNOSIS — I6529 Occlusion and stenosis of unspecified carotid artery: Secondary | ICD-10-CM | POA: Diagnosis present

## 2015-06-01 DIAGNOSIS — I959 Hypotension, unspecified: Secondary | ICD-10-CM | POA: Diagnosis not present

## 2015-06-01 DIAGNOSIS — Z7902 Long term (current) use of antithrombotics/antiplatelets: Secondary | ICD-10-CM

## 2015-06-01 DIAGNOSIS — A419 Sepsis, unspecified organism: Secondary | ICD-10-CM | POA: Diagnosis not present

## 2015-06-01 LAB — I-STAT CG4 LACTIC ACID, ED: LACTIC ACID, VENOUS: 1.78 mmol/L (ref 0.5–2.0)

## 2015-06-01 LAB — URINALYSIS, ROUTINE W REFLEX MICROSCOPIC
Bilirubin Urine: NEGATIVE
Glucose, UA: NEGATIVE mg/dL
KETONES UR: NEGATIVE mg/dL
NITRITE: NEGATIVE
PH: 5 (ref 5.0–8.0)
Protein, ur: NEGATIVE mg/dL
SPECIFIC GRAVITY, URINE: 1.007 (ref 1.005–1.030)

## 2015-06-01 LAB — CBG MONITORING, ED
GLUCOSE-CAPILLARY: 184 mg/dL — AB (ref 65–99)
GLUCOSE-CAPILLARY: 62 mg/dL — AB (ref 65–99)
GLUCOSE-CAPILLARY: 149 mg/dL — AB (ref 65–99)
Glucose-Capillary: 26 mg/dL — CL (ref 65–99)
Glucose-Capillary: 123 mg/dL — ABNORMAL HIGH (ref 65–99)

## 2015-06-01 LAB — I-STAT ARTERIAL BLOOD GAS, ED
Acid-Base Excess: 1 mmol/L (ref 0.0–2.0)
Bicarbonate: 27.2 mEq/L — ABNORMAL HIGH (ref 20.0–24.0)
O2 SAT: 89 %
PCO2 ART: 49.1 mmHg — AB (ref 35.0–45.0)
PO2 ART: 59 mmHg — AB (ref 80.0–100.0)
Patient temperature: 98
TCO2: 29 mmol/L (ref 0–100)
pH, Arterial: 7.35 (ref 7.350–7.450)

## 2015-06-01 LAB — I-STAT VENOUS BLOOD GAS, ED
Acid-Base Excess: 3 mmol/L — ABNORMAL HIGH (ref 0.0–2.0)
BICARBONATE: 31.3 meq/L — AB (ref 20.0–24.0)
O2 SAT: 50 %
PH VEN: 7.285 (ref 7.250–7.300)
TCO2: 33 mmol/L (ref 0–100)
pCO2, Ven: 65.6 mmHg — ABNORMAL HIGH (ref 45.0–50.0)
pO2, Ven: 30 mmHg (ref 30.0–45.0)

## 2015-06-01 LAB — URINE MICROSCOPIC-ADD ON

## 2015-06-01 LAB — COMPREHENSIVE METABOLIC PANEL
ALBUMIN: 4 g/dL (ref 3.5–5.0)
ALK PHOS: 85 U/L (ref 38–126)
ALT: 14 U/L — ABNORMAL LOW (ref 17–63)
ANION GAP: 10 (ref 5–15)
AST: 19 U/L (ref 15–41)
BILIRUBIN TOTAL: 0.8 mg/dL (ref 0.3–1.2)
BUN: 28 mg/dL — ABNORMAL HIGH (ref 6–20)
CALCIUM: 9 mg/dL (ref 8.9–10.3)
CO2: 30 mmol/L (ref 22–32)
Chloride: 98 mmol/L — ABNORMAL LOW (ref 101–111)
Creatinine, Ser: 1.24 mg/dL (ref 0.61–1.24)
Glucose, Bld: 34 mg/dL — CL (ref 65–99)
POTASSIUM: 3.6 mmol/L (ref 3.5–5.1)
Sodium: 138 mmol/L (ref 135–145)
TOTAL PROTEIN: 8.4 g/dL — AB (ref 6.5–8.1)

## 2015-06-01 LAB — GLUCOSE, CAPILLARY: GLUCOSE-CAPILLARY: 179 mg/dL — AB (ref 65–99)

## 2015-06-01 LAB — CBC WITH DIFFERENTIAL/PLATELET
BASOS PCT: 0 %
Basophils Absolute: 0 10*3/uL (ref 0.0–0.1)
Eosinophils Absolute: 0.1 10*3/uL (ref 0.0–0.7)
Eosinophils Relative: 1 %
HEMATOCRIT: 39.1 % (ref 39.0–52.0)
Hemoglobin: 12.5 g/dL — ABNORMAL LOW (ref 13.0–17.0)
LYMPHS ABS: 1.9 10*3/uL (ref 0.7–4.0)
Lymphocytes Relative: 15 %
MCH: 28.5 pg (ref 26.0–34.0)
MCHC: 32 g/dL (ref 30.0–36.0)
MCV: 89.1 fL (ref 78.0–100.0)
MONO ABS: 0.9 10*3/uL (ref 0.1–1.0)
MONOS PCT: 7 %
NEUTROS ABS: 10 10*3/uL — AB (ref 1.7–7.7)
Neutrophils Relative %: 77 %
Platelets: 237 10*3/uL (ref 150–400)
RBC: 4.39 MIL/uL (ref 4.22–5.81)
RDW: 16.2 % — AB (ref 11.5–15.5)
WBC: 12.9 10*3/uL — ABNORMAL HIGH (ref 4.0–10.5)

## 2015-06-01 LAB — TROPONIN I: Troponin I: <0.03 ng/mL (ref <0.031)

## 2015-06-01 LAB — BRAIN NATRIURETIC PEPTIDE: B Natriuretic Peptide: 58.5 pg/mL (ref 0.0–100.0)

## 2015-06-01 LAB — MRSA PCR SCREENING: MRSA by PCR: NEGATIVE

## 2015-06-01 LAB — MAGNESIUM: Magnesium: 1.4 mg/dL — ABNORMAL LOW (ref 1.7–2.4)

## 2015-06-01 MED ORDER — FUROSEMIDE 40 MG PO TABS: 40.0000 mg | ORAL_TABLET | Freq: Every day | ORAL | Status: DC | PRN

## 2015-06-01 MED ORDER — VITAMIN D 1000 UNITS PO TABS
2000.0000 [IU] | ORAL_TABLET | Freq: Two times a day (BID) | ORAL | Status: DC
  Administered 2015-06-01 – 2015-06-05 (×8): 2000 [IU] via ORAL
  Filled 2015-06-01 (×8): qty 2

## 2015-06-01 MED ORDER — SODIUM CHLORIDE 0.9 % IJ SOLN
3.0000 mL | Freq: Two times a day (BID) | INTRAMUSCULAR | Status: DC
  Administered 2015-06-02 – 2015-06-05 (×6): 3 mL via INTRAVENOUS

## 2015-06-01 MED ORDER — ACETAMINOPHEN 650 MG RE SUPP: 650.0000 mg | Freq: Four times a day (QID) | RECTAL | Status: DC | PRN

## 2015-06-01 MED ORDER — ALUM & MAG HYDROXIDE-SIMETH 200-200-20 MG/5ML PO SUSP: 30.0000 mL | Freq: Four times a day (QID) | ORAL | Status: DC | PRN

## 2015-06-01 MED ORDER — ENOXAPARIN SODIUM 40 MG/0.4ML ~~LOC~~ SOLN
40.0000 mg | SUBCUTANEOUS | Status: DC
Start: 2015-06-01 — End: 2015-06-02
  Administered 2015-06-01 – 2015-06-02 (×2): 40 mg via SUBCUTANEOUS
  Filled 2015-06-01 (×2): qty 0.4

## 2015-06-01 MED ORDER — TRIAMCINOLONE ACETONIDE 0.1 % EX CREA
TOPICAL_CREAM | Freq: Four times a day (QID) | CUTANEOUS | Status: DC
  Administered 2015-06-01: 23:00:00 via TOPICAL
  Administered 2015-06-02 (×3): 1 via TOPICAL
  Administered 2015-06-02 – 2015-06-03 (×2): via TOPICAL
  Administered 2015-06-03: 1 via TOPICAL
  Administered 2015-06-03 – 2015-06-05 (×5): via TOPICAL
  Filled 2015-06-01 (×2): qty 15

## 2015-06-01 MED ORDER — ATORVASTATIN CALCIUM 40 MG PO TABS
40.0000 mg | ORAL_TABLET | Freq: Every day | ORAL | Status: DC
  Administered 2015-06-01 – 2015-06-05 (×5): 40 mg via ORAL
  Filled 2015-06-01 (×5): qty 1

## 2015-06-01 MED ORDER — IPRATROPIUM-ALBUTEROL 0.5-2.5 (3) MG/3ML IN SOLN
3.0000 mL | Freq: Four times a day (QID) | RESPIRATORY_TRACT | Status: DC
  Administered 2015-06-01 (×2): 3 mL via RESPIRATORY_TRACT
  Filled 2015-06-01 (×2): qty 3

## 2015-06-01 MED ORDER — CLOPIDOGREL BISULFATE 75 MG PO TABS
75.0000 mg | ORAL_TABLET | Freq: Every day | ORAL | Status: DC
  Administered 2015-06-01 – 2015-06-02 (×2): 75 mg via ORAL
  Filled 2015-06-01 (×2): qty 1

## 2015-06-01 MED ORDER — CETYLPYRIDINIUM CHLORIDE 0.05 % MT LIQD
7.0000 mL | Freq: Two times a day (BID) | OROMUCOSAL | Status: DC
  Administered 2015-06-01 – 2015-06-05 (×4): 7 mL via OROMUCOSAL

## 2015-06-01 MED ORDER — FUROSEMIDE 10 MG/ML IJ SOLN
40.0000 mg | Freq: Two times a day (BID) | INTRAMUSCULAR | Status: DC
  Administered 2015-06-02 – 2015-06-05 (×8): 40 mg via INTRAVENOUS
  Filled 2015-06-01 (×8): qty 4

## 2015-06-01 MED ORDER — ASPIRIN 325 MG PO TABS
325.0000 mg | ORAL_TABLET | Freq: Every day | ORAL | Status: DC
  Administered 2015-06-01 – 2015-06-02 (×2): 325 mg via ORAL
  Filled 2015-06-01 (×2): qty 1

## 2015-06-01 MED ORDER — DEXTROSE 50 % IV SOLN
INTRAVENOUS | Status: AC
  Administered 2015-06-01: 50 mL via INTRAVENOUS
  Filled 2015-06-01: qty 50

## 2015-06-01 MED ORDER — SENNOSIDES-DOCUSATE SODIUM 8.6-50 MG PO TABS: 1.0000 | ORAL_TABLET | Freq: Every evening | ORAL | Status: DC | PRN

## 2015-06-01 MED ORDER — DEXTROSE 10 % IV SOLN
INTRAVENOUS | Status: DC
  Administered 2015-06-01: 14:00:00 via INTRAVENOUS
  Filled 2015-06-01: qty 1000

## 2015-06-01 MED ORDER — FUROSEMIDE 10 MG/ML IJ SOLN
40.0000 mg | Freq: Once | INTRAMUSCULAR | Status: AC
  Administered 2015-06-01: 40 mg via INTRAVENOUS
  Filled 2015-06-01: qty 4

## 2015-06-01 MED ORDER — DEXTROSE 50 % IV SOLN
1.0000 | Freq: Once | INTRAVENOUS | Status: AC
Start: 2015-06-01 — End: 2015-06-01
  Administered 2015-06-01: 50 mL via INTRAVENOUS

## 2015-06-01 MED ORDER — METOPROLOL TARTRATE 25 MG PO TABS
25.0000 mg | ORAL_TABLET | Freq: Two times a day (BID) | ORAL | Status: DC
  Administered 2015-06-01 – 2015-06-04 (×6): 25 mg via ORAL
  Filled 2015-06-01 (×8): qty 1

## 2015-06-01 MED ORDER — SODIUM CHLORIDE 0.9 % IV BOLUS (SEPSIS)
1000.0000 mL | Freq: Once | INTRAVENOUS | Status: AC
  Administered 2015-06-01: 1000 mL via INTRAVENOUS

## 2015-06-01 MED ORDER — ACETAMINOPHEN 325 MG PO TABS: 650.0000 mg | ORAL_TABLET | Freq: Four times a day (QID) | ORAL | Status: DC | PRN

## 2015-06-01 MED ORDER — LEVALBUTEROL HCL 0.63 MG/3ML IN NEBU: 0.6300 mg | INHALATION_SOLUTION | Freq: Four times a day (QID) | RESPIRATORY_TRACT | Status: DC | PRN

## 2015-06-01 MED ORDER — HYDROCODONE-ACETAMINOPHEN 5-325 MG PO TABS
1.0000 | ORAL_TABLET | Freq: Three times a day (TID) | ORAL | Status: DC | PRN
  Administered 2015-06-01 – 2015-06-02 (×2): 1 via ORAL
  Filled 2015-06-01 (×2): qty 1

## 2015-06-01 MED ORDER — GABAPENTIN 300 MG PO CAPS
300.0000 mg | ORAL_CAPSULE | Freq: Four times a day (QID) | ORAL | Status: DC
  Administered 2015-06-01 – 2015-06-03 (×6): 300 mg via ORAL
  Filled 2015-06-01 (×6): qty 1

## 2015-06-01 MED ORDER — IPRATROPIUM-ALBUTEROL 0.5-2.5 (3) MG/3ML IN SOLN
3.0000 mL | Freq: Three times a day (TID) | RESPIRATORY_TRACT | Status: DC
  Administered 2015-06-02 – 2015-06-04 (×7): 3 mL via RESPIRATORY_TRACT
  Filled 2015-06-01 (×7): qty 3

## 2015-06-01 MED ORDER — ONDANSETRON HCL 4 MG/2ML IJ SOLN: 4.0000 mg | Freq: Four times a day (QID) | INTRAMUSCULAR | Status: DC | PRN

## 2015-06-01 MED ORDER — INSULIN ASPART 100 UNIT/ML ~~LOC~~ SOLN
0.0000 [IU] | Freq: Three times a day (TID) | SUBCUTANEOUS | Status: DC
  Administered 2015-06-02: 2 [IU] via SUBCUTANEOUS
  Administered 2015-06-02: 3 [IU] via SUBCUTANEOUS

## 2015-06-01 MED ORDER — ONDANSETRON HCL 4 MG PO TABS: 4.0000 mg | ORAL_TABLET | Freq: Four times a day (QID) | ORAL | Status: DC | PRN

## 2015-06-01 MED ORDER — DEXTROSE 50 % IV SOLN
1.0000 | Freq: Once | INTRAVENOUS | Status: AC
  Administered 2015-06-01: 50 mL via INTRAVENOUS

## 2015-06-01 NOTE — ED Notes (Signed)
Crystal, rt notified of md request for bipap, at bedside to assess pt. And draw abg.

## 2015-06-01 NOTE — ED Notes (Addendum)
The cbg result showing at 14:14 was collected at 12:29.

## 2015-06-01 NOTE — Progress Notes (Signed)
[facility] admit due to recurrent hypoglycemia, poor oral intake, hypoxia, on ventimask, chronic co2 retention at baseline. Productive cough, cxr ? Mild congestion? Per DEP patient clinically dry, s/p nebs,  ED started him on d10 drip, accepted to stepdown.  H/o bilateral BKA, from SNF.   Please call flow manager at [phone] upon patient's arrival.

## 2015-06-01 NOTE — ED Notes (Signed)
[facility]

## 2015-06-01 NOTE — H&P (Signed)
[facility] History and Physical  BRYSTON COLOCHO XBJ:[id number] DOB: 20-May-1956 DOA: 06/01/2015  Referring physician: * PCP: [name] [name], PA-C   Chief Complaint:  Hypoglycemia  HPI:  59 year old male  with past medical history of hypertension, hyperlipidemia, CVA, status post intracranial stent, coronary artery disease, better for vascular disease with stenting of the right femoral artery, diabetes mellitus, peripheral neuropathy, left BKA, right transmetatarsal amputation, who presented to [facility] via  EMS.Patient is from SNF, where he was found to be hypoglycemic , hypoglycemia not improved with glucose. Fingerstick 1 on the scene was 54. Patient remained hypoglycemic after receiving D50 enroute . In the ED the patient's CBG was 26, patient started on D10, patient apparently has had poor oral intake, slightly hypoxemic at 87% upon arrival, placed on Ventimask at 40% FiO2, subsequently switched to 2 L by nasal cannula. Because of the patient's respiratory status the patient was accepted to a stepdown bed at [facility]. Workup in the ER, ABG 7.35, CO2 49, PO2 59, creatinine 1.24, anion gap 10 White blood cell count 12.9, lactic acid normal, UA negative, cardiomegaly with pulmonary vascular congestion on chest x-ray Lasix Lasix      Review of Systems: negative for the following  Review of Systems:  Constitutional: Negative for fever, chills and malaise/fatigue. Negative for diaphoresis.  HENT: Negative for hearing loss, ear pain, nosebleeds, congestion Eyes: Negative for blurred vision, double vision, photophobia Respiratory: Negative for cough, hemoptysis, sputum production, shortness of breath  Cardiovascular: + for chronic leg swelling, changes of venous stasis Negative for chest pain, palpitations, orthopnea Gastrointestinal: Negative for nausea, vomiting and abdominal pain. Negative for heartburn, constipation, blood in stool and melena.  Genitourinary: Negative for  dysuria, urgency, frequency Musculoskeletal:   Negative for myalgias, back pain, joint pain and falls.  Skin: Negative for itching and rash.  Neurological: + for subjective weakness in all four extremities.   Negative for dizziness, tingling, tremors, sensory change, focal weakness, loss of consciousness and headaches.  Psychiatric/Behavioral: The patient is not nervous/anxious.      Past Medical History  Diagnosis Date  . Stroke (HCC)   . Coronary artery disease   . Diabetes mellitus without complication (HCC)   . Hypertension   . MI (myocardial infarction) (HCC)   . Hyperlipidemia   . GERD (gastroesophageal reflux disease)   . Diabetic neuropathy (HCC)   . Diabetic retinopathy [facility])      Past Surgical History  Procedure Laterality Date  . Below knee leg amputation Left   . Appendectomy    . Percutaneous placement intravascular stent cervical carotid artery Right       Social History:  reports that he has quit smoking. He does not have any smokeless tobacco history on file. He reports that he does not drink alcohol. His drug history is not on file.    Allergies  Allergen Reactions  . Niaspan [Niacin Er] Other (See Comments)    Face was red and hot      Family history negative for malignancy, asthma, diabetes, other hereditary disorders     Prior to Admission medications   Medication Sig Start Date End Date Taking? Authorizing Provider  aspirin 325 MG tablet Take 1 tablet (325 mg total) by mouth daily. 08/15/14   Esperanza Sheets, MD  atorvastatin (LIPITOR) 40 MG tablet Take 40 mg by mouth daily.    Historical Provider, MD  cholecalciferol (VITAMIN D) 1000 UNITS tablet Take 2,000 Units by mouth 2 (two) times daily.  Historical Provider, MD  clopidogrel (PLAVIX) 75 MG tablet Take 1 tablet (75 mg total) by mouth daily. 08/15/14   Esperanza Sheets, MD  doxycycline (VIBRAMYCIN) 100 MG capsule Take 1 capsule (100 mg total) by mouth 2 (two) times daily. 03/23/15    Gwyneth Sprout, MD  furosemide (LASIX) 40 MG tablet Take 1 tablet (40 mg total) by mouth daily as needed for fluid or edema. 08/15/14   Esperanza Sheets, MD  gabapentin (NEURONTIN) 300 MG capsule Take 300 mg by mouth 4 (four) times daily.    Historical Provider, MD  HYDROcodone-acetaminophen (NORCO/VICODIN) 5-325 MG per tablet Take 1 tablet by mouth 3 (three) times daily as needed for moderate pain. 08/15/14   Esperanza Sheets, MD  insulin aspart (NOVOLOG) 100 UNIT/ML injection Inject 0-15 Units into the skin 3 (three) times daily with meals. 08/15/14   Esperanza Sheets, MD  insulin glargine (LANTUS) 100 UNIT/ML injection Inject 45 Units into the skin 2 (two) times daily.    Historical Provider, MD  lisinopril (PRINIVIL,ZESTRIL) 2.5 MG tablet Take 2.5 mg by mouth daily.    Historical Provider, MD  metoprolol (LOPRESSOR) 50 MG tablet Take 25 mg by mouth 2 (two) times daily.    Historical Provider, MD  Multiple Vitamin (MULTIVITAMIN WITH MINERALS) TABS tablet Take 1 tablet by mouth daily.    Historical Provider, MD     Physical Exam: Filed Vitals:   06/01/15 1500 06/01/15 1515 06/01/15 1530 06/01/15 1700  BP: 106/67 107/74 104/71 145/73  Pulse: 69 66 50 83  Temp:    98 F (36.7 C)  TempSrc:    Oral  Resp: SpO2: 100% 100% 97% 100%     Constitutional: Vital signs reviewed. Patient is a well-developed and well-nourished in no acute distress and cooperative with exam. Alert and oriented x3.  Head: Normocephalic and atraumatic  Ear: TM normal bilaterally  Mouth: no erythema or exudates, MMM  Eyes: PERRL, EOMI, conjunctivae normal, No scleral icterus.  Neck: Supple, Trachea midline normal ROM, No JVD, mass, thyromegaly, or carotid bruit present.  Cardiovascular: RRR, S1 normal, S2 normal, no MRG, pulses symmetric and intact bilaterally  Pulmonary/Chest: CTAB, no wheezes, rales, or rhonchi  Abdominal: Soft. Non-tender, non-distended, bowel sounds are normal, no masses, organomegaly,  or guarding present.  GU: no CVA tenderness Musculoskeletal:   BKA on the left, right metatarsal amputation, Ext: no edema and no cyanosis, pulses palpable bilaterally (DP and PT)  Hematology: no cervical, inginal, or axillary adenopathy.  Neurological: A&O x3, Strenght is normal and symmetric bilaterally, cranial nerve II-XII are grossly intact, no focal motor deficit, sensory intact to light touch bilaterally.  Skin: Warm, dry and intact. No rash, cyanosis, or clubbing.  Psychiatric: Normal mood and affect. speech and behavior is normal. Judgment and thought content normal. Cognition and memory are normal.      Data Review   Micro Results No results found for this or any previous visit (from the past 240 hour(s)).  Radiology Reports Dg Chest 2 View  06/01/2015  CLINICAL DATA:  59 year old male with cough, congestion and hypoglycemia EXAM: CHEST  2 VIEW COMPARISON:  Prior chest x-ray 03/23/2015 FINDINGS: Stable cardiomegaly. Patient is status post median sternotomy with evidence of prior multivessel CABG. Mild pulmonary vascular congestion without overt edema is similar compared to prior imaging. No focal airspace consolidation, pleural effusion or pneumothorax. No suspicious pulmonary nodule or mass. No acute osseous abnormality. Chronic bronchitic changes are also similar compared to  prior. IMPRESSION: 1. Stable cardiomegaly and mild vascular congestion without overt edema. 2. No acute cardiopulmonary process. Electronically Signed   By: Heath  McCullough M.D.   On: 06/01/2015 13:18     CBC  [facility] Lab 06/01/15 1235  WBC 12.9*  HGB 12.5*  HCT 39.1  PLT 237  MCV 89.1  MCH 28.5  MCHC 32.0  RDW 16.2*  LYMPHSABS 1.9  MONOABS 0.9  EOSABS 0.1  BASOSABS 0.0    Chemistries   Recent Labs Lab 06/01/15 1235  NA 138  K 3.6  CL 98*  CO2 30  GLUCOSE 34*  BUN 28*  CREATININE 1.24  CALCIUM 9.0  AST 19  ALT 14*  ALKPHOS 85  BILITOT 0.8    ------------------------------------------------------------------------------------------------------------------ CrCl cannot be calculated (Unknown ideal weight.). ------------------------------------------------------------------------------------------------------------------ No results for input(s): HGBA1C in the last 72 hours. ------------------------------------------------------------------------------------------------------------------ No results for input(s): CHOL, HDL, LDLCALC, TRIG, CHOLHDL, LDLDIRECT in the last 72 hours. ------------------------------------------------------------------------------------------------------------------ No results for input(s): TSH, T4TOTAL, T3FREE, THYROIDAB in the last 72 hours.  Invalid input(s): FREET3 ------------------------------------------------------------------------------------------------------------------ No results for input(s): VITAMINB12, FOLATE, FERRITIN, TIBC, IRON, RETICCTPCT in the last 72 hours.  Coagulation profile No results for input(s): INR, PROTIME in the last 168 hours.  No results for input(s): DDIMER in the last 72 hours.  Cardiac Enzymes No results for input(s): CKMB, TROPONINI, MYOGLOBIN in the last 168 hours.  Invalid input(s): CK ------------------------------------------------------------------------------------------------------------------ Invalid input(s): POCBNP   CBG:  Recent Labs Lab 06/01/15 1229 06/01/15 1237 06/01/15 1344 06/01/15 1432 06/01/15 1600  GLUCAP 26* 184* 62* 149* 123*       EKG: Independently reviewed. *   Assessment/Plan Principal Problem: Acute hypoxemic respiratory failure-history of diastolic heart failure, not on a diuretic, chest x-ray shows mild pulmonary vascular congestion, will give 1 dose of Lasix, continue oxygen per protocol, did not see any need for BiPAP tonight     Hypoglycemia  Unclear etiology , continue D10, no signs of infection at this time,  hold Lantus, it CBGs remained greater than 200, discontinue D 10.     CAD (coronary artery disease)-continue beta blocker, aspirin, continue to cycle cardiac enzymes, slightly short of breath upon arrival likely secondary to pulmonary vascular congestion  Acute on chronic diastolic heart failure, 2-D echo 08/11/14, EF 55-60%, will give 1 dose of Lasix, the setting of hypoxemia    DM type 2 with diabetic peripheral neuropathy (HCC)-hold Lantus, started on SSI, Accu-Cheks, hemoglobin A1c    GERD (gastroesophageal reflux disease)-continue PPI     Code Status:   full Family Communication: bedside Disposition Plan: admit   Total time spent 55 minutes.Greater than 50% of this time was spent in counseling, explanation of diagnosis, planning of further management, and coordination of care  [facility],Gaia Gullikson [facility] Pager [phone]  If 7PM-7AM, please contact night-coverage [email or web address] Password [email or web address] 06/01/2015, 5:38 PM

## 2015-06-01 NOTE — ED Notes (Signed)
CBG is 6126 Amy RN aware.

## 2015-06-01 NOTE — ED Notes (Addendum)
Pt to room 14 by ems. Pt is awake and alert, oriented to baseline per ems. Ems called to [facility] for ams, staff gave peaches, ems gave oral glucose, fsbs on scene 54. Unable to obtain iv access en route. Pt assisted to stretcher x 4 assist. fsbs for us, 26. d50 administered by this rn.

## 2015-06-01 NOTE — ED Provider Notes (Addendum)
CSN: [id number]     Arrival date & time 06/01/15  1235 History   First MD Initiated Contact with Patient 06/01/15 1237     Chief Complaint  Patient presents with  . Hypoglycemia     (Consider location/radiation/quality/duration/timing/severity/associated sxs/prior Treatment) HPI 59 year old male who presents with hypoglycemia. History of CVA, CAD, DM on insulin, and b/l BKA. Presents from nursing facility where he was found to be altered today by staff. They report he has had significant decline in PO intake recently without change to medications. EMS called and noted to have POCT glucose 54 on scene. Given oral glucose and peaches. On arrival to ED POCT glucose 26 and given D50 25g on arrival with normalization of mental status. States he has had congestion with productive cough recently. Denies dyspnea, fever, chest pain, N/V, abdominal pain, diarrhea, or urinary complaints.  Past Medical History  Diagnosis Date  . Stroke (HCC)   . Coronary artery disease   . Diabetes mellitus without complication (HCC)   . Hypertension   . MI (myocardial infarction) (HCC)   . Hyperlipidemia   . GERD (gastroesophageal reflux disease)   . Diabetic neuropathy (HCC)   . Diabetic retinopathy [facility])    Past Surgical History  Procedure Laterality Date  . Below knee leg amputation Left   . Appendectomy    . Percutaneous placement intravascular stent cervical carotid artery Right    Family history reviewed. Non-contributory.  Social History  Substance Use Topics  . Smoking status: Former Games developer  . Smokeless tobacco: None  . Alcohol Use: No    Review of Systems 10/14 systems reviewed and are negative other than those stated in the HPI   Allergies  Niaspan  Home Medications   Prior to Admission medications   Medication Sig Start Date End Date Taking? Authorizing Provider  albuterol (PROVENTIL HFA;VENTOLIN HFA) 108 (90 BASE) MCG/ACT inhaler Inhale 1-2 puffs into the lungs every 6 (six) hours  as needed for wheezing or shortness of breath.   Yes Historical Provider, MD  aspirin EC 81 MG tablet Take 162 mg by mouth daily.   Yes Historical Provider, MD  atorvastatin (LIPITOR) 40 MG tablet Take 40 mg by mouth daily.   Yes Historical Provider, MD  cholecalciferol (VITAMIN D) 1000 UNITS tablet Take 2,000 Units by mouth 2 (two) times daily.   Yes Historical Provider, MD  clopidogrel (PLAVIX) 75 MG tablet Take 1 tablet (75 mg total) by mouth daily. Patient taking differently: Take 112.5 mg by mouth daily.  08/15/14  Yes Esperanza Sheets, MD  furosemide (LASIX) 40 MG tablet Take 1 tablet (40 mg total) by mouth daily as needed for fluid or edema. Patient taking differently: Take 40 mg by mouth daily.  08/15/14  Yes Esperanza Sheets, MD  gabapentin (NEURONTIN) 600 MG tablet Take 1,200 mg by mouth 4 (four) times daily.   Yes Historical Provider, MD  HYDROcodone-acetaminophen (NORCO/VICODIN) 5-325 MG per tablet Take 1 tablet by mouth 3 (three) times daily as needed for moderate pain. 08/15/14  Yes Esperanza Sheets, MD  insulin aspart (NOVOLOG) 100 UNIT/ML injection Inject 0-15 Units into the skin 3 (three) times daily with meals. Patient taking differently: Inject 0-20 Units into the skin 3 (three) times daily with meals as needed for high blood sugar. 15 units in the am, 20 at lunch, and 15 units at dinner, pt states is below 200 will not take any 08/15/14  Yes Esperanza Sheets, MD  insulin glargine (LANTUS) 100  UNIT/ML injection Inject 45 Units into the skin 2 (two) times daily.   Yes Historical Provider, MD  lisinopril (PRINIVIL,ZESTRIL) 2.5 MG tablet Take 2.5 mg by mouth daily.   Yes Historical Provider, MD  metFORMIN (GLUCOPHAGE) 1000 MG tablet Take 1,000 mg by mouth 2 (two) times daily with a meal.   Yes Historical Provider, MD  metoprolol (LOPRESSOR) 50 MG tablet Take 25 mg by mouth 2 (two) times daily.   Yes Historical Provider, MD  Multiple Vitamin (MULTIVITAMIN WITH MINERALS) TABS tablet Take 1  tablet by mouth daily.   Yes Historical Provider, MD  doxycycline (VIBRAMYCIN) 100 MG capsule Take 1 capsule (100 mg total) by mouth 2 (two) times daily. Patient not taking: Reported on 06/01/2015 03/23/15   Gwyneth SproutWhitney Plunkett, MD   BP 145/73 mmHg  Pulse 76  Temp(Src) 98.6 F (37 C) (Oral)  Resp 16  Ht 5\' 8"  (1.727 m)  Wt 240 lb 4.8 oz (109 kg)  BMI 36.55 kg/m2  SpO2 100% Physical Exam  ED Course  Procedures (including critical care time) Labs Review Labs Reviewed  CBC WITH DIFFERENTIAL/PLATELET - Abnormal; Notable for the following:    WBC 12.9 (*)    Hemoglobin 12.5 (*)    RDW 16.2 (*)    Neutro Abs 10.0 (*)    All other components within normal limits  COMPREHENSIVE METABOLIC PANEL - Abnormal; Notable for the following:    Chloride 98 (*)    Glucose, Bld 34 (*)    BUN 28 (*)    Total Protein 8.4 (*)    ALT 14 (*)    All other components within normal limits  URINALYSIS, ROUTINE W REFLEX MICROSCOPIC (NOT AT [facility]) - Abnormal; Notable for the following:    Hgb urine dipstick LARGE (*)    Leukocytes, UA SMALL (*)    All other components within normal limits  URINE MICROSCOPIC-ADD ON - Abnormal; Notable for the following:    Squamous Epithelial / LPF 0-5 (*)    Bacteria, UA RARE (*)    All other components within normal limits  MAGNESIUM - Abnormal; Notable for the following:    Magnesium 1.4 (*)    All other components within normal limits  CBG MONITORING, ED - Abnormal; Notable for the following:    Glucose-Capillary 184 (*)    All other components within normal limits  CBG MONITORING, ED - Abnormal; Notable for the following:    Glucose-Capillary 62 (*)    All other components within normal limits  I-STAT VENOUS BLOOD GAS, ED - Abnormal; Notable for the following:    pCO2, Ven 65.6 (*)    Bicarbonate 31.3 (*)    Acid-Base Excess 3.0 (*)    All other components within normal limits  CBG MONITORING, ED - Abnormal; Notable for the following:    Glucose-Capillary 26  (*)    All other components within normal limits  I-STAT ARTERIAL BLOOD GAS, ED - Abnormal; Notable for the following:    pCO2 arterial 49.1 (*)    pO2, Arterial 59.0 (*)    Bicarbonate 27.2 (*)    All other components within normal limits  CBG MONITORING, ED - Abnormal; Notable for the following:    Glucose-Capillary 149 (*)    All other components within normal limits  CBG MONITORING, ED - Abnormal; Notable for the following:    Glucose-Capillary 123 (*)    All other components within normal limits  MRSA PCR SCREENING  BRAIN NATRIURETIC PEPTIDE  TROPONIN I  BLOOD GAS,  VENOUS  TROPONIN I  HEMOGLOBIN A1C  I-STAT CG4 LACTIC ACID, ED    Imaging Review Dg Chest 2 View  06/01/2015  CLINICAL DATA:  60 year old male with cough, congestion and hypoglycemia EXAM: CHEST  2 VIEW COMPARISON:  Prior chest x-ray 03/23/2015 FINDINGS: Stable cardiomegaly. Patient is status post median sternotomy with evidence of prior multivessel CABG. Mild pulmonary vascular congestion without overt edema is similar compared to prior imaging. No focal airspace consolidation, pleural effusion or pneumothorax. No suspicious pulmonary nodule or mass. No acute osseous abnormality. Chronic bronchitic changes are also similar compared to prior. IMPRESSION: 1. Stable cardiomegaly and mild vascular congestion without overt edema. 2. No acute cardiopulmonary process. Electronically Signed   By: Malachy Moan M.D.   On: 06/01/2015 13:18   Dg Chest Port 1 View  06/01/2015  CLINICAL DATA:  59 year old male with hypoglycemia, confusion and productive cough EXAM: PORTABLE CHEST 1 VIEW COMPARISON:  Prior chest x-ray obtained earlier today FINDINGS: The patient is slightly rotated toward the right. Cardiac and mediastinal contours remain unchanged. Patient is status post median sternotomy with evidence of prior CABG. Developing patchy airspace opacity in the left lung base partially obscuring the cardiac margin. Otherwise,  unchanged appearance of the lungs with chronic bronchitic change but otherwise clear. No acute osseous abnormality. IMPRESSION: 1. Developing patchy airspace opacity in the lingula may reflect atelectasis or developing infiltrate. 2. Otherwise, stable chest x-ray compared to earlier today. Electronically Signed   By: Malachy Moan M.D.   On: 06/01/2015 18:36   I have personally reviewed and evaluated these images and lab results as part of my medical decision-making.   EKG Interpretation   Date/Time:  Monday June 01 2015 13:44:11 EST Ventricular Rate:  74 PR Interval:    QRS Duration: 114 QT Interval:  386 QTC Calculation: 428 R Axis:   52 Text Interpretation:  Atrial flutter with 4:1 A-V conduction Abnormal ECG  In normal sinus rhythm on prior EKG Confirmed by Elley Harp MD, Rad [facility] [phone]) on  06/01/2015 8:34:15 PM       CRITICAL CARE Performed by: Lavera Guise   Total critical care time: 35 minutes  Critical care time was exclusive of separately billable procedures and treating other patients.  Critical care was necessary to treat or prevent imminent or life-threatening deterioration.  Critical care was time spent personally by me on the following activities: development of treatment plan with patient and/or surrogate as well as nursing, discussions with consultants, evaluation of patient's response to treatment, examination of patient, obtaining history from patient or surrogate, ordering and performing treatments and interventions, ordering and review of laboratory studies, ordering and review of radiographic studies, pulse oximetry and re-evaluation of patient's condition.   MDM   Final diagnoses:  Hypoglycemia  Hypoxia  Bronchitis    59 year old male who presents with hypoglycemia. Was hypoglycemic on presentation to 26 and given 25 g D50 with improved glucose to 180s. Has recurrent hypglycemia in the ED to 64 and received additional 25 g D50. Started on D10 gtt for  recurrent hypoglycemia in setting of significantly decreased PO intake. Minimal bump in creatinine to 1.2 from baseline of 0.8-1. No major electrolyte derangements and normal lactate.   Has oxygen requirement in the ED, with very congested breath sounds. Normal EF on ECHO in 08/2014 with diastolic dysfunction. CXR with vascular congestion and no major edema or infiltrate. Given duoneb for symptomatic treatment. Noted also to be in atrial flutter with 4:1 at rate of 70s  which has not been documented before. BNP/trop pending. UA pending. VBG with respiratory acidosis initially, but not as significant/severe on ABG. He is mentating well and does not require biPAP.  Discussed with [facility] and admitted to stepdown for ongoing mangement.  Lavera Guise, MD 06/01/15 1610  Lavera Guise, MD 06/01/15 2049

## 2015-06-01 NOTE — ED Notes (Signed)
MD at bedside. 

## 2015-06-02 LAB — GLUCOSE, CAPILLARY
GLUCOSE-CAPILLARY: 246 mg/dL — AB (ref 65–99)
GLUCOSE-CAPILLARY: 199 mg/dL — AB (ref 65–99)
Glucose-Capillary: 132 mg/dL — ABNORMAL HIGH (ref 65–99)
Glucose-Capillary: 194 mg/dL — ABNORMAL HIGH (ref 65–99)
Glucose-Capillary: 281 mg/dL — ABNORMAL HIGH (ref 65–99)

## 2015-06-02 LAB — COMPREHENSIVE METABOLIC PANEL
ALK PHOS: 80 U/L (ref 38–126)
ALT: 12 U/L — AB (ref 17–63)
AST: 14 U/L — AB (ref 15–41)
Albumin: 3.9 g/dL (ref 3.5–5.0)
Anion gap: 9 (ref 5–15)
BILIRUBIN TOTAL: 0.8 mg/dL (ref 0.3–1.2)
BUN: 25 mg/dL — AB (ref 6–20)
CO2: 32 mmol/L (ref 22–32)
CREATININE: 1.25 mg/dL — AB (ref 0.61–1.24)
Calcium: 9.1 mg/dL (ref 8.9–10.3)
Chloride: 95 mmol/L — ABNORMAL LOW (ref 101–111)
GFR calc Af Amer: >60 mL/min (ref >60)
GLUCOSE: 199 mg/dL — AB (ref 65–99)
Potassium: 4.4 mmol/L (ref 3.5–5.1)
Sodium: 136 mmol/L (ref 135–145)
TOTAL PROTEIN: 7.7 g/dL (ref 6.5–8.1)

## 2015-06-02 LAB — TROPONIN I: Troponin I: <0.03 ng/mL (ref <0.031)

## 2015-06-02 MED ORDER — DEXTROSE 5 % IV SOLN
500.0000 mg | INTRAVENOUS | Status: DC
  Administered 2015-06-02 – 2015-06-05 (×4): 500 mg via INTRAVENOUS
  Filled 2015-06-02 (×4): qty 500

## 2015-06-02 MED ORDER — POTASSIUM CHLORIDE CRYS ER 20 MEQ PO TBCR
40.0000 meq | EXTENDED_RELEASE_TABLET | Freq: Once | ORAL | Status: AC
  Administered 2015-06-02: 40 meq via ORAL
  Filled 2015-06-02: qty 2

## 2015-06-02 MED ORDER — INSULIN ASPART 100 UNIT/ML ~~LOC~~ SOLN
0.0000 [IU] | Freq: Three times a day (TID) | SUBCUTANEOUS | Status: DC
  Administered 2015-06-02: 8 [IU] via SUBCUTANEOUS
  Administered 2015-06-03: 3 [IU] via SUBCUTANEOUS
  Administered 2015-06-03: 5 [IU] via SUBCUTANEOUS
  Administered 2015-06-03 – 2015-06-04 (×3): 3 [IU] via SUBCUTANEOUS
  Administered 2015-06-04: 8 [IU] via SUBCUTANEOUS
  Administered 2015-06-05: 5 [IU] via SUBCUTANEOUS
  Administered 2015-06-05: 3 [IU] via SUBCUTANEOUS
  Administered 2015-06-05: 5 [IU] via SUBCUTANEOUS

## 2015-06-02 MED ORDER — REGADENOSON 0.4 MG/5ML IV SOLN
0.4000 mg | Freq: Once | INTRAVENOUS | Status: DC
  Filled 2015-06-02: qty 5

## 2015-06-02 MED ORDER — VANCOMYCIN HCL 10 G IV SOLR
2000.0000 mg | Freq: Once | INTRAVENOUS | Status: DC
  Filled 2015-06-02: qty 2000

## 2015-06-02 MED ORDER — DEXTROSE 5 % IV SOLN
1.0000 g | INTRAVENOUS | Status: DC
  Administered 2015-06-02 – 2015-06-05 (×4): 1 g via INTRAVENOUS
  Filled 2015-06-02 (×4): qty 10

## 2015-06-02 MED ORDER — APIXABAN 5 MG PO TABS
5.0000 mg | ORAL_TABLET | Freq: Two times a day (BID) | ORAL | Status: DC
  Administered 2015-06-02 – 2015-06-05 (×6): 5 mg via ORAL
  Filled 2015-06-02 (×7): qty 1

## 2015-06-02 MED ORDER — PIPERACILLIN-TAZOBACTAM 3.375 G IVPB 30 MIN
3.3750 g | Freq: Once | INTRAVENOUS | Status: DC
  Filled 2015-06-02: qty 50

## 2015-06-02 MED ORDER — MAGNESIUM SULFATE 2 GM/50ML IV SOLN
2.0000 g | Freq: Once | INTRAVENOUS | Status: AC
  Administered 2015-06-02: 2 g via INTRAVENOUS
  Filled 2015-06-02: qty 50

## 2015-06-02 NOTE — Consult Note (Addendum)
CARDIOLOGY CONSULT NOTE  Patient ID: Clayton [name] MRN: [id number] DOB/AGE: 09/19/1955 59 y.o.  Admit date: 06/01/2015 Referring Physician: Internal Medicine Primary Physician:  [name], [name], PA-C Reason for Consultation: Atrial Flutter  HPI: Clayton [name]  is a 59 y.o. male with a history of CAD S/P MI in 1989 and again in 2009, underwent CABG at that time, diabetes, hypertension, hyperlipidemia, CVA, significant carotid artery and peripheral artery disease, s/p left BKA and right transmetatarsal amputation who presented on 06/01/2015 via EMS from skilled nursing facility with hypoglycemia.  Patient was also noted to be hypoxemic and EKG revealed atrial flutter with controlled ventricular rate.  Chest x-ray revealed stable cardiomegaly and mild vascular congestion with developing patchy airspace opacity, possibly suggestive of developing pneumonia.  No evidence of sepsis.  Patient is presently on aspirin and Plavix and we were consulted to give opinion regarding anticoagulation.  Patient has not seen a cardiologist for many years, he was being evaluated by [facility] and he last saw the many years ago.  He is now admitted with possible pneumonia, hypoglycemic episode and new onset of atrial flutter with rapid ventricular response.he denies any chest pain, shortness of breath, states that he is wheelchair-bound and unable to weight bear due to CVA and the balance.  He has not had any falls although he states that he is slumped to the ground occasionally.  Past Medical History  Diagnosis Date  . Stroke (HCC)   . Coronary artery disease   . Diabetes mellitus without complication (HCC)   . Hypertension   . MI (myocardial infarction) (HCC)   . Hyperlipidemia   . GERD (gastroesophageal reflux disease)   . Diabetic neuropathy (HCC)   . Diabetic retinopathy [facility])      Past Surgical History  Procedure Laterality Date  . Below knee leg amputation Left   . Appendectomy    . Percutaneous placement  intravascular stent cervical carotid artery Right      No family history on file.   Social History: Social History   Social History  . Marital Status: Divorced    Spouse Name: N/A  . Number of Children: N/A  . Years of Education: N/A   Occupational History  . Not on file.   Social History Main Topics  . Smoking status: Former Games developer  . Smokeless tobacco: Not on file  . Alcohol Use: No  . Drug Use: Not on file  . Sexual Activity: Not on file   Other Topics Concern  . Not on file   Social History Narrative     Prescriptions prior to admission  Medication Sig Dispense Refill Last Dose  . albuterol (PROVENTIL HFA;VENTOLIN HFA) 108 (90 BASE) MCG/ACT inhaler Inhale 1-2 puffs into the lungs every 6 (six) hours as needed for wheezing or shortness of breath.   unknown  . aspirin EC 81 MG tablet Take 162 mg by mouth daily.   05/31/2015 at Unknown time  . atorvastatin (LIPITOR) 40 MG tablet Take 40 mg by mouth daily.   05/31/2015 at Unknown time  . cholecalciferol (VITAMIN D) 1000 UNITS tablet Take 2,000 Units by mouth 2 (two) times daily.   05/31/2015 at Unknown time  . clopidogrel (PLAVIX) 75 MG tablet Take 1 tablet (75 mg total) by mouth daily. (Patient taking differently: Take 112.5 mg by mouth daily. )   05/31/2015 at 0900  . furosemide (LASIX) 40 MG tablet Take 1 tablet (40 mg total) by mouth daily as needed for fluid or edema. (Patient  taking differently: Take 40 mg by mouth daily. ) 30 tablet  05/31/2015 at Unknown time  . gabapentin (NEURONTIN) 600 MG tablet Take 1,200 mg by mouth 4 (four) times daily.   05/31/2015 at Unknown time  . HYDROcodone-acetaminophen (NORCO/VICODIN) 5-325 MG per tablet Take 1 tablet by mouth 3 (three) times daily as needed for moderate pain. 30 tablet 0 05/31/2015 at Unknown time  . insulin aspart (NOVOLOG) 100 UNIT/ML injection Inject 0-15 Units into the skin 3 (three) times daily with meals. (Patient taking differently: Inject 0-20 Units into the skin  3 (three) times daily with meals as needed for high blood sugar. 15 units in the am, 20 at lunch, and 15 units at dinner, pt states is below 200 will not take any) 10 mL 11 05/31/2015 at Unknown time  . insulin glargine (LANTUS) 100 UNIT/ML injection Inject 45 Units into the skin 2 (two) times daily.   06/01/2015 at am  . lisinopril (PRINIVIL,ZESTRIL) 2.5 MG tablet Take 2.5 mg by mouth daily.   05/31/2015 at Unknown time  . metFORMIN (GLUCOPHAGE) 1000 MG tablet Take 1,000 mg by mouth 2 (two) times daily with a meal.   05/31/2015 at Unknown time  . metoprolol (LOPRESSOR) 50 MG tablet Take 25 mg by mouth 2 (two) times daily.   05/31/2015 at 2000  . Multiple Vitamin (MULTIVITAMIN WITH MINERALS) TABS tablet Take 1 tablet by mouth daily.   Past Month at Unknown time  . doxycycline (VIBRAMYCIN) 100 MG capsule Take 1 capsule (100 mg total) by mouth 2 (two) times daily. (Patient not taking: Reported on 06/01/2015) 20 capsule 0 Completed Course at Unknown time  . [DISCONTINUED] aspirin 325 MG tablet Take 1 tablet (325 mg total) by mouth daily. (Patient not taking: Reported on 06/01/2015)   Not Taking at Unknown time     ROS: General: no fevers/chills/night sweats Eyes: no blurry vision, diplopia, or amaurosis ENT: no sore throat or hearing loss Resp: no cough, wheezing, or hemoptysis CV: no chest pain, edema or palpitations GI: no abdominal pain, nausea, vomiting, diarrhea, or constipation GU: no dysuria, frequency, or hematuria Skin: no rash Neuro: Weakness in both legs and gait imbalance.  Musculoskeletal: weakness in both legs and has amputation left BKA and right transmetatarsal.  Heme: no bleeding, DVT, or easy bruising Endo: no polydipsia or polyuria    Physical Exam: Blood pressure 129/60, pulse 74, temperature 97.6 F (36.4 C), temperature source Oral, resp. rate 0, height  (1.727 m), weight 109 kg (240 lb 4.8 oz), SpO2 97 %.   General appearance: alert, cooperative and no  distress Neck: no adenopathy, no carotid bruit, no JVD, supple, symmetrical, trachea midline and thyroid not enlarged, symmetric, no tenderness/mass/nodules Lungs: clear to auscultation bilaterally Chest wall: no tenderness Heart: regular rate and rhythm, S1, S2 normal, no murmur, click, rub or gallop Extremities: Left BKA, right metatarsal amputation Pulses: 2+ femoral pulses without bruit  Absent popliteal pulse and right pedal pulse. Left BKA. Bilateral carotid bruit.   Labs:   Lab Results  Component Value Date   WBC 12.9* 06/01/2015   HGB 12.5* 06/01/2015   HCT 39.1 06/01/2015   MCV 89.1 06/01/2015   PLT 237 06/01/2015    Recent Labs Lab 06/02/15 0856  NA 136  K 4.4  CL 95*  CO2 32  BUN 25*  CREATININE 1.25*  CALCIUM 9.1  PROT 7.7  BILITOT 0.8  ALKPHOS 80  ALT 12*  AST 14*  GLUCOSE 199*    Lipid Panel  Component Value Date/Time   CHOL 128 08/10/2014 0400   TRIG 238* 08/10/2014 0400   HDL 21* 08/10/2014 0400   CHOLHDL 6.1 08/10/2014 0400   VLDL 48* 08/10/2014 0400   LDLCALC 59 08/10/2014 0400    BNP (last 3 results)  Recent Labs  03/23/15 1630 06/01/15 1235  BNP 112.3* 58.5    HEMOGLOBIN A1C Lab Results  Component Value Date   HGBA1C 8.3* 08/11/2014   MPG 192 08/11/2014    Cardiac Panel (last 3 results)  Recent Labs  03/23/15 1630 06/01/15 1756 06/01/15 2300  TROPONINI <0.03 <0.03 <0.03    Lab Results  Component Value Date   CKTOTAL 295* 01/26/2008   CKMB 10.4* 01/26/2008   TROPONINI <0.03 06/01/2015     EKG 06/01/2015: Atrial flutter with 4-1 conduction with ventricular rate of 74 bpm, normal axis, poor R-wave progression  Echo:  Echocardiogram to 07/30/14: Normal LV systolic function, ejection fraction 55-60%, apical hypokinesis, grade 2 diastolic dysfunction.  No significant valvular abnormalities.   Radiology: Dg Chest 2 View  06/01/2015  CLINICAL DATA:  59 year old male with cough, congestion and hypoglycemia EXAM: CHEST   2 VIEW COMPARISON:  Prior chest x-ray 03/23/2015 FINDINGS: Stable cardiomegaly. Patient is status post median sternotomy with evidence of prior multivessel CABG. Mild pulmonary vascular congestion without overt edema is similar compared to prior imaging. No focal airspace consolidation, pleural effusion or pneumothorax. No suspicious pulmonary nodule or mass. No acute osseous abnormality. Chronic bronchitic changes are also similar compared to prior. IMPRESSION: 1. Stable cardiomegaly and mild vascular congestion without overt edema. 2. No acute cardiopulmonary process. Electronically Signed   By: Malachy MoanHeath  McCullough M.D.   On: 06/01/2015 13:18   Dg Chest Port 1 View  06/01/2015  CLINICAL DATA:  59 year old male with hypoglycemia, confusion and productive cough EXAM: PORTABLE CHEST 1 VIEW COMPARISON:  Prior chest x-ray obtained earlier today FINDINGS: The patient is slightly rotated toward the right. Cardiac and mediastinal contours remain unchanged. Patient is status post median sternotomy with evidence of prior CABG. Developing patchy airspace opacity in the left lung base partially obscuring the cardiac margin. Otherwise, unchanged appearance of the lungs with chronic bronchitic change but otherwise clear. No acute osseous abnormality. IMPRESSION: 1. Developing patchy airspace opacity in the lingula may reflect atelectasis or developing infiltrate. 2. Otherwise, stable chest x-ray compared to earlier today. Electronically Signed   By: Malachy MoanHeath  McCullough M.D.   On: 06/01/2015 18:36    Scheduled Meds: . antiseptic oral rinse  7 mL Mouth Rinse BID  . aspirin  325 mg Oral Daily  . atorvastatin  40 mg Oral Daily  . azithromycin  500 mg Intravenous Q24H  . cefTRIAXone (ROCEPHIN)  IV  1 g Intravenous Q24H  . cholecalciferol  2,000 Units Oral BID  . clopidogrel  75 mg Oral Daily  . enoxaparin (LOVENOX) injection  40 mg Subcutaneous Q24H  . furosemide  40 mg Intravenous BID  . gabapentin  300 mg Oral QID   . insulin aspart  0-9 Units Subcutaneous TID WC  . ipratropium-albuterol  3 mL Nebulization TID  . metoprolol  25 mg Oral BID  . sodium chloride  3 mL Intravenous Q12H  . triamcinolone cream   Topical QID   Continuous Infusions: . dextrose Stopped (06/02/15 0834)   PRN Meds:.acetaminophen **OR** acetaminophen, alum & mag hydroxide-simeth, furosemide, HYDROcodone-acetaminophen, levalbuterol, ondansetron **OR** ondansetron (ZOFRAN) IV, senna-docusate  ASSESSMENT AND PLAN:  1. Atrial flutter with variable AV conduction and RVR.  (CHA2DS2-VASCScore: Risk Score  5,  Yearly risk of stroke 6.7. OAC Has Bled: Score 2.  Estimated risk of major bleeding at 1 year with OAC 1.88-3.2%) 2. Pneumonia 3. H/O CVA 4. Carotid Artery Stenosis 5. CAD: CABG 27/21/2009,  x4 (LIMA to mid LAD, SVG to  OM1, and sequential SVG to acute marginal and distal RCA.  Proximal LAD 70% and severe diffuse disease distal LAD and high grade Cx and Occluded RCA with EF 35%). 6. Hypertension 7. Hyperlipidemia 8. DM 2 uncontrolled 9. Chronic pain syndrome.  Recommendation: Patient admitted for possible pneumonia and hypoglycemia, incidental finding of atrial flutter.  Normal LVEF 6 months ago, echocardiogram from this morning pending, would recommend anticoagulation with Eliquis 5 mg twice a day,  Discontinue Plavix and aspirin for now.  He would certainly be a good candidate for atrial flutter ablation.  Coronary artery disease progression needs to be excluded, needs stress testing which can be done tomorrow if possible.  He needs at least 3 weeks of anticoagulation prior to consideration of atrial flutter ablation so long-term anticoagulation can be avoided in a patient with multiple medical comorbidities.  His heart rate is well controlled hence no changes in the medications is recommended at this time. I will try to set this stress testing tomorrow if unable to do so, we can certainly do this on an elective basis in the  outpatient setting.

## 2015-06-02 NOTE — Progress Notes (Addendum)
ANTICOAGULATION CONSULT NOTE - Initial Consult  Pharmacy Consult for Apixaban Indication: atrial flutter  Allergies  Allergen Reactions  . Niaspan [Niacin Er] Other (See Comments)    Face was red and hot    Patient Measurements: Height: 5\' 8"  (172.7 cm) Weight: 240 lb 4.8 oz (109 kg) IBW/kg (Calculated) : 68.4   Vital Signs: Temp: 98.8 F (37.1 C) (11/22 2014) Temp Source: Oral (11/22 2014) BP: 121/69 mmHg (11/22 2014) Pulse Rate: 75 (11/22 2014)  Labs:  Recent Labs  06/01/15 1235 06/01/15 1756 06/01/15 2300 06/02/15 0856 06/02/15 1538  HGB 12.5*  --   --   --   --   HCT 39.1  --   --   --   --   PLT 237  --   --   --   --   CREATININE 1.24  --   --  1.25*  --   TROPONINI  --  <0.03 <0.03  --  <0.03    Estimated Creatinine Clearance: 74.3 mL/min (by C-G formula based on Cr of 1.25).   Medical History: Past Medical History  Diagnosis Date  . Stroke (HCC)   . Coronary artery disease   . Diabetes mellitus without complication (HCC)   . Hypertension   . MI (myocardial infarction) (HCC)   . Hyperlipidemia   . GERD (gastroesophageal reflux disease)   . Diabetic neuropathy (HCC)   . Diabetic retinopathy [facility](HCC)      Assessment: 89 yoM with h/o CAD s/p MD, s/p CABG, DM, HTN, HLD, CVA, signficant carotid artery and peripheral artery disease s/p left BKA and right transmetatarsal amputation who presented on 06/01/2015 via EMS from skilled nursing facility with hypoglycemia, admitted with possible pneumonia and found new onset atrial flutter with RVR. Cardiology consulted and recommended apixaban (CHA2DS2-VASc score = 5) x 3 weeks and then possible ablation, discontinue aspirin and plavix for now.    - weight 109 kg - SCr 1.25, CrCl~74 ml/min - patient does not meet criteria for dose reduction - Hgb 12.5, platelets WNL - patient currently on Lovenox 40 mg SQ q24h for VTE prophylaxis, last dose tonight at ~8pm  Goal of Therapy:  Prevention of stroke and systemic  embolism   Plan:  1.  Discontinue Lovenox. 2.  Start apixaban 5 mg PO BID.  Will start dose tonight at midnight.  Pt received a subtherapeutic Lovenox dose and apixaban will peak 3-4 hours following midnight dose (7-8 hours following Lovenox dose). 3.  Monitor SCr, CBC. 4.  Pharmacist will provide education prior to discharge.  Clance Bollunyon, Kyliah Deanda 06/02/2015,9:39 PM

## 2015-06-02 NOTE — Progress Notes (Signed)
Pt is continuing to have EKG  ventricular rate changes that trend as high as 150 and then decrease back to 70-80. Pt Stat Troponin was WNL at <0.03, per cardiology will still transfer since his rate does not remain elevated. Will pass on to receiving nurse.

## 2015-06-02 NOTE — Progress Notes (Signed)
Inpatient Diabetes Program Recommendations  AACE/ADA: New Consensus Statement on Inpatient Glycemic Control (2015)  Target Ranges:  Prepandial:   less than 140 mg/dL      Peak postprandial:   less than 180 mg/dL (1-2 hours)      Critically ill patients:  140 - 180 mg/dL    Results for Clayton [name], Clayton [name] (MRN [id number]) as of 06/02/2015 08:22  Ref. Range 06/01/2015 12:29 06/01/2015 12:37 06/01/2015 13:44 06/01/2015 14:32 06/01/2015 16:00 06/01/2015 21:23  Glucose-Capillary Latest Ref Range: 65-99 mg/dL 26 (LL) 308184 (H) 62 (L) 149 (H) 123 (H) 179 (H)     Admit with: Hypoxia/ Hypoglycemia  History: DM, CVA, CAD  SNF DM Meds: Lantus 45 units bid    Novolog 15 units breakfast/ 20 units lunch/ 15 units dinner    Metformin 1000 mg bid  Current Insulin Orders: Novolog Sensitive SSi (0-9 units) TID AC    -Note pt currently receiving D10% IVF at 50cc/hour.  -Glucose levels have stabilized.  -Note current A1c level pending.  -Will likely need a portion of his Lantus added back at some point.  Would start with a much lower dose than he has been taking and titrate upward based on CBGs.      --Will follow patient during hospitalization--  Ambrose FinlandJeannine Johnston Kathrene Sinopoli RN, MSN, CDE Diabetes Coordinator Inpatient Glycemic Control Team Team Pager: [phone] (8a-5p)

## 2015-06-02 NOTE — Progress Notes (Signed)
Pt had EKG changes. While getting pt ready to transfer to the floor, pt HR increased to a ventricular rate ranging from mid 130's to 150's. His rhythm remained elevated for a couple minutes and then went back down to his baseline of atrial flutter with a rate ranging 70's to 80's. A 12-lead EKG was preformed and Cardiology MD was notified. A STAT troponin was drawn. Per MD hold transfer until the Troponin results. Will follow-up. V/S currently stable.

## 2015-06-02 NOTE — Progress Notes (Signed)
[facility] PROGRESS NOTE  Clayton [name] YQM:[id number] DOB: 05/15/1956 DOA: 06/01/2015 PCP: [name], [name], PA-C  Length of stay: 1   Assessment/Plan: Principal Problem:   Hypoglycemia Active Problems:   CAD (coronary artery disease)   DM type 2 with diabetic peripheral neuropathy (HCC)   GERD (gastroesophageal reflux disease)   Hypoxia   Bronchitis    Brief summary 59 year old male from independent living with multiple comorbidities,(see history of present illness), admitted for hypoglycemia, probable right lower lobe pneumonia, new onset atrial flutter  Assessment and plan New-onset atrial flutter Rate controlled, patient already on aspirin 81 mg/Plavix 75 mg a day Discussed with Dr. Jacinto Halim, patient will need long-term anticoagulation We'll repeat 2-D echo, check TSH next and cardiac enzymes negative Chest x-ray shows possible pneumonia  Acute hypoxemic respiratory failure-history of acute on chronic diastolic heart failure, patient takes Lasix 40 mg a day at home, chest x-ray shows mild pulmonary vascular congestion, probable developing pneumonia in the lingula Patient had good response to IV Lasix, 3850 mL of urine output since admission continue oxygen per protocol, did not see any need for BiPAP tonight  Repeat 2-D echo   Hypoglycemia Unclear etiology , likely in the setting of pneumonia, CBGs now elevated, start D10, continue to hold Lantus, continue Accu-Cheks, hemoglobin A1c pending, diabetes coordinator consultation.   CAD (coronary artery disease)-continue beta blocker, aspirin, continue to cycle cardiac enzymes, slightly short of breath upon arrival likely secondary to pulmonary vascular congestion   Acute on chronic diastolic heart failure, 2-D echo 08/11/14, EF 55-60%, continue Lasix 40 mg twice a day IV, the setting of hypoxemia   DM type 2 with diabetic peripheral neuropathy (HCC)-hold Lantus, started on SSI, Accu-Cheks, hemoglobin A1c   GERD  (gastroesophageal reflux disease)-continue PPI  DVT prophylaxsis Lovenox  Code Status:      Code Status Orders        Start     Ordered   06/01/15 1733  Full code   Continuous     06/01/15 1734     Family Communication: family updated about patient's clinical progress Disposition Plan:  As above       Consultants:  Cardiology  Procedures:  None  Antibiotics: Anti-infectives    Start     Dose/Rate Route Frequency Ordered Stop   06/02/15 1030  azithromycin (ZITHROMAX) 500 mg in dextrose 5 % 250 mL IVPB     500 mg 250 mL/hr over 60 Minutes Intravenous Every 24 hours 06/02/15 0838     06/02/15 1000  vancomycin (VANCOCIN) 2,000 mg in sodium chloride 0.9 % 500 mL IVPB  Status:  Discontinued     2,000 mg 250 mL/hr over 120 Minutes Intravenous  Once 06/02/15 0825 06/02/15 0837   06/02/15 1000  cefTRIAXone (ROCEPHIN) 1 g in dextrose 5 % 50 mL IVPB     1 g 100 mL/hr over 30 Minutes Intravenous Every 24 hours 06/02/15 0838     06/02/15 0900  piperacillin-tazobactam (ZOSYN) IVPB 3.375 g  Status:  Discontinued     3.375 g 100 mL/hr over 30 Minutes Intravenous  Once 06/02/15 0825 06/02/15 0837         HPI/Subjective: Patient is alert and oriented, denies any chest pain or shortness of breath  Objective: Filed Vitals:   06/02/15 0700 06/02/15 0800 06/02/15 0833 06/02/15 0935  BP:  129/60    Pulse: 72 61  74  Temp:  97.6 F (36.4 C)    TempSrc:  Oral  Resp: 20 0    Height:      Weight:      SpO2: 86% 99% 97%     Intake/Output Summary (Last 24 hours) at 06/02/15 1115 Last data filed at 06/02/15 0950  Gross per 24 hour  Intake 1640.08 ml  Output   5550 ml  Net -3909.92 ml    Exam:  General: No acute respiratory distress Lungs: Clear to auscultation bilaterally without wheezes or crackles Cardiovascular: Regular rate and rhythm without murmur gallop or rub normal S1 and S2 Abdomen: Nontender, nondistended, soft, bowel sounds positive, no rebound, no  ascites, no appreciable mass Extremities: Left, BKA, no clubbing, or edema bilateral lower extremities     Data Review   Micro Results Recent Results (from the past 240 hour(s))  MRSA PCR Screening     Status: None   Collection Time: 06/01/15  5:13 PM  Result Value Ref Range Status   MRSA by PCR NEGATIVE NEGATIVE Final    Comment:        The GeneXpert MRSA Assay (FDA approved for NASAL specimens only), is one component of a comprehensive MRSA colonization surveillance program. It is not intended to diagnose MRSA infection nor to guide or monitor treatment for MRSA infections.     Radiology Reports Dg Chest 2 View  06/01/2015  CLINICAL DATA:  59 year old male with cough, congestion and hypoglycemia EXAM: CHEST  2 VIEW COMPARISON:  Prior chest x-ray 03/23/2015 FINDINGS: Stable cardiomegaly. Patient is status post median sternotomy with evidence of prior multivessel CABG. Mild pulmonary vascular congestion without overt edema is similar compared to prior imaging. No focal airspace consolidation, pleural effusion or pneumothorax. No suspicious pulmonary nodule or mass. No acute osseous abnormality. Chronic bronchitic changes are also similar compared to prior. IMPRESSION: 1. Stable cardiomegaly and mild vascular congestion without overt edema. 2. No acute cardiopulmonary process. Electronically Signed   By: Malachy MoanHeath  McCullough M.D.   On: 06/01/2015 13:18   Dg Chest Port 1 View  06/01/2015  CLINICAL DATA:  59 year old male with hypoglycemia, confusion and productive cough EXAM: PORTABLE CHEST 1 VIEW COMPARISON:  Prior chest x-ray obtained earlier today FINDINGS: The patient is slightly rotated toward the right. Cardiac and mediastinal contours remain unchanged. Patient is status post median sternotomy with evidence of prior CABG. Developing patchy airspace opacity in the left lung base partially obscuring the cardiac margin. Otherwise, unchanged appearance of the lungs with chronic  bronchitic change but otherwise clear. No acute osseous abnormality. IMPRESSION: 1. Developing patchy airspace opacity in the lingula may reflect atelectasis or developing infiltrate. 2. Otherwise, stable chest x-ray compared to earlier today. Electronically Signed   By: Malachy MoanHeath  McCullough M.D.   On: 06/01/2015 18:36     CBC  Recent Labs Lab 06/01/15 1235  WBC 12.9*  HGB 12.5*  HCT 39.1  PLT 237  MCV 89.1  MCH 28.5  MCHC 32.0  RDW 16.2*  LYMPHSABS 1.9  MONOABS 0.9  EOSABS 0.1  BASOSABS 0.0    Chemistries   Recent Labs Lab 06/01/15 1235 06/01/15 1756 06/02/15 0856  NA 138  --  136  K 3.6  --  4.4  CL 98*  --  95*  CO2 30  --  32  GLUCOSE 34*  --  199*  BUN 28*  --  25*  CREATININE 1.24  --  1.25*  CALCIUM 9.0  --  9.1  MG  --  1.4*  --   AST 19  --  14*  ALT 14*  --  12*  ALKPHOS 85  --  80  BILITOT 0.8  --  0.8   ------------------------------------------------------------------------------------------------------------------ estimated creatinine clearance is 74.3 mL/min (by C-G formula based on Cr of 1.25). ------------------------------------------------------------------------------------------------------------------ No results for input(s): HGBA1C in the last 72 hours. ------------------------------------------------------------------------------------------------------------------ No results for input(s): CHOL, HDL, LDLCALC, TRIG, CHOLHDL, LDLDIRECT in the last 72 hours. ------------------------------------------------------------------------------------------------------------------ No results for input(s): TSH, T4TOTAL, T3FREE, THYROIDAB in the last 72 hours.  Invalid input(s): FREET3 ------------------------------------------------------------------------------------------------------------------ No results for input(s): VITAMINB12, FOLATE, FERRITIN, TIBC, IRON, RETICCTPCT in the last 72 hours.  Coagulation profile No results for input(s): INR, PROTIME  in the last 168 hours.  No results for input(s): DDIMER in the last 72 hours.  Cardiac Enzymes  Recent Labs Lab 06/01/15 1756 06/01/15 2300  TROPONINI <0.03 <0.03   ------------------------------------------------------------------------------------------------------------------ Invalid input(s): POCBNP   CBG:  Recent Labs Lab 06/01/15 1344 06/01/15 1432 06/01/15 1600 06/01/15 2123 06/02/15 0828  GLUCAP 62* 149* 123* 179* 246*       Studies: Dg Chest 2 View  06/01/2015  CLINICAL DATA:  59 year old male with cough, congestion and hypoglycemia EXAM: CHEST  2 VIEW COMPARISON:  Prior chest x-ray 03/23/2015 FINDINGS: Stable cardiomegaly. Patient is status post median sternotomy with evidence of prior multivessel CABG. Mild pulmonary vascular congestion without overt edema is similar compared to prior imaging. No focal airspace consolidation, pleural effusion or pneumothorax. No suspicious pulmonary nodule or mass. No acute osseous abnormality. Chronic bronchitic changes are also similar compared to prior. IMPRESSION: 1. Stable cardiomegaly and mild vascular congestion without overt edema. 2. No acute cardiopulmonary process. Electronically Signed   By: Malachy Moan M.D.   On: 06/01/2015 13:18   Dg Chest Port 1 View  06/01/2015  CLINICAL DATA:  60 year old male with hypoglycemia, confusion and productive cough EXAM: PORTABLE CHEST 1 VIEW COMPARISON:  Prior chest x-ray obtained earlier today FINDINGS: The patient is slightly rotated toward the right. Cardiac and mediastinal contours remain unchanged. Patient is status post median sternotomy with evidence of prior CABG. Developing patchy airspace opacity in the left lung base partially obscuring the cardiac margin. Otherwise, unchanged appearance of the lungs with chronic bronchitic change but otherwise clear. No acute osseous abnormality. IMPRESSION: 1. Developing patchy airspace opacity in the lingula may reflect atelectasis or  developing infiltrate. 2. Otherwise, stable chest x-ray compared to earlier today. Electronically Signed   By: Malachy Moan M.D.   On: 06/01/2015 18:36      Lab Results  Component Value Date   HGBA1C 8.3* 08/11/2014   HGBA1C 8.3* 08/10/2014   HGBA1C * 01/26/2008    6.3 (NOTE)   The ADA recommends the following therapeutic goal for glycemic   control related to Hgb A1C measurement:   Goal of Therapy:   < 7.0% Hgb A1C   Reference: American Diabetes Association: Clinical Practice   Recommendations 2008, Diabetes Care,  2008, 31:(Suppl 1).   Lab Results  Component Value Date   LDLCALC 59 08/10/2014   CREATININE 1.25* 06/02/2015       Scheduled Meds: . antiseptic oral rinse  7 mL Mouth Rinse BID  . aspirin  325 mg Oral Daily  . atorvastatin  40 mg Oral Daily  . azithromycin  500 mg Intravenous Q24H  . cefTRIAXone (ROCEPHIN)  IV  1 g Intravenous Q24H  . cholecalciferol  2,000 Units Oral BID  . clopidogrel  75 mg Oral Daily  . enoxaparin (LOVENOX) injection  40 mg Subcutaneous Q24H  . furosemide  40 mg Intravenous BID  .  gabapentin  300 mg Oral QID  . insulin aspart  0-9 Units Subcutaneous TID WC  . ipratropium-albuterol  3 mL Nebulization TID  . metoprolol  25 mg Oral BID  . sodium chloride  3 mL Intravenous Q12H  . triamcinolone cream   Topical QID   Continuous Infusions: . dextrose Stopped (06/02/15 0834)    Principal Problem:   Hypoglycemia Active Problems:   CAD (coronary artery disease)   DM type 2 with diabetic peripheral neuropathy (HCC)   GERD (gastroesophageal reflux disease)   Hypoxia   Bronchitis    Time spent: 45 minutes   [facility]  [facility] Pager [phone]. If 7PM-7AM, please contact night-coverage at [email or web address], password [facility] 06/02/2015, 11:15 AM  LOS: 1 day

## 2015-06-02 NOTE — Care Management Note (Signed)
Case Management Note  Patient Details  Name: Clayton [name] MRN: [id number] Date of Birth: Nov 27, 1955  Subjective/Objective:            Hypoglycemia and hypoxia        Action/Plan:Date: June 02, 2015 Chart reviewed for concurrent status and case management needs. Will continue to follow patient for changes and needs: Clayton Smilinghonda Juni Glaab, RN, BSN, [place]   [phone]   Expected Discharge Date:                  Expected Discharge Plan:  Skilled Nursing Facility  In-House Referral:  Clinical Social Work  Discharge planning Services  CM Consult  Post Acute Care Choice:  NA Choice offered to:  NA  DME Arranged:    DME Agency:     HH Arranged:    HH Agency:     Status of Service:  In process, will continue to follow  Medicare Important Message Given:    Date Medicare IM Given:    Medicare IM give by:    Date Additional Medicare IM Given:    Additional Medicare Important Message give by:     If discussed at Long Length of Stay Meetings, dates discussed:    Additional Comments:  Clayton [name], Clayton Cavallaro Lynn, RN 06/02/2015, 10:32 AM

## 2015-06-02 NOTE — Evaluation (Signed)
Clinical/Bedside Swallow Evaluation Patient Details  Name: Clayton [name] MRN: [id number] Date of Birth: Aug 01, 1955  Today's Date: 06/02/2015 Time: SLP Start Time (ACUTE ONLY): 1145 SLP Stop Time (ACUTE ONLY): 1200 SLP Time Calculation (min) (ACUTE ONLY): 15 min  Past Medical History:  Past Medical History  Diagnosis Date  . Stroke (HCC)   . Coronary artery disease   . Diabetes mellitus without complication (HCC)   . Hypertension   . MI (myocardial infarction) (HCC)   . Hyperlipidemia   . GERD (gastroesophageal reflux disease)   . Diabetic neuropathy (HCC)   . Diabetic retinopathy [facility])    Past Surgical History:  Past Surgical History  Procedure Laterality Date  . Below knee leg amputation Left   . Appendectomy    . Percutaneous placement intravascular stent cervical carotid artery Right    HPI:  59 year old male with past medical history of hypertension, hyperlipidemia, CVA, GERD, status post intracranial stent, coronary artery disease, left BKA, right transmetatarsal amputation presented to [facility] [place] from SNF where he was found to be hypoglycemic,not improved with glucose. Per MD note, placed on Ventimask at 40%and transferred to [facility]. CXR 11/21 developing patchy airspace opacity in the lingula may reflect atelectasis or developing infiltrate. MBS 08/12/14 acute-on-chronic dysphagia with high penetration of nectar, trace aspiration of thin with large sips. Dys 3 thin with small sips recommended.   Assessment / Plan / Recommendation Clinical Impression  Pt has chronic dysphagia from prior strokes, most recent 08/2014. He reports intermittent difficulty and states he is aware of when he may have difficulty and measures to diminish. Mild dysphagia present with minimal lingual residue post cracker with pt awareness and ablity to clear with additional sips liquid. Suspect delayed swallow response intermittently. Audible swallow present that can be indicative of cervical  abnormality or discoordination. Pt has GERD which can also result in aspiration. Pt re-educated on importance of upright posture during meals and minimum of 30 minutes after, small sips, pt doesn't use straws. Recommend continue regular texture, thin liquids. No continued ST intervention needed.       Aspiration Risk  Moderate aspiration risk    Diet Recommendation     Medication Administration: Whole meds with puree    Other  Recommendations Oral Care Recommendations: Oral care BID   Follow up Recommendations  None    Frequency and Duration            Swallow Study   General HPI: 59 year old male with past medical history of hypertension, hyperlipidemia, CVA, GERD, status post intracranial stent, coronary artery disease, left BKA, right transmetatarsal amputation presented to [facility] [place] from SNF where he was found to be hypoglycemic,not improved with glucose. Per MD note, placed on Ventimask at 40%and transferred to [facility]. CXR 11/21 developing patchy airspace opacity in the lingula may reflect atelectasis or developing infiltrate. MBS 08/12/14 acute-on-chronic dysphagia with high penetration of nectar, trace aspiration of thin with large sips. Dys 3 thin with small sips recommended. Type of Study: Bedside Swallow Evaluation Previous Swallow Assessment:  (see HPI) Diet Prior to this Study: Regular;Thin liquids Temperature Spikes Noted: No Respiratory Status: Room air History of Recent Intubation: No Behavior/Cognition: Alert;Cooperative;Pleasant mood Oral Cavity Assessment: Within Functional Limits Oral Care Completed by SLP: No Oral Cavity - Dentition:  (natural lower, missing some upper) Vision: Functional for self-feeding Self-Feeding Abilities: Able to feed self;Needs assist;Needs set up Patient Positioning: Upright in bed Baseline Vocal Quality: Normal Volitional Cough: Strong Volitional Swallow: Able  to elicit    Oral/Motor/Sensory Function Overall Oral  Motor/Sensory Function:  (baseline labial weakness)   Ice Chips Ice chips: Not tested   Thin Liquid Thin Liquid: Impaired Presentation: Cup Pharyngeal  Phase Impairments: Suspected delayed Swallow (audible swallow)    Nectar Thick Nectar Thick Liquid: Not tested   Honey Thick Honey Thick Liquid: Not tested   Puree Puree: Impaired Pharyngeal Phase Impairments:  (audible swallow)   Solid Solid: Impaired Oral Phase Impairments: Impaired mastication Oral Phase Functional Implications: Left anterior spillage (mild, pt aware)       Royce MacadamiaLitaker, Jawann Urbani Willis 06/02/2015,12:15 PM  Breck CoonsLisa Willis Lonell FaceLitaker M.Ed [employer]CCC-SLP Pager [phone]

## 2015-06-03 ENCOUNTER — Other Ambulatory Visit: Payer: Self-pay

## 2015-06-03 ENCOUNTER — Inpatient Hospital Stay (HOSPITAL_COMMUNITY): Payer: Non-veteran care

## 2015-06-03 ENCOUNTER — Ambulatory Visit (HOSPITAL_COMMUNITY)
Admit: 2015-06-03 | Discharge: 2015-06-03 | Disposition: A | Discharge status: Home / Self Care | Payer: Non-veteran care | Attending: Cardiology | Admitting: Cardiology

## 2015-06-03 DIAGNOSIS — I4892 Unspecified atrial flutter: Secondary | ICD-10-CM

## 2015-06-03 DIAGNOSIS — E1142 Type 2 diabetes mellitus with diabetic polyneuropathy: Secondary | ICD-10-CM

## 2015-06-03 DIAGNOSIS — I25119 Atherosclerotic heart disease of native coronary artery with unspecified angina pectoris: Secondary | ICD-10-CM

## 2015-06-03 LAB — GLUCOSE, CAPILLARY
GLUCOSE-CAPILLARY: 191 mg/dL — AB (ref 65–99)
GLUCOSE-CAPILLARY: 183 mg/dL — AB (ref 65–99)
GLUCOSE-CAPILLARY: 249 mg/dL — AB (ref 65–99)

## 2015-06-03 LAB — CBC
HCT: 37.3 % — ABNORMAL LOW (ref 39.0–52.0)
Hemoglobin: 11.8 g/dL — ABNORMAL LOW (ref 13.0–17.0)
MCH: 28.5 pg (ref 26.0–34.0)
MCHC: 31.6 g/dL (ref 30.0–36.0)
MCV: 90.1 fL (ref 78.0–100.0)
PLATELETS: 178 10*3/uL (ref 150–400)
RBC: 4.14 MIL/uL — ABNORMAL LOW (ref 4.22–5.81)
RDW: 15.8 % — AB (ref 11.5–15.5)
WBC: 7.9 10*3/uL (ref 4.0–10.5)

## 2015-06-03 LAB — COMPREHENSIVE METABOLIC PANEL
ALBUMIN: 3.6 g/dL (ref 3.5–5.0)
ALT: 12 U/L — ABNORMAL LOW (ref 17–63)
ANION GAP: 9 (ref 5–15)
AST: 12 U/L — AB (ref 15–41)
Alkaline Phosphatase: 76 U/L (ref 38–126)
BUN: 26 mg/dL — AB (ref 6–20)
CHLORIDE: 97 mmol/L — AB (ref 101–111)
CO2: 32 mmol/L (ref 22–32)
Calcium: 9.2 mg/dL (ref 8.9–10.3)
Creatinine, Ser: 1.19 mg/dL (ref 0.61–1.24)
GFR calc Af Amer: >60 mL/min (ref >60)
Glucose, Bld: 168 mg/dL — ABNORMAL HIGH (ref 65–99)
POTASSIUM: 4.5 mmol/L (ref 3.5–5.1)
Sodium: 138 mmol/L (ref 135–145)
Total Bilirubin: 0.9 mg/dL (ref 0.3–1.2)
Total Protein: 7.5 g/dL (ref 6.5–8.1)

## 2015-06-03 LAB — TSH: TSH: 0.704 u[IU]/mL (ref 0.350–4.500)

## 2015-06-03 LAB — T4, FREE: FREE T4: 1 ng/dL (ref 0.61–1.12)

## 2015-06-03 LAB — HEMOGLOBIN A1C
HEMOGLOBIN A1C: 5.7 % — AB (ref 4.8–5.6)
MEAN PLASMA GLUCOSE: 117 mg/dL

## 2015-06-03 MED ORDER — GABAPENTIN 300 MG PO CAPS
600.0000 mg | ORAL_CAPSULE | Freq: Four times a day (QID) | ORAL | Status: DC
  Administered 2015-06-03 – 2015-06-05 (×9): 600 mg via ORAL
  Filled 2015-06-03 (×9): qty 2

## 2015-06-03 MED ORDER — METOPROLOL TARTRATE 1 MG/ML IV SOLN
5.0000 mg | Freq: Once | INTRAVENOUS | Status: AC
  Administered 2015-06-03: 5 mg via INTRAVENOUS
  Filled 2015-06-03: qty 5

## 2015-06-03 MED ORDER — HYDROCODONE-ACETAMINOPHEN 5-325 MG PO TABS
2.0000 | ORAL_TABLET | Freq: Three times a day (TID) | ORAL | Status: DC | PRN
  Administered 2015-06-03 – 2015-06-04 (×3): 2 via ORAL
  Filled 2015-06-03 (×3): qty 2

## 2015-06-03 MED ORDER — TRAMADOL HCL 50 MG PO TABS
50.0000 mg | ORAL_TABLET | Freq: Four times a day (QID) | ORAL | Status: DC | PRN
  Administered 2015-06-04: 50 mg via ORAL
  Filled 2015-06-03: qty 1

## 2015-06-03 MED ORDER — TECHNETIUM TC 99M SESTAMIBI GENERIC - CARDIOLITE
10.0000 | Freq: Once | INTRAVENOUS | Status: AC | PRN
  Administered 2015-06-03: 10 via INTRAVENOUS

## 2015-06-03 MED ORDER — TECHNETIUM TC 99M SESTAMIBI - CARDIOLITE
30.0000 | Freq: Once | INTRAVENOUS | Status: AC | PRN
  Administered 2015-06-03: 12:00:00 30 via INTRAVENOUS

## 2015-06-03 MED ORDER — INSULIN GLARGINE 100 UNIT/ML ~~LOC~~ SOLN
10.0000 [IU] | Freq: Every day | SUBCUTANEOUS | Status: DC
  Administered 2015-06-04: 10 [IU] via SUBCUTANEOUS
  Filled 2015-06-03: qty 0.1

## 2015-06-03 MED ORDER — REGADENOSON 0.4 MG/5ML IV SOLN
INTRAVENOUS | Status: AC
  Filled 2015-06-03: qty 5

## 2015-06-03 MED ORDER — REGADENOSON 0.4 MG/5ML IV SOLN
0.4000 mg | Freq: Once | INTRAVENOUS | Status: AC
  Administered 2015-06-03: 0.4 mg via INTRAVENOUS

## 2015-06-03 MED ORDER — METOPROLOL TARTRATE 1 MG/ML IV SOLN
2.5000 mg | Freq: Once | INTRAVENOUS | Status: AC
  Administered 2015-06-03: 2.5 mg via INTRAVENOUS
  Filled 2015-06-03: qty 5

## 2015-06-03 NOTE — Progress Notes (Signed)
Inpatient Diabetes Program Recommendations  AACE/ADA: New Consensus Statement on Inpatient Glycemic Control (2015)  Target Ranges:  Prepandial:   less than 140 mg/dL      Peak postprandial:   less than 180 mg/dL (1-2 hours)      Critically ill patients:  140 - 180 mg/dL   Results for Clayton [name], Dequon K (MRN [id number]) as of 06/03/2015 09:51  Ref. Range 06/02/2015 08:28 06/02/2015 12:35 06/02/2015 15:58 06/02/2015 20:15  Glucose-Capillary Latest Ref Range: 65-99 mg/dL 259246 (H) 563194 (H) 875281 (H) 199 (H)    Admit with: Hypoxia/ Hypoglycemia  History: DM, CVA, CAD  SNF DM Meds: Lantus 45 units bid  Novolog 15 units breakfast/ 20 units lunch/ 15 units dinner  Metformin 1000 mg bid  Current Insulin Orders: Novolog Moderate SSI (0-15 units) TID AC     -Glucose levels have stabilized.  -Current A1c 5.7%.    MD- Please consider starting approximately 25% of patient's home dose of Lantus insulin-  Lantus 12 units bid      --Will follow patient during hospitalization--  Ambrose FinlandJeannine Johnston Anneliese Leblond RN, MSN, CDE Diabetes Coordinator Inpatient Glycemic Control Team Team Pager: [phone] (8a-5p)

## 2015-06-03 NOTE — Progress Notes (Signed)
  Echocardiogram 2D Echocardiogram has been performed.  Leta JunglingCooper, Clayton [name] 06/03/2015, 4:56 PM

## 2015-06-03 NOTE — Discharge Instructions (Signed)

## 2015-06-03 NOTE — Progress Notes (Addendum)
Pt HR up to 160's, sustaining for a couple minutes, then in low 100's, and back up. Pt asymptomatic. NP K. Schorr paged, orders received. Will continue to monitor pt, call bell in reach.  Claudie ReveringKatie Dunn, RN

## 2015-06-03 NOTE — Progress Notes (Signed)
Cardiac Monitoring Event  Dysrhythmia: Rapid Atrial Flutter  Symptoms: None  Level of Consciousness:  Alert and oriented  Last set of vital signs taken:  Temp: 98.2 F (36.8 C)  Pulse Rate: (!) 162  Resp: 16  BP: (!) 153/94 mmHg  SpO2: 98 %  Name of MD Notified:  Blake DivineAkula  Time MD Notified:  1405  Comments/Actions Taken:  Lopressor 5mg  given IV as per order (see MAR)

## 2015-06-03 NOTE — Progress Notes (Signed)
[facility] PROGRESS NOTE  Clayton [name] ZOX:[id number] DOB: 1956/06/17 DOA: 06/01/2015 PCP: [name], [name], PA-C    Assessment/Plan: Principal Problem:   Hypoglycemia Active Problems:   CAD (coronary artery disease)   DM type 2 with diabetic peripheral neuropathy (HCC)   GERD (gastroesophageal reflux disease)   Hypoxia   Bronchitis    Brief summary 59 year old male from independent living with multiple comorbidities,(see history of present illness), admitted for hypoglycemia, probable right lower lobe pneumonia, new onset atrial flutter  Assessment and plan New-onset atrial flutter Rate controlled, patient already on aspirin 81 mg/Plavix 75 mg a day Discussed with Dr. Jacinto HalimGanji, patient will need long-term anticoagulation, Ablation,  TSH is within normal limits.cardiac enzymes negative Chest x-ray shows possible pneumonia, he was started on IV antibitoics.  Acute hypoxemic respiratory failure-history of acute on chronic diastolic heart failure, patient takes Lasix 40 mg a day at home, chest x-ray shows mild pulmonary vascular congestion, probable developing pneumonia in the lingula.  Patient had good response to IV Lasix, diuresed about 6 liters.  Repeat 2-D echo results are pending.    Hypoglycemia Unclear etiology , likely in the setting of pneumonia, CBGs now elevated, stopped the IV fluids. continue Accu-Cheks, hemoglobin A1c 5.7 , diabetes coordinator consultation.   CAD (coronary artery disease)-continue beta blocker, aspirin, continue to cycle cardiac enzymes, slightly short of breath upon arrival likely secondary to pulmonary vascular congestion.    Acute on chronic diastolic heart failure, 2-D echo 08/11/14, EF 55-60%, continue Lasix 40 mg twice a day IV, the setting of hypoxemia, repeat ECHO.    DM type 2 with diabetic peripheral neuropathy (HCC)-restart lantus at a lower dose.  CBG (last 3)   Recent Labs  06/03/15 0746 06/03/15 1406 06/03/15 1639   GLUCAP 191* 183* 249*    hgba1c is 5.7.    GERD (gastroesophageal reflux disease)-continue PPI  DVT prophylaxsis Lovenox  Code Status:      Code Status Orders        Start     Ordered   06/01/15 1733  Full code   Continuous     06/01/15 1734     Family Communication:  None at bedside.  Disposition Plan:  As above       Consultants:  Cardiology  Procedures:  None  Antibiotics: Anti-infectives    Start     Dose/Rate Route Frequency Ordered Stop   06/02/15 1030  azithromycin (ZITHROMAX) 500 mg in dextrose 5 % 250 mL IVPB     500 mg 250 mL/hr over 60 Minutes Intravenous Every 24 hours 06/02/15 0838     06/02/15 1000  vancomycin (VANCOCIN) 2,000 mg in sodium chloride 0.9 % 500 mL IVPB  Status:  Discontinued     2,000 mg 250 mL/hr over 120 Minutes Intravenous  Once 06/02/15 0825 06/02/15 0837   06/02/15 1000  cefTRIAXone (ROCEPHIN) 1 g in dextrose 5 % 50 mL IVPB     1 g 100 mL/hr over 30 Minutes Intravenous Every 24 hours 06/02/15 0838     06/02/15 0900  piperacillin-tazobactam (ZOSYN) IVPB 3.375 g  Status:  Discontinued     3.375 g 100 mL/hr over 30 Minutes Intravenous  Once 06/02/15 0825 06/02/15 0837         HPI/Subjective: Patient is alert and oriented, denies any chest pain or shortness of breath Reports pain not well controlled.   Objective: Filed Vitals:   06/03/15 0501 06/03/15 0821 06/03/15 1349 06/03/15 1411  BP: 125/67  138/98 153/94  Pulse: 74  162 162  Temp: 98.2 F (36.8 C)     TempSrc: Oral     Resp: 16     Height:      Weight:      SpO2: 97% 98% 98%     Intake/Output Summary (Last 24 hours) at 06/03/15 1640 Last data filed at 06/03/15 1427  Gross per 24 hour  Intake    400 ml  Output   2450 ml  Net  -2050 ml    Exam:  General: No acute respiratory distress Lungs: Clear to auscultation bilaterally without wheezes or crackles Cardiovascular: Regular rate and rhythm without murmur gallop or rub normal S1 and S2 Abdomen:  Nontender, nondistended, soft, bowel sounds positive, no rebound, no ascites, no appreciable mass Extremities: Left, BKA, no clubbing, or edema bilateral lower extremities     Data Review   Micro Results Recent Results (from the past 240 hour(s))  MRSA PCR Screening     Status: None   Collection Time: 06/01/15  5:13 PM  Result Value Ref Range Status   MRSA by PCR NEGATIVE NEGATIVE Final    Comment:        The GeneXpert MRSA Assay (FDA approved for NASAL specimens only), is one component of a comprehensive MRSA colonization surveillance program. It is not intended to diagnose MRSA infection nor to guide or monitor treatment for MRSA infections.   Culture, blood (routine x 2)     Status: None (Preliminary result)   Collection Time: 06/02/15  8:56 AM  Result Value Ref Range Status   Specimen Description BLOOD RIGHT ARM  Final   Special Requests BOTTLES DRAWN AEROBIC AND ANAEROBIC 8CC  Final   Culture   Final    NO GROWTH 1 DAY Performed at [facility]    Report Status PENDING  Incomplete  Culture, blood (routine x 2)     Status: None (Preliminary result)   Collection Time: 06/02/15  9:10 AM  Result Value Ref Range Status   Specimen Description BLOOD RIGHT HAND  Final   Special Requests IN PEDIATRIC BOTTLE 3CC  Final   Culture   Final    NO GROWTH 1 DAY Performed at [facility]    Report Status PENDING  Incomplete    Radiology Reports Dg Chest 2 View  06/01/2015  CLINICAL DATA:  59 year old male with cough, congestion and hypoglycemia EXAM: CHEST  2 VIEW COMPARISON:  Prior chest x-ray 03/23/2015 FINDINGS: Stable cardiomegaly. Patient is status post median sternotomy with evidence of prior multivessel CABG. Mild pulmonary vascular congestion without overt edema is similar compared to prior imaging. No focal airspace consolidation, pleural effusion or pneumothorax. No suspicious pulmonary nodule or mass. No acute osseous abnormality. Chronic bronchitic  changes are also similar compared to prior. IMPRESSION: 1. Stable cardiomegaly and mild vascular congestion without overt edema. 2. No acute cardiopulmonary process. Electronically Signed   By: Malachy Moan M.D.   On: 06/01/2015 13:18   Nm Myocar Multi W/spect W/wall Motion / Ef  06/03/2015  CLINICAL DATA:  Chest pain EXAM: MYOCARDIAL IMAGING WITH SPECT (REST AND PHARMACOLOGIC-STRESS) GATED LEFT VENTRICULAR WALL MOTION STUDY LEFT VENTRICULAR EJECTION FRACTION TECHNIQUE: Standard myocardial SPECT imaging was performed after resting intravenous injection of 10 mCi Tc-62m sestamibi. Subsequently, intravenous infusion of Lexiscan was performed under the supervision of the Cardiology staff. At peak effect of the drug, 30 mCi Tc-10m sestamibi was injected intravenously and standard myocardial SPECT imaging was performed. Quantitative gated imaging was  also performed to evaluate left ventricular wall motion, and estimate left ventricular ejection fraction. COMPARISON:  None. FINDINGS: Perfusion: There is a large fixed defect in the apex and extending into the inferolateral wall. There is no stress-induced ischemia. Wall Motion: There is marked global hypokinesis with akinesis of the septum and inferior walls. Left Ventricular Ejection Fraction: 32 % End diastolic volume 102 ml End systolic volume 70 ml IMPRESSION: 1. No stress-induced ischemia. 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 2. Global hypokinesis with akinesis of the septum and inferior walls. 3. Left ventricular ejection fraction 32% 4. High-risk stress test findings*. *2012 Appropriate Use Criteria for Coronary Revascularization Focused Update: J Am Coll Cardiol. 2012;59(9):857-881. http://[email or web address]?[email or web address] Electronically Signed   By: Jolaine Click M.D.   On: 06/03/2015 13:41   Dg Chest Port 1 View  06/01/2015  CLINICAL DATA:  59 year old male with hypoglycemia, confusion and productive  cough EXAM: PORTABLE CHEST 1 VIEW COMPARISON:  Prior chest x-ray obtained earlier today FINDINGS: The patient is slightly rotated toward the right. Cardiac and mediastinal contours remain unchanged. Patient is status post median sternotomy with evidence of prior CABG. Developing patchy airspace opacity in the left lung base partially obscuring the cardiac margin. Otherwise, unchanged appearance of the lungs with chronic bronchitic change but otherwise clear. No acute osseous abnormality. IMPRESSION: 1. Developing patchy airspace opacity in the lingula may reflect atelectasis or developing infiltrate. 2. Otherwise, stable chest x-ray compared to earlier today. Electronically Signed   By: Malachy Moan M.D.   On: 06/01/2015 18:36     CBC  Recent Labs Lab 06/01/15 1235 06/03/15 0439  WBC 12.9* 7.9  HGB 12.5* 11.8*  HCT 39.1 37.3*  PLT 237 178  MCV 89.1 90.1  MCH 28.5 28.5  MCHC 32.0 31.6  RDW 16.2* 15.8*  LYMPHSABS 1.9  --   MONOABS 0.9  --   EOSABS 0.1  --   BASOSABS 0.0  --     Chemistries   Recent Labs Lab 06/01/15 1235 06/01/15 1756 06/02/15 0856 06/03/15 0439  NA 138  --  136 138  K 3.6  --  4.4 4.5  CL 98*  --  95* 97*  CO2 30  --  32 32  GLUCOSE 34*  --  199* 168*  BUN 28*  --  25* 26*  CREATININE 1.24  --  1.25* 1.19  CALCIUM 9.0  --  9.1 9.2  MG  --  1.4*  --   --   AST 19  --  14* 12*  ALT 14*  --  12* 12*  ALKPHOS 85  --  80 76  BILITOT 0.8  --  0.8 0.9   ------------------------------------------------------------------------------------------------------------------ estimated creatinine clearance is 78 mL/min (by C-G formula based on Cr of 1.19). ------------------------------------------------------------------------------------------------------------------  Recent Labs  06/01/15 1756  HGBA1C 5.7*   ------------------------------------------------------------------------------------------------------------------ No results for input(s): CHOL,  HDL, LDLCALC, TRIG, CHOLHDL, LDLDIRECT in the last 72 hours. ------------------------------------------------------------------------------------------------------------------  Recent Labs  06/03/15 0439  TSH 0.704   ------------------------------------------------------------------------------------------------------------------ No results for input(s): VITAMINB12, FOLATE, FERRITIN, TIBC, IRON, RETICCTPCT in the last 72 hours.  Coagulation profile No results for input(s): INR, PROTIME in the last 168 hours.  No results for input(s): DDIMER in the last 72 hours.  Cardiac Enzymes  Recent Labs Lab 06/01/15 1756 06/01/15 2300 06/02/15 1538  TROPONINI <0.03 <0.03 <0.03   ------------------------------------------------------------------------------------------------------------------ Invalid input(s): POCBNP   CBG:  Recent Labs Lab 06/02/15 1235 06/02/15 1558 06/02/15 2015 06/03/15 0746 06/03/15  1406  GLUCAP 194* 281* 199* 191* 183*       Studies: Nm Myocar Multi W/spect W/wall Motion / Ef  06/03/2015  CLINICAL DATA:  Chest pain EXAM: MYOCARDIAL IMAGING WITH SPECT (REST AND PHARMACOLOGIC-STRESS) GATED LEFT VENTRICULAR WALL MOTION STUDY LEFT VENTRICULAR EJECTION FRACTION TECHNIQUE: Standard myocardial SPECT imaging was performed after resting intravenous injection of 10 mCi Tc-20m sestamibi. Subsequently, intravenous infusion of Lexiscan was performed under the supervision of the Cardiology staff. At peak effect of the drug, 30 mCi Tc-10m sestamibi was injected intravenously and standard myocardial SPECT imaging was performed. Quantitative gated imaging was also performed to evaluate left ventricular wall motion, and estimate left ventricular ejection fraction. COMPARISON:  None. FINDINGS: Perfusion: There is a large fixed defect in the apex and extending into the inferolateral wall. There is no stress-induced ischemia. Wall Motion: There is marked global hypokinesis with  akinesis of the septum and inferior walls. Left Ventricular Ejection Fraction: 32 % End diastolic volume 102 ml End systolic volume 70 ml IMPRESSION: 1. No stress-induced ischemia. 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 2. Global hypokinesis with akinesis of the septum and inferior walls. 3. Left ventricular ejection fraction 32% 4. High-risk stress test findings*. *2012 Appropriate Use Criteria for Coronary Revascularization Focused Update: J Am Coll Cardiol. 2012;59(9):857-881. http://content.[email or web address]?articleid=[email or web address] Electronically Signed   By: Jolaine Click M.D.   On: 06/03/2015 13:41   Dg Chest Port 1 View  06/01/2015  CLINICAL DATA:  59 year old male with hypoglycemia, confusion and productive cough EXAM: PORTABLE CHEST 1 VIEW COMPARISON:  Prior chest x-ray obtained earlier today FINDINGS: The patient is slightly rotated toward the right. Cardiac and mediastinal contours remain unchanged. Patient is status post median sternotomy with evidence of prior CABG. Developing patchy airspace opacity in the left lung base partially obscuring the cardiac margin. Otherwise, unchanged appearance of the lungs with chronic bronchitic change but otherwise clear. No acute osseous abnormality. IMPRESSION: 1. Developing patchy airspace opacity in the lingula may reflect atelectasis or developing infiltrate. 2. Otherwise, stable chest x-ray compared to earlier today. Electronically Signed   By: Malachy Moan M.D.   On: 06/01/2015 18:36      Lab Results  Component Value Date   HGBA1C 5.7* 06/01/2015   HGBA1C 8.3* 08/11/2014   HGBA1C 8.3* 08/10/2014   Lab Results  Component Value Date   LDLCALC 59 08/10/2014   CREATININE 1.19 06/03/2015       Scheduled Meds: . antiseptic oral rinse  7 mL Mouth Rinse BID  . apixaban  5 mg Oral BID  . atorvastatin  40 mg Oral Daily  . azithromycin  500 mg Intravenous Q24H  . cefTRIAXone (ROCEPHIN)  IV  1 g Intravenous  Q24H  . cholecalciferol  2,000 Units Oral BID  . furosemide  40 mg Intravenous BID  . gabapentin  300 mg Oral QID  . insulin aspart  0-15 Units Subcutaneous TID WC  . ipratropium-albuterol  3 mL Nebulization TID  . metoprolol  25 mg Oral BID  . regadenoson  0.4 mg Intravenous Once  . sodium chloride  3 mL Intravenous Q12H  . triamcinolone cream   Topical QID   Continuous Infusions:    Principal Problem:   Hypoglycemia Active Problems:   CAD (coronary artery disease)   DM type 2 with diabetic peripheral neuropathy (HCC)   GERD (gastroesophageal reflux disease)   Hypoxia   Bronchitis    Time spent: 25 minutes   [facility]  [facility] Pager [phone]  If 7PM-7AM, please contact night-coverage at [email or web address], password [facility] 06/03/2015, 4:40 PM  LOS: 2 days

## 2015-06-04 LAB — BASIC METABOLIC PANEL
ANION GAP: 10 (ref 5–15)
BUN: 33 mg/dL — ABNORMAL HIGH (ref 6–20)
CO2: 32 mmol/L (ref 22–32)
CREATININE: 1.15 mg/dL (ref 0.61–1.24)
Calcium: 9.3 mg/dL (ref 8.9–10.3)
Chloride: 99 mmol/L — ABNORMAL LOW (ref 101–111)
GFR calc Af Amer: >60 mL/min (ref >60)
GFR calc non Af Amer: >60 mL/min (ref >60)
GLUCOSE: 206 mg/dL — AB (ref 65–99)
POTASSIUM: 4.2 mmol/L (ref 3.5–5.1)
Sodium: 141 mmol/L (ref 135–145)

## 2015-06-04 LAB — GLUCOSE, CAPILLARY
GLUCOSE-CAPILLARY: 298 mg/dL — AB (ref 65–99)
GLUCOSE-CAPILLARY: 185 mg/dL — AB (ref 65–99)
GLUCOSE-CAPILLARY: 252 mg/dL — AB (ref 65–99)
Glucose-Capillary: 172 mg/dL — ABNORMAL HIGH (ref 65–99)
Glucose-Capillary: 266 mg/dL — ABNORMAL HIGH (ref 65–99)

## 2015-06-04 LAB — MAGNESIUM: Magnesium: 1.9 mg/dL (ref 1.7–2.4)

## 2015-06-04 MED ORDER — LISINOPRIL 2.5 MG PO TABS
2.5000 mg | ORAL_TABLET | Freq: Every day | ORAL | Status: DC
Start: 2015-06-04 — End: 2015-06-05
  Administered 2015-06-04 – 2015-06-05 (×2): 2.5 mg via ORAL
  Filled 2015-06-04 (×2): qty 1

## 2015-06-04 MED ORDER — INSULIN GLARGINE 100 UNIT/ML ~~LOC~~ SOLN
15.0000 [IU] | Freq: Every day | SUBCUTANEOUS | Status: DC
  Administered 2015-06-05: 15 [IU] via SUBCUTANEOUS
  Filled 2015-06-04: qty 0.15

## 2015-06-04 NOTE — Progress Notes (Signed)
[facility] PROGRESS NOTE  Clayton [name] ZOX:[id number] DOB: 1955-09-17 DOA: 06/01/2015 PCP: [name], [name], PA-C    Assessment/Plan: Principal Problem:   Hypoglycemia Active Problems:   CAD (coronary artery disease)   DM type 2 with diabetic peripheral neuropathy (HCC)   GERD (gastroesophageal reflux disease)   Hypoxia   Bronchitis    Brief summary 59 year old male from independent living with multiple comorbidities,(see history of present illness), admitted for hypoglycemia, probable right lower lobe pneumonia, new onset atrial flutter  Assessment and plan New-onset atrial flutter Rate controlled, patient already on aspirin 81 mg/Plavix 75 mg a day Discussed with Dr. Jacinto Halim, patient will need long-term anticoagulation, Ablation,  TSH is within normal limits.cardiac enzymes negative Chest x-ray shows possible pneumonia, he was started on IV antibitoics.   Acute hypoxemic respiratory failure-history of acute on chronic diastolic heart failure, patient takes Lasix 40 mg a day at home, chest x-ray shows mild pulmonary vascular congestion, probable developing pneumonia in the lingula.  Patient had good response to IV Lasix, diuresed about 6 liters, isnce admission..  Repeat 2-D echo results show diffuse hypokinesis. Discussed with cardiology recommended follow up with EP as outpatient with ablation.    Hypoglycemia Unclear etiology , likely in the setting of pneumonia, CBGs now elevated, stopped the IV fluids. continue Accu-Cheks, hemoglobin A1c 5.7 , diabetes coordinator consultation. CBG (last 3)   Recent Labs  06/03/15 2155 06/04/15 0736 06/04/15 1210  GLUCAP 298* 172* 266*    Started on LOW dose lantus, increase it to 15 units today.    CAD (coronary artery disease)-continue beta blocker, aspirin, continue to cycle cardiac enzymes, slightly short of breath upon arrival likely secondary to pulmonary vascular congestion.    Acute on chronic diastolic heart  failure, 2-D echo 08/11/14, EF 55-60%, continue Lasix 40 mg twice a day IV, the setting of hypoxemia, repeat ECHO.    DM type 2 with diabetic peripheral neuropathy (HCC)-restart lantus at a lower dose.  CBG (last 3)   Recent Labs  06/03/15 2155 06/04/15 0736 06/04/15 1210  GLUCAP 298* 172* 266*    hgba1c is 5.7.    GERD (gastroesophageal reflux disease)-continue PPI  DVT prophylaxsis Lovenox  Code Status:      Code Status Orders        Start     Ordered   06/01/15 1733  Full code   Continuous     06/01/15 1734     Family Communication:  None at bedside.  Disposition Plan:  As above       Consultants:  Cardiology  Procedures:  None  Antibiotics: Anti-infectives    Start     Dose/Rate Route Frequency Ordered Stop   06/02/15 1030  azithromycin (ZITHROMAX) 500 mg in dextrose 5 % 250 mL IVPB     500 mg 250 mL/hr over 60 Minutes Intravenous Every 24 hours 06/02/15 0838     06/02/15 1000  vancomycin (VANCOCIN) 2,000 mg in sodium chloride 0.9 % 500 mL IVPB  Status:  Discontinued     2,000 mg 250 mL/hr over 120 Minutes Intravenous  Once 06/02/15 0825 06/02/15 0837   06/02/15 1000  cefTRIAXone (ROCEPHIN) 1 g in dextrose 5 % 50 mL IVPB     1 g 100 mL/hr over 30 Minutes Intravenous Every 24 hours 06/02/15 0838     06/02/15 0900  piperacillin-tazobactam (ZOSYN) IVPB 3.375 g  Status:  Discontinued     3.375 g 100 mL/hr over 30 Minutes Intravenous  Once 06/02/15 0825 06/02/15 0837         HPI/Subjective: Patient is alert and oriented, denies any chest pain or shortness of breath Reports pain not well controlled.   Objective: Filed Vitals:   06/03/15 2223 06/04/15 0524 06/04/15 0736 06/04/15 1300  BP:  126/86  137/90  Pulse: 110 74  78  Temp:  98.1 F (36.7 C)  98.2 F (36.8 C)  TempSrc:  Oral  Oral  Resp:  18  19  Height:      Weight:      SpO2:  94% 95% 96%    Intake/Output Summary (Last 24 hours) at 06/04/15 1609 Last data filed at 06/04/15  1300  Gross per 24 hour  Intake    300 ml  Output   2000 ml  Net  -1700 ml    Exam:  General: No acute respiratory distress Lungs: Clear to auscultation bilaterally without wheezes or crackles Cardiovascular: Regular rate and rhythm without murmur gallop or rub normal S1 and S2 Abdomen: Nontender, nondistended, soft, bowel sounds positive, no rebound, no ascites, no appreciable mass Extremities: Left, BKA, no clubbing, or edema bilateral lower extremities     Data Review   Micro Results Recent Results (from the past 240 hour(s))  MRSA PCR Screening     Status: None   Collection Time: 06/01/15  5:13 PM  Result Value Ref Range Status   MRSA by PCR NEGATIVE NEGATIVE Final    Comment:        The GeneXpert MRSA Assay (FDA approved for NASAL specimens only), is one component of a comprehensive MRSA colonization surveillance program. It is not intended to diagnose MRSA infection nor to guide or monitor treatment for MRSA infections.   Culture, blood (routine x 2)     Status: None (Preliminary result)   Collection Time: 06/02/15  8:56 AM  Result Value Ref Range Status   Specimen Description BLOOD RIGHT ARM  Final   Special Requests BOTTLES DRAWN AEROBIC AND ANAEROBIC 8CC  Final   Culture   Final    NO GROWTH 2 DAYS Performed at [facility]    Report Status PENDING  Incomplete  Culture, blood (routine x 2)     Status: None (Preliminary result)   Collection Time: 06/02/15  9:10 AM  Result Value Ref Range Status   Specimen Description BLOOD RIGHT HAND  Final   Special Requests IN PEDIATRIC BOTTLE 3CC  Final   Culture   Final    NO GROWTH 2 DAYS Performed at [facility]    Report Status PENDING  Incomplete    Radiology Reports Dg Chest 2 View  06/01/2015  CLINICAL DATA:  59 year old male with cough, congestion and hypoglycemia EXAM: CHEST  2 VIEW COMPARISON:  Prior chest x-ray 03/23/2015 FINDINGS: Stable cardiomegaly. Patient is status post median  sternotomy with evidence of prior multivessel CABG. Mild pulmonary vascular congestion without overt edema is similar compared to prior imaging. No focal airspace consolidation, pleural effusion or pneumothorax. No suspicious pulmonary nodule or mass. No acute osseous abnormality. Chronic bronchitic changes are also similar compared to prior. IMPRESSION: 1. Stable cardiomegaly and mild vascular congestion without overt edema. 2. No acute cardiopulmonary process. Electronically Signed   By: Malachy Moan M.D.   On: 06/01/2015 13:18   Nm Myocar Multi W/spect W/wall Motion / Ef  06/03/2015  CLINICAL DATA:  Chest pain EXAM: MYOCARDIAL IMAGING WITH SPECT (REST AND PHARMACOLOGIC-STRESS) GATED LEFT VENTRICULAR WALL MOTION STUDY LEFT VENTRICULAR EJECTION FRACTION  TECHNIQUE: Standard myocardial SPECT imaging was performed after resting intravenous injection of 10 mCi Tc-418m sestamibi. Subsequently, intravenous infusion of Lexiscan was performed under the supervision of the Cardiology staff. At peak effect of the drug, 30 mCi Tc-2618m sestamibi was injected intravenously and standard myocardial SPECT imaging was performed. Quantitative gated imaging was also performed to evaluate left ventricular wall motion, and estimate left ventricular ejection fraction. COMPARISON:  None. FINDINGS: Perfusion: There is a large fixed defect in the apex and extending into the inferolateral wall. There is no stress-induced ischemia. Wall Motion: There is marked global hypokinesis with akinesis of the septum and inferior walls. Left Ventricular Ejection Fraction: 32 % End diastolic volume 102 ml End systolic volume 70 ml IMPRESSION: 1. No stress-induced ischemia. 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 2. Global hypokinesis with akinesis of the septum and inferior walls. 3. Left ventricular ejection fraction 32% 4. High-risk stress test findings*. *2012 Appropriate Use Criteria for Coronary Revascularization  Focused Update: J Am Coll Cardiol. 2012;59(9):857-881. http://content.dementiazones.comonlinejacc.org/article.aspx?articleid=1201161 Electronically Signed   By: Jolaine ClickArthur  Hoss M.D.   On: 06/03/2015 13:41   Dg Chest Port 1 View  06/01/2015  CLINICAL DATA:  59 year old male with hypoglycemia, confusion and productive cough EXAM: PORTABLE CHEST 1 VIEW COMPARISON:  Prior chest x-ray obtained earlier today FINDINGS: The patient is slightly rotated toward the right. Cardiac and mediastinal contours remain unchanged. Patient is status post median sternotomy with evidence of prior CABG. Developing patchy airspace opacity in the left lung base partially obscuring the cardiac margin. Otherwise, unchanged appearance of the lungs with chronic bronchitic change but otherwise clear. No acute osseous abnormality. IMPRESSION: 1. Developing patchy airspace opacity in the lingula may reflect atelectasis or developing infiltrate. 2. Otherwise, stable chest x-ray compared to earlier today. Electronically Signed   By: Malachy MoanHeath  McCullough M.D.   On: 06/01/2015 18:36     CBC  Recent Labs Lab 06/01/15 1235 06/03/15 0439  WBC 12.9* 7.9  HGB 12.5* 11.8*  HCT 39.1 37.3*  PLT 237 178  MCV 89.1 90.1  MCH 28.5 28.5  MCHC 32.0 31.6  RDW 16.2* 15.8*  LYMPHSABS 1.9  --   MONOABS 0.9  --   EOSABS 0.1  --   BASOSABS 0.0  --     Chemistries   Recent Labs Lab 06/01/15 1235 06/01/15 1756 06/02/15 0856 06/03/15 0439 06/04/15 0443  NA 138  --  136 138 141  K 3.6  --  4.4 4.5 4.2  CL 98*  --  95* 97* 99*  CO2 30  --  32 32 32  GLUCOSE 34*  --  199* 168* 206*  BUN 28*  --  25* 26* 33*  CREATININE 1.24  --  1.25* 1.19 1.15  CALCIUM 9.0  --  9.1 9.2 9.3  MG  --  1.4*  --   --  1.9  AST 19  --  14* 12*  --   ALT 14*  --  12* 12*  --   ALKPHOS 85  --  80 76  --   BILITOT 0.8  --  0.8 0.9  --    ------------------------------------------------------------------------------------------------------------------ estimated creatinine  clearance is 80.7 mL/min (by C-G formula based on Cr of 1.15). ------------------------------------------------------------------------------------------------------------------  Recent Labs  06/01/15 1756  HGBA1C 5.7*   ------------------------------------------------------------------------------------------------------------------ No results for input(s): CHOL, HDL, LDLCALC, TRIG, CHOLHDL, LDLDIRECT in the last 72 hours. ------------------------------------------------------------------------------------------------------------------  Recent Labs  06/03/15 0439  TSH 0.704   ------------------------------------------------------------------------------------------------------------------ No results for  input(s): VITAMINB12, FOLATE, FERRITIN, TIBC, IRON, RETICCTPCT in the last 72 hours.  Coagulation profile No results for input(s): INR, PROTIME in the last 168 hours.  No results for input(s): DDIMER in the last 72 hours.  Cardiac Enzymes  Recent Labs Lab 06/01/15 1756 06/01/15 2300 06/02/15 1538  TROPONINI <0.03 <0.03 <0.03   ------------------------------------------------------------------------------------------------------------------ Invalid input(s): POCBNP   CBG:  Recent Labs Lab 06/03/15 1406 06/03/15 1639 06/03/15 2155 06/04/15 0736 06/04/15 1210  GLUCAP 183* 249* 298* 172* 266*       Studies: Nm Myocar Multi W/spect W/wall Motion / Ef  06/03/2015  CLINICAL DATA:  Chest pain EXAM: MYOCARDIAL IMAGING WITH SPECT (REST AND PHARMACOLOGIC-STRESS) GATED LEFT VENTRICULAR WALL MOTION STUDY LEFT VENTRICULAR EJECTION FRACTION TECHNIQUE: Standard myocardial SPECT imaging was performed after resting intravenous injection of 10 mCi Tc-76m sestamibi. Subsequently, intravenous infusion of Lexiscan was performed under the supervision of the Cardiology staff. At peak effect of the drug, 30 mCi Tc-9m sestamibi was injected intravenously and standard myocardial SPECT  imaging was performed. Quantitative gated imaging was also performed to evaluate left ventricular wall motion, and estimate left ventricular ejection fraction. COMPARISON:  None. FINDINGS: Perfusion: There is a large fixed defect in the apex and extending into the inferolateral wall. There is no stress-induced ischemia. Wall Motion: There is marked global hypokinesis with akinesis of the septum and inferior walls. Left Ventricular Ejection Fraction: 32 % End diastolic volume 102 ml End systolic volume 70 ml IMPRESSION: 1. No stress-induced ischemia. 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 2. Global hypokinesis with akinesis of the septum and inferior walls. 3. Left ventricular ejection fraction 32% 4. High-risk stress test findings*. *2012 Appropriate Use Criteria for Coronary Revascularization Focused Update: J Am Coll Cardiol. 2012;59(9):857-881. http://content.dementiazones.com.aspx?articleid=1201161 Electronically Signed   By: Jolaine Click M.D.   On: 06/03/2015 13:41      Lab Results  Component Value Date   HGBA1C 5.7* 06/01/2015   HGBA1C 8.3* 08/11/2014   HGBA1C 8.3* 08/10/2014   Lab Results  Component Value Date   LDLCALC 59 08/10/2014   CREATININE 1.15 06/04/2015       Scheduled Meds: . antiseptic oral rinse  7 mL Mouth Rinse BID  . apixaban  5 mg Oral BID  . atorvastatin  40 mg Oral Daily  . azithromycin  500 mg Intravenous Q24H  . cefTRIAXone (ROCEPHIN)  IV  1 g Intravenous Q24H  . cholecalciferol  2,000 Units Oral BID  . furosemide  40 mg Intravenous BID  . gabapentin  600 mg Oral QID  . insulin aspart  0-15 Units Subcutaneous TID WC  . insulin glargine  10 Units Subcutaneous Daily  . lisinopril  2.5 mg Oral Daily  . metoprolol  25 mg Oral BID  . regadenoson  0.4 mg Intravenous Once  . sodium chloride  3 mL Intravenous Q12H  . triamcinolone cream   Topical QID   Continuous Infusions:    Principal Problem:   Hypoglycemia Active  Problems:   CAD (coronary artery disease)   DM type 2 with diabetic peripheral neuropathy (HCC)   GERD (gastroesophageal reflux disease)   Hypoxia   Bronchitis    Time spent: 25 minutes   Olegario Emberson  [facility] Pager [phone]  If 7PM-7AM, please contact night-coverage at [email or web address], password [facility] 06/04/2015, 4:09 PM  LOS: 3 days

## 2015-06-04 NOTE — Progress Notes (Signed)
Subjective:  Sitting up eating breakfast. No new symptoms or concerns this morning.  Objective:  Vital Signs in the last 24 hours: Temp:  [98 F (36.7 C)-98.1 F (36.7 C)] 98.1 F (36.7 C) (11/24 0524) Pulse Rate:  [72-162] 74 (11/24 0524) Resp:  [18-19] 18 (11/24 0524) BP: (120-153)/(77-99) 126/86 mmHg (11/24 0524) SpO2:  [94 %-98 %] 95 % (11/24 0736)  Intake/Output from previous day: 11/23 0701 - 11/24 0700 In: 100 [IV Piggyback:100] Out: 1650 [Urine:1650]  Physical Exam: General appearance: alert, cooperative and no distress Neck: no adenopathy, no carotid bruit, no JVD, supple, symmetrical, trachea midline and thyroid not enlarged, symmetric, no tenderness/mass/nodules Lungs: clear to auscultation bilaterally Chest wall: no tenderness Heart: normal rate and rhythm irregular, S1, S2 normal, I/IV systolic murmur at aortic area, no click, rub or gallop Extremities: Left BKA, right metatarsal amputation Pulses: 2+ femoral pulses without bruit; absent popliteal pulse and right pedal pulse. Left BKA. Bilateral carotid bruit.    Lab Results: BMP  Recent Labs  06/02/15 0856 06/03/15 0439 06/04/15 0443  NA 136 138 141  K 4.4 4.5 4.2  CL 95* 97* 99*  CO2 32 32 32  GLUCOSE 199* 168* 206*  BUN 25* 26* 33*  CREATININE 1.25* 1.19 1.15  CALCIUM 9.1 9.2 9.3  GFRNONAA >60 >60 >60  GFRAA >60 >60 >60    CBC  Recent Labs Lab 06/01/15 1235 06/03/15 0439  WBC 12.9* 7.9  RBC 4.39 4.14*  HGB 12.5* 11.8*  HCT 39.1 37.3*  PLT 237 178  MCV 89.1 90.1  MCH 28.5 28.5  MCHC 32.0 31.6  RDW 16.2* 15.8*  LYMPHSABS 1.9  --   MONOABS 0.9  --   EOSABS 0.1  --   BASOSABS 0.0  --     HEMOGLOBIN A1C Lab Results  Component Value Date   HGBA1C 5.7* 06/01/2015   MPG 117 06/01/2015    Cardiac Panel (last 3 results)  Recent Labs  06/01/15 1756 06/01/15 2300 06/02/15 1538  TROPONINI <0.03 <0.03 <0.03    Recent Labs  08/10/14 0400  CHOL 128    Hepatic Function  Panel  Recent Labs  06/01/15 1235 06/02/15 0856 06/03/15 0439  PROT 8.4* 7.7 7.5  ALBUMIN 4.0 3.9 3.6  AST 19 14* 12*  ALT 14* 12* 12*  ALKPHOS 85 80 76  BILITOT 0.8 0.8 0.9    Imaging: Nm Myocar Multi W/spect W/wall Motion / Ef  06/03/2015  CLINICAL DATA:  Chest pain EXAM: MYOCARDIAL IMAGING WITH SPECT (REST AND PHARMACOLOGIC-STRESS) GATED LEFT VENTRICULAR WALL MOTION STUDY LEFT VENTRICULAR EJECTION FRACTION TECHNIQUE: Standard myocardial SPECT imaging was performed after resting intravenous injection of 10 mCi Tc-50m sestamibi. Subsequently, intravenous infusion of Lexiscan was performed under the supervision of the Cardiology staff. At peak effect of the drug, 30 mCi Tc-45m sestamibi was injected intravenously and standard myocardial SPECT imaging was performed. Quantitative gated imaging was also performed to evaluate left ventricular wall motion, and estimate left ventricular ejection fraction. COMPARISON:  None. FINDINGS: Perfusion: There is a large fixed defect in the apex and extending into the inferolateral wall. There is no stress-induced ischemia. Wall Motion: There is marked global hypokinesis with akinesis of the septum and inferior walls. Left Ventricular Ejection Fraction: 32 % End diastolic volume 102 ml End systolic volume 70 ml IMPRESSION: 1. No stress-induced ischemia. 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 2. Global hypokinesis with akinesis of the septum and inferior walls. 3. Left ventricular ejection fraction 32%  4. High-risk stress test findings*. *2012 Appropriate Use Criteria for Coronary Revascularization Focused Update: J Am Coll Cardiol. 2012;59(9):857-881. http://content.dementiazones.comonlinejacc.org/article.aspx?articleid=1201161 Electronically Signed   By: Jolaine ClickArthur  Hoss M.D.   On: 06/03/2015 13:41    Cardiac Studies:  EKG 06/01/2015: Atrial flutter with 4:1 conduction with ventricular rate of 74 bpm, normal axis, poor R-wave  progression  Echocardiogram 06/03/2015: Left ventricle: Poor image qualithy LV is dilated with severedecrease in LV function. Unable to assess RWMAs Aortic valve: Severely calcified with restricted motion. Unableto perform adequate CW doppler but suspect some degree of aortic stenosis Mitral valve: Severe MAC anterior / posterior trivial MR. Left atrium: The atrium was moderately to severely dilated.   Scheduled Meds: . antiseptic oral rinse  7 mL Mouth Rinse BID  . apixaban  5 mg Oral BID  . atorvastatin  40 mg Oral Daily  . azithromycin  500 mg Intravenous Q24H  . cefTRIAXone (ROCEPHIN)  IV  1 g Intravenous Q24H  . cholecalciferol  2,000 Units Oral BID  . furosemide  40 mg Intravenous BID  . gabapentin  600 mg Oral QID  . insulin aspart  0-15 Units Subcutaneous TID WC  . insulin glargine  10 Units Subcutaneous Daily  . ipratropium-albuterol  3 mL Nebulization TID  . lisinopril  2.5 mg Oral Daily  . metoprolol  25 mg Oral BID  . regadenoson  0.4 mg Intravenous Once  . sodium chloride  3 mL Intravenous Q12H  . triamcinolone cream   Topical QID   Continuous Infusions:  PRN Meds:.acetaminophen **OR** acetaminophen, alum & mag hydroxide-simeth, furosemide, HYDROcodone-acetaminophen, levalbuterol, ondansetron **OR** ondansetron (ZOFRAN) IV, senna-docusate, traMADol   Assessment/Plan:  1. Atrial flutter with variable AV conduction and RVR. (CHA2DS2-VASCScore: Risk Score 5, Yearly risk of stroke 6.7. OAC Has Bled: Score 2. Estimated risk of major bleeding at 1 year with OAC 1.88-3.2%) 2. Cardiomyopathy: echo reveals dilated LV with severe decrease in function; poor image quality, nuclear stress test revealing inferior wall scar with reduced ejection fraction, no ischemia. Suspect new onset cardiomyopathy with reduced EF is due to new onset of atrial flutter. 3. Pneumonia 4. H/O CVA 5. Carotid Artery Stenosis 6. CAD: CABG 27/21/2009 x4 (LIMA to mid LAD, SVG to OM1, and sequential  SVG to acute marginal and distal RCA. Proximal LAD 70% and severe diffuse disease distal LAD and high grade Cx and Occluded RCA with EF 35%). 6. Hypertension 7. Hyperlipidemia 8. DM 2 uncontrolled 9. Chronic pain syndrome.  Recommendation: Echocardiogram reveals depressed LVEF, new from prior echo in 07/2014, in which the ejection fraction is normal.. Would benefit from EP referral for ablation. Dr. Jacinto HalimGanji has spoken with Dr. Sharrell KuGreg Taylor and will arrange follow up. Continue Eliquis and aspirin 81 mg by mouth daily On BB. Resume lisinopril for .  Follo reduced ejection fraction. Follow-up outpatient in 2-3 weeks. If ejection fraction continues to remain low after atrial flutter ablation, then consider coronary angiography to exclude progression of coronary artery disease.   Erling Contellison,Bridgette Nicole, NP-C 06/04/2015, 9:52 AM [facility] Pager: [phone] Office: [phone]  I have personally reviewed the patient's record and performed physical exam and agree with the assessment and plan of Ms. Marcy SalvoBridgette Allison, NP-C.  Yates DecampGANJI, Atthew Coutant, MD 06/04/2015, 10:32 AM [facility] Pager: [phone] Office: [phone] If no answer: Cell:  [phone]

## 2015-06-05 ENCOUNTER — Inpatient Hospital Stay (HOSPITAL_COMMUNITY): Payer: Non-veteran care

## 2015-06-05 DIAGNOSIS — R0902 Hypoxemia: Secondary | ICD-10-CM

## 2015-06-05 LAB — GLUCOSE, CAPILLARY
GLUCOSE-CAPILLARY: 225 mg/dL — AB (ref 65–99)
GLUCOSE-CAPILLARY: 220 mg/dL — AB (ref 65–99)
Glucose-Capillary: 166 mg/dL — ABNORMAL HIGH (ref 65–99)

## 2015-06-05 MED ORDER — LEVOFLOXACIN 500 MG PO TABS: 500.0000 mg | ORAL_TABLET | Freq: Every day | ORAL | Status: DC

## 2015-06-05 MED ORDER — HYDROCODONE-ACETAMINOPHEN 5-325 MG PO TABS: 1.0000 | ORAL_TABLET | Freq: Three times a day (TID) | ORAL | Status: DC | PRN

## 2015-06-05 MED ORDER — INSULIN GLARGINE 100 UNIT/ML ~~LOC~~ SOLN: 15.0000 [IU] | Freq: Every day | SUBCUTANEOUS | Status: DC

## 2015-06-05 MED ORDER — APIXABAN 5 MG PO TABS: 5.0000 mg | ORAL_TABLET | Freq: Two times a day (BID) | ORAL | Status: DC

## 2015-06-05 MED ORDER — TRAMADOL HCL 50 MG PO TABS: 50.0000 mg | ORAL_TABLET | Freq: Four times a day (QID) | ORAL | Status: DC | PRN

## 2015-06-05 MED ORDER — AZITHROMYCIN 250 MG PO TABS
500.0000 mg | ORAL_TABLET | Freq: Every day | ORAL | Status: DC
Start: 2015-06-06 — End: 2015-06-05

## 2015-06-05 MED ORDER — ASPIRIN EC 81 MG PO TBEC: 81.0000 mg | DELAYED_RELEASE_TABLET | Freq: Every day | ORAL | Status: DC

## 2015-06-05 MED ORDER — FUROSEMIDE 40 MG PO TABS: 40.0000 mg | ORAL_TABLET | Freq: Every day | ORAL | Status: DC

## 2015-06-05 NOTE — Progress Notes (Signed)
PHARMACIST - PHYSICIAN COMMUNICATION DR:   Christena DeemZithromax CONCERNING: Antibiotic IV to Oral Route Change Policy  RECOMMENDATION: This patient is receiving Akula by the intravenous route.  Based on criteria approved by the Pharmacy and Therapeutics Committee, the antibiotic(s) is/are being converted to the equivalent oral dose form(s).   DESCRIPTION: These criteria include:  Patient being treated for a respiratory tract infection, urinary tract infection, cellulitis or clostridium difficile associated diarrhea if on metronidazole  The patient is not neutropenic and does not exhibit a GI malabsorption state  The patient is eating (either orally or via tube) and/or has been taking other orally administered medications for a least 24 hours  The patient is improving clinically and has a Tmax < 100.5  If you have questions about this conversion, please contact the Pharmacy Department  []   [phone]( [phone] )  [facility] []   [phone]( [phone] )  [facility] []   [phone]( [phone] )  [facility] []   [phone]  [facility] [x]   [phone]( [phone] )  [facility]   Junita PushMichelle Tashira Torre, PharmD, BCPS Pager: [phone] 06/05/2015@1 :47 PM

## 2015-06-05 NOTE — Evaluation (Signed)
Physical Therapy Evaluation Patient Details Name: Clayton [name] MRN: [id number] DOB: 1955-10-14 Today's Date: 06/05/2015   History of Present Illness  Pt is a 59 year old male admitted for hypoglycemia, probable right lower lobe pneumonia, new onset atrial flutter.  PMH: Multiple CVA's, CAD, MI, DM, neuropathy, HTN, HLD, Lt BKA 2013, Rt transmetatarsal amputation 2008  Clinical Impression  Pt admitted with above diagnosis. Pt currently with functional limitations due to the deficits listed below (see PT Problem List).  Pt will benefit from skilled PT to increase their independence and safety with mobility to allow discharge to the venue listed below.   Pt requiring mod assist for safety with transferring today.  Pt would benefit from SNF however he feels he would be okay to d/c back to ILF.     Follow Up Recommendations SNF    Equipment Recommendations  None recommended by PT    Recommendations for Other Services       Precautions / Restrictions Precautions Precautions: Fall Precaution Comments: needs shoes and prosthesis      Mobility  Bed Mobility Overal bed mobility: Needs Assistance Bed Mobility: Supine to Sit;Sit to Supine     Supine to sit: HOB elevated;Mod assist Sit to supine: Supervision;HOB elevated   General bed mobility comments: increased time and effort, assist for scooting out to EOB, pt reports he has more grab bar areas around his bed to assist himself with bed mobility, assist required to don prosthesis   Transfers Overall transfer level: Needs assistance Equipment used: None Transfers: Squat Pivot Transfers     Squat pivot transfers: Mod assist     General transfer comment: pt reports he usually does not use assistive device to transfer to his scooter however does use armrests, pt required mod assist to safely control descent due to pt turning while descending and almost missing surface  Ambulation/Gait                Stairs             Wheelchair Mobility    Modified Rankin (Stroke Patients Only)       Balance Overall balance assessment: Needs assistance Sitting-balance support: No upper extremity supported;Feet supported Sitting balance-Leahy Scale: Fair                                       Pertinent Vitals/Pain Pain Assessment: No/denies pain    Home Living   Living Arrangements: Alone Available Help at Discharge: Available PRN/intermittently (reports assist for bathing 2x/week) Type of Home: Independent living facility         Home Equipment: Dan Humphreys - 2 wheels;Shower seat;Bedside commode;Electric scooter;Wheelchair - manual;Grab bars - toilet;Grab bars - tub/shower;Hospital bed      Prior Function Level of Independence: Independent with assistive device(s)         Comments: pt states he can stand/squat pivot (described both) transfer to electric scooter without physical assist, states he can dress independently however reports assist for bathing 2x/week     Hand Dominance        Extremity/Trunk Assessment               Lower Extremity Assessment: Generalized weakness;RLE deficits/detail;LLE deficits/detail RLE Deficits / Details: transmet amputation LLE Deficits / Details: L BKA, also reports residual weakness on this side from CVA     Communication   Communication: Expressive difficulties (dysarthria)  Cognition Arousal/Alertness: Awake/alert  Behavior During Therapy: WFL for tasks assessed/performed Overall Cognitive Status: No family/caregiver present to determine baseline cognitive functioning                 General Comments: slowly answers questions    General Comments      Exercises        Assessment/Plan    PT Assessment Patient needs continued PT services  PT Diagnosis Generalized weakness   PT Problem List Decreased strength;Decreased mobility;Decreased balance;Decreased activity tolerance;Decreased safety awareness  PT Treatment  Interventions DME instruction;Functional mobility training;Patient/family education;Therapeutic activities;Therapeutic exercise;Balance training   PT Goals (Current goals can be found in the Care Plan section) Acute Rehab PT Goals PT Goal Formulation: With patient Time For Goal Achievement: 06/12/15 Potential to Achieve Goals: Good    Frequency Min 2X/week   Barriers to discharge        Co-evaluation               End of Session   Activity Tolerance: Patient limited by fatigue Patient left: in bed;with call bell/phone within reach;with bed alarm set Nurse Communication: Mobility status         Time: [id number] PT Time Calculation (min) (ACUTE ONLY): 22 min   Charges:   PT Evaluation $Initial PT Evaluation Tier I: 1 Procedure     PT G Codes:        Clayton [name],Clayton [name] 06/05/2015, 3:25 PM Clayton [name], PT, DPT 06/05/2015 Pager: [phone]

## 2015-06-05 NOTE — Progress Notes (Signed)
Pt for discharge back to [facility]. Pt states that he has HHPT at ILF and does not feel SNF is needed upon discharge.  CSW received notification that pt may need transportation home. CSW met with pt at bedside who reports that he spoke with pt sister who is arranging to come pick pt up from the hospital. CSW confirmed address and notified pt that if pt family cannot transport then only option would be ambulance transport. Pt will notify RN if something fall through with pt family transporting.   CSW provided RN with documents needed for transport if it is needed. RN to arrange ambulance transport if needed by calling if needed before 5 pm, call [phone]. If transport needed after 5 pm, call [phone] option 1 for English and then option 3 for non-emergency transport then provide the information asked for transport.   No further social work needs identified at this time.  CSW signing off.   Alison Murray, MSW, LCSW Clinical Social Work Coverage for [employer], [place]

## 2015-06-05 NOTE — Progress Notes (Signed)
[facility] called for transport  [employer]oke M. Clelia CroftShaw, RN

## 2015-06-05 NOTE — Progress Notes (Signed)
Reviewed discharge information with patient. Answered all questions. Patient able to teach back medications and reasons to contact MD/911. Patient verbalizes importance of PCP follow up appointment.  Addie Cederberg M. Judy Pollman, RN   

## 2015-06-05 NOTE — Progress Notes (Signed)
Spoke with pt concerning HHPT. Pt states that he has HHPT at his home. [facility] [place].

## 2015-06-05 NOTE — Discharge Summary (Signed)
Physician Discharge Summary  Clayton [name] JXB:[id number] DOB: [birth year] DOA: 06/01/2015  PCP: Clayton [name], [name], PA-C  Admit date: 06/01/2015 Discharge date: 06/05/2015  Time spent: 25 minutes  Recommendations for Outpatient Follow-up:  1. Please follow up with Dr Clayton [name] EP follow up for ablation.    Discharge Diagnoses:  Principal Problem:   Hypoglycemia Active Problems:   CAD (coronary artery disease)   DM type 2 with diabetic peripheral neuropathy (HCC)   GERD (gastroesophageal reflux disease)   Hypoxia   Bronchitis   Discharge Condition: improved  Diet recommendation: carb modified diet.   Filed Weights   06/01/15 1700  Weight: 109 kg (240 lb 4.8 oz)    History of present illness:  59 year old male from independent living with multiple comorbidities,(see history of present illness), admitted for hypoglycemia, probable right lower lobe pneumonia, new onset atrial flutter  Hospital Course:  New-onset atrial flutter Rate controlled, patient already on aspirin 81 mg/Plavix 75 mg a day Discussed with Dr. Jacinto [name], patient will need long-term anticoagulation, Ablation, TSH is within normal limits.cardiac enzymes negative  Pneumonia.  Chest x-ray shows possible pneumonia, he was started on IV antibitoics. Repeat CXR shows improvement, he was discharged on oral antibiotics to complete the course.   Acute hypoxemic respiratory failure-history of acute on chronic diastolic heart failure, patient takes Lasix 40 mg a day at home, chest x-ray shows mild pulmonary vascular congestion, probable developing pneumonia in the lingula.  Patient had good response to IV Lasix, diuresed about 6 liters, since admission..  Repeat 2-D echo results show diffuse hypokinesis. Discussed with cardiology recommended follow up with EP as outpatient with ablation.    Hypoglycemia Unclear etiology , likely in the setting of pneumonia, CBGs now elevated, stopped the IV fluids. continue Accu-Cheks,  hemoglobin A1c 5.7 , diabetes coordinator consultation. CBG (last 3)   Recent Labs (last 2 labs)      Recent Labs  06/03/15 2155 06/04/15 0736 06/04/15 1210  GLUCAP 298* 172* 266*      Started on LOW dose lantus, increase it to 15 units  On discharge.    CAD (coronary artery disease)-continue beta blocker, aspirin,, slightly short of breath upon arrival likely secondary to pulmonary vascular congestion. He was started on IV lasix, diuresed, weaned off oxygen and stable on discharge.    Acute on chronic diastolic heart failure, 2-D echo 08/11/14, EF 55-60%, diuresed appropriately and changed to po lasix on discharge.     DM type 2 with diabetic peripheral neuropathy (HCC)-restarted lantus at a lower dose.  CBG (last 3)   Recent Labs (last 2 labs)      Recent Labs  06/03/15 2155 06/04/15 0736 06/04/15 1210  GLUCAP 298* 172* 266*      hgba1c is 5.7. Changed the regimen of the insulin on discharge.    GERD (gastroesophageal reflux disease)-continue PPI       Procedures:  none  Consultations:  none  Discharge Exam: Filed Vitals:   06/05/15 0552 06/05/15 1403  BP: 103/66 118/67  Pulse: 62 68  Temp: 97.7 F (36.5 C) 97.4 F (36.3 C)  Resp: 18 18    General: alert afebrile comfortable.  Cardiovascular: s1s2 Respiratory: ctab  Discharge Instructions   Discharge Instructions    Diet - low sodium heart healthy    Complete by:  As directed      Discharge instructions    Complete by:  As directed   Follow up with Dr Clayton [name] and Dr Clayton [name] as recommended.  Please follow  up with home health PT and RN as recommended.          Current Discharge Medication List    START taking these medications   Details  apixaban (ELIQUIS) 5 MG TABS tablet Take 1 tablet (5 mg total) by mouth 2 (two) times daily. Qty: 60 tablet, Refills: 0    levofloxacin (LEVAQUIN) 500 MG tablet Take 1 tablet (500 mg total) by mouth daily. Qty: 4 tablet,  Refills: 0    traMADol (ULTRAM) 50 MG tablet Take 1 tablet (50 mg total) by mouth every 6 (six) hours as needed for moderate pain. Qty: 15 tablet, Refills: 0      CONTINUE these medications which have CHANGED   Details  aspirin EC 81 MG tablet Take 1 tablet (81 mg total) by mouth daily. Qty: 30 tablet, Refills: 0    furosemide (LASIX) 40 MG tablet Take 1 tablet (40 mg total) by mouth daily. Qty: 30 tablet, Refills: 0    HYDROcodone-acetaminophen (NORCO/VICODIN) 5-325 MG tablet Take 1 tablet by mouth 3 (three) times daily as needed for moderate pain. Qty: 10 tablet, Refills: 0    insulin glargine (LANTUS) 100 UNIT/ML injection Inject 0.15 mLs (15 Units total) into the skin daily. Qty: 10 mL, Refills: 11      CONTINUE these medications which have NOT CHANGED   Details  albuterol (PROVENTIL HFA;VENTOLIN HFA) 108 (90 BASE) MCG/ACT inhaler Inhale 1-2 puffs into the lungs every 6 (six) hours as needed for wheezing or shortness of breath.    atorvastatin (LIPITOR) 40 MG tablet Take 40 mg by mouth daily.    cholecalciferol (VITAMIN D) 1000 UNITS tablet Take 2,000 Units by mouth 2 (two) times daily.    gabapentin (NEURONTIN) 600 MG tablet Take 600 mg by mouth 4 (four) times daily.     insulin aspart (NOVOLOG) 100 UNIT/ML injection Inject 0-15 Units into the skin 3 (three) times daily with meals. Qty: 10 mL, Refills: 11    lisinopril (PRINIVIL,ZESTRIL) 2.5 MG tablet Take 2.5 mg by mouth daily.    metFORMIN (GLUCOPHAGE) 1000 MG tablet Take 1,000 mg by mouth 2 (two) times daily with a meal.    metoprolol (LOPRESSOR) 50 MG tablet Take 25 mg by mouth 2 (two) times daily.    Multiple Vitamin (MULTIVITAMIN WITH MINERALS) TABS tablet Take 1 tablet by mouth daily.      STOP taking these medications     clopidogrel (PLAVIX) 75 MG tablet      gabapentin (NEURONTIN) 300 MG capsule      doxycycline (VIBRAMYCIN) 100 MG capsule        Allergies  Allergen Reactions  . Niaspan [Niacin  Er] Other (See Comments)    Face was red and hot   Follow-up Information    Follow up with DYER, [name], PA-C. Schedule an appointment as soon as possible for a visit in 1 week.   Specialty:  Physician Assistant   Contact information:   [address] [place] [address] [phone]       Follow up with Yates Decamp, MD. Schedule an appointment as soon as possible for a visit in 2 weeks.   Specialty:  Cardiology   Contact information:   [address] [place] [address] [phone]       Follow up with Lewayne Bunting, MD. Schedule an appointment as soon as possible for a visit in 1 week.   Specialty:  Cardiology   Contact information:   [address]  [address] [place] [address] [phone]       Follow up with [facility]. Schedule an appointment as soon as possible for a visit in 1 week.   Contact information:   [address] [place] [address] [place] [address] [phone]       The results of significant diagnostics from this hospitalization (including imaging, microbiology, ancillary and laboratory) are listed below for reference.    Significant Diagnostic Studies: Dg Chest 2 View  06/01/2015  CLINICAL DATA:  59 year old male with cough, congestion and hypoglycemia EXAM: CHEST  2 VIEW COMPARISON:  Prior chest x-ray 03/23/2015 FINDINGS: Stable cardiomegaly. Patient is status post median sternotomy with evidence of prior multivessel CABG. Mild pulmonary vascular congestion without overt edema is similar compared to prior imaging. No focal airspace consolidation, pleural effusion or pneumothorax. No suspicious pulmonary nodule or mass. No acute osseous abnormality. Chronic bronchitic changes are also similar compared to prior. IMPRESSION: 1. Stable cardiomegaly and mild vascular congestion without overt edema. 2. No acute cardiopulmonary process. Electronically Signed   By: Malachy Moan M.D.   On: 06/01/2015  13:18   Nm Myocar Multi W/spect W/wall Motion / Ef  06/03/2015  CLINICAL DATA:  Chest pain EXAM: MYOCARDIAL IMAGING WITH SPECT (REST AND PHARMACOLOGIC-STRESS) GATED LEFT VENTRICULAR WALL MOTION STUDY LEFT VENTRICULAR EJECTION FRACTION TECHNIQUE: Standard myocardial SPECT imaging was performed after resting intravenous injection of 10 mCi Tc-12m sestamibi. Subsequently, intravenous infusion of Lexiscan was performed under the supervision of the Cardiology staff. At peak effect of the drug, 30 mCi Tc-19m sestamibi was injected intravenously and standard myocardial SPECT imaging was performed. Quantitative gated imaging was also performed to evaluate left ventricular wall motion, and estimate left ventricular ejection fraction. COMPARISON:  None. FINDINGS: Perfusion: There is a large fixed defect in the apex and extending into the inferolateral wall. There is no stress-induced ischemia. Wall Motion: There is marked global hypokinesis with akinesis of the septum and inferior walls. Left Ventricular Ejection Fraction: 32 % End diastolic volume 102 ml End systolic volume 70 ml IMPRESSION: 1. No stress-induced ischemia. 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 2. Global hypokinesis with akinesis of the septum and inferior walls. 3. Left ventricular ejection fraction 32% 4. High-risk stress test findings*. *2012 Appropriate Use Criteria for Coronary Revascularization Focused Update: J Am Coll Cardiol. 2012;59(9):857-881. http://content.dementiazones.com.aspx?articleid=[email or web address] Electronically Signed   By: Jolaine Click M.D.   On: 06/03/2015 13:41   Dg Chest Port 1 View  06/05/2015  CLINICAL DATA:  Coronary artery disease. EXAM: PORTABLE CHEST 1 VIEW COMPARISON:  06/01/2015. FINDINGS: Prior CABG. Cardiomegaly. No focal infiltrate. No pleural effusion or pneumothorax. No acute bony abnormality . IMPRESSION: 1. Prior CABG.  Cardiomegaly. 2. No focal pulmonary infiltrate. Interim clearing  of lingular infiltrate. Electronically Signed   By: Maisie Fus  Register   On: 06/05/2015 07:23   Dg Chest Port 1 View  06/01/2015  CLINICAL DATA:  59 year old male with hypoglycemia, confusion and productive cough EXAM: PORTABLE CHEST 1 VIEW COMPARISON:  Prior chest x-ray obtained earlier today FINDINGS: The patient is slightly rotated toward the right. Cardiac and mediastinal contours remain unchanged. Patient is status post median sternotomy with evidence of prior CABG. Developing patchy airspace opacity in the left lung base partially obscuring the cardiac margin. Otherwise, unchanged appearance of the lungs with chronic bronchitic change but otherwise clear. No acute osseous abnormality. IMPRESSION: 1. Developing patchy airspace opacity in the lingula may reflect atelectasis or developing infiltrate. 2. Otherwise, stable  chest x-ray compared to earlier today. Electronically Signed   By: Malachy MoanHeath  McCullough M.D.   On: 06/01/2015 18:36    Microbiology: Recent Results (from the past 240 hour(s))  MRSA PCR Screening     Status: None   Collection Time: 06/01/15  5:13 PM  Result Value Ref Range Status   MRSA by PCR NEGATIVE NEGATIVE Final    Comment:        The GeneXpert MRSA Assay (FDA approved for NASAL specimens only), is one component of a comprehensive MRSA colonization surveillance program. It is not intended to diagnose MRSA infection nor to guide or monitor treatment for MRSA infections.   Culture, blood (routine x 2)     Status: None (Preliminary result)   Collection Time: 06/02/15  8:56 AM  Result Value Ref Range Status   Specimen Description BLOOD RIGHT ARM  Final   Special Requests BOTTLES DRAWN AEROBIC AND ANAEROBIC 8CC  Final   Culture   Final    NO GROWTH 3 DAYS Performed at [facility]    Report Status PENDING  Incomplete  Culture, blood (routine x 2)     Status: None (Preliminary result)   Collection Time: 06/02/15  9:10 AM  Result Value Ref Range Status    Specimen Description BLOOD RIGHT HAND  Final   Special Requests IN PEDIATRIC BOTTLE 3CC  Final   Culture   Final    NO GROWTH 3 DAYS Performed at [facility]    Report Status PENDING  Incomplete     Labs: Basic Metabolic Panel:  Recent Labs Lab 06/01/15 1235 06/01/15 1756 06/02/15 0856 06/03/15 0439 06/04/15 0443  NA 138  --  136 138 141  K 3.6  --  4.4 4.5 4.2  CL 98*  --  95* 97* 99*  CO2 30  --  32 32 32  GLUCOSE 34*  --  199* 168* 206*  BUN 28*  --  25* 26* 33*  CREATININE 1.24  --  1.25* 1.19 1.15  CALCIUM 9.0  --  9.1 9.2 9.3  MG  --  1.4*  --   --  1.9   Liver Function Tests:  Recent Labs Lab 06/01/15 1235 06/02/15 0856 06/03/15 0439  AST 19 14* 12*  ALT 14* 12* 12*  ALKPHOS 85 80 76  BILITOT 0.8 0.8 0.9  PROT 8.4* 7.7 7.5  ALBUMIN 4.0 3.9 3.6   No results for input(s): LIPASE, AMYLASE in the last 168 hours. No results for input(s): AMMONIA in the last 168 hours. CBC:  Recent Labs Lab 06/01/15 1235 06/03/15 0439  WBC 12.9* 7.9  NEUTROABS 10.0*  --   HGB 12.5* 11.8*  HCT 39.1 37.3*  MCV 89.1 90.1  PLT 237 178   Cardiac Enzymes:  Recent Labs Lab 06/01/15 1756 06/01/15 2300 06/02/15 1538  TROPONINI <0.03 <0.03 <0.03   BNP: BNP (last 3 results)  Recent Labs  03/23/15 1630 06/01/15 1235  BNP 112.3* 58.5    ProBNP (last 3 results) No results for input(s): PROBNP in the last 8760 hours.  CBG:  Recent Labs Lab 06/04/15 1210 06/04/15 1651 06/04/15 2122 06/05/15 0736 06/05/15 1143  GLUCAP 266* 185* 252* 166* 225*       Signed:  Loletha Bertini  [facility] 06/05/2015, 3:46 PM

## 2015-06-05 NOTE — Progress Notes (Signed)
ANTICOAGULATION CONSULT NOTE  Pharmacy Consult for Apixaban Indication: atrial flutter  Allergies  Allergen Reactions  . Niaspan [Niacin Er] Other (See Comments)    Face was red and hot    Patient Measurements: Height: 5\' 8"  (172.7 cm) Weight: 240 lb 4.8 oz (109 kg) IBW/kg (Calculated) : 68.4   Vital Signs: Temp: 97.7 F (36.5 C) (11/25 0552) Temp Source: Oral (11/25 0552) BP: 103/66 mmHg (11/25 0552) Pulse Rate: 62 (11/25 0552)  Labs:  Recent Labs  06/02/15 1538 06/03/15 0439 06/04/15 0443  HGB  --  11.8*  --   HCT  --  37.3*  --   PLT  --  178  --   CREATININE  --  1.19 1.15  TROPONINI <0.03  --   --     Estimated Creatinine Clearance: 80.7 mL/min (by C-G formula based on Cr of 1.15).   Medical History: Past Medical History  Diagnosis Date  . Stroke (HCC)   . Coronary artery disease   . Diabetes mellitus without complication (HCC)   . Hypertension   . MI (myocardial infarction) (HCC)   . Hyperlipidemia   . GERD (gastroesophageal reflux disease)   . Diabetic neuropathy (HCC)   . Diabetic retinopathy [facility](HCC)      Assessment: [age] yoM with h/o CAD s/p MD, s/p CABG, DM, HTN, HLD, CVA, signficant carotid artery and peripheral artery disease s/p left BKA and right transmetatarsal amputation who presented on 06/01/2015 via EMS from skilled nursing facility with hypoglycemia, admitted with possible pneumonia and found new onset atrial flutter with RVR. Cardiology consulted and recommended apixaban (CHA2DS2-VASc score = 5) x 3 weeks and then possible ablation, aspirin and plavix were discontinued.    - weight 109 kg - CrCl~80 ml/min - patient does not meet criteria for dose reduction - Hgb, pltc wnl- stable - No bleeding reported  Goal of Therapy:  Prevention of stroke and systemic embolism   Plan:  1.  Continue apixaban 5 mg PO BID.   2.  Apixaban education completed. 3.  Monitor SCr, CBC. 4.  No further dose adjustments anticipated.  Pharmacy will sign  off.  Please re-consult if clinical status changes.   Elson ClanLilliston, Maimuna Leaman Michelle 06/05/2015,11:02 AM

## 2015-06-07 LAB — CULTURE, BLOOD (ROUTINE X 2)
CULTURE: NO GROWTH
Culture: NO GROWTH

## 2015-06-08 ENCOUNTER — Other Ambulatory Visit: Payer: Self-pay

## 2015-06-08 ENCOUNTER — Emergency Department (HOSPITAL_COMMUNITY): Payer: Medicare HMO

## 2015-06-08 ENCOUNTER — Inpatient Hospital Stay (HOSPITAL_COMMUNITY)
Admission: EM | Admit: 2015-06-08 | Discharge: 2015-06-13 | DRG: 871 | Disposition: A | Discharge status: Skilled Nursing Facility | Payer: Medicare HMO | Attending: Internal Medicine | Admitting: Internal Medicine

## 2015-06-08 ENCOUNTER — Encounter (HOSPITAL_COMMUNITY): Payer: Self-pay | Admitting: Family Medicine

## 2015-06-08 DIAGNOSIS — K219 Gastro-esophageal reflux disease without esophagitis: Secondary | ICD-10-CM | POA: Diagnosis present

## 2015-06-08 DIAGNOSIS — I5021 Acute systolic (congestive) heart failure: Secondary | ICD-10-CM | POA: Diagnosis not present

## 2015-06-08 DIAGNOSIS — I471 Supraventricular tachycardia: Secondary | ICD-10-CM | POA: Diagnosis not present

## 2015-06-08 DIAGNOSIS — Z79899 Other long term (current) drug therapy: Secondary | ICD-10-CM | POA: Diagnosis not present

## 2015-06-08 DIAGNOSIS — E11649 Type 2 diabetes mellitus with hypoglycemia without coma: Secondary | ICD-10-CM | POA: Diagnosis present

## 2015-06-08 DIAGNOSIS — Z794 Long term (current) use of insulin: Secondary | ICD-10-CM

## 2015-06-08 DIAGNOSIS — Z89511 Acquired absence of right leg below knee: Secondary | ICD-10-CM | POA: Diagnosis not present

## 2015-06-08 DIAGNOSIS — N390 Urinary tract infection, site not specified: Secondary | ICD-10-CM | POA: Diagnosis present

## 2015-06-08 DIAGNOSIS — D62 Acute posthemorrhagic anemia: Secondary | ICD-10-CM | POA: Diagnosis present

## 2015-06-08 DIAGNOSIS — D696 Thrombocytopenia, unspecified: Secondary | ICD-10-CM | POA: Diagnosis present

## 2015-06-08 DIAGNOSIS — R1310 Dysphagia, unspecified: Secondary | ICD-10-CM | POA: Diagnosis present

## 2015-06-08 DIAGNOSIS — I5042 Chronic combined systolic (congestive) and diastolic (congestive) heart failure: Secondary | ICD-10-CM | POA: Diagnosis present

## 2015-06-08 DIAGNOSIS — Z6836 Body mass index (BMI) 36.0-36.9, adult: Secondary | ICD-10-CM

## 2015-06-08 DIAGNOSIS — E162 Hypoglycemia, unspecified: Secondary | ICD-10-CM | POA: Diagnosis present

## 2015-06-08 DIAGNOSIS — I252 Old myocardial infarction: Secondary | ICD-10-CM | POA: Diagnosis not present

## 2015-06-08 DIAGNOSIS — I5032 Chronic diastolic (congestive) heart failure: Secondary | ICD-10-CM | POA: Diagnosis present

## 2015-06-08 DIAGNOSIS — I483 Typical atrial flutter: Secondary | ICD-10-CM | POA: Diagnosis not present

## 2015-06-08 DIAGNOSIS — E11319 Type 2 diabetes mellitus with unspecified diabetic retinopathy without macular edema: Secondary | ICD-10-CM | POA: Diagnosis present

## 2015-06-08 DIAGNOSIS — I10 Essential (primary) hypertension: Secondary | ICD-10-CM | POA: Diagnosis present

## 2015-06-08 DIAGNOSIS — Z87891 Personal history of nicotine dependence: Secondary | ICD-10-CM | POA: Diagnosis not present

## 2015-06-08 DIAGNOSIS — B3749 Other urogenital candidiasis: Secondary | ICD-10-CM | POA: Diagnosis present

## 2015-06-08 DIAGNOSIS — I951 Orthostatic hypotension: Secondary | ICD-10-CM | POA: Diagnosis not present

## 2015-06-08 DIAGNOSIS — Z7901 Long term (current) use of anticoagulants: Secondary | ICD-10-CM | POA: Diagnosis not present

## 2015-06-08 DIAGNOSIS — G9340 Encephalopathy, unspecified: Secondary | ICD-10-CM | POA: Diagnosis present

## 2015-06-08 DIAGNOSIS — T83511A Infection and inflammatory reaction due to indwelling urethral catheter, initial encounter: Secondary | ICD-10-CM | POA: Diagnosis not present

## 2015-06-08 DIAGNOSIS — Z8673 Personal history of transient ischemic attack (TIA), and cerebral infarction without residual deficits: Secondary | ICD-10-CM | POA: Diagnosis not present

## 2015-06-08 DIAGNOSIS — E1142 Type 2 diabetes mellitus with diabetic polyneuropathy: Secondary | ICD-10-CM | POA: Diagnosis present

## 2015-06-08 DIAGNOSIS — Z89512 Acquired absence of left leg below knee: Secondary | ICD-10-CM

## 2015-06-08 DIAGNOSIS — R6521 Severe sepsis with septic shock: Secondary | ICD-10-CM | POA: Diagnosis present

## 2015-06-08 DIAGNOSIS — E785 Hyperlipidemia, unspecified: Secondary | ICD-10-CM | POA: Diagnosis present

## 2015-06-08 DIAGNOSIS — N179 Acute kidney failure, unspecified: Secondary | ICD-10-CM | POA: Diagnosis present

## 2015-06-08 DIAGNOSIS — I4892 Unspecified atrial flutter: Secondary | ICD-10-CM | POA: Diagnosis present

## 2015-06-08 DIAGNOSIS — I2510 Atherosclerotic heart disease of native coronary artery without angina pectoris: Secondary | ICD-10-CM | POA: Diagnosis present

## 2015-06-08 DIAGNOSIS — T17998A Other foreign object in respiratory tract, part unspecified causing other injury, initial encounter: Secondary | ICD-10-CM

## 2015-06-08 DIAGNOSIS — J189 Pneumonia, unspecified organism: Secondary | ICD-10-CM | POA: Diagnosis present

## 2015-06-08 DIAGNOSIS — E1165 Type 2 diabetes mellitus with hyperglycemia: Secondary | ICD-10-CM | POA: Diagnosis present

## 2015-06-08 DIAGNOSIS — I9589 Other hypotension: Secondary | ICD-10-CM | POA: Diagnosis not present

## 2015-06-08 DIAGNOSIS — R0602 Shortness of breath: Secondary | ICD-10-CM | POA: Diagnosis not present

## 2015-06-08 DIAGNOSIS — Z888 Allergy status to other drugs, medicaments and biological substances status: Secondary | ICD-10-CM

## 2015-06-08 DIAGNOSIS — I959 Hypotension, unspecified: Secondary | ICD-10-CM | POA: Diagnosis not present

## 2015-06-08 DIAGNOSIS — R579 Shock, unspecified: Secondary | ICD-10-CM | POA: Diagnosis not present

## 2015-06-08 DIAGNOSIS — A419 Sepsis, unspecified organism: Secondary | ICD-10-CM | POA: Diagnosis present

## 2015-06-08 DIAGNOSIS — I429 Cardiomyopathy, unspecified: Secondary | ICD-10-CM | POA: Diagnosis present

## 2015-06-08 DIAGNOSIS — Z7982 Long term (current) use of aspirin: Secondary | ICD-10-CM

## 2015-06-08 HISTORY — DX: Pneumonia, unspecified organism: J18.9

## 2015-06-08 LAB — CBG MONITORING, ED
GLUCOSE-CAPILLARY: 50 mg/dL — AB (ref 65–99)
GLUCOSE-CAPILLARY: 117 mg/dL — AB (ref 65–99)
GLUCOSE-CAPILLARY: 98 mg/dL (ref 65–99)
Glucose-Capillary: 89 mg/dL (ref 65–99)
Glucose-Capillary: 152 mg/dL — ABNORMAL HIGH (ref 65–99)
Glucose-Capillary: 96 mg/dL (ref 65–99)

## 2015-06-08 LAB — PROTIME-INR
INR: 1.51 — AB (ref 0.00–1.49)
PROTHROMBIN TIME: 18.2 s — AB (ref 11.6–15.2)

## 2015-06-08 LAB — I-STAT TROPONIN, ED: Troponin i, poc: 0.02 ng/mL (ref 0.00–0.08)

## 2015-06-08 LAB — BASIC METABOLIC PANEL
Anion gap: 12 (ref 5–15)
BUN: 54 mg/dL — AB (ref 6–20)
CO2: 22 mmol/L (ref 22–32)
Calcium: 8.2 mg/dL — ABNORMAL LOW (ref 8.9–10.3)
Chloride: 101 mmol/L (ref 101–111)
Creatinine, Ser: 2.64 mg/dL — ABNORMAL HIGH (ref 0.61–1.24)
GFR calc Af Amer: 28 mL/min — ABNORMAL LOW (ref >60)
GFR, EST NON AFRICAN AMERICAN: 25 mL/min — AB (ref >60)
GLUCOSE: 105 mg/dL — AB (ref 65–99)
POTASSIUM: 4.3 mmol/L (ref 3.5–5.1)
Sodium: 135 mmol/L (ref 135–145)

## 2015-06-08 LAB — CBC WITH DIFFERENTIAL/PLATELET
BASOS ABS: 0 10*3/uL (ref 0.0–0.1)
Basophils Relative: 0 %
EOS PCT: 1 %
Eosinophils Absolute: 0.1 10*3/uL (ref 0.0–0.7)
HEMATOCRIT: 40.1 % (ref 39.0–52.0)
Hemoglobin: 13.3 g/dL (ref 13.0–17.0)
LYMPHS PCT: 15 %
Lymphs Abs: 2.1 10*3/uL (ref 0.7–4.0)
MCH: 29.4 pg (ref 26.0–34.0)
MCHC: 33.2 g/dL (ref 30.0–36.0)
MCV: 88.5 fL (ref 78.0–100.0)
MONO ABS: 0.7 10*3/uL (ref 0.1–1.0)
MONOS PCT: 5 %
Neutro Abs: 10.5 10*3/uL — ABNORMAL HIGH (ref 1.7–7.7)
Neutrophils Relative %: 79 %
PLATELETS: 199 10*3/uL (ref 150–400)
RBC: 4.53 MIL/uL (ref 4.22–5.81)
RDW: 16.5 % — AB (ref 11.5–15.5)
WBC: 13.4 10*3/uL — ABNORMAL HIGH (ref 4.0–10.5)

## 2015-06-08 LAB — I-STAT CG4 LACTIC ACID, ED: Lactic Acid, Venous: 1.64 mmol/L (ref 0.5–2.0)

## 2015-06-08 LAB — APTT: APTT: 37 s (ref 24–37)

## 2015-06-08 LAB — BRAIN NATRIURETIC PEPTIDE: B Natriuretic Peptide: 81.2 pg/mL (ref 0.0–100.0)

## 2015-06-08 MED ORDER — SODIUM CHLORIDE 0.9 % IV BOLUS (SEPSIS)
1000.0000 mL | Freq: Once | INTRAVENOUS | Status: AC
  Administered 2015-06-08: 1000 mL via INTRAVENOUS

## 2015-06-08 MED ORDER — ASPIRIN EC 81 MG PO TBEC
81.0000 mg | DELAYED_RELEASE_TABLET | Freq: Every day | ORAL | Status: DC
  Administered 2015-06-10 – 2015-06-13 (×4): 81 mg via ORAL
  Filled 2015-06-08 (×5): qty 1

## 2015-06-08 MED ORDER — VANCOMYCIN HCL IN DEXTROSE 1-5 GM/200ML-% IV SOLN: 1000.0000 mg | Freq: Every day | INTRAVENOUS | Status: DC

## 2015-06-08 MED ORDER — GABAPENTIN 300 MG PO CAPS
600.0000 mg | ORAL_CAPSULE | Freq: Four times a day (QID) | ORAL | Status: DC
  Administered 2015-06-09 – 2015-06-13 (×19): 600 mg via ORAL
  Filled 2015-06-08 (×23): qty 2

## 2015-06-08 MED ORDER — HYDROCODONE-ACETAMINOPHEN 5-325 MG PO TABS
1.0000 | ORAL_TABLET | Freq: Four times a day (QID) | ORAL | Status: DC | PRN
  Administered 2015-06-11 – 2015-06-13 (×6): 1 via ORAL
  Filled 2015-06-08 (×6): qty 1

## 2015-06-08 MED ORDER — ONDANSETRON HCL 4 MG/2ML IJ SOLN: 4.0000 mg | Freq: Four times a day (QID) | INTRAMUSCULAR | Status: DC | PRN

## 2015-06-08 MED ORDER — METOPROLOL TARTRATE 25 MG PO TABS
25.0000 mg | ORAL_TABLET | Freq: Two times a day (BID) | ORAL | Status: DC
  Administered 2015-06-08: 25 mg via ORAL
  Filled 2015-06-08 (×2): qty 1

## 2015-06-08 MED ORDER — VITAMIN D3 25 MCG (1000 UNIT) PO TABS
2000.0000 [IU] | ORAL_TABLET | Freq: Two times a day (BID) | ORAL | Status: DC
  Administered 2015-06-09 – 2015-06-13 (×10): 2000 [IU] via ORAL
  Filled 2015-06-08 (×16): qty 2

## 2015-06-08 MED ORDER — SODIUM CHLORIDE 0.9 % IV BOLUS (SEPSIS)
500.0000 mL | Freq: Once | INTRAVENOUS | Status: AC
  Administered 2015-06-08: 500 mL via INTRAVENOUS

## 2015-06-08 MED ORDER — VANCOMYCIN HCL 10 G IV SOLR
1250.0000 mg | Freq: Every day | INTRAVENOUS | Status: DC
  Administered 2015-06-09: 1250 mg via INTRAVENOUS
  Filled 2015-06-08: qty 1250

## 2015-06-08 MED ORDER — ONDANSETRON HCL 4 MG PO TABS
4.0000 mg | ORAL_TABLET | Freq: Four times a day (QID) | ORAL | Status: DC | PRN
Start: 2015-06-08 — End: 2015-06-13

## 2015-06-08 MED ORDER — ALBUTEROL SULFATE HFA 108 (90 BASE) MCG/ACT IN AERS: 1.0000 | INHALATION_SPRAY | Freq: Four times a day (QID) | RESPIRATORY_TRACT | Status: DC | PRN

## 2015-06-08 MED ORDER — DEXTROSE 50 % IV SOLN
1.0000 | Freq: Once | INTRAVENOUS | Status: AC
  Administered 2015-06-08: 50 mL via INTRAVENOUS

## 2015-06-08 MED ORDER — APIXABAN 5 MG PO TABS
5.0000 mg | ORAL_TABLET | Freq: Two times a day (BID) | ORAL | Status: DC
  Administered 2015-06-09 – 2015-06-13 (×10): 5 mg via ORAL
  Filled 2015-06-08 (×12): qty 1

## 2015-06-08 MED ORDER — DEXTROSE 50 % IV SOLN
1.0000 | Freq: Once | INTRAVENOUS | Status: DC
  Filled 2015-06-08: qty 50

## 2015-06-08 MED ORDER — DM-GUAIFENESIN ER 30-600 MG PO TB12
1.0000 | ORAL_TABLET | Freq: Two times a day (BID) | ORAL | Status: DC
  Administered 2015-06-09 – 2015-06-13 (×10): 1 via ORAL
  Filled 2015-06-08 (×14): qty 1

## 2015-06-08 MED ORDER — DEXTROSE 50 % IV SOLN: 50.0000 mL | Freq: Once | INTRAVENOUS | Status: DC

## 2015-06-08 MED ORDER — ATORVASTATIN CALCIUM 40 MG PO TABS
40.0000 mg | ORAL_TABLET | Freq: Every day | ORAL | Status: DC
  Administered 2015-06-09 – 2015-06-12 (×4): 40 mg via ORAL
  Filled 2015-06-08 (×5): qty 1

## 2015-06-08 MED ORDER — LEVOFLOXACIN 500 MG PO TABS: 500.0000 mg | ORAL_TABLET | Freq: Every day | ORAL | Status: DC

## 2015-06-08 MED ORDER — DEXTROSE 50 % IV SOLN
INTRAVENOUS | Status: AC
  Administered 2015-06-08: 50 mL via INTRAVENOUS
  Filled 2015-06-08: qty 50

## 2015-06-08 MED ORDER — DEXTROSE 50 % IV SOLN: 25.0000 mL | INTRAVENOUS | Status: DC | PRN

## 2015-06-08 MED ORDER — SODIUM CHLORIDE 0.9 % IV BOLUS (SEPSIS): 500.0000 mL | Freq: Once | INTRAVENOUS | Status: DC

## 2015-06-08 MED ORDER — HYDROCORTISONE NA SUCCINATE PF 100 MG IJ SOLR
50.0000 mg | Freq: Once | INTRAMUSCULAR | Status: AC
  Administered 2015-06-09: 50 mg via INTRAVENOUS
  Filled 2015-06-08: qty 2

## 2015-06-08 MED ORDER — PIPERACILLIN-TAZOBACTAM 3.375 G IVPB
3.3750 g | Freq: Three times a day (TID) | INTRAVENOUS | Status: DC
  Administered 2015-06-09 – 2015-06-13 (×15): 3.375 g via INTRAVENOUS
  Filled 2015-06-08 (×16): qty 50

## 2015-06-08 MED ORDER — ACETAMINOPHEN 325 MG PO TABS: 325.0000 mg | ORAL_TABLET | Freq: Four times a day (QID) | ORAL | Status: DC | PRN

## 2015-06-08 NOTE — ED Notes (Signed)
Pt reports he came [facility] [place]. Pt slid out of his bed today. Denies hitting head or LOC even though he is taking Plavix. Pt's initial CBG was 44mg /dL. [facility] to obtained at 20g Left [facility]. Administered 400mL of NS and provided coke, chips, and a sandwich. Follow up CBG was 77mg /dL.

## 2015-06-08 NOTE — ED Notes (Signed)
Attempted to draw blood from previous line and the one obtained and unable to obtain blood. Will inform ED lab that blood is needed.

## 2015-06-08 NOTE — ED Notes (Signed)
Will give bedside report to ICU.

## 2015-06-08 NOTE — ED Notes (Signed)
Provided patient orange juice and encourage to drink fluids.

## 2015-06-08 NOTE — ED Notes (Signed)
Bed: WA09 Expected date: 06/08/15 Expected time: 7:23 PM Means of arrival: Ambulance Comments: 59 yo M  Fall hypoglycemia

## 2015-06-08 NOTE — ED Notes (Signed)
Spoke with Laural BenesJohnson, ICU charge nurse about the 20 minute timer starting.

## 2015-06-08 NOTE — H&P (Addendum)
[facility] History and Physical  IKE MARAGH ZOX:[id number] DOB: 15-Oct-1955 DOA: 06/08/2015  Referring physician: ED physician PCP: Carolynne Edouard, PA-C  Specialists:   Chief Complaint: Hypoglycemia, cough and shortness of breath.  HPI: Clayton [name] is a 59 y.o. male with PMH of a flutter on Eliquis, hypertension, hyperlipidemia, diabetes mellitus, GERD, stroke, CAD, MI, diabetic neuropathy, diastolic congestive heart failure, s/p of BKA, who presents with the hypoglycemia, cough and shortness of breath  Patient was recently hospitalized from 11/21-11/25 because of hypoglycemia, atrial flutter, and possible pneumonia. He was discharged on Levaquin x 4 days and Eliquis for A flutter. He states that he has completed 3 days of oral Levaquin and has one more pill to complete. He still has productive cough with brownish colored sputum production and mild shortness of breath. Patient does not have fever, chills, chest pain.  He is living in [facility] in [place]. Pt slid out of his bed today. Denies head injury or LOC. He was found to have CBG was 44 mg/dL which improved to /dL after treated with W11. Chart Review showed that he is supposed to take Lantus to 15 units daily, however patient reports that he is taking 45 units Lantus twice a day currently. He reports having burning on urination sometimes, but no frequency or dysuria. He does not have abdominal pain, diarrhea, unilateral weakness. Patient is not confused .  In ED, patient was found to have hypotension with blood pressure it 83/48, which improved to 99/62 after 500 mL 2 of normal saline bolus in the emergency room. BNP 81.2, WBC 13.4 which was 7.9 on 06/02/13, tachycardia, tachypnea and  AKI. CXR showed vascular congestion, but no focal infiltration. Patient is admitted to inpatient for further eval and treatment.  [facility]  an patient participate in ADLs?  Little   Review of  Systems:   General: no fevers, chills, no changes in body weight, has poor appetite, has fatigue HEENT: no blurry vision, hearing changes or sore throat Pulm: has dyspnea, coughing, no wheezing CV: no chest pain, palpitations Abd: no nausea, vomiting, abdominal pain, diarrhea, constipation GU: no dysuria, has burning on urination, no increased urinary frequency, hematuria  Ext: Trace leg edema Neuro: no unilateral weakness, numbness, or tingling, no vision change or hearing loss Skin: no rash MSK: No muscle spasm, no deformity, no limitation of range of movement in spin Heme: No easy bruising.  Travel history: No recent long distant travel.  Allergy:  Allergies  Allergen Reactions  . Niaspan [Niacin Er] Other (See Comments)    Face was red and hot    Past Medical History  Diagnosis Date  . Stroke (HCC)   . Coronary artery disease   . Diabetes mellitus without complication (HCC)   . Hypertension   . MI (myocardial infarction) (HCC)   . Hyperlipidemia   . GERD (gastroesophageal reflux disease)   . Diabetic neuropathy (HCC)   . Diabetic retinopathy (HCC)   . PNA (pneumonia)     Past Surgical History  Procedure Laterality Date  . Below knee leg amputation Left   . Appendectomy    . Percutaneous placement intravascular stent cervical carotid artery Right     Social History:  reports that he has quit smoking. He does not have any smokeless tobacco history on file. He reports that he does not drink alcohol or use illicit drugs.  Family History: History reviewed. No pertinent family history.   Prior to Admission  medications   Medication Sig Start Date End Date Taking? Authorizing Provider  acetaminophen (TYLENOL) 325 MG tablet Take 325 mg by mouth every 6 (six) hours as needed for moderate pain.   Yes Historical Provider, MD  albuterol (PROVENTIL HFA;VENTOLIN HFA) 108 (90 BASE) MCG/ACT inhaler Inhale 1-2 puffs into the lungs every 6 (six) hours as needed for wheezing or  shortness of breath.   Yes Historical Provider, MD  apixaban (ELIQUIS) 5 MG TABS tablet Take 1 tablet (5 mg total) by mouth 2 (two) times daily. 06/05/15  Yes Kathlen Mody, MD  aspirin EC 81 MG tablet Take 1 tablet (81 mg total) by mouth daily. 06/05/15  Yes Kathlen Mody, MD  atorvastatin (LIPITOR) 40 MG tablet Take 40 mg by mouth daily.   Yes Historical Provider, MD  cholecalciferol (VITAMIN D) 1000 UNITS tablet Take 2,000 Units by mouth 2 (two) times daily.   Yes Historical Provider, MD  furosemide (LASIX) 40 MG tablet Take 1 tablet (40 mg total) by mouth daily. 06/05/15  Yes Kathlen Mody, MD  gabapentin (NEURONTIN) 600 MG tablet Take 600 mg by mouth 4 (four) times daily.    Yes Historical Provider, MD  HYDROcodone-acetaminophen (NORCO/VICODIN) 5-325 MG tablet Take 1 tablet by mouth 3 (three) times daily as needed for moderate pain. 06/05/15  Yes Kathlen Mody, MD  insulin aspart (NOVOLOG) 100 UNIT/ML injection Inject 0-15 Units into the skin 3 (three) times daily with meals. Patient taking differently: Inject 0-20 Units into the skin 3 (three) times daily with meals as needed for high blood sugar. 15 units in the am, 20 at lunch, and 15 units at dinner, pt states is below 200 will not take any 08/15/14  Yes Esperanza Sheets, MD  insulin glargine (LANTUS) 100 UNIT/ML injection Inject 0.15 mLs (15 Units total) into the skin daily. 06/05/15  Yes Kathlen Mody, MD  levofloxacin (LEVAQUIN) 500 MG tablet Take 1 tablet (500 mg total) by mouth daily. 06/06/15  Yes Kathlen Mody, MD  lisinopril (PRINIVIL,ZESTRIL) 2.5 MG tablet Take 2.5 mg by mouth daily.   Yes Historical Provider, MD  metFORMIN (GLUCOPHAGE) 1000 MG tablet Take 1,000 mg by mouth 2 (two) times daily with a meal.   Yes Historical Provider, MD  metoprolol (LOPRESSOR) 50 MG tablet Take 25 mg by mouth 2 (two) times daily.   Yes Historical Provider, MD  traMADol (ULTRAM) 50 MG tablet Take 1 tablet (50 mg total) by mouth every 6 (six) hours as needed  for moderate pain. 06/05/15  Yes Kathlen Mody, MD    Physical Exam: Filed Vitals:   06/08/15 2219 06/08/15 2252 06/08/15 2305 06/08/15 2329  BP: 85/65 97/52  107/96  Pulse: 56 70 102 122  Temp:  97.8 F (36.6 C)    TempSrc:  Oral    Resp: 20 19  21   Height:      Weight:      SpO2: 93% 95%  95%   General: Not in acute distress HEENT:       Eyes: PERRL, EOMI, no scleral icterus.       ENT: No discharge from the ears and nose, no pharynx injection, no tonsillar enlargement.        Neck: No JVD, no bruit, no mass felt. Heme: No neck lymph node enlargement. Cardiac: S1/S2, irregularly irregular rhythm, tachycardia,No murmurs, No gallops or rubs. Pulm:  No rales, wheezing, rhonchi or rubs. Abd: Soft, nondistended, nontender, no rebound pain, no organomegaly, BS present. Ext: Trace pitting leg edema bilaterally. 2+DP/PT  pulse on the R leg. S/p of L BKA Musculoskeletal: No joint deformities, No joint redness or warmth, no limitation of ROM in spin. Skin: No rashes.  Neuro: Alert, oriented X3, cranial nerves II-XII grossly intact, muscle strength 5/5 in all extremities, sensation to light touch intact. Psych: Patient is not psychotic, no suicidal or hemocidal ideation.  Labs on Admission:  Basic Metabolic Panel:  Recent Labs Lab 06/02/15 0856 06/03/15 0439 06/04/15 0443 06/08/15 2053  NA 136 138 141 135  K 4.4 4.5 4.2 4.3  CL 95* 97* 99* 101  CO2 32 32 32 22  GLUCOSE 199* 168* 206* 105*  BUN 25* 26* 33* 54*  CREATININE 1.25* 1.19 1.15 2.64*  CALCIUM 9.1 9.2 9.3 8.2*  MG  --   --  1.9  --    Liver Function Tests:  Recent Labs Lab 06/02/15 0856 06/03/15 0439  AST 14* 12*  ALT 12* 12*  ALKPHOS 80 76  BILITOT 0.8 0.9  PROT 7.7 7.5  ALBUMIN 3.9 3.6   No results for input(s): LIPASE, AMYLASE in the last 168 hours. No results for input(s): AMMONIA in the last 168 hours. CBC:  Recent Labs Lab 06/03/15 0439 06/08/15 2053  WBC 7.9 13.4*  NEUTROABS  --  10.5*  HGB  11.8* 13.3  HCT 37.3* 40.1  MCV 90.1 88.5  PLT 178 199   Cardiac Enzymes:  Recent Labs Lab 06/02/15 1538  TROPONINI <0.03    BNP (last 3 results)  Recent Labs  03/23/15 1630 06/01/15 1235 06/08/15 2053  BNP 112.3* 58.5 81.2    ProBNP (last 3 results) No results for input(s): PROBNP in the last 8760 hours.  CBG:  Recent Labs Lab 06/08/15 2058 06/08/15 2133 06/08/15 2222 06/08/15 2254 06/09/15 0159  GLUCAP 117* 152* 96 98 96    Radiological Exams on Admission: Dg Chest Portable 1 View  06/08/2015  CLINICAL DATA:  Slid out of bed. Acute onset of hypoglycemia. Initial encounter. EXAM: PORTABLE CHEST 1 VIEW COMPARISON:  Chest radiograph performed 06/05/2015 FINDINGS: The lungs are well-aerated. Vascular congestion is noted. There is no evidence of focal opacification, pleural effusion or pneumothorax. The cardiomediastinal silhouette is borderline enlarged. The patient is status post median sternotomy, with evidence of prior CABG. No acute osseous abnormalities are seen. IMPRESSION: Vascular congestion and borderline cardiomegaly. Lungs remain grossly clear. No displaced rib fracture seen. Electronically Signed   By: Roanna Raider M.D.   On: 06/08/2015 21:35    EKG: Independently reviewed. A flutter with 4:1 AVB. QTc=500  Assessment/Plan Principal Problem:   Hypoglycemia Active Problems:   H/O: stroke   CAD (coronary artery disease)   DM type 2 with diabetic peripheral neuropathy (HCC)   HLD (hyperlipidemia)   Arterial hypotension   GERD (gastroesophageal reflux disease)   Community acquired pneumonia   AKI (acute kidney injury) (HCC)   Sepsis (HCC)   Chronic diastolic congestive heart failure (HCC)  Hypoglycemia: this is most likely due to overdosed lantus due to miss understanding. He reports that his doctor told him to take Lantus 45 units twice a day, however chart review showed that he is supposed to take 15 units daily. In addition he is also taking  NovoLog and metformin. Patient is septic on admission, which may have also contributed. -Will admit to SDU  -cbg q1h -prn D50 -IVF -give one dose of Solu Cortef, 15 mg 1 -Check cortisol level  Possible community acquired pneumonia and sepsis: The patient was recently hospitalized from 11/21--11/25 and treated  for possible pneumonia. He was discharged on Levaquin. Today he presents with worsening leukocytosis with WBC 7.9 on 06/03/15-->13.4. He also has sepsis with leukocytosis, hypotension and tachypnea. Chest x-ray did not show worsening pneumonia. Not sure whether pneumonia is the only source of sepsis.  -will broaden Abx coverage by switching Levaquin to vancomycin plus Zosyn -will get Procalcitonin and trend lactic acid levels per sepsis protocol. -IVF: EDP gave 500 cc x 2 of NS bolus, will given another 1L of NS, then 75 cc/h (patient has a congestive heart failure, limiting aggressive IV fluids treatment). -blood culure, urine culure and UA  Hx of stroke: -ASA and lipitor  HLD: Last LDL was 59 on 08/10/14. -Continue home medications: Lipitor  CAD: no chest pain -con Lipitor, aspirin, metoprololtinue   DM-II: Last A1c 5.7 on 06/01/15, well controled. Patient is taking Lantus, Novolin, metformin at home. Now patient has hypoglycemia -Hold home medications.  Arterial hypotension: Most likely due to sepsis. Other differential diagnoses include adrenal insufficiency though no history of AI. -Hold blood pressure medications. lasix and lisinopril -IVF as above -treat sepsis as above -give one dose of Solu Cortef, 15 mg 1 -Check cortisol level  Addendum: pt received total of 2.5 L NS, but MAP can not be maintained. Bp is 80/26 now.  -Started Neo pressor -consulted PCCM at 4:50 AM.  GERD: -Protonix  A flutter: CHA2DS2-VASc Score is 5, needs oral anticoagulation. Patient is on Eliquis at home. Heart rate is well controlled. -Continue Eliquis and metoprolol  Chronic diastolic  congestive heart failure (HCC): 2-D echo on 06/03/15 showed EF of 55-60 percent with grade 2 diastolic dysfunction. Patient is on Lasix 40 mg daily at home. Her BNP is 81.2. Patient only has trace leg edema. CHF is compensated. -Hold Lasix due to worsening renal function -Continue aspirin and metoprolol  AKI: Likely due to prerenal secondary to hypotension 2/2 sepsis, possible ATN. - IVF as above - Check FeUrea - US-renal - Follow up renal function by BMP - Hold her Lasix and lisinopril   DVT ppx: On Eliquis Code Status: Full code Family Communication: None at bed side.   Disposition Plan: Admit to inpatient   Date of Service 06/09/2015    Lorretta HarpIU, Sumie Remsen [facility] Pager [phone]  If 7PM-7AM, please contact night-coverage [email or web address] Password [email or web address] 06/09/2015, 2:40 AM

## 2015-06-08 NOTE — ED Provider Notes (Signed)
CSN: [id number]     Arrival date & time 06/08/15  1931 History   First MD Initiated Contact with Patient 06/08/15 2013     Chief Complaint  Patient presents with  . Hypoglycemia     (Consider location/radiation/quality/duration/timing/severity/associated sxs/prior Treatment) HPI  59 year old male with a history of CAD and diabetes presents from his facility with hypoglycemia. Patient states he has been feeling in his normal state since discharge for pneumonia a few days ago. He was sitting on the edge of his bed and then slid off of the bed onto the floor. Did not hit his head. Has no headache and he did not lose consciousness but when the facility checked on him they noted his blood sugar to be low at 49. EMS was called and they gave him oral food and drink. He was also hypotensive in the low 90s and they gave him 500 mL IV fluids. His glucose came up into the 70s after this. No change in his mental status before or after. On arrival here his blood pressure is 83/48 and his glucose is down to 51. Patient states he has cough seems to be worse than when he was discharged and he has shortness of breath but no chest pain. States it feels like he's "breathing in water". Denies leg swelling.  Past Medical History  Diagnosis Date  . Stroke (HCC)   . Coronary artery disease   . Diabetes mellitus without complication (HCC)   . Hypertension   . MI (myocardial infarction) (HCC)   . Hyperlipidemia   . GERD (gastroesophageal reflux disease)   . Diabetic neuropathy (HCC)   . Diabetic retinopathy (HCC)   . PNA (pneumonia)    Past Surgical History  Procedure Laterality Date  . Below knee leg amputation Left   . Appendectomy    . Percutaneous placement intravascular stent cervical carotid artery Right    History reviewed. No pertinent family history. Social History  Substance Use Topics  . Smoking status: Former Games developer  . Smokeless tobacco: None  . Alcohol Use: No    Review of Systems   Constitutional: Negative for fever.  Respiratory: Positive for cough and shortness of breath.   Cardiovascular: Negative for chest pain and leg swelling.  Gastrointestinal: Negative for vomiting.  Musculoskeletal: Positive for arthralgias (chronic right knee pain).  Neurological: Negative for syncope, weakness, light-headedness, numbness and headaches.  All other systems reviewed and are negative.     Allergies  Niaspan  Home Medications   Prior to Admission medications   Medication Sig Start Date End Date Taking? Authorizing Provider  albuterol (PROVENTIL HFA;VENTOLIN HFA) 108 (90 BASE) MCG/ACT inhaler Inhale 1-2 puffs into the lungs every 6 (six) hours as needed for wheezing or shortness of breath.    Historical Provider, MD  apixaban (ELIQUIS) 5 MG TABS tablet Take 1 tablet (5 mg total) by mouth 2 (two) times daily. 06/05/15   Kathlen Mody, MD  aspirin EC 81 MG tablet Take 1 tablet (81 mg total) by mouth daily. 06/05/15   Kathlen Mody, MD  atorvastatin (LIPITOR) 40 MG tablet Take 40 mg by mouth daily.    Historical Provider, MD  cholecalciferol (VITAMIN D) 1000 UNITS tablet Take 2,000 Units by mouth 2 (two) times daily.    Historical Provider, MD  furosemide (LASIX) 40 MG tablet Take 1 tablet (40 mg total) by mouth daily. 06/05/15   Kathlen Mody, MD  gabapentin (NEURONTIN) 600 MG tablet Take 600 mg by mouth 4 (four) times daily.  Historical Provider, MD  HYDROcodone-acetaminophen (NORCO/VICODIN) 5-325 MG tablet Take 1 tablet by mouth 3 (three) times daily as needed for moderate pain. 06/05/15   Kathlen ModyVijaya Akula, MD  insulin aspart (NOVOLOG) 100 UNIT/ML injection Inject 0-15 Units into the skin 3 (three) times daily with meals. Patient taking differently: Inject 0-20 Units into the skin 3 (three) times daily with meals as needed for high blood sugar. 15 units in the am, 20 at lunch, and 15 units at dinner, pt states is below 200 will not take any 08/15/14   Esperanza SheetsUlugbek N Buriev, MD  insulin  glargine (LANTUS) 100 UNIT/ML injection Inject 0.15 mLs (15 Units total) into the skin daily. 06/05/15   Kathlen ModyVijaya Akula, MD  levofloxacin (LEVAQUIN) 500 MG tablet Take 1 tablet (500 mg total) by mouth daily. 06/06/15   Kathlen ModyVijaya Akula, MD  lisinopril (PRINIVIL,ZESTRIL) 2.5 MG tablet Take 2.5 mg by mouth daily.    Historical Provider, MD  metFORMIN (GLUCOPHAGE) 1000 MG tablet Take 1,000 mg by mouth 2 (two) times daily with a meal.    Historical Provider, MD  metoprolol (LOPRESSOR) 50 MG tablet Take 25 mg by mouth 2 (two) times daily.    Historical Provider, MD  Multiple Vitamin (MULTIVITAMIN WITH MINERALS) TABS tablet Take 1 tablet by mouth daily.    Historical Provider, MD  traMADol (ULTRAM) 50 MG tablet Take 1 tablet (50 mg total) by mouth every 6 (six) hours as needed for moderate pain. 06/05/15   Kathlen ModyVijaya Akula, MD   BP 83/48 mmHg  Pulse 69  Temp(Src) 97.8 F (36.6 C) (Oral)  Resp 20  Ht 5\' 8"  (1.727 m)  Wt 240 lb (108.863 kg)  BMI 36.50 kg/m2  SpO2 93% Physical Exam  Constitutional: He is oriented to person, place, and time. He appears well-developed and well-nourished.  HENT:  Head: Normocephalic and atraumatic.  Right Ear: External ear normal.  Left Ear: External ear normal.  Nose: Nose normal.  Eyes: EOM are normal. Pupils are equal, round, and reactive to light. Right eye exhibits no discharge. Left eye exhibits no discharge.  Neck: Neck supple.  Cardiovascular: Normal rate, regular rhythm, normal heart sounds and intact distal pulses.   Pulmonary/Chest: Effort normal. He has wheezes (diffuse expiratory wheezes).  Abdominal: Soft. He exhibits no distension. There is no tenderness.  Musculoskeletal: He exhibits no edema.  Neurological: He is alert and oriented to person, place, and time.  Equal strength in all 4 extremities  Skin: Skin is warm and dry.  Nursing note and vitals reviewed.   ED Course  Procedures (including critical care time) Labs Review Labs Reviewed  BASIC  METABOLIC PANEL - Abnormal; Notable for the following:    Glucose, Bld 105 (*)    BUN 54 (*)    Creatinine, Ser 2.64 (*)    Calcium 8.2 (*)    GFR calc non Af Amer 25 (*)    GFR calc Af Amer 28 (*)    All other components within normal limits  CBC WITH DIFFERENTIAL/PLATELET - Abnormal; Notable for the following:    WBC 13.4 (*)    RDW 16.5 (*)    Neutro Abs 10.5 (*)    All other components within normal limits  CBG MONITORING, ED - Abnormal; Notable for the following:    Glucose-Capillary 50 (*)    All other components within normal limits  CBG MONITORING, ED - Abnormal; Notable for the following:    Glucose-Capillary 117 (*)    All other components within normal limits  CBG MONITORING, ED - Abnormal; Notable for the following:    Glucose-Capillary 152 (*)    All other components within normal limits  CULTURE, BLOOD (ROUTINE X 2)  CULTURE, BLOOD (ROUTINE X 2)  URINE CULTURE  BRAIN NATRIURETIC PEPTIDE  CORTISOL-AM, BLOOD  URINALYSIS, ROUTINE W REFLEX MICROSCOPIC (NOT AT [facility])  CREATININE, URINE, RANDOM  UREA NITROGEN, URINE  LACTIC ACID, PLASMA  LACTIC ACID, PLASMA  PROCALCITONIN  PROTIME-INR  APTT  COMPREHENSIVE METABOLIC PANEL  CBC  I-STAT TROPOININ, ED  I-STAT CG4 LACTIC ACID, ED  CBG MONITORING, ED  CBG MONITORING, ED  CBG MONITORING, ED    Imaging Review Dg Chest Portable 1 View  06/08/2015  CLINICAL DATA:  Slid out of bed. Acute onset of hypoglycemia. Initial encounter. EXAM: PORTABLE CHEST 1 VIEW COMPARISON:  Chest radiograph performed 06/05/2015 FINDINGS: The lungs are well-aerated. Vascular congestion is noted. There is no evidence of focal opacification, pleural effusion or pneumothorax. The cardiomediastinal silhouette is borderline enlarged. The patient is status post median sternotomy, with evidence of prior CABG. No acute osseous abnormalities are seen. IMPRESSION: Vascular congestion and borderline cardiomegaly. Lungs remain grossly clear. No displaced rib  fracture seen. Electronically Signed   By: Roanna Raider M.D.   On: 06/08/2015 21:35   I have personally reviewed and evaluated these images and lab results as part of my medical decision-making.   EKG Interpretation   Date/Time:  Monday June 08 2015 20:08:56 EST Ventricular Rate:  71 PR Interval:    QRS Duration: 107 QT Interval:  460 QTC Calculation: 500 R Axis:   10 Text Interpretation:  Atrial flutter with predominant 4:1 AV block no  significant change since Jun 02 2015 Confirmed by Criss Alvine  MD, Malay Fantroy  ([id number]) on 06/08/2015 8:16:11 PM      CRITICAL CARE Performed by: Pricilla Loveless T   Total critical care time: 40 minutes  Critical care time was exclusive of separately billable procedures and treating other patients.  Critical care was necessary to treat or prevent imminent or life-threatening deterioration.  Critical care was time spent personally by me on the following activities: development of treatment plan with patient and/or surrogate as well as nursing, discussions with consultants, evaluation of patient's response to treatment, examination of patient, obtaining history from patient or surrogate, ordering and performing treatments and interventions, ordering and review of laboratory studies, ordering and review of radiographic studies, pulse oximetry and re-evaluation of patient's condition.  MDM   Final diagnoses:  Hypoglycemia  Acute Kidney Injury Hypotension  Patient with recurrent hypoglycemia although no mental status changes. Found to have acute kidney injury, given that he is on insulin and he is likely retaining more insulin than needed. No further hypoglycemia after D50 and oral food. He has a history of heart failure but has intermittent hypotension and acute kidney injury and this has been given boluses of fluids. No worsening in his shortness of breath. No signs of new pneumonia. Will need admission to the stepdown unit with the  hospitalist.    Pricilla Loveless, MD 06/08/15 [phone]

## 2015-06-08 NOTE — ED Notes (Signed)
Attempted by Mardene CelesteJoAnna RN and NT to get additional blood. Unable to obtain. Will consult with IV team.

## 2015-06-09 ENCOUNTER — Inpatient Hospital Stay (HOSPITAL_COMMUNITY): Payer: Medicare HMO

## 2015-06-09 DIAGNOSIS — R6521 Severe sepsis with septic shock: Secondary | ICD-10-CM

## 2015-06-09 DIAGNOSIS — I5032 Chronic diastolic (congestive) heart failure: Secondary | ICD-10-CM | POA: Diagnosis present

## 2015-06-09 DIAGNOSIS — G9340 Encephalopathy, unspecified: Secondary | ICD-10-CM

## 2015-06-09 DIAGNOSIS — T83511A Infection and inflammatory reaction due to indwelling urethral catheter, initial encounter: Secondary | ICD-10-CM

## 2015-06-09 DIAGNOSIS — I959 Hypotension, unspecified: Secondary | ICD-10-CM

## 2015-06-09 DIAGNOSIS — A419 Sepsis, unspecified organism: Principal | ICD-10-CM

## 2015-06-09 DIAGNOSIS — N179 Acute kidney failure, unspecified: Secondary | ICD-10-CM

## 2015-06-09 DIAGNOSIS — N390 Urinary tract infection, site not specified: Secondary | ICD-10-CM

## 2015-06-09 LAB — CORTISOL-AM, BLOOD: CORTISOL - AM: 33 ug/dL — AB (ref 6.7–22.6)

## 2015-06-09 LAB — GLUCOSE, CAPILLARY
GLUCOSE-CAPILLARY: 96 mg/dL (ref 65–99)
GLUCOSE-CAPILLARY: 111 mg/dL — AB (ref 65–99)
Glucose-Capillary: 82 mg/dL (ref 65–99)
Glucose-Capillary: 84 mg/dL (ref 65–99)
Glucose-Capillary: 166 mg/dL — ABNORMAL HIGH (ref 65–99)
Glucose-Capillary: 145 mg/dL — ABNORMAL HIGH (ref 65–99)
Glucose-Capillary: 165 mg/dL — ABNORMAL HIGH (ref 65–99)
Glucose-Capillary: 133 mg/dL — ABNORMAL HIGH (ref 65–99)
Glucose-Capillary: 150 mg/dL — ABNORMAL HIGH (ref 65–99)

## 2015-06-09 LAB — URINE MICROSCOPIC-ADD ON

## 2015-06-09 LAB — URINALYSIS, ROUTINE W REFLEX MICROSCOPIC
BILIRUBIN URINE: NEGATIVE
GLUCOSE, UA: NEGATIVE mg/dL
KETONES UR: NEGATIVE mg/dL
Nitrite: NEGATIVE
Protein, ur: NEGATIVE mg/dL
Specific Gravity, Urine: 1.019 (ref 1.005–1.030)
pH: 5 (ref 5.0–8.0)

## 2015-06-09 LAB — CBC
HCT: 34.1 % — ABNORMAL LOW (ref 39.0–52.0)
Hemoglobin: 11.1 g/dL — ABNORMAL LOW (ref 13.0–17.0)
MCH: 28.7 pg (ref 26.0–34.0)
MCHC: 32.6 g/dL (ref 30.0–36.0)
MCV: 88.1 fL (ref 78.0–100.0)
PLATELETS: 155 10*3/uL (ref 150–400)
RBC: 3.87 MIL/uL — ABNORMAL LOW (ref 4.22–5.81)
RDW: 16.4 % — AB (ref 11.5–15.5)
WBC: 10.9 10*3/uL — AB (ref 4.0–10.5)

## 2015-06-09 LAB — COMPREHENSIVE METABOLIC PANEL
ALK PHOS: 77 U/L (ref 38–126)
ALT: 13 U/L — AB (ref 17–63)
AST: 16 U/L (ref 15–41)
Albumin: 3.3 g/dL — ABNORMAL LOW (ref 3.5–5.0)
Anion gap: 9 (ref 5–15)
BUN: 50 mg/dL — AB (ref 6–20)
CALCIUM: 7.6 mg/dL — AB (ref 8.9–10.3)
CO2: 21 mmol/L — AB (ref 22–32)
CREATININE: 1.91 mg/dL — AB (ref 0.61–1.24)
Chloride: 104 mmol/L (ref 101–111)
GFR, EST AFRICAN AMERICAN: 42 mL/min — AB (ref >60)
GFR, EST NON AFRICAN AMERICAN: 36 mL/min — AB (ref >60)
Glucose, Bld: 97 mg/dL (ref 65–99)
Potassium: 4.4 mmol/L (ref 3.5–5.1)
Sodium: 134 mmol/L — ABNORMAL LOW (ref 135–145)
Total Bilirubin: 0.8 mg/dL (ref 0.3–1.2)
Total Protein: 6.3 g/dL — ABNORMAL LOW (ref 6.5–8.1)

## 2015-06-09 LAB — CREATININE, URINE, RANDOM: CREATININE, URINE: 99.86 mg/dL

## 2015-06-09 LAB — PROCALCITONIN: Procalcitonin: <0.1 ng/mL

## 2015-06-09 LAB — LACTIC ACID, PLASMA
Lactic Acid, Venous: 1.6 mmol/L (ref 0.5–2.0)
Lactic Acid, Venous: 1.1 mmol/L (ref 0.5–2.0)

## 2015-06-09 LAB — TROPONIN I: Troponin I: 0.04 ng/mL — ABNORMAL HIGH (ref <0.031)

## 2015-06-09 MED ORDER — SODIUM CHLORIDE 0.9 % IV BOLUS (SEPSIS)
500.0000 mL | Freq: Once | INTRAVENOUS | Status: AC
  Administered 2015-06-09: 500 mL via INTRAVENOUS

## 2015-06-09 MED ORDER — PHENYLEPHRINE HCL 10 MG/ML IJ SOLN
30.0000 ug/min | INTRAMUSCULAR | Status: DC
  Administered 2015-06-09: 20 ug/min via INTRAVENOUS
  Administered 2015-06-09: 30 ug/min via INTRAVENOUS
  Filled 2015-06-09 (×2): qty 1

## 2015-06-09 MED ORDER — ALBUTEROL SULFATE (2.5 MG/3ML) 0.083% IN NEBU: 2.5000 mg | INHALATION_SOLUTION | Freq: Four times a day (QID) | RESPIRATORY_TRACT | Status: DC | PRN

## 2015-06-09 MED ORDER — SODIUM CHLORIDE 0.9 % IV SOLN
INTRAVENOUS | Status: DC
  Administered 2015-06-09 – 2015-06-11 (×4): via INTRAVENOUS

## 2015-06-09 NOTE — Progress Notes (Signed)
PT Cancellation Note  Patient Details Name: Clayton [name] MRN: [id number] DOB: 03-01-1956   Cancelled Treatment:    Reason Eval/Treat Not Completed: Medical issues which prohibited therapy (on neo drip for hypotension. will check back tomorrow.)   Rada HayHill, Tron Flythe Elizabeth 06/09/2015, 1:02 PM Blanchard KelchKaren Lavaeh Bau PT [phone]

## 2015-06-09 NOTE — Care Management Note (Signed)
Case Management Note  Patient Details  Name: Ree KidaLarry K Berke MRN: [id number] Date of Birth: [birth year]  Subjective/Objective:               Hypotensive state on iv neo drip     Action/Plan: home when stable   Expected Discharge Date:   (UNKNOWN)               Expected Discharge Plan:     In-House Referral:     Discharge planning Services     Post Acute Care Choice:    Choice offered to:     DME Arranged:    DME Agency:     HH Arranged:    HH Agency:     Status of Service:     Medicare Important Message Given:    Date Medicare IM Given:    Medicare IM give by:    Date Additional Medicare IM Given:    Additional Medicare Important Message give by:     If discussed at Long Length of Stay Meetings, dates discussed:    Additional Comments:  Golda AcreDavis, Amandeep Hogston Lynn, RN 06/09/2015, 10:56 AM

## 2015-06-09 NOTE — Progress Notes (Signed)
Patient ID: Clayton [name], male   DOB: 01/11/1956, 59 y.o.   MRN: [id number]  [facility] PROGRESS NOTE  Clayton [name] [id number] DOB: 01/11/1956 DOA: 06/08/2015 PCP: [name], [name], PA-C   Brief narrative:    59 year old male with PMH as below, which includes CVA, CAD, DM, HTN, Atrial flutter, and GERD, bilateral BKA, resident [facility], recently admitted from 11/21-11/25 for hypoglycemia, hospital course was complicated by new onset atrial flutter and RLL PNA. He was treated with levaquin. He was also started on eliquis for anti-coagulation. He was discharged on levaquin with proposed stop date 11/29, however, on 11/28 he again presented to ED with hypoglycemia, CBG's in 40's and cough.    Assessment/Plan:    Principal Problem:   Hypoglycemia - advance diet if pt able to tolerate - SSI per protocol   Active Problems:   Hypotensive shock - unclear etiology at this time, ? Cardiogenic vs hypovolemic, septic - currently on vanc and zosyn day #2 - peripheral neo if needed per PCCM, goal MAP > 60 mmHg and SBP > 90 mmHg    Atrial flutter with RVR - rate controlled for now - monitor on telemetry     Chronic systolic/diastolic CHF  - LV severe limitation of function, no EF estimated - monitor for volume overload - weight 109 kg this AM - strict I/O and daily weights     DM type 2 with diabetic peripheral neuropathy (HCC) - holding insulin for now until CBG more stable - place on SSI if needed - continue Neurontin     AKI (acute kidney injury) (HCC) - keep on IVF and repeat BMP in AM    Morbid obesity - Body mass index is 36.78 kg/(m^2).    Acute blood loss anemia - suspect hemodilutional in nature - on Chronic eliquis due to chronic AFlutter  DVT prophylaxis - on Eliquis   Code Status: Full.  Family Communication:  plan of care discussed with the patient Disposition Plan: Keep in SDU for now, no plan for d/c soon, pt still hypotensive and may  require pressors   IV access:  Peripheral IV  Procedures and diagnostic studies:     Dg Chest Portable 1 View 06/08/2015  Vascular congestion and borderline cardiomegaly. Lungs remain grossly clear.  Dg Chest Port 1 View 06/01/2015 Developing patchy airspace opacity in the lingula may reflect atelectasis or developing infiltrate. 2. Otherwise, stable chest x-ray compared to earlier today.  Medical Consultants:  PCCM  Other Consultants:  PT  IAnti-Infectives:   Vanc 11/28 --> Zosyn 11/28 -->   Debbora PrestoMAGICK-MYERS, ISKRA, MD  [facility] Pager [phone]  If 7PM-7AM, please contact night-coverage [email or web address] Password [email or web address] 06/09/2015, 4:03 PM   LOS: 1 day   HPI/Subjective: No events overnight.   Objective: Filed Vitals:   06/09/15 1200 06/09/15 1300 06/09/15 1400 06/09/15 1500  BP: 117/72 129/75 106/64 89/72  Pulse:      Temp: 98.9 F (37.2 C)     TempSrc: Oral     Resp: 21 20 27 21   Height:      Weight:      SpO2: 99% 100% 99% 95%    Intake/Output Summary (Last 24 hours) at 06/09/15 1603 Last data filed at 06/09/15 1500  Gross per 24 hour  Intake 3409.85 ml  Output   2065 ml  Net 1344.85 ml    Exam:   General:  Pt is alert, follows commands appropriately, not in acute distress  Cardiovascular: Regular rhythm, tachycardic,  S1/S2, no murmurs, no rubs, no gallops  Respiratory: Clear to auscultation bilaterally, no wheezing, no crackles, no rhonchi  Abdomen: Soft, non tender, non distended, bowel sounds present, no guarding  Extremities: bilateral BKA  Data Reviewed: Basic Metabolic Panel:  Recent Labs Lab 06/03/15 0439 06/04/15 0443 06/08/15 2053 06/09/15 0354  NA 138 141 135 134*  K 4.5 4.2 4.3 4.4  CL 97* 99* 101 104  CO2 32 32 22 21*  GLUCOSE 168* 206* 105* 97  BUN 26* 33* 54* 50*  CREATININE 1.19 1.15 2.64* 1.91*  CALCIUM 9.2 9.3 8.2* 7.6*  MG  --  1.9  --   --    Liver Function Tests:  Recent Labs Lab 06/03/15 0439 06/09/15 0354  AST  12* 16  ALT 12* 13*  ALKPHOS 76 77  BILITOT 0.9 0.8  PROT 7.5 6.3*  ALBUMIN 3.6 3.3*   CBC:  Recent Labs Lab 06/03/15 0439 06/08/15 2053 06/09/15 0354  WBC 7.9 13.4* 10.9*  NEUTROABS  --  10.5*  --   HGB 11.8* 13.3 11.1*  HCT 37.3* 40.1 34.1*  MCV 90.1 88.5 88.1  PLT 178 199 155   Cardiac Enzymes:  Recent Labs Lab 06/09/15 0354  TROPONINI 0.04*   CBG:  Recent Labs Lab 06/09/15 0410 06/09/15 0618 06/09/15 0822 06/09/15 1019 06/09/15 1236  GLUCAP 84 111* 166* 165* 145*    Recent Results (from the past 240 hour(s))  MRSA PCR Screening     Status: None   Collection Time: 06/01/15  5:13 PM  Result Value Ref Range Status   MRSA by PCR NEGATIVE NEGATIVE Final    Comment:        The GeneXpert MRSA Assay (FDA approved for NASAL specimens only), is one component of a comprehensive MRSA colonization surveillance program. It is not intended to diagnose MRSA infection nor to guide or monitor treatment for MRSA infections.   Culture, blood (routine x 2)     Status: None   Collection Time: 06/02/15  8:56 AM  Result Value Ref Range Status   Specimen Description BLOOD RIGHT ARM  Final   Special Requests BOTTLES DRAWN AEROBIC AND ANAEROBIC 8CC  Final   Culture   Final    NO GROWTH 5 DAYS Performed at [facility]    Report Status 06/07/2015 FINAL  Final  Culture, blood (routine x 2)     Status: None   Collection Time: 06/02/15  9:10 AM  Result Value Ref Range Status   Specimen Description BLOOD RIGHT HAND  Final   Special Requests IN PEDIATRIC BOTTLE 3CC  Final   Culture   Final    NO GROWTH 5 DAYS Performed at [facility]    Report Status 06/07/2015 FINAL  Final     Scheduled Meds: . apixaban  5 mg Oral BID  . aspirin EC  81 mg Oral Daily  . atorvastatin  40 mg Oral q1800  . cholecalciferol  2,000 Units Oral BID  . dextromethorphan-guaiFENesin  1 tablet Oral BID  . dextrose  1 ampule Intravenous Once  . gabapentin  600 mg Oral QID  .  piperacillin-tazobactam (ZOSYN)  IV  3.375 g Intravenous 3 times per day   Continuous Infusions: . sodium chloride 75 mL/hr at 06/09/15 0503  . phenylephrine (NEO-SYNEPHRINE) Adult infusion Stopped (06/09/15 1330)

## 2015-06-09 NOTE — Progress Notes (Signed)
eLink Physician-Brief Progress Note Patient Name: Clayton [name] DOB: 01/23/1956 MRN: [id number]   Date of Service  06/09/2015  HPI/Events of Note  Choked on oral medications while trying to take several pills at one time. Possible aspiration of medications. No respiratory distress or increase in O2 requirement.   eICU Interventions  Check portable CXR in AM.     Intervention Category Intermediate Interventions: Other:  [name],[name] 06/09/2015, 9:42 PM

## 2015-06-09 NOTE — Progress Notes (Signed)
ANTIBIOTIC CONSULT NOTE - INITIAL  Pharmacy Consult for Vancomycin Indication: sepsis, r/o HCAP  Allergies  Allergen Reactions  . Niaspan [Niacin Er] Other (See Comments)    Face was red and hot    Patient Measurements: Height: 5\' 8"  (172.7 cm) Weight: 241 lb 13.5 oz (109.7 kg) IBW/kg (Calculated) : 68.4 Adjusted Body Weight:   Vital Signs: Temp: 99.1 F (37.3 C) (11/29 0341) Temp Source: Oral (11/29 0341) BP: 94/50 mmHg (11/29 0500) Pulse Rate: 71 (11/29 0500) Intake/Output from previous day: 11/28 0701 - 11/29 0700 In: 2200 [IV Piggyback:2200] Out: 750 [Urine:750] Intake/Output from this shift: Total I/O In: 2200 [IV Piggyback:2200] Out: 750 [Urine:750]  Labs:  Recent Labs  06/08/15 2053 06/09/15 0354  WBC 13.4* 10.9*  HGB 13.3 11.1*  PLT 199 155  CREATININE 2.64* 1.91*   Estimated Creatinine Clearance: 48.8 mL/min (by C-G formula based on Cr of 1.91). No results for input(s): VANCOTROUGH, VANCOPEAK, VANCORANDOM, GENTTROUGH, GENTPEAK, GENTRANDOM, TOBRATROUGH, TOBRAPEAK, TOBRARND, AMIKACINPEAK, AMIKACINTROU, AMIKACIN in the last 72 hours.   Microbiology: Recent Results (from the past 720 hour(s))  MRSA PCR Screening     Status: None   Collection Time: 06/01/15  5:13 PM  Result Value Ref Range Status   MRSA by PCR NEGATIVE NEGATIVE Final    Comment:        The GeneXpert MRSA Assay (FDA approved for NASAL specimens only), is one component of a comprehensive MRSA colonization surveillance program. It is not intended to diagnose MRSA infection nor to guide or monitor treatment for MRSA infections.   Culture, blood (routine x 2)     Status: None   Collection Time: 06/02/15  8:56 AM  Result Value Ref Range Status   Specimen Description BLOOD RIGHT ARM  Final   Special Requests BOTTLES DRAWN AEROBIC AND ANAEROBIC 8CC  Final   Culture   Final    NO GROWTH 5 DAYS [facility]    Report Status 06/07/2015 FINAL  Final  Culture, blood  (routine x 2)     Status: None   Collection Time: 06/02/15  9:10 AM  Result Value Ref Range Status   Specimen Description BLOOD RIGHT HAND  Final   Special Requests IN PEDIATRIC BOTTLE 3CC  Final   Culture   Final    NO GROWTH 5 DAYS Performed at [facility]    Report Status 06/07/2015 FINAL  Final    Medical History: Past Medical History  Diagnosis Date  . Stroke (HCC)   . Coronary artery disease   . Diabetes mellitus without complication (HCC)   . Hypertension   . MI (myocardial infarction) (HCC)   . Hyperlipidemia   . GERD (gastroesophageal reflux disease)   . Diabetic neuropathy (HCC)   . Diabetic retinopathy (HCC)   . PNA (pneumonia)     Medications:  Anti-infectives    Start     Dose/Rate Route Frequency Ordered Stop   06/09/15 1000  levofloxacin (LEVAQUIN) tablet 500 mg  Status:  Discontinued     500 mg Oral Daily 06/08/15 2249 06/08/15 2301   06/08/15 2315  piperacillin-tazobactam (ZOSYN) IVPB 3.375 g     3.375 g 12.5 mL/hr over 240 Minutes Intravenous 3 times per day 06/08/15 2301     06/08/15 2315  vancomycin (VANCOCIN) IVPB 1000 mg/200 mL premix  Status:  Discontinued     1,000 mg 200 mL/hr over 60 Minutes Intravenous Daily at bedtime 06/08/15 2304 06/08/15 2304   06/08/15 2315  vancomycin (VANCOCIN)  1,250 mg in sodium chloride 0.9 % 250 mL IVPB     1,250 mg 166.7 mL/hr over 90 Minutes Intravenous Daily at bedtime 06/08/15 2305       Assessment: Patient with cough and SOB in ED.  Patient with sepsis and r/o HCAP.  First dose of antibiotics already given.  Goal of Therapy:  Vancomycin trough level 15-20 mcg/ml  Plan:  Measure antibiotic drug levels at steady state Follow up culture results Vancomycin  iv q24hr  Darlina Guys, Jacquenette Shone Crowford 06/09/2015,6:17 AM

## 2015-06-09 NOTE — Progress Notes (Signed)
OT Cancellation Note  Patient Details Name: Clayton [name] MRN: [id number] DOB: [birth year]   Cancelled Treatment:    Reason Eval/Treat Not Completed: Medical issues which prohibited therapy.  Pt is on a drip.  Will check back tomorrow.  [name] 06/09/2015, 1:13 PM  [name], OTR/L [phone] 06/09/2015

## 2015-06-09 NOTE — Consult Note (Signed)
PULMONARY / CRITICAL CARE MEDICINE   Name: Clayton [name] MRN: [id number] DOB: Jan 28, 1956    ADMISSION DATE:  06/08/2015 CONSULTATION DATE:  06/09/2015  REFERRING MD :  Dr. Clyde Lundborg, [facility]  CHIEF COMPLAINT:  hypoglycemia  HISTORY OF PRESENT ILLNESS:   59 year old male with PMH as below, which includes CVA, CAD, DM, HTN, Atrial flutter, and GERD. He has bilateral BKA. He resides in SNF due to multiple medical co-morbidities. He was recently admitted from 11/21-11/25 initially for hypoglycemia, however the hospital course was complicated by new onset atrial flutter and probable RLL PNA. He was treated with levaquin. He was also started on eliquis for anti-coagulation, but it was thought he would eventually need ablation. He was discharged on levaquin with proposed stop date 11/29, however, on 11/28 he again presented to ED  [facility] with chief complaint hypoglycemia. Also complains of persistent productive cough.  11/28 he slid out of bed at SNF, and was found to have glucose of 44, which improved to 77 after being treated with D50. He has reportedly been taking 45 units of lantus BID, however, chart review at admission revealed that he was only to be taking 15 units daily. He presented to ED for this, where he was found to be hypotensive with SBP 83. This somewhat improved with NS bolus initially, however, soon dropped again. No obvious infectious source, but white count mildly elevated at time of admission to 13.4. Since time of admission, course has been complicated by AF RVR and persistent hypotension. Lactic acid normal. PCCM asked to see.    PAST MEDICAL HISTORY :  He  has a past medical history of Stroke [facility]); Coronary artery disease; Diabetes mellitus without complication (HCC); Hypertension; MI (myocardial infarction) (HCC); Hyperlipidemia; GERD (gastroesophageal reflux disease); Diabetic neuropathy (HCC); Diabetic retinopathy (HCC); and PNA (pneumonia).  PAST SURGICAL  HISTORY: He  has past surgical history that includes Below knee leg amputation (Left); Appendectomy; and Percutaneous placement intravascular stent cervical carotid artery (Right).  Allergies  Allergen Reactions  . Niaspan [Niacin Er] Other (See Comments)    Face was red and hot    No current facility-administered medications on file prior to encounter.   Current Outpatient Prescriptions on File Prior to Encounter  Medication Sig  . albuterol (PROVENTIL HFA;VENTOLIN HFA) 108 (90 BASE) MCG/ACT inhaler Inhale 1-2 puffs into the lungs every 6 (six) hours as needed for wheezing or shortness of breath.  Marland Kitchen apixaban (ELIQUIS) 5 MG TABS tablet Take 1 tablet (5 mg total) by mouth 2 (two) times daily.  Marland Kitchen aspirin EC 81 MG tablet Take 1 tablet (81 mg total) by mouth daily.  Marland Kitchen atorvastatin (LIPITOR) 40 MG tablet Take 40 mg by mouth daily.  . cholecalciferol (VITAMIN D) 1000 UNITS tablet Take 2,000 Units by mouth 2 (two) times daily.  . furosemide (LASIX) 40 MG tablet Take 1 tablet (40 mg total) by mouth daily.  Marland Kitchen gabapentin (NEURONTIN) 600 MG tablet Take 600 mg by mouth 4 (four) times daily.   Marland Kitchen HYDROcodone-acetaminophen (NORCO/VICODIN) 5-325 MG tablet Take 1 tablet by mouth 3 (three) times daily as needed for moderate pain.  Marland Kitchen insulin aspart (NOVOLOG) 100 UNIT/ML injection Inject 0-15 Units into the skin 3 (three) times daily with meals. (Patient taking differently: Inject 0-20 Units into the skin 3 (three) times daily with meals as needed for high blood sugar. 15 units in the am, 20 at lunch, and 15 units at dinner, pt states is below 200 will not  take any)  . insulin glargine (LANTUS) 100 UNIT/ML injection Inject 0.15 mLs (15 Units total) into the skin daily.  Marland Kitchen levofloxacin (LEVAQUIN) 500 MG tablet Take 1 tablet (500 mg total) by mouth daily.  Marland Kitchen lisinopril (PRINIVIL,ZESTRIL) 2.5 MG tablet Take 2.5 mg by mouth daily.  . metFORMIN (GLUCOPHAGE) 1000 MG tablet Take 1,000 mg by mouth 2 (two) times daily  with a meal.  . metoprolol (LOPRESSOR) 50 MG tablet Take 25 mg by mouth 2 (two) times daily.  . traMADol (ULTRAM) 50 MG tablet Take 1 tablet (50 mg total) by mouth every 6 (six) hours as needed for moderate pain.    FAMILY HISTORY:  His has no family status information on file.   SOCIAL HISTORY: He  reports that he has quit smoking. He does not have any smokeless tobacco history on file. He reports that he does not drink alcohol or use illicit drugs.  REVIEW OF SYSTEMS:   Bolds are positive  Constitutional: weight loss, gain, night sweats, Fevers, chills, fatigue .  HEENT: headaches, Sore throat, sneezing, nasal congestion, post nasal drip, Difficulty swallowing, Tooth/dental problems, visual complaints visual changes, ear ache CV:  chest pain, radiates: ,Orthopnea, PND, swelling in lower extremities, dizziness, palpitations, syncope.  GI  heartburn, indigestion, abdominal pain, nausea, vomiting, diarrhea, change in bowel habits, loss of appetite, bloody stools.  Resp: cough, productive: , hemoptysis, dyspnea, chest pain, pleuritic.  Skin: rash or itching or icterus GU: dysuria, change in color of urine, urgency or frequency. flank pain, hematuria  MS: joint pain or swelling. decreased range of motion  Psych: change in mood or affect. depression or anxiety.  Neuro: difficulty with speech, weakness, numbness, ataxia    SUBJECTIVE:   VITAL SIGNS: BP 94/50 mmHg  Pulse 71  Temp(Src) 99.1 F (37.3 C) (Oral)  Resp 9  Ht 5\' 8"  (1.727 m)  Wt 109.7 kg (241 lb 13.5 oz)  BMI 36.78 kg/m2  SpO2 100%  HEMODYNAMICS:    VENTILATOR SETTINGS:    INTAKE / OUTPUT:    PHYSICAL EXAMINATION: General:  Obese male in NAD, resting comfortably Neuro:  Alert, oriented x 3. Non-focal HEENT:  /At, no JVD, PERRL Cardiovascular:  Irreg Irreg. A flutter 4/1 on tele. No MRG Lungs:  Diminished R base, otherwise clear Abdomen:  Soft, non-tender, non-distended. Hyperactive BS Musculoskeletal:   Left BKA, R mid-foot ampuation Skin:  Grossly intact  LABS:  CBC  Recent Labs Lab 06/03/15 0439 06/08/15 2053 06/09/15 0354  WBC 7.9 13.4* 10.9*  HGB 11.8* 13.3 11.1*  HCT 37.3* 40.1 34.1*  PLT 178 199 155   Coag's  Recent Labs Lab 06/08/15 2053  APTT 37  INR 1.51*   BMET  Recent Labs Lab 06/04/15 0443 06/08/15 2053 06/09/15 0354  NA 141 135 134*  K 4.2 4.3 4.4  CL 99* 101 104  CO2 32 22 21*  BUN 33* 54* 50*  CREATININE 1.15 2.64* 1.91*  GLUCOSE 206* 105* 97   Electrolytes  Recent Labs Lab 06/04/15 0443 06/08/15 2053 06/09/15 0354  CALCIUM 9.3 8.2* 7.6*  MG 1.9  --   --    Sepsis Markers  Recent Labs Lab 06/08/15 0030 06/08/15 2053 06/09/15 0354  LATICACIDVEN 1.6 1.64 1.1  PROCALCITON  --  <0.10  --    ABG No results for input(s): PHART, PCO2ART, PO2ART in the last 168 hours. Liver Enzymes  Recent Labs Lab 06/02/15 0856 06/03/15 0439 06/09/15 0354  AST 14* 12* 16  ALT 12* 12* 13*  ALKPHOS 80 76 77  BILITOT 0.8 0.9 0.8  ALBUMIN 3.9 3.6 3.3*   Cardiac Enzymes  Recent Labs Lab 06/02/15 1538  TROPONINI <0.03   Glucose  Recent Labs Lab 06/08/15 2133 06/08/15 2222 06/08/15 2254 06/09/15 0045 06/09/15 0159 06/09/15 0410  GLUCAP 152* 96 98 82 96 84    Imaging Dg Chest Portable 1 View  06/08/2015  CLINICAL DATA:  Slid out of bed. Acute onset of hypoglycemia. Initial encounter. EXAM: PORTABLE CHEST 1 VIEW COMPARISON:  Chest radiograph performed 06/05/2015 FINDINGS: The lungs are well-aerated. Vascular congestion is noted. There is no evidence of focal opacification, pleural effusion or pneumothorax. The cardiomediastinal silhouette is borderline enlarged. The patient is status post median sternotomy, with evidence of prior CABG. No acute osseous abnormalities are seen. IMPRESSION: Vascular congestion and borderline cardiomegaly. Lungs remain grossly clear. No displaced rib fracture seen. Electronically Signed   By: Roanna RaiderJeffery  Chang  M.D.   On: 06/08/2015 21:35     STUDIES:  11/23 echo > Poor image qualithy LV is dilated with severe decrease in LV function.  CULTURES: BCx2 11/28 >>>  ANTIBIOTICS: Zosyn 11/28 >>> Vancomycin 11/28 >>>   SIGNIFICANT EVENTS: 11/21-25 > admit for hypoglycemia, aflutter, CAP  LINES/TUBES:   DISCUSSION: 59 year old male with history of CVA, DM, recent A flutter. Recently admitted and undergoing treatment for CAP. Presented to ED 11/28 with hypoglycemia which improved with D50 administration. Hypotensive in ED which has not responded to 3L normal saline. Currently BP remains low with SBP 92 at time of my evaluation and MAP 61. His mental status is intact and UOP is currently about 100cc/hr over the past 5 hours. Lactic acid was never elevated, most recent assessment at 0400 was 1.1. He reports normal SBP 100-120, however, was higher than that throughout recent admission.  Etiology of hypotension in uncertain at this time. Cardiogenic shock could certainly be possibly given known poor LV function on recent echo. Sepsis also possible with recent CAP, however CXR does not indicate this. No other obvious sources. Procalcitonin also reassuring. Will continue to monitor in ICU and provide gentle IVF resuscitation. Phenylephrine ordered PRN, however may need escalation of this with CVL and levophed if BP does not improve.   ASSESSMENT / PLAN:  PULMONARY A: Possible PNA, doubt  P:   Monitor  CARDIOVASCULAR A:  Shock, uncertain etiology at this point. Cardiogenic vs septi vs hypovolemic  Atrial flutter with RVR, now rate controlled Chronic systolic/diastolic CHF (LV severe limitation of function, no EF estimated)  CAD  P:  Continue gentle IVF resuscitation Peripheral neo for now if needed BP goal MAP greater than 7260mm/Hg, SBP > 6090mm/Hg May need CVL and pressors Trend troponin Recommend cardiology consultation in AM  RENAL A:   Acute renal failure (Baseline SCr 1.1)  P:   Trend  renal function and UOP Correct electrolytes as indicated  GASTROINTESTINAL A:   GERD  P:   NPO for now except for meds Protonix  HEMATOLOGIC A:   Anemia, acute suspect hemodilutional in nature Chronic eliquis due to chronic AFlutter  P:  Continue Eliquis Follow CBC  INFECTIOUS A:   SIRS, no clear infectious source ?HCAP  P:   Trend WBC and fever curve Follow cultures ABX as above, Vanc Zosyn Low threshold to DC antibiotics Continue PCT algorithm  ENDOCRINE A:   Poorly controlled DM now with hypoglycemia  P:   Hourly CBG assessment D50 as needed for hypoglycemia per protocol Hold metformin  NEUROLOGIC A:  Acute encephalopathy, unclear what baseline is.  P:   Monitor   FAMILY  - Updates: Discussed with patient 11/29 early AM  - Inter-disciplinary family meet or Palliative Care meeting due by:  12/6  APP critical care time: 45 mins  Joneen Roach, AGACNP-BC [place] [facility]/Critical [facility] Pager [phone] or [phone]  06/09/2015 5:36 AM    Attending:  I have seen and examined the patient with nurse practitioner/resident and agree with the note above.  My edits are in BOLD above.  We formulated the plan together and I elicited the following history.    Mr. Schiele was admitted yesterday for hypotension and leukocytosis, noted to have a UTI which is associated with his chronic urinary catheter.  His pressor requirement has improved this morning and he is eating.  His initial encephalopathy has improved.  On exam Lungs with a few rhonchi right lower lobe, good air movement CV: RRR on my exam, no clear murmur GI: soft, nontender, no masses  U/A : significant pyuria CXR > no clear infiltrate (personally reviewed)  Shock> likely septic shock with hypotension, normal lactic acid is reassuring, UOP adequate.  He is now on neosynephrine but the requirement is improving.  Will decrease MAP goal to 55 to wean off neosynephrine. Catheter  associated UTI> 07/2014 had a very resistant proteus organism, was recently treated with levaquin so we can assume this is likely resistant to that drug.  Will stop vancomycin and maintain zosyn for now, follow urine culture; change foley catheter today  My independent CC time is 33 minutes  Heber [place], MD [place] P[facility]M Pager: [phone] Cell: [phone] After 3pm or if no response, call [phone]

## 2015-06-10 ENCOUNTER — Other Ambulatory Visit: Payer: Self-pay

## 2015-06-10 ENCOUNTER — Inpatient Hospital Stay (HOSPITAL_COMMUNITY): Payer: Medicare HMO

## 2015-06-10 DIAGNOSIS — E162 Hypoglycemia, unspecified: Secondary | ICD-10-CM

## 2015-06-10 DIAGNOSIS — I951 Orthostatic hypotension: Secondary | ICD-10-CM

## 2015-06-10 DIAGNOSIS — I483 Typical atrial flutter: Secondary | ICD-10-CM

## 2015-06-10 DIAGNOSIS — K219 Gastro-esophageal reflux disease without esophagitis: Secondary | ICD-10-CM

## 2015-06-10 LAB — BASIC METABOLIC PANEL
ANION GAP: 6 (ref 5–15)
BUN: 30 mg/dL — AB (ref 6–20)
CO2: 23 mmol/L (ref 22–32)
Calcium: 8.4 mg/dL — ABNORMAL LOW (ref 8.9–10.3)
Chloride: 107 mmol/L (ref 101–111)
Creatinine, Ser: 1.17 mg/dL (ref 0.61–1.24)
Glucose, Bld: 144 mg/dL — ABNORMAL HIGH (ref 65–99)
POTASSIUM: 4.3 mmol/L (ref 3.5–5.1)
SODIUM: 136 mmol/L (ref 135–145)

## 2015-06-10 LAB — CBC WITH DIFFERENTIAL/PLATELET
BASOS ABS: 0 10*3/uL (ref 0.0–0.1)
Basophils Relative: 0 %
EOS PCT: 2 %
Eosinophils Absolute: 0.1 10*3/uL (ref 0.0–0.7)
HCT: 33.4 % — ABNORMAL LOW (ref 39.0–52.0)
Hemoglobin: 10.8 g/dL — ABNORMAL LOW (ref 13.0–17.0)
LYMPHS PCT: 16 %
Lymphs Abs: 1.1 10*3/uL (ref 0.7–4.0)
MCH: 28.3 pg (ref 26.0–34.0)
MCHC: 32.3 g/dL (ref 30.0–36.0)
MCV: 87.7 fL (ref 78.0–100.0)
Monocytes Absolute: 0.5 10*3/uL (ref 0.1–1.0)
Monocytes Relative: 7 %
NEUTROS PCT: 75 %
Neutro Abs: 5.1 10*3/uL (ref 1.7–7.7)
PLATELETS: 129 10*3/uL — AB (ref 150–400)
RBC: 3.81 MIL/uL — AB (ref 4.22–5.81)
RDW: 16 % — ABNORMAL HIGH (ref 11.5–15.5)
WBC: 6.8 10*3/uL (ref 4.0–10.5)

## 2015-06-10 LAB — GLUCOSE, CAPILLARY
GLUCOSE-CAPILLARY: 121 mg/dL — AB (ref 65–99)
GLUCOSE-CAPILLARY: 126 mg/dL — AB (ref 65–99)
GLUCOSE-CAPILLARY: 212 mg/dL — AB (ref 65–99)
Glucose-Capillary: 183 mg/dL — ABNORMAL HIGH (ref 65–99)

## 2015-06-10 LAB — URINE CULTURE: Culture: >=100000

## 2015-06-10 LAB — UREA NITROGEN, URINE: Urea Nitrogen, Ur: 725 mg/dL

## 2015-06-10 MED ORDER — METOPROLOL TARTRATE 25 MG PO TABS
25.0000 mg | ORAL_TABLET | Freq: Two times a day (BID) | ORAL | Status: DC
  Administered 2015-06-10 (×2): 25 mg via ORAL
  Filled 2015-06-10 (×2): qty 1

## 2015-06-10 MED ORDER — METOPROLOL TARTRATE 1 MG/ML IV SOLN
INTRAVENOUS | Status: AC
  Filled 2015-06-10: qty 5

## 2015-06-10 MED ORDER — METOPROLOL TARTRATE 1 MG/ML IV SOLN
2.5000 mg | Freq: Once | INTRAVENOUS | Status: AC
  Administered 2015-06-10: 2.5 mg via INTRAVENOUS

## 2015-06-10 MED ORDER — FLUCONAZOLE IN SODIUM CHLORIDE 200-0.9 MG/100ML-% IV SOLN
200.0000 mg | INTRAVENOUS | Status: DC
Start: 2015-06-10 — End: 2015-06-13
  Administered 2015-06-10 – 2015-06-12 (×3): 200 mg via INTRAVENOUS
  Filled 2015-06-10 (×3): qty 100

## 2015-06-10 NOTE — Progress Notes (Signed)
PULMONARY / CRITICAL CARE MEDICINE   Name: Ree KidaLarry K Therrien MRN: [id number] DOB: [birth year]    ADMISSION DATE:  06/08/2015 CONSULTATION DATE:  06/09/2015  REFERRING MD :  Dr. Clyde LundborgNiu, [facility]  CHIEF COMPLAINT:  hypoglycemia  Brief:   59 year old male with CVA, CAD, DM, HTN, Atrial flutter, and GERD. He has bilateral BKA. He resides in SNF and was admitted on 11/30 with septic shock, acute kidney injury due to a UTI.   SUBJECTIVE:  Choked on some medication last night by report, no respiratory distress, rested comfortably overnight  VITAL SIGNS: BP 137/74 mmHg  Pulse 94  Temp(Src) 98.2 F (36.8 C) (Oral)  Resp 25  Ht 5\' 8"  (1.727 m)  Wt 235 lb 14.3 oz (107 kg)  BMI 35.88 kg/m2  SpO2 97%  HEMODYNAMICS:    VENTILATOR SETTINGS:    INTAKE / OUTPUT: I/O last 3 completed shifts: In: 5454.9 [P.O.:820; I.V.:1934.9; Other:300; IV Piggyback:2400] Out: 4515 [Urine:4515]  PHYSICAL EXAMINATION: General:  Obese male in NAD, resting comfortably Neuro:  Sleepy initially but was able to wake up to voice and was Awake and alert, no distress HEENT:  NCAT, EOMi Cardiovascular:  Irreg irreg, no clear murmur Lungs:  CTA B normal effort Abdomen:  Bowel sounds positive, soft Musculoskeletal:  Left BKA, R mid-foot ampuation Skin:  No rash, no mottling  LABS:  CBC  Recent Labs Lab 06/08/15 2053 06/09/15 0354 06/10/15 0400  WBC 13.4* 10.9* 6.8  HGB 13.3 11.1* 10.8*  HCT 40.1 34.1* 33.4*  PLT 199 155 129*   Coag's  Recent Labs Lab 06/08/15 2053  APTT 37  INR 1.51*   BMET  Recent Labs Lab 06/08/15 2053 06/09/15 0354 06/10/15 0400  NA 135 134* 136  K 4.3 4.4 4.3  CL 101 104 107  CO2 22 21* 23  BUN 54* 50* 30*  CREATININE 2.64* 1.91* 1.17  GLUCOSE 105* 97 144*   Electrolytes  Recent Labs Lab 06/04/15 0443 06/08/15 2053 06/09/15 0354 06/10/15 0400  CALCIUM 9.3 8.2* 7.6* 8.4*  MG 1.9  --   --   --    Sepsis Markers  Recent Labs Lab 06/08/15 0030 06/08/15 2053  06/09/15 0354  LATICACIDVEN 1.6 1.64 1.1  PROCALCITON  --  <0.10  --    ABG No results for input(s): PHART, PCO2ART, PO2ART in the last 168 hours. Liver Enzymes  Recent Labs Lab 06/09/15 0354  AST 16  ALT 13*  ALKPHOS 77  BILITOT 0.8  ALBUMIN 3.3*   Cardiac Enzymes  Recent Labs Lab 06/09/15 0354  TROPONINI 0.04*   Glucose  Recent Labs Lab 06/09/15 0618 06/09/15 0822 06/09/15 1019 06/09/15 1236 06/09/15 1624 06/09/15 2152  GLUCAP 111* 166* 165* 145* 133* 150*    Imaging 11/30 CXR images personally reviewed> bibasilar atelectasis, no effusion or consolidation   STUDIES:  11/23 echo > Poor image qualithy LV is dilated with severe decrease in LV function.  CULTURES: BCx2 11/28 >>>  ANTIBIOTICS: Zosyn 11/28 >>> Vancomycin 11/28 >>> 11/29  SIGNIFICANT EVENTS: 11/21-25 > admit for hypoglycemia, aflutter, CAP 11/29 pressors off  LINES/TUBES:   DISCUSSION: 59 year old male with history of CVA, DM, recent A flutter. He was admitted on 11/28 with septic shock from a UTI and is now hemodynamically stable.  Acute kidney injury has resolved.  ASSESSMENT / PLAN:  PULMONARY A: Dysphagia?> Had a choking on medications overnight No evidence of pneumonia on CXR this morning  P:   SLP eval  CARDIOVASCULAR A:  Shock> resolved Atrial flutter with RVR, now tachycardic Chronic systolic/diastolic CHF (LV severe limitation of function, no EF estimated)  CAD  P:  KVO fluids Restart metoprolol  RENAL A:   Acute renal failure (Baseline SCr 1.1) > resolved  P:   Trend renal function and UOP Correct electrolytes as indicated  GASTROINTESTINAL A:   GERD  P:   NPO for now except for meds Protonix  HEMATOLOGIC A:   Anemia, acute suspect hemodilutional in nature Chronic anticoagulation with Eliquis due to chronic AFlutter  P:  Continue Eliquis Follow CBC  INFECTIOUS A:   UTI, culture pending (note: he had a MDR proteus UTI in 07/2014)  P:    Trend WBC and fever curve Follow cultures Narrow zosyn as soon as culture results back    ENDOCRINE A:   DM2, poorly controlled  P:   Monitor glucose  NEUROLOGIC A:    Acute encephalopathy. resolved  P:   No sedating medications  Will transfer to floor PCCM to sign off  Heber [place], MD [place] PCCM Pager: [phone] Cell: [phone] After 3pm or if no response, call [phone]

## 2015-06-10 NOTE — Progress Notes (Signed)
Patient heart rhythm is a-flutter at baseline. Had an increase in HR to 140's-150's sustaining briefly. Oxygen saturation also briefly decreased during this time to 79% on room air.  Pt.asymptomatic resting in bed at the time, denies chest pain. Oxygen at 2 L [place] placed. Oxygen saturation increased to 97% on 2 L . VS and EKG was done. On call Mid-level provider with [facility] was called and notified. New orders were written.

## 2015-06-10 NOTE — Progress Notes (Signed)
eLink Physician-Brief Progress Note Patient Name: Clayton [name] DOB: [birth year] MRN: [id number]   Date of Service  06/10/2015  HPI/Events of Note  Urine Culture from 06/09/2015 is positive for Yeast (>100K). Foley catheter changed 06/09/2015.  eICU Interventions  Will order: 1. Diflucan 200 mg IV now and Q day.      Intervention Category Major Interventions: Infection - evaluation and management  [name] 06/10/2015, 9:32 PM

## 2015-06-10 NOTE — Evaluation (Signed)
Physical Therapy Evaluation Patient Details Name: ANTWINE AGOSTO MRN: [id number] DOB: 04-Feb-1956 Today's Date: 06/10/2015   History of Present Illness  Pt is a 59 year old male admitted 06/01/15 for hypoglycemia, probable right lower lobe pneumonia, new onset atrial flutter.. Was discharged 06/05/15. Returned 06/08/15  with hypo glycemia, sepsis UTI. PMH: Multiple CVA's, CAD, MI, DM, neuropathy, HTN, HLD, Lt BKA 2013, Rt transmetatarsal amputation 2008  Clinical Impression  Patient just DC'd from  Hospital. Should be able to return to ALF. Has been getting HH services there. Patiebnt's HR jumps into 130's with effortful mobility of supine to sit and back to supine. HR about 100 with sitting and resting.  Pt admitted with above diagnosis. Pt currently with functional limitations due to the deficits listed below (see PT Problem List). Pt will benefit from skilled PT to increase their independence and safety with mobility to allow discharge to the venue listed below.       Follow Up Recommendations SNF    Equipment Recommendations  None recommended by PT    Recommendations for Other Services       Precautions / Restrictions Precautions Precautions: Fall Precaution Comments: needs shoes and prosthesis, monitor stas and HR, hi      Mobility  Bed Mobility Overal bed mobility: Needs Assistance Bed Mobility: Supine to Sit;Sit to Supine     Supine to sit: HOB elevated;Mod assist Sit to supine: HOB elevated;Mod assist   General bed mobility comments: increased time and effort, assist for scooting out to EOB, pt reports he has a webbed  "ladder" at the  foot of the bed to use to pull up.  Transfers                 General transfer comment: did not attempt due to increased HR to 130's with any extra efforts as well as  the bed height is not safe.  Ambulation/Gait                Stairs            Wheelchair Mobility    Modified Rankin (Stroke Patients Only)        Balance   Sitting-balance support: Feet supported;Bilateral upper extremity supported Sitting balance-Leahy Scale: Fair                                       Pertinent Vitals/Pain Pain Assessment: Faces Faces Pain Scale: Hurts little more Pain Location: buttock Pain Descriptors / Indicators: Discomfort Pain Intervention(s): Limited activity within patient's tolerance;Repositioned    Home Living Family/patient expects to be discharged to:: Assisted living Living Arrangements: Other (Comment) Available Help at Discharge: Available PRN/intermittently Type of Home: Independent living facility         Home Equipment: Dan Humphreys - 2 wheels;Shower seat;Bedside commode;Electric scooter;Wheelchair - manual;Grab bars - toilet;Grab bars - tub/shower;Hospital bed Additional Comments: Pt lives in retirement community in an apt [employer] [place]`Has been having HHPT and exercise group.    Prior Function Level of Independence: Independent with assistive device(s)         Comments: pt states he can stand/squat pivot (described both) transfer to electric scooter without physical assist, states he can dress independently however reports assist for bathing 2x/week     Hand Dominance   Dominant Hand: Right    Extremity/Trunk Assessment   Upper Extremity Assessment: Generalized weakness  RLE Deficits / Details: transmet amputation LLE Deficits / Details: L BKA, also reports residual weakness on this side from CVA  Cervical / Trunk Assessment: Normal  Communication   Communication: Expressive difficulties  Cognition Arousal/Alertness: Awake/alert Behavior During Therapy: WFL for tasks assessed/performed Overall Cognitive Status: Within Functional Limits for tasks assessed                 General Comments: slowly answers questions    General Comments      Exercises        Assessment/Plan    PT Assessment Patient needs continued  PT services  PT Diagnosis Generalized weakness   PT Problem List Decreased strength;Decreased range of motion;Decreased activity tolerance;Decreased balance;Decreased mobility;Decreased knowledge of precautions;Decreased safety awareness;Decreased knowledge of use of DME  PT Treatment Interventions DME instruction;Functional mobility training;Patient/family education;Therapeutic activities;Therapeutic exercise;Balance training   PT Goals (Current goals can be found in the Care Plan section) Acute Rehab PT Goals Patient Stated Goal: to return to ALF. PT Goal Formulation: With patient    Frequency Min 2X/week   Barriers to discharge        Co-evaluation               End of Session   Activity Tolerance: Patient tolerated treatment well Patient left: in bed;with call bell/phone within reach;with bed alarm set Nurse Communication: Mobility status         Time: [id number] PT Time Calculation (min) (ACUTE ONLY): 33 min   Charges:   PT Evaluation $Initial PT Evaluation Tier I: 1 Procedure PT Treatments $Therapeutic Activity: 8-22 mins   PT G Codes:        Rada HayHill, Jovanna Hodges Elizabeth 06/10/2015, 4:51 PM Blanchard KelchKaren Julio Storr PT [phone]

## 2015-06-10 NOTE — Progress Notes (Signed)
PT Cancellation Note  Patient Details Name: Clayton [name] MRN: [id number] DOB: July 20, 1955   Cancelled Treatment:    Reason Eval/Treat Not Completed: Medical issues which prohibited therapy (in AM noted HR 140. RN stated to check back  this PM after meds given.)   Rada HayHill, [name] 06/10/2015, 11:59 AM

## 2015-06-10 NOTE — Progress Notes (Signed)
Patient heart rhythm at baseline remains a-flutter. HR began to increase to 140-150's sustaining briefly. Oxygen saturation remained above 90% on 2 L [place]. Pt.asymptomatic and is resting in bed. Paged and notified on call Mid-level provider with [facility].

## 2015-06-10 NOTE — Progress Notes (Signed)
Patient ID: Clayton [name], male   DOB: 1956/01/21, 59 y.o.   MRN: [id number]  [facility] PROGRESS NOTE  Clayton [name] NWG:[id number] DOB: 11/12/1955 DOA: 06/08/2015 PCP: [name] [name], PA-C   Brief narrative:    59 year old male with PMH as below, which includes CVA, CAD, DM, HTN, Atrial flutter, and GERD, bilateral BKA, resident of [facility], recently admitted from 11/21-11/25 for hypoglycemia, hospital course was complicated by new onset atrial flutter and RLL PNA. He was treated with levaquin. He was also started on eliquis for anti-coagulation. He was discharged on levaquin with proposed stop date 11/29, however, on 11/28 he again presented to ED with hypoglycemia, CBG's in 40's and cough.    Assessment/Plan:    Principal Problem:   Hypoglycemia - improving  - advance diet if pt able to tolerate - SSI per protocol   Active Problems:   Hypotensive shock - unclear etiology at this time, ? Cardiogenic vs hypovolemic, septic - resolved, SBP > 110 in the past 24 hours  - currently on vanc and zosyn day #3, continue Zosyn but will stop Vanc - follow up on urine and blood cultures     Atrial flutter with RVR - rate in 110 - 120's this AM - monitor on telemetry     Chronic systolic/diastolic CHF  - LV severe limitation of function, no EF estimated - monitor for volume overload - weight trend since admission:109 kg --> 107 kg this AM - strict I/O and daily weights     DM type 2 with diabetic peripheral neuropathy (HCC) - holding insulin for now until CBG more stable - SSI - continue Neurontin     Thrombocytopenia - from acute illness - no bleeding - CBC in AM    AKI (acute kidney injury) (HCC) - resolved with IVF - Cr is now WNL - BMP in AM    Morbid obesity - Body mass index is 36.78 kg/(m^2).    Acute blood loss anemia - suspect hemodilutional in nature - on Chronic eliquis due to chronic AFlutter  DVT prophylaxis - on Eliquis   Code Status:  Full.  Family Communication:  plan of care discussed with the patient Disposition Plan: Keep in SDU for now, no plan for d/c soon, HR up this AM  IV access:  Peripheral IV  Procedures and diagnostic studies:     Dg Chest Portable 1 View 06/08/2015  Vascular congestion and borderline cardiomegaly. Lungs remain grossly clear.  Dg Chest Port 1 View 06/01/2015 Developing patchy airspace opacity in the lingula may reflect atelectasis or developing infiltrate. 2. Otherwise, stable chest x-ray compared to earlier today.  Medical Consultants:  PCCM  Other Consultants:  PT  IAnti-Infectives:   Vanc 11/28 --> Zosyn 11/28 -->  Debbora Presto, MD  [facility] Pager [phone]  If 7PM-7AM, please contact night-coverage [email or web address] Password [facility] 06/10/2015, 4:35 PM   LOS: 2 days   HPI/Subjective: No events overnight.   Objective: Filed Vitals:   06/10/15 0600 06/10/15 0800 06/10/15 0945 06/10/15 1000  BP: 137/74 121/84 101/85 137/37  Pulse:   120   Temp:  98.4 F (36.9 C)    TempSrc:  Oral    Resp: Height:      Weight:      SpO2: 97% 99%  100%    Intake/Output Summary (Last 24 hours) at 06/10/15 1635 Last data filed at 06/10/15 1410  Gross per 24 hour  Intake   2535 ml  Output   2850 ml  Net   -315 ml    Exam:   General:  Pt is alert, follows commands appropriately, not in acute distress  Cardiovascular: Regular rhythm, tachycardic, S1/S2, no murmurs, no rubs, no gallops  Respiratory: Clear to auscultation bilaterally, no wheezing, no crackles, no rhonchi  Abdomen: Soft, non tender, non distended, bowel sounds present, no guarding  Extremities: bilateral BKA  Data Reviewed: Basic Metabolic Panel:  Recent Labs Lab 06/04/15 0443 06/08/15 2053 06/09/15 0354 06/10/15 0400  NA 141 135 134* 136  K 4.2 4.3 4.4 4.3  CL 99* 101 104 107  CO2 32 22 21* 23  GLUCOSE 206* 105* 97 144*  BUN 33* 54* 50* 30*  CREATININE 1.15 2.64* 1.91* 1.17  CALCIUM  9.3 8.2* 7.6* 8.4*  MG 1.9  --   --   --    Liver Function Tests:  Recent Labs Lab 06/09/15 0354  AST 16  ALT 13*  ALKPHOS 77  BILITOT 0.8  PROT 6.3*  ALBUMIN 3.3*   CBC:  Recent Labs Lab 06/08/15 2053 06/09/15 0354 06/10/15 0400  WBC 13.4* 10.9* 6.8  NEUTROABS 10.5*  --  5.1  HGB 13.3 11.1* 10.8*  HCT 40.1 34.1* 33.4*  MCV 88.5 88.1 87.7  PLT 199 155 129*   Cardiac Enzymes:  Recent Labs Lab 06/09/15 0354  TROPONINI 0.04*   CBG:  Recent Labs Lab 06/09/15 1236 06/09/15 1624 06/09/15 2152 06/10/15 0758 06/10/15 1222  GLUCAP 145* 133* 150* 121* 126*    Recent Results (from the past 240 hour(s))  MRSA PCR Screening     Status: None   Collection Time: 06/01/15  5:13 PM  Result Value Ref Range Status   MRSA by PCR NEGATIVE NEGATIVE Final  Culture, blood (routine x 2)     Status: None   Collection Time: 06/02/15  8:56 AM  Result Value Ref Range Status   Specimen Description BLOOD RIGHT ARM  Final   Special Requests BOTTLES DRAWN AEROBIC AND ANAEROBIC 8CC  Final   Culture   Final    NO GROWTH 5 DAYS [facility]formed [facility]    Report Status 06/07/2015 FINAL  Final  Culture, blood (routine x 2)     Status: None   Collection Time: 06/02/15  9:10 AM  Result Value Ref Range Status   Specimen Description BLOOD RIGHT HAND  Final   Special Requests IN PEDIATRIC BOTTLE 3CC  Final   Culture   Final    NO GROWTH 5 DAYS Performed at [facility]    Report Status 06/07/2015 FINAL  Final  Culture, blood (x 2)     Status: None (Preliminary result)   Collection Time: 06/08/15 11:12 PM  Result Value Ref Range Status   Specimen Description BLOOD BLOOD LEFT FOREARM  Final   Special Requests BOTTLES DRAWN AEROBIC AND ANAEROBIC 5CC  Final   Culture   Final    NO GROWTH 1 DAY Performed at [facility]    Report Status PENDING  Incomplete  Culture, blood (x 2)     Status: None (Preliminary result)   Collection Time: 06/09/15 12:30 AM   Result Value Ref Range Status   Specimen Description BLOOD LEFT HAND  Final   Special Requests BOTTLES DRAWN AEROBIC ONLY 7CC  Final   Culture   Final    NO GROWTH 1 DAY [facility]formed [facility]     Report Status PENDING  Incomplete  Urine culture  Status: None   Collection Time: 06/09/15  7:42 AM  Result Value Ref Range Status   Specimen Description URINE, CATHETERIZED  Final   Special Requests NONE  Final   Culture   Final    >=100,000 COLONIES/mL YEAST Performed at [facility]    Report Status 06/10/2015 FINAL  Final     Scheduled Meds: . apixaban  5 mg Oral BID  . aspirin EC  81 mg Oral Daily  . atorvastatin  40 mg Oral q1800  . cholecalciferol  2,000 Units Oral BID  . dextromethorphan-guaiFENesin  1 tablet Oral BID  . dextrose  1 ampule Intravenous Once  . gabapentin  600 mg Oral QID  . metoprolol tartrate  25 mg Oral BID  . piperacillin-tazobactam (ZOSYN)  IV  3.375 g Intravenous 3 times per day   Continuous Infusions: . sodium chloride 75 mL/hr at 06/10/15 0837  . phenylephrine (NEO-SYNEPHRINE) Adult infusion Stopped (06/09/15 1330)

## 2015-06-10 NOTE — Progress Notes (Signed)
OT Cancellation Note  Patient Details Name: Clayton [name] MRN: [id number] DOB: Jul 11, 1956   Cancelled Treatment:    Reason Eval/Treat Not Completed: Medical issues which prohibited therapy.  Resting HR in the 140s.  Clayton [name] 06/10/2015, 10:01 AM  [name], OTR/L [phone] 06/10/2015

## 2015-06-10 NOTE — Clinical Social Work Note (Addendum)
Clinical Social Work Assessment  Patient Details  Name: Clayton [name] MRN: [id number] Date of Birth: 05/16/1956  Date of referral:  06/10/15               Reason for consult:  Discharge Planning                Permission sought to share information with:    Permission granted to share information::     Name::        Agency::     Relationship::     Contact Information:     Housing/Transportation Living arrangements for the past 2 months:  [employer] of Information:  Patient Patient Interpreter Needed:  None Criminal Activity/Legal Involvement Pertinent to Current Situation/Hospitalization:  No - Comment as needed Significant Relationships:  Siblings Lives with:  Self Do you feel safe going back to the place where you live?  Yes Need for family participation in patient care:  No (Coment)  Care giving concerns:  No concerns reported at this time.   Social Worker assessment / plan:  Pt hospitalized at [facility] on  06/01/15 and was d/c home on 06/05/15. Pt re hospitalized on 06/08/15 with Hypoglycemia. CSW consulted to assist with d/c planning. PN notes reviewed. PT/OT consults are pending.  CSW met with pt at bedside. No family present at this time. Possibility of ST Rehab placement discussed with pt. Pt will consider this option but would like to work with PT first to see how he does with therapy. CSW will return following PT recommendations to continue assisting with d/c planning.  Employment status:  Disabled (Comment on whether or not currently receiving Disability) Insurance information:  Managed Medicare PT Recommendations:  Not assessed at this time Information / Referral to community resources:  [place]  Patient/Family's Response to care:  Disposition to be determined  Patient/Family's Understanding of and Emotional Response to Diagnosis, Current Treatment, and Prognosis:  Pt is disappointed that he is back in the hospital so soon after  discharging. " I don't know what my plans are right now. " PT recommendations will be reviewed with pt once available. CSW will assist with SNF placement if recommended by PT and pt is in agreement with this plan.   Emotional Assessment Appearance:  Appears stated age Attitude/Demeanor/Rapport:  Other (cooperative) Affect (typically observed):  Quiet, Calm Orientation:  Oriented to Self, Oriented to Place, Oriented to  Time, Oriented to Situation Alcohol / Substance use:  Not Applicable Psych involvement (Current and /or in the community):  No (Comment)  Discharge Needs  Concerns to be addressed:  Discharge Planning Concerns Readmission within the last 30 days:  Yes Current discharge risk:  None Barriers to Discharge:  No Barriers Identified   Luretha Rued, [place] 06/10/2015, 1:10 PM

## 2015-06-10 NOTE — Evaluation (Signed)
Clinical/Bedside Swallow Evaluation Patient Details  Name: Clayton [name] MRN: [id number] Date of Birth: [birth year]  Today's Date: 06/10/2015 Time: SLP Start Time (ACUTE ONLY): 1235 SLP Stop Time (ACUTE ONLY): 1300 SLP Time Calculation (min) (ACUTE ONLY): 25 min  Past Medical History:  Past Medical History  Diagnosis Date  . Stroke (HCC)   . Coronary artery disease   . Diabetes mellitus without complication (HCC)   . Hypertension   . MI (myocardial infarction) (HCC)   . Hyperlipidemia   . GERD (gastroesophageal reflux disease)   . Diabetic neuropathy (HCC)   . Diabetic retinopathy (HCC)   . PNA (pneumonia)    Past Surgical History:  Past Surgical History  Procedure Laterality Date  . Below knee leg amputation Left   . Appendectomy    . Percutaneous placement intravascular stent cervical carotid artery Right    HPI:  59 year old malewith past medical history of hypertension, hyperlipidemia, CVA, GERD, status post intracranial stent, coronary artery disease, left BKA, right transmetatarsal amputation with recent admit for hypoglycemia - admitted 06/08/15 with hypoglycemia.  CXR 11/21 developing patchy airspace opacity in the lingula may reflect atelectasis or developing infiltrate. CXR 06/10/15 negative.  MBS 08/12/14 acute-on-chronic dysphagia with high penetration of nectar, trace aspiration of thin with large sips. Dys 3 thin with small sips recommended.  Pt coughed with medicine given with liquids today per RN- resulting in mild distress= face turning red per RN.     Assessment / Plan / Recommendation Clinical Impression  Pt again has chronic dysphagia from prior CVAs - he reports dysphagia dates back at least to 2007.  Pt observed eating lunch - wet vocal quality noted intermittently - which pt reports to be present over the last few weeks with and without intake.  Cues to clear throat helpful to diminish wet vocal quality.    No overt coughing/choking with intake SLP observed  (chips, water, sandwich) however pt reports he occasionally senses residuals in throat with solids.  Pt states he has to expel food into oral cavity from throat and reswallow to clear.  SLP suspects pt's lack of adequate upper dentition impacts mastication abilities.  Pt also observed to take large bites during session - advised to chew well and eat slowly.    SLP reviewed prior MBS from 08/2014 with pt as well as precautions to mitigate dysphagia/aspiration risk.  Provided precautions in writing and verbally to pt.  Note most recent CXR negative, however will follow up x1 to assure tolerance given pt report of deficits.    Pt reports he also has GERD = and he has taken a reflux medicine within the last few months.  He denies active reflux symptoms.    Encouraged pt to use precautions/compensations - especially while acutely ill - in efforts to decrease dysphagia symptoms.      Aspiration Risk  Moderate aspiration risk    Diet Recommendation Regular;Thin liquid   Medication Administration: Whole meds with puree Supervision: Intermittent supervision to cue for compensatory strategies Compensations: Small sips/bites;Slow rate;Other (Comment) (expectorate as needed, clear throat if voice is wet/gurgly) Postural Changes: Seated upright at 90 degrees    Other  Recommendations Oral Care Recommendations: Oral care BID   Follow up Recommendations  None    Frequency and Duration min 1 x/week  1 week       Prognosis Prognosis for Safe Diet Advancement: Good Barriers to Reach Goals: Time post onset;Motivation      Swallow Study   General Date of Onset:  06/10/15 HPI: 59 year old malewith past medical history of hypertension, hyperlipidemia, CVA, GERD, status post intracranial stent, coronary artery disease, left BKA, right transmetatarsal amputation with recent admit for hypoglycemia - admitted 06/08/15 with hypoglycemia.  CXR 11/21 developing patchy airspace opacity in the lingula may reflect  atelectasis or developing infiltrate. CXR 06/10/15 negative.  MBS 08/12/14 acute-on-chronic dysphagia with high penetration of nectar, trace aspiration of thin with large sips. Dys 3 thin with small sips recommended.  Pt coughed with medicine given with liquids today per RN- resulting in mild distress= face turning red per RN.   Type of Study: Bedside Swallow Evaluation Previous Swallow Assessment:  (see HPI) Diet Prior to this Study: Regular;Thin liquids Temperature Spikes Noted: No Respiratory Status: Room air History of Recent Intubation: No Behavior/Cognition: Alert;Cooperative;Pleasant mood Oral Cavity Assessment: Within Functional Limits Oral Care Completed by SLP: No Oral Cavity - Dentition:  (natural lower, missing some uppers) Vision: Functional for self-feeding Self-Feeding Abilities: Able to feed self;Needs set up Patient Positioning: Upright in bed Baseline Vocal Quality: Normal;Other (comment) (occasionally wet) Volitional Cough: Strong Volitional Swallow: Able to elicit    Oral/Motor/Sensory Function Overall Oral Motor/Sensory Function: Within functional limits   Ice Chips     Thin Liquid Thin Liquid: Impaired Presentation: Cup;Self Fed Oral Phase Functional Implications: Prolonged oral transit Pharyngeal  Phase Impairments: Wet Vocal Quality;Decreased hyoid-laryngeal movement Other Comments: cues to throat clear effective     Nectar Thick Nectar Thick Liquid: Not tested   Honey Thick Honey Thick Liquid: Not tested   Puree Puree: Not tested   Solid Solid: Impaired Presentation: Self Fed Oral Phase Impairments: Impaired mastication - poor dentition     Clayton [name], Clayton [name] [facility] SLP [phone]

## 2015-06-11 DIAGNOSIS — E1142 Type 2 diabetes mellitus with diabetic polyneuropathy: Secondary | ICD-10-CM

## 2015-06-11 DIAGNOSIS — I4892 Unspecified atrial flutter: Secondary | ICD-10-CM

## 2015-06-11 DIAGNOSIS — I5021 Acute systolic (congestive) heart failure: Secondary | ICD-10-CM

## 2015-06-11 DIAGNOSIS — I5032 Chronic diastolic (congestive) heart failure: Secondary | ICD-10-CM

## 2015-06-11 LAB — GLUCOSE, CAPILLARY
GLUCOSE-CAPILLARY: 180 mg/dL — AB (ref 65–99)
GLUCOSE-CAPILLARY: 143 mg/dL — AB (ref 65–99)
GLUCOSE-CAPILLARY: 178 mg/dL — AB (ref 65–99)
Glucose-Capillary: 133 mg/dL — ABNORMAL HIGH (ref 65–99)
Glucose-Capillary: 233 mg/dL — ABNORMAL HIGH (ref 65–99)

## 2015-06-11 LAB — BASIC METABOLIC PANEL
ANION GAP: 8 (ref 5–15)
Anion gap: 5 (ref 5–15)
BUN: 21 mg/dL — ABNORMAL HIGH (ref 6–20)
BUN: 17 mg/dL (ref 6–20)
CALCIUM: 8.7 mg/dL — AB (ref 8.9–10.3)
CALCIUM: 8.7 mg/dL — AB (ref 8.9–10.3)
CO2: 25 mmol/L (ref 22–32)
CO2: 21 mmol/L — AB (ref 22–32)
CREATININE: 0.92 mg/dL (ref 0.61–1.24)
Chloride: 111 mmol/L (ref 101–111)
Chloride: 109 mmol/L (ref 101–111)
Creatinine, Ser: 0.91 mg/dL (ref 0.61–1.24)
GFR calc non Af Amer: >60 mL/min (ref >60)
GLUCOSE: 150 mg/dL — AB (ref 65–99)
Glucose, Bld: 142 mg/dL — ABNORMAL HIGH (ref 65–99)
Potassium: 4.2 mmol/L (ref 3.5–5.1)
Potassium: 4.4 mmol/L (ref 3.5–5.1)
SODIUM: 138 mmol/L (ref 135–145)
Sodium: 141 mmol/L (ref 135–145)

## 2015-06-11 LAB — CBC
HEMATOCRIT: 34.7 % — AB (ref 39.0–52.0)
Hemoglobin: 11 g/dL — ABNORMAL LOW (ref 13.0–17.0)
MCH: 28.6 pg (ref 26.0–34.0)
MCHC: 31.7 g/dL (ref 30.0–36.0)
MCV: 90.1 fL (ref 78.0–100.0)
PLATELETS: 138 10*3/uL — AB (ref 150–400)
RBC: 3.85 MIL/uL — ABNORMAL LOW (ref 4.22–5.81)
RDW: 16.1 % — AB (ref 11.5–15.5)
WBC: 7.9 10*3/uL (ref 4.0–10.5)

## 2015-06-11 MED ORDER — DILTIAZEM HCL 25 MG/5ML IV SOLN
15.0000 mg | Freq: Once | INTRAVENOUS | Status: AC
  Administered 2015-06-11: 15 mg via INTRAVENOUS
  Filled 2015-06-11: qty 5

## 2015-06-11 MED ORDER — METOPROLOL TARTRATE 50 MG PO TABS
50.0000 mg | ORAL_TABLET | Freq: Two times a day (BID) | ORAL | Status: DC
Start: 2015-06-11 — End: 2015-06-11
  Administered 2015-06-11: 50 mg via ORAL
  Filled 2015-06-11 (×3): qty 1

## 2015-06-11 MED ORDER — METOPROLOL TARTRATE 50 MG PO TABS
75.0000 mg | ORAL_TABLET | Freq: Two times a day (BID) | ORAL | Status: DC
  Administered 2015-06-11 – 2015-06-12 (×2): 75 mg via ORAL
  Filled 2015-06-11 (×3): qty 1

## 2015-06-11 NOTE — Progress Notes (Signed)
CSW met with pt this am to review PT recommendations prior to pt transferring to 1520. Pt reports that he plans to return to his independent apt at [facility]. Pt did give CSW permission to initiate SNF search in case his plans change, though pt reports he doesn't expect to change his mind and is declining SNF placement at this time.  Jamie Haidinger LCSW [phone] 

## 2015-06-11 NOTE — NC FL2 (Signed)
[place] MEDICAID FL2 LEVEL OF CARE SCREENING TOOL     IDENTIFICATION  Patient Name: Clayton [name] Birthdate: 04-08-1956 Sex: male Admission Date (Current Location): 06/08/2015  [facility] [place]     Facility and Address:  [facility],  [address], [place] [id number]      Provider Number: [id number]  Attending Physician Name and Address:  Eddie NorthNishant Dhungel, MD  Relative Name and Phone Number:       Current Level of Care: Hospital Recommended Level of Care: Skilled Nursing Facility Prior Approval Number:    Date Approved/Denied:   PASRR Number: [id number]  Discharge Plan: SNF    Current Diagnoses: Patient Active Problem List   Diagnosis Date Noted  . Chronic diastolic congestive heart failure (HCC) 06/09/2015  . Shock (HCC)   . Community acquired pneumonia 06/08/2015  . AKI (acute kidney injury) (HCC) 06/08/2015  . Sepsis (HCC) 06/08/2015  . Hypoglycemia 06/01/2015  . Hypoxia 06/01/2015  . Bronchitis 06/01/2015  . H/O: stroke 08/10/2014  . CAD (coronary artery disease) 08/10/2014  . DM type 2 with diabetic peripheral neuropathy (HCC) 08/10/2014  . HLD (hyperlipidemia) 08/10/2014  . GERD (gastroesophageal reflux disease) 08/10/2014  . Arterial hypotension   . Slurred speech     Orientation ACTIVITIES/SOCIAL BLADDER RESPIRATION    Self, Time, Situation, Place  Passive Indwelling catheter O2 (As needed)  BEHAVIORAL SYMPTOMS/MOOD NEUROLOGICAL BOWEL NUTRITION STATUS   (no behaviors)   Continent Diet  PHYSICIAN VISITS COMMUNICATION OF NEEDS Height & Weight Skin  60 days Verbally 5\' 8"  (172.7 cm) 240 lbs. Normal          AMBULATORY STATUS RESPIRATION    Assist extensive O2 (As needed)      Personal Care Assistance Level of Assistance  Bathing, Feeding, Dressing (no beaviors) Bathing Assistance: Limited assistance Feeding assistance: Independent Dressing Assistance: Limited assistance      Functional Limitations Info   Sight, Hearing, Speech Sight Info: Adequate Hearing Info: Adequate Speech Info: Adequate       SPECIAL CARE FACTORS FREQUENCY  PT (By licensed PT), OT (By licensed OT)     PT Frequency: 5 x wk OT Frequency: 5 x wk           Additional Factors Info  Code Status, Insulin Sliding Scale Code Status Info: Full Code             Current Medications (06/11/2015):  This is the current hospital active medication list Current Facility-Administered Medications  Medication Dose Route Frequency Provider Last Rate Last Dose  . acetaminophen (TYLENOL) tablet 325 mg  325 mg Oral Q6H PRN Lorretta HarpXilin Niu, MD      . albuterol (PROVENTIL) (2.5 MG/3ML) 0.083% nebulizer solution 2.5 mg  2.5 mg Nebulization Q6H PRN Lorretta HarpXilin Niu, MD      . apixaban Everlene Balls(ELIQUIS) tablet 5 mg  5 mg Oral BID Lorretta HarpXilin Niu, MD   5 mg at 06/10/15 2157  . aspirin EC tablet 81 mg  81 mg Oral Daily Lorretta HarpXilin Niu, MD   81 mg at 06/10/15 0947  . atorvastatin (LIPITOR) tablet 40 mg  40 mg Oral q1800 Lorretta HarpXilin Niu, MD   40 mg at 06/10/15 1823  . cholecalciferol (VITAMIN D) tablet 2,000 Units  2,000 Units Oral BID Lorretta HarpXilin Niu, MD   2,000 Units at 06/10/15 2157  . dextromethorphan-guaiFENesin (MUCINEX DM) 30-600 MG per 12 hr tablet 1 tablet  1 tablet Oral BID Lorretta HarpXilin Niu, MD   1 tablet at 06/10/15 2157  . dextrose  50 % solution 25 mL  25 mL Intravenous PRN Lorretta Harp, MD      . dextrose 50 % solution 50 mL  1 ampule Intravenous Once Lorretta Harp, MD   50 mL at 06/08/15 2300  . fluconazole (DIFLUCAN) IVPB 200 mg  200 mg Intravenous Q24H Karl Ito, MD   200 mg at 06/10/15 2158  . gabapentin (NEURONTIN) capsule 600 mg  600 mg Oral QID Lorretta Harp, MD   600 mg at 06/10/15 2157  . HYDROcodone-acetaminophen (NORCO/VICODIN) 5-325 MG per tablet 1 tablet  1 tablet Oral Q6H PRN Lorretta Harp, MD   1 tablet at 06/11/15 0003  . metoprolol tartrate (LOPRESSOR) tablet 50 mg  50 mg Oral BID Nishant Dhungel, MD      . ondansetron (ZOFRAN) tablet 4 mg  4 mg Oral Q6H PRN Lorretta Harp, MD       Or  . ondansetron [facility]) injection 4 mg  4 mg Intravenous Q6H PRN Lorretta Harp, MD      . phenylephrine (NEO-SYNEPHRINE) 10 mg in dextrose 5 % 250 mL (0.04 mg/mL) infusion  30-200 mcg/min Intravenous Titrated Lupita Leash, MD   Stopped at 06/09/15 1330  . piperacillin-tazobactam (ZOSYN) IVPB 3.375 g  3.375 g Intravenous 3 times per day Lorretta Harp, MD   3.375 g at 06/11/15 0450     Discharge Medications: Please see discharge summary for a list of discharge medications.  Relevant Imaging Results:  Relevant Lab Results:  Recent Labs    Additional Information SS # [id number].  MDRO ( urine ) no encounter 08/14/14 contact precautions.  Weslee Fogg, Dickey Gave, LCSW

## 2015-06-11 NOTE — Progress Notes (Signed)
OT Cancellation Note  Patient Details Name: Clayton [name] MRN: [id number] DOB: 1956-06-26   Cancelled Treatment:    Reason Eval/Treat Not Completed: Medical issues which prohibited therapy.  Pt with high HR (156). Will check back later today or tomorrow.  Clayton [name] 06/11/2015, 11:44 AM  Clayton [name], OTR/L [phone] 06/11/2015

## 2015-06-11 NOTE — Care Management Important Message (Signed)
Important Message  Patient Details IM Letter given to Rhonda/Case Manager to present to PatientImportant Message  Patient Details  Name: Clayton [name] MRN: [id number] Date of Birth: 1955/11/22   Medicare Important Message Given:  Yes    Haskell FlirtJamison, Emberleigh Reily 06/11/2015, 12:21 PM Name: Clayton [name] MRN: [id number] Date of Birth: 1955/11/22   Medicare Important Message Given:  Yes    Haskell FlirtJamison, Tranae Laramie 06/11/2015, 12:20 PM

## 2015-06-11 NOTE — Progress Notes (Signed)
[facility] PROGRESS NOTE  Clayton [name] ZOX:[id number] DOB: 1956/06/07 DOA: 06/08/2015 PCP: [name], [name], PA-C  brief narrative 59 year old male with PMH of CVA, CAD, DM, HTN, Atrial flutter (recently diagnosed and seen by Dr. Jacinto Halim with 2-D echo showing markedly reduced EF and mucus stress test showing significant cardiomyopathy with EF of 25 and 30%,GERD, left BKA, resident of [facility], recently admitted from 11/21-11/25 for hypoglycemia, hospital course was complicated by new onset atrial flutter and RLL PNA treated with Levaquin and started on  started on eliquis for anti-coagulation. He was discharged on levaquin with proposed stop date 11/29, however, on 11/28 he again presented to ED with hypoglycemia, CBG's in 40's and cough. Patient was hypertensive and required admission to stepdown.    Assessment/Plan: Recurrent hypoglycemia Unclear if this is associated with poor by mouth intake or ongoing hypoglycemic and insulin use. His A1c of 5.7. A.m. cortisol normal. Patient on short-acting insulin and metformin at home and given his recurrent hypoglycemic symptoms I will stop them completely. Continue sliding scale insulin monitoring. Diet advanced.  Active problem Hypotensive shock No clear etiology. Possibly associated with hypoglycemia. No  clear sign of infection. Empirically being covered with  Zosyn. Blood pressure now improved. I have discontinued IV fluids given his cardiomyopathy.  A flutter with RVR Heart rate in the 90s at rest and increases up to 150s with minimal exertion. His metoprolol was resumed at lower dose yesterday. I will increase the dose to 75 milligram twice a day. Thyroid function normal. Potassium normal. Check magnesium. -If symptoms persistent I will place him on a Cardizem drip and consult his cardiologist. -Patient started on Eliquis recently.  Acute systolic CHF/chronic diastolic CHF Severely impaired LV function per echo. EF of  25-percent as per recent stress test. Weight currently stable. Strict I/O and daily weight. On daily Lasix as outpatient which I will resume. Continue aspirin,  and Lipitor. Lisinopril held due to acute kidney injury.  Acute kidney injury Possibly secondary to hypotensive shock. Now resolved. Discontinued IV fluids  Type 2 diabetes mellitus with peripheral neuropathy Holding both insulin and metformin. Monitor on sliding CL. Continue Neurontin.  Thrombocytopenia Possibly associated with acute illness. Stable.   DVT prophylaxis: On anticoagulation Diet: Diabetic (patient refused to be on a heart healthy diet)  Code Status: Full code Family Communication: None at bedside Disposition Plan: Return to skilled nursing facility once improved   Consultants:  None  Procedures:  None  Antibiotics:   IV Zosyn 11/28  HPI/Subjective: Seen and examined. Denies any symptoms. Patient becomes tachycardic with SVT on the monitor up to 150s on minimal exertion.  Objective: Filed Vitals:   06/11/15 0800 06/11/15 0959  BP: 168/86 127/59  Pulse:    Temp:  99.2 F (37.3 C)  Resp: 13     Intake/Output Summary (Last 24 hours) at 06/11/15 1623 Last data filed at 06/11/15 8119  Gross per 24 hour  Intake 1523.75 ml  Output   2050 ml  Net -526.25 ml   Filed Weights   06/09/15 0500 06/10/15 0500 06/11/15 0447  Weight: 109.7 kg (241 lb 13.5 oz) 107 kg (235 lb 14.3 oz) 109.1 kg (240 lb 8.4 oz)    Exam:   General:  Elderly obese male lying in bed not in distress  HEENT: No pallor, moist oral mucosa, supple neck  Chest: Clear to auscultation bilaterally   CVS: S1 and S2 tachycardic, systolic murmur 3/6  GI: Soft, nondistended, nontender, bowel sounds present  musculoskeletal: Warm, no edema, left BKA  CNS: Alert and oriented  Data Reviewed: Basic Metabolic Panel:  Recent Labs Lab 06/08/15 2053 06/09/15 0354 06/10/15 0400 06/11/15 0340 06/11/15 1145  NA 135 134*  136 141 138  K 4.3 4.4 4.3 4.2 4.4  CL 101 104 107 111 109  CO2 22 21* 23 25 21*  GLUCOSE 105* 97 144* 150* 142*  BUN 54* 50* 30* 21* 17  CREATININE 2.64* 1.91* 1.17 0.92 0.91  CALCIUM 8.2* 7.6* 8.4* 8.7* 8.7*   Liver Function Tests:  Recent Labs Lab 06/09/15 0354  AST 16  ALT 13*  ALKPHOS 77  BILITOT 0.8  PROT 6.3*  ALBUMIN 3.3*   No results for input(s): LIPASE, AMYLASE in the last 168 hours. No results for input(s): AMMONIA in the last 168 hours. CBC:  Recent Labs Lab 06/08/15 2053 06/09/15 0354 06/10/15 0400 06/11/15 0340  WBC 13.4* 10.9* 6.8 7.9  NEUTROABS 10.5*  --  5.1  --   HGB 13.3 11.1* 10.8* 11.0*  HCT 40.1 34.1* 33.4* 34.7*  MCV 88.5 88.1 87.7 90.1  PLT 199 155 129* 138*   Cardiac Enzymes:  Recent Labs Lab 06/09/15 0354  TROPONINI 0.04*   BNP (last 3 results)  Recent Labs  03/23/15 1630 06/01/15 1235 06/08/15 2053  BNP 112.3* 58.5 81.2    ProBNP (last 3 results) No results for input(s): PROBNP in the last 8760 hours.  CBG:  Recent Labs Lab 06/10/15 1222 06/10/15 1632 06/10/15 2148 06/11/15 0830 06/11/15 1205  GLUCAP 126* 212* 183* 133* 143*    Recent Results (from the past 240 hour(s))  MRSA PCR Screening     Status: None   Collection Time: 06/01/15  5:13 PM  Result Value Ref Range Status   MRSA by PCR NEGATIVE NEGATIVE Final    Comment:        The GeneXpert MRSA Assay (FDA approved for NASAL specimens only), is one component of a comprehensive MRSA colonization surveillance program. It is not intended to diagnose MRSA infection nor to guide or monitor treatment for MRSA infections.   Culture, blood (routine x 2)     Status: None   Collection Time: 06/02/15  8:56 AM  Result Value Ref Range Status   Specimen Description BLOOD RIGHT ARM  Final   Special Requests BOTTLES DRAWN AEROBIC AND ANAEROBIC 8CC  Final   Culture   Final    NO GROWTH 5 DAYS Performed at [facility]    Report Status 06/07/2015 FINAL   Final  Culture, blood (routine x 2)     Status: None   Collection Time: 06/02/15  9:10 AM  Result Value Ref Range Status   Specimen Description BLOOD RIGHT HAND  Final   Special Requests IN PEDIATRIC BOTTLE 3CC  Final   Culture   Final    NO GROWTH 5 DAYS Performed at [facility]    Report Status 06/07/2015 FINAL  Final  Culture, blood (x 2)     Status: None (Preliminary result)   Collection Time: 06/08/15 11:12 PM  Result Value Ref Range Status   Specimen Description BLOOD BLOOD LEFT FOREARM  Final   Special Requests BOTTLES DRAWN AEROBIC AND ANAEROBIC 5CC  Final   Culture   Final    NO GROWTH 2 DAYS Performed at [facility]    Report Status PENDING  Incomplete  Culture, blood (x 2)     Status: None (Preliminary result)   Collection Time: 06/09/15 12:30 AM  Result Value Ref Range Status   Specimen Description BLOOD LEFT HAND  Final   Special Requests BOTTLES DRAWN AEROBIC ONLY 7CC  Final   Culture   Final    NO GROWTH 2 DAYS Performed at [facility]    Report Status PENDING  Incomplete  Urine culture     Status: None   Collection Time: 06/09/15  7:42 AM  Result Value Ref Range Status   Specimen Description URINE, CATHETERIZED  Final   Special Requests NONE  Final   Culture   Final    >=100,000 COLONIES/mL YEAST Performed at [facility]     Report Status 06/10/2015 FINAL  Final     Studies: [place] Renal  06/09/2015  CLINICAL DATA:  Acute renal insufficiency EXAM: RENAL / URINARY TRACT ULTRASOUND COMPLETE COMPARISON:  None. FINDINGS: Right Kidney: Length: 13.9 cm. Echogenicity within normal limits. No mass or hydronephrosis visualized. Left Kidney: Length: 12.8 cm. Echogenicity within normal limits. No hydronephrosis visualized. There is a 1.6 x 1 x 1.2 cm cyst in the upper pole left kidney. Bladder: Decompressed with Foley catheter in place limiting evaluation. IMPRESSION: No acute abnormality. Electronically Signed   By: Sherian ReinWei-Chen  Lin M.D.    On: 06/09/2015 16:33   Dg Chest Port 1 View  06/10/2015  CLINICAL DATA:  59 year old male status post aspiration. Cough congestion. Initial encounter. EXAM: PORTABLE CHEST 1 VIEW COMPARISON:  06/08/2015 and earlier. FINDINGS: Portable AP semi upright view at 0506 hours. Continued low lung volumes. Stable cardiac size and mediastinal contours. Sequelae of CABG. No pneumothorax, pulmonary edema or consolidation. Stable pulmonary vascularity, no acute edema. IMPRESSION: Stable.  No acute cardiopulmonary abnormality. Electronically Signed   By: Odessa FlemingH  Hall M.D.   On: 06/10/2015 07:11    Scheduled Meds: . apixaban  5 mg Oral BID  . aspirin EC  81 mg Oral Daily  . atorvastatin  40 mg Oral q1800  . cholecalciferol  2,000 Units Oral BID  . dextromethorphan-guaiFENesin  1 tablet Oral BID  . dextrose  1 ampule Intravenous Once  . fluconazole (DIFLUCAN) IV  200 mg Intravenous Q24H  . gabapentin  600 mg Oral QID  . metoprolol tartrate  75 mg Oral BID  . piperacillin-tazobactam (ZOSYN)  IV  3.375 g Intravenous 3 times per day   Continuous Infusions:     Time spent: 35 minutes    Gita Dilger  [facility] Pager [phone] If 7PM-7AM, please contact night-coverage at [email or web address], password [email or web address] 06/11/2015, 4:23 PM  LOS: 3 days

## 2015-06-12 DIAGNOSIS — I429 Cardiomyopathy, unspecified: Secondary | ICD-10-CM

## 2015-06-12 LAB — GLUCOSE, CAPILLARY
Glucose-Capillary: 132 mg/dL — ABNORMAL HIGH (ref 65–99)
Glucose-Capillary: 134 mg/dL — ABNORMAL HIGH (ref 65–99)
Glucose-Capillary: 185 mg/dL — ABNORMAL HIGH (ref 65–99)

## 2015-06-12 MED ORDER — DEXTROSE 5 % IV SOLN
5.0000 mg/h | INTRAVENOUS | Status: DC
  Filled 2015-06-12: qty 100

## 2015-06-12 MED ORDER — METOPROLOL TARTRATE 50 MG PO TABS
100.0000 mg | ORAL_TABLET | Freq: Two times a day (BID) | ORAL | Status: DC
  Filled 2015-06-12: qty 1

## 2015-06-12 MED ORDER — FUROSEMIDE 10 MG/ML IJ SOLN
40.0000 mg | Freq: Once | INTRAMUSCULAR | Status: AC
  Administered 2015-06-12: 40 mg via INTRAVENOUS
  Filled 2015-06-12: qty 4

## 2015-06-12 MED ORDER — METOPROLOL TARTRATE 25 MG PO TABS
25.0000 mg | ORAL_TABLET | Freq: Two times a day (BID) | ORAL | Status: DC
  Administered 2015-06-12 – 2015-06-13 (×2): 25 mg via ORAL
  Filled 2015-06-12 (×3): qty 1

## 2015-06-12 MED ORDER — DIGOXIN 0.25 MG/ML IJ SOLN
0.5000 mg | Freq: Once | INTRAMUSCULAR | Status: AC
  Administered 2015-06-12: 0.5 mg via INTRAVENOUS
  Filled 2015-06-12: qty 2

## 2015-06-12 MED ORDER — FUROSEMIDE 40 MG PO TABS
40.0000 mg | ORAL_TABLET | Freq: Every day | ORAL | Status: DC
  Administered 2015-06-12 – 2015-06-13 (×2): 40 mg via ORAL
  Filled 2015-06-12 (×2): qty 1

## 2015-06-12 NOTE — Progress Notes (Signed)
Speech Language Pathology Treatment: Dysphagia  Patient Details Name: Clayton [name] MRN: [id number] DOB: Dec 14, 1955 Today's Date: 06/12/2015 Time: 2080-2233 SLP Time Calculation (min) (ACUTE ONLY): 25 min  Assessment / Plan / Recommendation Clinical Impression  SLP observed pt consuming medicine with icecream and water provided by RN.  No indications of airway compromise, however he required total cues to follow compensation strategies.    He reports "unless he needs it", he does not follow strategies - stating he needs it when he "feels as if he may choke".  Pt able to verbalize need to turn head left and tuck chin - advised him to conduct these strategies in hopes to prevent "choking" symptoms.    Despite pt's noncompliance with strategies, he appears to be tolerating po intake.  SLP reviewed with pt importance of keeping "hock" and "cough" strong to protect his airway as suspect pt with some chronic low grade aspiration.  CXR negative 06/10/15 - however pt with suspected pna earlier in November.     Oral suction in room and pt reported frequent "coughing" last evening with expectoration of secretions.    All education completed to help mitigate dysphagia.  Recommend follow up at SNF to maximize swallow rehab/airway protection with intake.    Recommend to continue diet.    HPI HPI: 59 year old malewith past medical history of hypertension, hyperlipidemia, CVA, GERD, status post intracranial stent, coronary artery disease, left BKA, right transmetatarsal amputation with recent admit for hypoglycemia - admitted 06/08/15 with hypoglycemia.  CXR 11/21 developing patchy airspace opacity in the lingula may reflect atelectasis or developing infiltrate. CXR 06/10/15 negative.  MBS 08/12/14 acute-on-chronic dysphagia with high penetration of nectar, trace aspiration of thin with large sips. Dys 3 thin with small sips recommended.  Pt coughed with medicine given with liquids today per RN- resulting in  mild distress= face turning red per RN.        SLP Plan  All goals met     Recommendations  Medication Administration: Whole meds with puree Supervision: Intermittent supervision to cue for compensatory strategies Compensations: Small sips/bites;Slow rate;Other (Comment);Chin tuck (clear throat/cough to clear secretions as needed) Postural Changes and/or Swallow Maneuvers: Head turn left during swallow;Chin tuck;Seated upright 90 degrees;Upright 30-60 min after meal              Oral Care Recommendations: Oral care BID Follow up Recommendations: None Plan: All goals met   Clayton [name], Clayton [name] [facility] SLP [phone]

## 2015-06-12 NOTE — Progress Notes (Addendum)
Pt chooses a bed at [facility].   CSW left a message for the admissions coordinator accepting bed offer.   CSW also accepted offer in the HUB and sent updated clinicals.   CSW contacted Pt's APS worker Arnetha CourserCarol Wright [phone] and left a message letting case worker know that Pt will not be going back to his independent living, but instead will be going to a SNF.   CSW attempted to contact [facility] [employer] to provide information for auth, however was unable to get in touch with a representative.   CSW received call back from [place] [facility]. CSW provided Pt information and spoke with Wandra MannanZack Brooks (supervisor) concerning a 5 day LOG so that Pt will be able to d/c to facility this weekend. LOG was approved. Pt will be able to [facility] when medically ready.   CSW will provide information to weekend CSW for FU.    Leron Croakassandra Chike Farrington [facility]  [facility]  [phone]

## 2015-06-12 NOTE — Progress Notes (Signed)
Patient's HR sustaining in the 150s with no c/o pain, SOB, etc. Notified MD. New orders placed and cardiology consult.

## 2015-06-12 NOTE — Progress Notes (Signed)
Physical Therapy Treatment Patient Details Name: Clayton [name] MRN: [id number] DOB: 14-Mar-1956 Today's Date: 06/12/2015    History of Present Illness Pt is a 59 year old male admitted 06/01/15 for hypoglycemia, probable right lower lobe pneumonia, new onset atrial flutter.. Was discharged 06/05/15. Returned 06/08/15  with hypo glycemia, sepsis UTI. PMH: Multiple CVA's, CAD, MI, DM, neuropathy, HTN, HLD, Lt BKA 2013, Rt transmetatarsal amputation 2008    PT Comments    Pt. Requiring min assist for supine to sit to bring trunk upright and BLE OOB; pt. Performing standing/squatting pivot transfer with WC in front of him - use of bed rails and WC arm rests and min assist at end of transfer to ensure pts buttocks safely arrives at the middle of the seated surface being transferred to; able to don/doff prosthesis with min assist; WC mobility in hallway ~100' with use of BUE and BLE; multiple rest breaks as pt. Stated he was not used to doing all of this (has power WC and manual with elevating leg rests at ALF) Pt. Appears at level appropriate for return to ALF - will consult with LPT    Follow Up Recommendations  Home health PT (at ALF)  pt appears at a level to return to his ALF   Equipment Recommendations  None recommended by PT    Recommendations for Other Services       Precautions / Restrictions Precautions Precautions: Fall Precaution Comments: needs shoes and prosthesis, monitor stas and HR, hi Restrictions Weight Bearing Restrictions: No    Mobility  Bed Mobility Overal bed mobility: Needs Assistance Bed Mobility: Supine to Sit     Supine to sit: HOB elevated;Mod assist  assisted with donning and doffing L prosthesis and R shoe EOB   General bed mobility comments: increased time and effort, assist to bring trunk upright and navigate BLE to EOB; able to self scoot in bed today   Transfers Overall transfer level: Needs assistance Equipment used:  (uses bed rails and WC arm  rests for SPT) Transfers: Stand Pivot Transfers;Squat Pivot Transfers   Stand pivot transfers: Min assist;Min guard Squat pivot transfers: Min assist;Min guard     General transfer comment: min guard through whole transfer for safety; min assist required towards end of transfer to externally guide backside to ensure safely sitting in middle of surface   Ambulation/Gait             General Gait Details: did not attempt   Research scientist (physical sciences) Wheelchair mobility: Yes Wheelchair propulsion: Both upper extremities;Both lower extermities Wheelchair parts: Supervision/cueing Distance: 100 (>3 rest breaks ) Wheelchair Assistance Details (indicate cue type and reason): pt. stated he has manual and power WC to get around; utilizes leg rests on manual at home - had him try to use BLE for this treat; limited endurance with self propel - multiple rest breaks   Modified Rankin (Stroke Patients Only)       Balance                                    Cognition Arousal/Alertness: Awake/alert Behavior During Therapy: WFL for tasks assessed/performed Overall Cognitive Status: Within Functional Limits for tasks assessed                      Exercises      General  Comments        Pertinent Vitals/Pain Pain Assessment: No/denies pain    Home Living                      Prior Function            PT Goals (current goals can now be found in the care plan section) Acute Rehab PT Goals Time For Goal Achievement: 06/12/15 Potential to Achieve Goals: Good Progress towards PT goals: Progressing toward goals    Frequency  Min 2X/week    PT Plan      Co-evaluation             End of Session Equipment Utilized During Treatment: Gait belt;Other (comment) (prosthesis for LLE and shoe insert for RLE) Activity Tolerance: Patient tolerated treatment well Patient left: in bed;with call bell/phone within  reach;with bed alarm set     Time: 1530-1600 PT Time Calculation (min) (ACUTE ONLY): 30 min  Charges:  $Therapeutic Activity: 8-22 mins $Wheel Chair Management: 8-22 mins                    G CodesMarin Comment:      Morgan Salzler-Albaugh, SPTA   06/12/2015 4:30 PM   Pager: [phone]   Reviewed and agree with above Felecia ShellingLori Pahoua Schreiner  PTA [facility]  Acute  Rehab Pager      [phone]

## 2015-06-12 NOTE — Progress Notes (Signed)
OT Cancellation Note  Patient Details Name: Clayton [name] MRN: [id number] DOB: 20-Aug-1955   Cancelled Treatment:    Reason Eval/Treat Not Completed: Medical issues which prohibited therapy.  HR remains in the 150s. Will check back.  Everette Mall 06/12/2015, 11:01 AM  Marica OtterMaryellen Vylette Strubel, OTR/L [phone] 06/12/2015

## 2015-06-12 NOTE — Progress Notes (Signed)
12 lead EKG reviewed. HR of 77 with a flutter.

## 2015-06-12 NOTE — Progress Notes (Signed)
[facility] PROGRESS NOTE  Clayton [name] ZOX:[id number] DOB: 1956/02/13 DOA: 06/08/2015 PCP: [name], [name], PA-C  brief narrative 59 year old male with PMH of CVA, CAD, DM, HTN, Atrial flutter (recently diagnosed and seen by Dr. Jacinto Halim with 2-D echo showing markedly reduced EF and mucus stress test showing significant cardiomyopathy with EF of 25 and 30%,GERD, left BKA, resident of [facility], recently admitted from 11/21-11/25 for hypoglycemia, hospital course was complicated by new onset atrial flutter and RLL PNA treated with Levaquin and started on  started on eliquis for anti-coagulation. He was discharged on levaquin with proposed stop date 11/29, however, on 11/28 he again presented to ED with hypoglycemia, CBG's in 40's and cough. Patient was hypertensive and required admission to stepdown.    Assessment/Plan: Recurrent hypoglycemia Unclear if this is associated with poor by mouth intake or ongoing hypoglycemic and insulin use. His A1c of 5.7. A.m. cortisol normal. Patient on short-acting insulin and metformin at home and given his recurrent hypoglycemic symptoms I will stop them completely. Continue sliding scale insulin monitoring. Diet advanced.  Active problem Hypotensive shock No clear etiology. Possibly associated with hypoglycemia. No  clear sign of infection. Empirically being covered with  Zosyn. Blood pressure now improved. I have discontinued IV fluids given his cardiomyopathy.  A flutter with RVR Heart rate in 70-80.  His metoprolol was increased but upon discussion with his cardiologist recommend that since his heart rate is normal and only shows flutter on telemetry should keep metoprolol at a low-dose to prevent hypoglycemia and bradycardia. He would need to see Dr. Ladona Ridgel for cardiac ablation as outpatient. (Has appointment on 12/16 at 2:30 PM.) Continue anticoagulation. Dr. Jacinto Halim recommends against escalating his beta blocker.  Acute systolic  CHF/chronic diastolic CHF Severely impaired LV function per echo. EF of 25%  as per recent stress test. Weight currently stable. Strict I/O and daily weight. On daily Lasix as outpatient which I will resume. Continue aspirin,  and Lipitor. Lisinopril held due to acute kidney injury.  Acute kidney injury Possibly secondary to hypotensive shock. Now resolved. Discontinued IV fluids  Type 2 diabetes mellitus with peripheral neuropathy Holding both insulin and metformin. Monitor on sliding  scale. Continue Neurontin.  Thrombocytopenia Possibly associated with acute illness. Stable.   DVT prophylaxis: On anticoagulation Diet: Diabetic (patient refused to be on a heart healthy diet)  Code Status: Full code Family Communication: None at bedside Disposition Plan: Return to skilled nursing facility possibly on 12/3    Consultants:  None  Procedures:  None  Antibiotics:   IV Zosyn 11/28  HPI/Subjective: Seen and examined. Denies any symptoms. Patient becomes tachycardic with flutter on the monitor with HR in 150s. Denies any symptoms.  Objective: Filed Vitals:   06/12/15 0643 06/12/15 1333  BP: 150/80 118/64  Pulse: 50 76  Temp: 98.2 F (36.8 C) 98.2 F (36.8 C)  Resp: 16 16    Intake/Output Summary (Last 24 hours) at 06/12/15 1511 Last data filed at 06/12/15 1406  Gross per 24 hour  Intake    340 ml  Output   3675 ml  Net  -3335 ml   Filed Weights   06/09/15 0500 06/10/15 0500 06/11/15 0447  Weight: 109.7 kg (241 lb 13.5 oz) 107 kg (235 lb 14.3 oz) 109.1 kg (240 lb 8.4 oz)    Exam:   General:  Elderly obese male lying in bed not in distress  HEENT: No pallor, moist oral mucosa, supple neck  Chest: Clear to auscultation  bilaterally   CVS: S1 and S2  irregularly irregular, systolic murmur 3/6  GI: Soft, nondistended, nontender, bowel sounds present    musculoskeletal: Warm, no edema, left BKA, s/p rt foot ray amputation  CNS: Alert and oriented  Data  Reviewed: Basic Metabolic Panel:  Recent Labs Lab 06/08/15 2053 06/09/15 0354 06/10/15 0400 06/11/15 0340 06/11/15 1145  NA 135 134* 136 141 138  K 4.3 4.4 4.3 4.2 4.4  CL 101 104 107 111 109  CO2 22 21* 23 25 21*  GLUCOSE 105* 97 144* 150* 142*  BUN 54* 50* 30* 21* 17  CREATININE 2.64* 1.91* 1.17 0.92 0.91  CALCIUM 8.2* 7.6* 8.4* 8.7* 8.7*   Liver Function Tests:  Recent Labs Lab 06/09/15 0354  AST 16  ALT 13*  ALKPHOS 77  BILITOT 0.8  PROT 6.3*  ALBUMIN 3.3*   No results for input(s): LIPASE, AMYLASE in the last 168 hours. No results for input(s): AMMONIA in the last 168 hours. CBC:  Recent Labs Lab 06/08/15 2053 06/09/15 0354 06/10/15 0400 06/11/15 0340  WBC 13.4* 10.9* 6.8 7.9  NEUTROABS 10.5*  --  5.1  --   HGB 13.3 11.1* 10.8* 11.0*  HCT 40.1 34.1* 33.4* 34.7*  MCV 88.5 88.1 87.7 90.1  PLT 199 155 129* 138*   Cardiac Enzymes:  Recent Labs Lab 06/09/15 0354  TROPONINI 0.04*   BNP (last 3 results)  Recent Labs  03/23/15 1630 06/01/15 1235 06/08/15 2053  BNP 112.3* 58.5 81.2    ProBNP (last 3 results) No results for input(s): PROBNP in the last 8760 hours.  CBG:  Recent Labs Lab 06/11/15 1205 06/11/15 1824 06/11/15 2137 06/12/15 0754 06/12/15 1453  GLUCAP 143* 178* 233* 132* 134*    Recent Results (from the past 240 hour(s))  Culture, blood (x 2)     Status: None (Preliminary result)   Collection Time: 06/08/15 11:12 PM  Result Value Ref Range Status   Specimen Description BLOOD BLOOD LEFT FOREARM  Final   Special Requests BOTTLES DRAWN AEROBIC AND ANAEROBIC 5CC  Final   Culture   Final    NO GROWTH 3 DAYS [facility]    Report Status PENDING  Incomplete  Culture, blood (x 2)     Status: None (Preliminary result)   Collection Time: 06/09/15 12:30 AM  Result Value Ref Range Status   Specimen Description BLOOD LEFT HAND  Final   Special Requests BOTTLES DRAWN AEROBIC ONLY 7CC  Final   Culture   Final     NO GROWTH 3 DAYS [facility]formed [facility]    Report Status PENDING  Incomplete  Urine culture     Status: None   Collection Time: 06/09/15  7:42 AM  Result Value Ref Range Status   Specimen Description URINE, CATHETERIZED  Final   Special Requests NONE  Final   Culture   Final    >=100,000 COLONIES/mL YEAST Performed at [facility]    Report Status 06/10/2015 FINAL  Final     Studies: No results found.  Scheduled Meds: . apixaban  5 mg Oral BID  . aspirin EC  81 mg Oral Daily  . atorvastatin  40 mg Oral q1800  . cholecalciferol  2,000 Units Oral BID  . dextromethorphan-guaiFENesin  1 tablet Oral BID  . dextrose  1 ampule Intravenous Once  . fluconazole (DIFLUCAN) IV  200 mg Intravenous Q24H  . gabapentin  600 mg Oral QID  . metoprolol  25 mg  Oral BID  . piperacillin-tazobactam (ZOSYN)  IV  3.375 g Intravenous 3 times per day   Continuous Infusions:     Time spent: 25 minutes    Rocsi Hazelbaker  [facility] Pager [phone] If 7PM-7AM, please contact night-coverage at [email or web address], password [facility] 06/12/2015, 3:11 PM  LOS: 4 days

## 2015-06-13 DIAGNOSIS — I9589 Other hypotension: Secondary | ICD-10-CM

## 2015-06-13 DIAGNOSIS — B3749 Other urogenital candidiasis: Secondary | ICD-10-CM | POA: Diagnosis present

## 2015-06-13 DIAGNOSIS — R579 Shock, unspecified: Secondary | ICD-10-CM

## 2015-06-13 DIAGNOSIS — I4892 Unspecified atrial flutter: Secondary | ICD-10-CM | POA: Diagnosis present

## 2015-06-13 DIAGNOSIS — I429 Cardiomyopathy, unspecified: Secondary | ICD-10-CM

## 2015-06-13 LAB — GLUCOSE, CAPILLARY
Glucose-Capillary: 184 mg/dL — ABNORMAL HIGH (ref 65–99)
Glucose-Capillary: 289 mg/dL — ABNORMAL HIGH (ref 65–99)

## 2015-06-13 MED ORDER — INSULIN ASPART 100 UNIT/ML ~~LOC~~ SOLN
8.0000 [IU] | Freq: Once | SUBCUTANEOUS | Status: AC
  Administered 2015-06-13: 8 [IU] via SUBCUTANEOUS

## 2015-06-13 MED ORDER — FLUCONAZOLE 200 MG PO TABS: 200.0000 mg | ORAL_TABLET | Freq: Every day | ORAL | Status: AC

## 2015-06-13 MED ORDER — INSULIN ASPART 100 UNIT/ML ~~LOC~~ SOLN: 0.0000 [IU] | Freq: Three times a day (TID) | SUBCUTANEOUS | Status: DC

## 2015-06-13 MED ORDER — FLUCONAZOLE 200 MG PO TABS
200.0000 mg | ORAL_TABLET | Freq: Every day | ORAL | Status: DC
  Filled 2015-06-13: qty 1

## 2015-06-13 MED ORDER — HYDROCODONE-ACETAMINOPHEN 5-325 MG PO TABS: 1.0000 | ORAL_TABLET | Freq: Three times a day (TID) | ORAL | Status: DC | PRN

## 2015-06-13 NOTE — Progress Notes (Signed)
OT Note  Patient Details Name: Clayton KidaLarry K [name] MRN: [id number] DOB: February 12, 1956   Cancelled Treatment:    Reason Eval/Treat Not Completed: Other (comment) (patient has discharge orders to SNF today. Will defer OT needs to SNF.)  Jasmina Gendron A 06/13/2015, 1:29 PM

## 2015-06-13 NOTE — Discharge Summary (Addendum)
Physician Discharge Summary  Clayton [name] ZOX:[id number] DOB: [birth year] DOA: 06/08/2015  PCP: [name], [name], PA-C  Admit date: 06/08/2015 Discharge date: 06/13/2015  Time spent: 35 minutes  Recommendations for Outpatient Follow-up:  1. Discharged to skilled nursing facility 2. Patient has appointment to see Dr. Ladona Ridgelaylor for cardiac ablation on 06/26/2015 at 2:30 PM 3. Patient with seizures cardiologist Dr. Jacinto HalimGanji in 2 weeks. 4. Please monitor for hypoglycemic episodes and hypertension.   Discharge Diagnoses:  Principal Problem:   Shock (HCC)   Active Problems:   H/O: stroke   CAD (coronary artery disease)   DM type 2 with diabetic peripheral neuropathy (HCC)   HLD (hyperlipidemia)   Arterial hypotension   GERD (gastroesophageal reflux disease)   Hypoglycemia   Community acquired pneumonia   AKI (acute kidney injury) (HCC)   Sepsis (HCC)   Chronic diastolic congestive heart failure (HCC)   Atrial flutter (HCC)   Cardiomyopathy (HCC)   Discharge Condition: Fair  CODE STATUS: Full code  Filed Weights   06/09/15 0500 06/10/15 0500 06/11/15 0447  Weight: 109.7 kg (241 lb 13.5 oz) 107 kg (235 lb 14.3 oz) 109.1 kg (240 lb 8.4 oz)    History of present illness:  59 year old male with PMH of CVA, CAD, DM, HTN, Atrial flutter (recently diagnosed and seen by Dr. Jacinto HalimGanji with 2-D echo showing markedly reduced EF and mucus stress test showing significant cardiomyopathy with EF of 25 and 30%,GERD, left BKA, [facility], recently admitted from 11/21-11/25 for hypoglycemia, hospital course was complicated by new onset atrial flutter and RLL PNA treated with Levaquin and started on started on eliquis for anti-coagulation. He was discharged on levaquin with proposed stop date 11/29, however, on 11/28 he again presented to ED with hypoglycemia, CBG's in 40's and cough. Patient was hypertensive and required admission to stepdown.   Hospital Course:  Recurrent  hypoglycemia Unclear if this is associated with poor by mouth intake or ongoing hypoglycemic and insulin use. His A1c of 5.7. A.m. cortisol normal. Patient on short-acting insulin and metformin at home and given his recurrent hypoglycemic symptoms I have stopped them completely. Discharge on sensitive sliding scale insulin only.  Active problem Hypotensive shock No clear etiology. Possibly associated with hypoglycemia. No clear sign of infection except for UTI with yeast. Empirically  covered with Zosyn. (Received for 5 days). Treating with 7 day course of fluconazole. Blood remains stable.  A flutter with RVR Heart rate in 70-80. Cussed with his cardiologist recommend to continue low-dose metoprolol as blood pressure permits and recommends against escalating his beta blocker given her risk of hypoglycemia and hypotension.Marland Kitchen. He would need to see Dr. Ladona Ridgelaylor for cardiac ablation as outpatient. (Has appointment on 12/16 at 2:30 PM.) Continue anticoagulation.  -Dr. Jacinto HalimGanji see him in 2 weeks   Acute systolic CHF/chronic diastolic CHF Severely impaired LV function per echo. EF of 25% as per recent stress test. Weight currently stable.  Continue aspirin, and Lipitor. He 6 and Lisinopril held due to acute kidney injury and resumed upon discharge.  Acute kidney injury Possibly secondary to hypotensive shock. Now resolved.   Type 2 diabetes mellitus with peripheral neuropathy Holding both insulin and metformin. Discharged on sensitive sliding scale insulin.. Continue Neurontin.  Thrombocytopenia Possibly associated with acute illness. Stable.  Chronic foley  patient presented with a foley catheter. i do not know the duration has been on it. Please address as outpatient.  Diet: Diabetic/heart healthy  Code Status: Full code Family Communication:  None at bedside Disposition Plan: Return to skilled nursing facility    Consultants:  None  Procedures:  None  Antibiotics:  IV  Zosyn 11/28-12/2 fluoconazole 12/1-12/7  Discharge Exam: Filed Vitals:   06/12/15 2157 06/13/15 0545  BP: 127/68 130/73  Pulse: 79 77  Temp: 98.1 F (36.7 C) 98.3 F (36.8 C)  Resp: 16 16     General: Elderly obese male lying in bed not in distress  HEENT: No pallor, moist oral mucosa, supple neck  Chest: Clear to auscultation bilaterally   CVS: S1 and S2 irregularly irregular, systolic murmur 3/6  GI: Soft, nondistended, nontender, bowel sounds present , foley in place   musculoskeletal: Warm, no edema, left BKA, s/p rt midfoot amputation  CNS: Alert and oriented  Discharge Instructions    Current Discharge Medication List    CONTINUE these medications which have CHANGED   Details  HYDROcodone-acetaminophen (NORCO/VICODIN) 5-325 MG tablet Take 1 tablet by mouth 3 (three) times daily as needed for moderate pain. Qty: 10 tablet, Refills: 0      CONTINUE these medications which have NOT CHANGED   Details  acetaminophen (TYLENOL) 325 MG tablet Take 325 mg by mouth every 6 (six) hours as needed for moderate pain.    albuterol (PROVENTIL HFA;VENTOLIN HFA) 108 (90 BASE) MCG/ACT inhaler Inhale 1-2 puffs into the lungs every 6 (six) hours as needed for wheezing or shortness of breath.    apixaban (ELIQUIS) 5 MG TABS tablet Take 1 tablet (5 mg total) by mouth 2 (two) times daily. Qty: 60 tablet, Refills: 0    aspirin EC 81 MG tablet Take 1 tablet (81 mg total) by mouth daily. Qty: 30 tablet, Refills: 0    atorvastatin (LIPITOR) 40 MG tablet Take 40 mg by mouth daily.    cholecalciferol (VITAMIN D) 1000 UNITS tablet Take 2,000 Units by mouth 2 (two) times daily.    furosemide (LASIX) 40 MG tablet Take 1 tablet (40 mg total) by mouth daily. Qty: 30 tablet, Refills: 0    gabapentin (NEURONTIN) 600 MG tablet Take 600 mg by mouth 4 (four) times daily.     lisinopril (PRINIVIL,ZESTRIL) 2.5 MG tablet Take 2.5 mg by mouth daily.    metoprolol (LOPRESSOR) 50 MG  tablet Take 25 mg by mouth 2 (two) times daily.      STOP taking these medications     insulin aspart (NOVOLOG) 100 UNIT/ML injection      insulin glargine (LANTUS) 100 UNIT/ML injection      levofloxacin (LEVAQUIN) 500 MG tablet      metFORMIN (GLUCOPHAGE) 1000 MG tablet      traMADol (ULTRAM) 50 MG tablet        Allergies  Allergen Reactions  . Niaspan [Niacin Er] Other (See Comments)    Face was red and hot   Follow-up Information    Follow up with [facility].   Specialty:  Skilled Nursing Facility   Contact information:   [address] [place] [address] [phone]      Follow up with Lewayne Bunting, MD On 06/26/2015.   Specialty:  Cardiology   Why:  2:30 pm   Contact information:   [address] [place] [address] [phone]        The results of significant diagnostics from this hospitalization (including imaging, microbiology, ancillary and laboratory) are listed below for reference.    Significant Diagnostic Studies: Dg Chest 2 View  06/01/2015  CLINICAL DATA:  59 year old male with  cough, congestion and hypoglycemia EXAM: CHEST  2 VIEW COMPARISON:  Prior chest x-ray 03/23/2015 FINDINGS: Stable cardiomegaly. Patient is status post median sternotomy with evidence of prior multivessel CABG. Mild pulmonary vascular congestion without overt edema is similar compared to prior imaging. No focal airspace consolidation, pleural effusion or pneumothorax. No suspicious pulmonary nodule or mass. No acute osseous abnormality. Chronic bronchitic changes are also similar compared to prior. IMPRESSION: 1. Stable cardiomegaly and mild vascular congestion without overt edema. 2. No acute cardiopulmonary process. Electronically Signed   By: Malachy Moan M.D.   On: 06/01/2015 13:18   US Renal  06/09/2015  CLINICAL DATA:  Acute renal insufficiency EXAM: RENAL / URINARY TRACT ULTRASOUND COMPLETE COMPARISON:  None. FINDINGS:  Right Kidney: Length: 13.9 cm. Echogenicity within normal limits. No mass or hydronephrosis visualized. Left Kidney: Length: 12.8 cm. Echogenicity within normal limits. No hydronephrosis visualized. There is a 1.6 x 1 x 1.2 cm cyst in the upper pole left kidney. Bladder: Decompressed with Foley catheter in place limiting evaluation. IMPRESSION: No acute abnormality. Electronically Signed   By: Sherian Rein M.D.   On: 06/09/2015 16:33   Nm Myocar Multi W/spect W/wall Motion / Ef  06/03/2015  CLINICAL DATA:  Chest pain EXAM: MYOCARDIAL IMAGING WITH SPECT (REST AND PHARMACOLOGIC-STRESS) GATED LEFT VENTRICULAR WALL MOTION STUDY LEFT VENTRICULAR EJECTION FRACTION TECHNIQUE: Standard myocardial SPECT imaging was performed after resting intravenous injection of 10 mCi Tc-58m sestamibi. Subsequently, intravenous infusion of Lexiscan was performed under the supervision of the Cardiology staff. At peak effect of the drug, 30 mCi Tc-66m sestamibi was injected intravenously and standard myocardial SPECT imaging was performed. Quantitative gated imaging was also performed to evaluate left ventricular wall motion, and estimate left ventricular ejection fraction. COMPARISON:  None. FINDINGS: Perfusion: There is a large fixed defect in the apex and extending into the inferolateral wall. There is no stress-induced ischemia. Wall Motion: There is marked global hypokinesis with akinesis of the septum and inferior walls. Left Ventricular Ejection Fraction: 32 % End diastolic volume 102 ml End systolic volume 70 ml IMPRESSION: 1. No stress-induced ischemia. 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 2. Global hypokinesis with akinesis of the septum and inferior walls. 3. Left ventricular ejection fraction 32% 4. High-risk stress test findings*. *2012 Appropriate Use Criteria for Coronary Revascularization Focused Update: J Am Coll Cardiol. 2012;59(9):857-881.  http://content.dementiazones.com.aspx?articleid=[email or web address] Electronically Signed   By: Jolaine Click M.D.   On: 06/03/2015 13:41   Dg Chest Port 1 View  06/10/2015  CLINICAL DATA:  59 year old male status post aspiration. Cough congestion. Initial encounter. EXAM: PORTABLE CHEST 1 VIEW COMPARISON:  06/08/2015 and earlier. FINDINGS: Portable AP semi upright view at 0506 hours. Continued low lung volumes. Stable cardiac size and mediastinal contours. Sequelae of CABG. No pneumothorax, pulmonary edema or consolidation. Stable pulmonary vascularity, no acute edema. IMPRESSION: Stable.  No acute cardiopulmonary abnormality. Electronically Signed   By: Odessa Fleming M.D.   On: 06/10/2015 07:11   Dg Chest Portable 1 View  06/08/2015  CLINICAL DATA:  Slid out of bed. Acute onset of hypoglycemia. Initial encounter. EXAM: PORTABLE CHEST 1 VIEW COMPARISON:  Chest radiograph performed 06/05/2015 FINDINGS: The lungs are well-aerated. Vascular congestion is noted. There is no evidence of focal opacification, pleural effusion or pneumothorax. The cardiomediastinal silhouette is borderline enlarged. The patient is status post median sternotomy, with evidence of prior CABG. No acute osseous abnormalities are seen. IMPRESSION: Vascular congestion and borderline cardiomegaly. Lungs remain grossly clear. No displaced rib  fracture seen. Electronically Signed   By: Roanna Raider M.D.   On: 06/08/2015 21:35   Dg Chest Port 1 View  06/05/2015  CLINICAL DATA:  Coronary artery disease. EXAM: PORTABLE CHEST 1 VIEW COMPARISON:  06/01/2015. FINDINGS: Prior CABG. Cardiomegaly. No focal infiltrate. No pleural effusion or pneumothorax. No acute bony abnormality . IMPRESSION: 1. Prior CABG.  Cardiomegaly. 2. No focal pulmonary infiltrate. Interim clearing of lingular infiltrate. Electronically Signed   By: Maisie Fus  Register   On: 06/05/2015 07:23   Dg Chest Port 1 View  06/01/2015  CLINICAL DATA:  59 year old male with hypoglycemia,  confusion and productive cough EXAM: PORTABLE CHEST 1 VIEW COMPARISON:  Prior chest x-ray obtained earlier today FINDINGS: The patient is slightly rotated toward the right. Cardiac and mediastinal contours remain unchanged. Patient is status post median sternotomy with evidence of prior CABG. Developing patchy airspace opacity in the left lung base partially obscuring the cardiac margin. Otherwise, unchanged appearance of the lungs with chronic bronchitic change but otherwise clear. No acute osseous abnormality. IMPRESSION: 1. Developing patchy airspace opacity in the lingula may reflect atelectasis or developing infiltrate. 2. Otherwise, stable chest x-ray compared to earlier today. Electronically Signed   By: Malachy Moan M.D.   On: 06/01/2015 18:36    Microbiology: Recent Results (from the past 240 hour(s))  Culture, blood (x 2)     Status: None (Preliminary result)   Collection Time: 06/08/15 11:12 PM  Result Value Ref Range Status   Specimen Description BLOOD BLOOD LEFT FOREARM  Final   Special Requests BOTTLES DRAWN AEROBIC AND ANAEROBIC 5CC  Final   Culture   Final    NO GROWTH 3 DAYS Performed at [facility]    Report Status PENDING  Incomplete  Culture, blood (x 2)     Status: None (Preliminary result)   Collection Time: 06/09/15 12:30 AM  Result Value Ref Range Status   Specimen Description BLOOD LEFT HAND  Final   Special Requests BOTTLES DRAWN AEROBIC ONLY 7CC  Final   Culture   Final    NO GROWTH 3 DAYS Performed at [facility]    Report Status PENDING  Incomplete  Urine culture     Status: None   Collection Time: 06/09/15  7:42 AM  Result Value Ref Range Status   Specimen Description URINE, CATHETERIZED  Final   Special Requests NONE  Final   Culture   Final    >=100,000 COLONIES/mL YEAST Performed at [facility]    Report Status 06/10/2015 FINAL  Final     Labs: Basic Metabolic Panel:  Recent Labs Lab 06/08/15 2053 06/09/15 0354  06/10/15 0400 06/11/15 0340 06/11/15 1145  NA 135 134* 136 141 138  K 4.3 4.4 4.3 4.2 4.4  CL 101 104 107 111 109  CO2 22 21* 23 25 21*  GLUCOSE 105* 97 144* 150* 142*  BUN 54* 50* 30* 21* 17  CREATININE 2.64* 1.91* 1.17 0.92 0.91  CALCIUM 8.2* 7.6* 8.4* 8.7* 8.7*   Liver Function Tests:  Recent Labs Lab 06/09/15 0354  AST 16  ALT 13*  ALKPHOS 77  BILITOT 0.8  PROT 6.3*  ALBUMIN 3.3*   No results for input(s): LIPASE, AMYLASE in the last 168 hours. No results for input(s): AMMONIA in the last 168 hours. CBC:  Recent Labs Lab 06/08/15 2053 06/09/15 0354 06/10/15 0400 06/11/15 0340  WBC 13.4* 10.9* 6.8 7.9  NEUTROABS 10.5*  --  5.1  --   HGB 13.3  11.1* 10.8* 11.0*  HCT 40.1 34.1* 33.4* 34.7*  MCV 88.5 88.1 87.7 90.1  PLT 199 155 129* 138*   Cardiac Enzymes:  Recent Labs Lab 06/09/15 0354  TROPONINI 0.04*   BNP: BNP (last 3 results)  Recent Labs  03/23/15 1630 06/01/15 1235 06/08/15 2053  BNP 112.3* 58.5 81.2    ProBNP (last 3 results) No results for input(s): PROBNP in the last 8760 hours.  CBG:  Recent Labs Lab 06/12/15 0754 06/12/15 1453 06/12/15 1934 06/13/15 0752 06/13/15 1147  GLUCAP 132* 134* 185* 184* 289*       Signed:  Muna Demers  [facility] 06/13/2015, 12:51 PM

## 2015-06-13 NOTE — Progress Notes (Signed)
PHARMACIST - PHYSICIAN COMMUNICATION DR:   Dhungel CONCERNING: Antibiotic IV to Oral Route Change Policy  RECOMMENDATION: This patient is receiving Fluconazole by the intravenous route.  Based on criteria approved by the Pharmacy and Therapeutics Committee, the antibiotic(s) is/are being converted to the equivalent oral dose form(s).   DESCRIPTION: These criteria include:  Patient being treated for a respiratory tract infection, urinary tract infection, cellulitis or clostridium difficile associated diarrhea if on metronidazole  The patient is not neutropenic and does not exhibit a GI malabsorption state  The patient is eating (either orally or via tube) and/or has been taking other orally administered medications for a least 24 hours  The patient is improving clinically and has a Tmax < 100.5  If you have questions about this conversion, please contact the Pharmacy Department  []   [phone]( [phone] )  [facility] []   [phone]( [phone] )  [facility] []   [phone]( [phone] )  [facility] []   [phone]( [phone] )  [facility] [x]   [phone]( [phone] )  [facility]   Dorna LeitzAnh Osamu Olguin, PharmD, BCPS 06/13/2015 8:52 AM

## 2015-06-13 NOTE — Clinical Social Work Note (Signed)
CSW [facility] with muriel [facility] to assess for [facility] bed today.  Per Remi DeterMuriel, [facility] does not accept LOG's so they cannot accept pt today.  Elray Buba.Cove Haydon, LCSW [facility] Clinical Social Worker - Weekend Coverage cell #: [phone]

## 2015-06-13 NOTE — Progress Notes (Signed)
Called report to [facility] and gave to Short HillsAntoinette.  [phone].

## 2015-06-13 NOTE — Clinical Social Work Note (Signed)
CSW received call that pt was ready for discharge today.  CSW reviewed week day handoff reflecting that [facility] per [place]ynn.  [facility] and [facility] is off today.  [facility] is only admission on today and she is not in yet and facility does not know when she will be in.  CSW left message on her voicemail for call back  .Elray Bubaegina Ajax Schroll, LCSW [facility] Clinical Social Worker - Weekend Coverage cell #: [phone]

## 2015-06-13 NOTE — Clinical Social Work Note (Signed)
CSW [facility] who stated that they do not accept LOG's so no bed for pt today at this facility.  Elray Buba.Fedrick Cefalu, LCSW [facility] Clinical Social Worker - Weekend Coverage cell #: [phone]

## 2015-06-13 NOTE — Clinical Social Work Note (Signed)
[facility] to assess for bed availability.  Per Dois DavenportSandra who is the supervisor today, she will need to call her supervisor Herbert SetaHeather and have her call CSW but stated that she thought it was unlikely for today to admit.  Elray Buba.Larron Armor, LCSW [facility] Clinical Social Worker - Weekend Coverage cell #: [phone]

## 2015-06-13 NOTE — Clinical Social Work Note (Signed)
CSW attempted to call [facility] again to assess for bed that was promised yesterday.  Clayton [name], who does admissions is the nursing director and no one can reach her and no one has her personal cell phone number.  CSW called [facility] who cannot take LOG's  [facility] and they cannot take over weekend because their administrator would need to review and this does not occur over weekend.  CSW will continue to look for LOG SNF beds for pt  .Clayton Bubaegina Griffen Frayne, LCSW [facility] Clinical Social Worker - Weekend Coverage cell #: [phone]

## 2015-06-13 NOTE — Progress Notes (Signed)
Patient discharged to [facility].  Leaving with personal belongings.  Prosthetic leg on.  Leaving with chronic foley.  No complaints.  Room air.

## 2015-06-13 NOTE — Clinical Social Work Note (Signed)
[facility] to [facility] for [facility] bed.  Per Peter Kiewit Sonsammy [facility] does not have a contract with [employer] and cannot accept pt.  Elray Buba.Pravin Perezperez, LCSW [facility] Clinical Social Worker - Weekend Coverage cell #: [phone]

## 2015-06-13 NOTE — Care Management Note (Signed)
Case Management Note  Patient Details  Name: Clayton [name] MRN: [id number] Date of Birth: 1955/07/23  Subjective/Objective:    Hypotensive shock, CHF              Action/Plan: Scheduled dc to SNF, CSW following for SNF placement.   Expected Discharge Date:  06/13/2015               Expected Discharge Plan:     In-House Referral:  Social Worker Consult  Discharge planning Services  Case Management Consult   Status of Service:  Completed, signed off  Medicare Important Message Given:  Yes Date Medicare IM Given:    Medicare IM give by:    Date Additional Medicare IM Given:    Additional Medicare Important Message give by:     If discussed at Long Length of Stay Meetings, dates discussed:    Additional Comments:  Elliot CousinShavis, Rich Paprocki Ellen, RN 06/13/2015, 2:08 PM

## 2015-06-13 NOTE — Clinical Social Work Note (Signed)
CSW obtained permission from medical director to provided LOG to [facility] for pt SNF bed today.  CSW will follow up with pt and MD to provide information regarding rehab at [facility]. CSW had spoken to [facility] earlier in the day and they had beds today so if MD believes pt is ready [facility].     Clayton [name].Jessah Danser, LCSW [facility] Clinical Social Worker - Weekend Coverage cell #: [phone]

## 2015-06-13 NOTE — Clinical Social Work Placement (Signed)
   CLINICAL SOCIAL WORK PLACEMENT  NOTE  Date:  06/13/2015  Patient Details  Name: Clayton [name] MRN: [id number] Date of Birth: [birth year]  Clinical Social Work is seeking post-discharge placement for this patient at the   level of care (*CSW will initial, date and re-position this form in  chart as items are completed):      Patient/family provided with [facility] Clinical Social Work Department's list of facilities offering this level of care within the geographic area requested by the patient (or if unable, by the patient's family).      Patient/family informed of their freedom to choose among providers that offer the needed level of care, that participate in Medicare, Medicaid or managed care program needed by the patient, have an available bed and are willing to accept the patient.      Patient/family informed of [facility]'s ownership interest in [facility] and [facility], as well as of the fact that they are under no obligation to receive care at these facilities.  PASRR submitted to EDS on       PASRR number received on       Existing PASRR number confirmed on       FL2 transmitted to all facilities in geographic area requested by pt/family on       FL2 transmitted to all facilities within larger geographic area on       Patient informed that his/her managed care company has contracts with or will negotiate with certain facilities, including the following:            Patient/family informed of bed offers received.  [facility]    Physician recommends and patient chooses bed at      [facility]  Patient to be transferred to facility by   ambulance    Patient family notified on   June 13, 2015 of transfer.  Name of family member notified:    pt only he did not want anyone called.York Spaniel. Said he would call his sister and let her know    PHYSICIAN       Additional Comment:     _______________________________________________ Annetta MawKujawa,Azalee Weimer G, LCSW 06/13/2015, 2:35 PM

## 2015-06-13 NOTE — Clinical Social Work Note (Signed)
CSW called and left message for medical director to provide assistance with obtaining a SNF bed for pt today. CSW has not been able to find a facility that either [facility] or accepts an LOG or has any administration available over the weekend.  CSW will continue to look for placement.  Elray Buba.Antonya Leeder, LCSW [facility] Clinical Social Worker - Weekend Coverage cell #: [phone]

## 2015-06-14 LAB — CULTURE, BLOOD (ROUTINE X 2)
CULTURE: NO GROWTH
Culture: NO GROWTH

## 2015-06-26 ENCOUNTER — Encounter: Payer: Self-pay | Admitting: Internal Medicine

## 2015-06-26 ENCOUNTER — Ambulatory Visit (INDEPENDENT_AMBULATORY_CARE_PROVIDER_SITE_OTHER): Payer: Medicare HMO | Admitting: Internal Medicine

## 2015-06-26 VITALS — BP 113/70 | HR 65 | Ht 68.0 in

## 2015-06-26 DIAGNOSIS — E119 Type 2 diabetes mellitus without complications: Secondary | ICD-10-CM | POA: Insufficient documentation

## 2015-06-26 DIAGNOSIS — I5032 Chronic diastolic (congestive) heart failure: Secondary | ICD-10-CM

## 2015-06-26 DIAGNOSIS — I2583 Coronary atherosclerosis due to lipid rich plaque: Principal | ICD-10-CM

## 2015-06-26 DIAGNOSIS — I483 Typical atrial flutter: Secondary | ICD-10-CM | POA: Diagnosis not present

## 2015-06-26 DIAGNOSIS — I2510 Atherosclerotic heart disease of native coronary artery without angina pectoris: Secondary | ICD-10-CM | POA: Diagnosis not present

## 2015-06-26 NOTE — Assessment & Plan Note (Signed)
His ventricular rate is well controlled. We discussed the treatment options. I have recommended we proceed with EP study and catheter ablation of atrial flutter. The risks/benefits/goals/expectations of the procedure have been reviewed with the patient and he wishes to proceed.

## 2015-06-26 NOTE — Assessment & Plan Note (Signed)
He denies anginal symptoms. Will make no medication changes.

## 2015-06-26 NOTE — Progress Notes (Signed)
HPI Clayton [name] is referred today by Dr. Jacinto Halim for evaluation of atrial flutter. He initially presented with atrial flutter several weeks ago and returns today to discuss ablation. He has a h/o CAD, s/p MI, s/p CABG. He has peripheral vascular disease and is s/p right leg amputation. He initially presented with tachypalpitations but his ventricular rate has been controlled. He has been systemically anti-coagulated. He does not feel palpitations but is more sob with his heart out of rhythm.  Allergies  Allergen Reactions  . Niaspan [Niacin Er] Other (See Comments)    Face was red and hot     Current Outpatient Prescriptions  Medication Sig Dispense Refill  . acetaminophen (TYLENOL) 325 MG tablet Take 325 mg by mouth every 6 (six) hours as needed for moderate pain.    Marland Kitchen albuterol (PROVENTIL HFA;VENTOLIN HFA) 108 (90 BASE) MCG/ACT inhaler Inhale 1-2 puffs into the lungs every 6 (six) hours as needed for wheezing or shortness of breath.    Marland Kitchen apixaban (ELIQUIS) 5 MG TABS tablet Take 1 tablet (5 mg total) by mouth 2 (two) times daily. 60 tablet 0  . aspirin EC 81 MG tablet Take 1 tablet (81 mg total) by mouth daily. 30 tablet 0  . atorvastatin (LIPITOR) 40 MG tablet Take 40 mg by mouth daily.    . cholecalciferol (VITAMIN D) 1000 UNITS tablet Take 2,000 Units by mouth 2 (two) times daily.    . furosemide (LASIX) 40 MG tablet Take 1 tablet (40 mg total) by mouth daily. 30 tablet 0  . gabapentin (NEURONTIN) 600 MG tablet Take 600 mg by mouth 4 (four) times daily.     Marland Kitchen HYDROcodone-acetaminophen (NORCO/VICODIN) 5-325 MG tablet Take 1 tablet by mouth 3 (three) times daily as needed for moderate pain. 10 tablet 0  . insulin aspart (NOVOLOG) 100 UNIT/ML injection Inject 0-15 Units into the skin 3 (three) times daily with meals. fsg <120 no insulin  fsg 120-180- 2 units  fsg 180-220- 4 units fsg 220-260- 6 units  fsg 260-300 8 units fsg 300-350- 10 units fsg 350-400 -12 units  fsg >400 , call  MD 10 mL 11  . lisinopril (PRINIVIL,ZESTRIL) 2.5 MG tablet Take 2.5 mg by mouth daily.    . metoprolol (LOPRESSOR) 50 MG tablet Take 25 mg by mouth 2 (two) times daily.     No current facility-administered medications for this visit.     Past Medical History  Diagnosis Date  . Stroke (HCC)   . Coronary artery disease   . Diabetes mellitus without complication (HCC)   . Hypertension   . MI (myocardial infarction) (HCC)     x2  . Hyperlipidemia   . GERD (gastroesophageal reflux disease)   . Diabetic neuropathy (HCC)   . Diabetic retinopathy (HCC)   . PNA (pneumonia)   . CVA (cerebral infarction) 2007  . Diabetes (HCC)     ROS:   All systems reviewed and negative except as noted in the HPI.   Past Surgical History  Procedure Laterality Date  . Below knee leg amputation Left   . Appendectomy    . Percutaneous placement intravascular stent cervical carotid artery Right      Family History  Problem Relation Age of Onset  . Heart disease Father   . Diabetes Father   . Diabetes Sister      Social History   Social History  . Marital Status: Divorced    Spouse Name: N/A  . Number of Children:  N/A  . Years of Education: N/A   Occupational History  . Not on file.   Social History Main Topics  . Smoking status: Former Games developermoker  . Smokeless tobacco: Not on file  . Alcohol Use: No  . Drug Use: No  . Sexual Activity: Not on file   Other Topics Concern  . Not on file   Social History Narrative     BP 113/70 mmHg  Pulse 65  Ht 5\' 8"  (1.727 m)  Physical Exam:  Chronically ill appearing NAD HEENT: Unremarkable Neck:  6 cm JVD, no thyromegally Lymphatics:  No adenopathy Back:  No CVA tenderness Lungs:  Clear with no wheezes HEART:  IRegular rate rhythm, no murmurs, no rubs, no clicks Abd:  soft, positive bowel sounds, no organomegally, no rebound, no guarding Ext:  2 plus pulses, no edema, no cyanosis, no clubbing Skin:  No rashes no nodules Neuro:  CN  II through XII intact, motor grossly intact  EKG - atrial flutter with a CVR  Assess/Plan:

## 2015-06-26 NOTE — Assessment & Plan Note (Signed)
His symptoms are well controlled. He will maintain a low sodium diet.

## 2015-06-26 NOTE — Patient Instructions (Addendum)
Medication Instructions:  Your physician recommends that you continue on your current medications as directed. Please refer to the Current Medication list given to you today.   Labwork: None ordered   Testing/Procedures: Your physician has recommended that you have an ablation. Catheter ablation is a medical procedure used to treat some cardiac arrhythmias (irregular heartbeats). During catheter ablation, a long, thin, flexible tube is put into a blood vessel in your groin (upper thigh), or neck. This tube is called an ablation catheter. It is then guided to your heart through the blood vessel. Radio frequency waves destroy small areas of heart tissue where abnormal heartbeats may cause an arrhythmia to start. Please see the instruction sheet given to you today.  Ablation is scheduled for 08/02/14 at 11am.  Please check in at the [facility] Entrance at 9am  Do not eat or drink after midnight the night prior to procedure and do not take morning medications the day of procedure.    Follow-Up: Your physician recommends that you schedule a follow-up appointment in: 4 weeks from 08/03/15 with Francis Dowse, PA     Any Other Special Instructions Will Be Listed Below (If Applicable).  Cardiac Ablation Cardiac ablation is a procedure to disable a small amount of heart tissue in very specific places. The heart has many electrical connections. Sometimes these connections are abnormal and can cause the heart to beat very fast or irregularly. By disabling some of the problem areas, heart rhythm can be improved or made normal. Ablation is done for people who:   Have Wolff-Parkinson-White syndrome.   Have other fast heart rhythms (tachycardia).   Have taken medicines for an abnormal heart rhythm (arrhythmia) that resulted in:   No success.   Side effects.   May have a high-risk heartbeat that could result in death.  LET [facility] CARE PROVIDER KNOW ABOUT:   Any allergies you have or  any previous reactions you have had to X-ray dye, food (such as seafood), medicine, or tape.   All medicines you are taking, including vitamins, herbs, eye drops, creams, and over-the-counter medicines.   Previous problems you or members of your family have had with the use of anesthetics.   Any blood disorders you have.   Previous surgeries or procedures (such as a kidney transplant) you have had.   Medical conditions you have (such as kidney failure).  RISKS AND COMPLICATIONS Generally, cardiac ablation is a safe procedure. However, problems can occur and include:   Increased risk of cancer. Depending on how long it takes to do the ablation, the dose of radiation can be high.  Bruising and bleeding where a thin, flexible tube (catheter) was inserted during the procedure.   Bleeding into the chest, especially into the sac that surrounds the heart (serious).  Need for a permanent pacemaker if the normal electrical system is damaged.   The procedure may not be fully effective, and this may not be recognized for months. Repeat ablation procedures are sometimes required. BEFORE THE PROCEDURE   Follow any instructions from your health care provider regarding eating and drinking before the procedure.   Take your medicines as directed at regular times with water, unless instructed otherwise by your health care provider. If you are taking diabetes medicine, including insulin, ask how you are to take it and if there are any special instructions you should follow. It is common to adjust insulin dosing the day of the ablation.  PROCEDURE  An ablation is usually performed in  a catheterization laboratory with the guidance of fluoroscopy. Fluoroscopy is a type of X-ray that helps your health care provider see images of your heart during the procedure.   An ablation is a minimally invasive procedure. This means a small cut (incision) is made in either your neck or groin. Your health  care provider will decide where to make the incision based on your medical history and physical exam.  An IV tube will be started before the procedure begins. You will be given an anesthetic or medicine to help you relax (sedative).  The skin on your neck or groin will be numbed. A needle will be inserted into a large vein in your neck or groin and catheters will be threaded to your heart.  A special dye that shows up on fluoroscopy pictures may be injected through the catheter. The dye helps your health care provider see the area of the heart that needs treatment.  The catheter has electrodes on the tip. When the area of heart tissue that is causing the arrhythmia is found, the catheter tip will send an electrical current to the area and "scar" the tissue. Three types of energy can be used to ablate the heart tissue:   Heat (radiofrequency energy).   Laser energy.   Extreme cold (cryoablation).   When the area of the heart has been ablated, the catheter will be taken out. Pressure will be held on the insertion site. This will help the insertion site clot and keep it from bleeding. A bandage will be placed on the insertion site.  AFTER THE PROCEDURE   After the procedure, you will be taken to a recovery area where your vital signs (blood pressure, heart rate, and breathing) will be monitored. The insertion site will also be monitored for bleeding.   You will need to lie still for 4-6 hours. This is to ensure you do not bleed from the catheter insertion site.    This information is not intended to replace advice given to you by your health care provider. Make sure you discuss any questions you have with your health care provider.   Document Released: 11/13/2008 Document Revised: 07/18/2014 Document Reviewed: 11/19/2012 Elsevier Interactive Patient Education [employer]2016 Elsevier Inc.      If you need a refill on your cardiac medications before your next appointment, please call your  pharmacy.

## 2015-07-23 ENCOUNTER — Telehealth (HOSPITAL_COMMUNITY): Payer: Self-pay | Admitting: Interventional Radiology

## 2015-07-23 NOTE — Telephone Encounter (Signed)
Tried to reach patient again concerning his treatment for stenosis with Dr. Corliss Skainseveshwar. Called his cell phone and left a voice mail for him to call me back to see if he wants to schedule his treatment. JM

## 2015-07-30 ENCOUNTER — Telehealth: Payer: Self-pay | Admitting: Internal Medicine

## 2015-07-30 NOTE — Telephone Encounter (Signed)
New Message  Please call back to discuss the ablation. Date time and etc.

## 2015-07-30 NOTE — Telephone Encounter (Signed)
Spoke with [facility] and gave her all his instructions for procedure 08/03/15 as he had lost his

## 2015-08-03 ENCOUNTER — Encounter (HOSPITAL_COMMUNITY)
Admission: RE | Disposition: A | Discharge status: Home Health | Payer: Self-pay | Source: Ambulatory Visit | Attending: Internal Medicine

## 2015-08-03 ENCOUNTER — Ambulatory Visit (HOSPITAL_COMMUNITY)
Admission: RE | Admit: 2015-08-03 | Discharge: 2015-08-04 | Disposition: A | Discharge status: Home Health | Payer: Medicare HMO | Source: Ambulatory Visit | Attending: Internal Medicine | Admitting: Internal Medicine

## 2015-08-03 ENCOUNTER — Encounter (HOSPITAL_COMMUNITY): Payer: Self-pay | Admitting: *Deleted

## 2015-08-03 DIAGNOSIS — Z87891 Personal history of nicotine dependence: Secondary | ICD-10-CM | POA: Insufficient documentation

## 2015-08-03 DIAGNOSIS — E785 Hyperlipidemia, unspecified: Secondary | ICD-10-CM | POA: Insufficient documentation

## 2015-08-03 DIAGNOSIS — E1142 Type 2 diabetes mellitus with diabetic polyneuropathy: Secondary | ICD-10-CM | POA: Diagnosis not present

## 2015-08-03 DIAGNOSIS — Z951 Presence of aortocoronary bypass graft: Secondary | ICD-10-CM | POA: Diagnosis not present

## 2015-08-03 DIAGNOSIS — Z8673 Personal history of transient ischemic attack (TIA), and cerebral infarction without residual deficits: Secondary | ICD-10-CM | POA: Diagnosis not present

## 2015-08-03 DIAGNOSIS — I10 Essential (primary) hypertension: Secondary | ICD-10-CM | POA: Insufficient documentation

## 2015-08-03 DIAGNOSIS — E11319 Type 2 diabetes mellitus with unspecified diabetic retinopathy without macular edema: Secondary | ICD-10-CM | POA: Insufficient documentation

## 2015-08-03 DIAGNOSIS — I483 Typical atrial flutter: Secondary | ICD-10-CM | POA: Diagnosis not present

## 2015-08-03 DIAGNOSIS — Z7901 Long term (current) use of anticoagulants: Secondary | ICD-10-CM | POA: Diagnosis not present

## 2015-08-03 DIAGNOSIS — Z794 Long term (current) use of insulin: Secondary | ICD-10-CM | POA: Diagnosis not present

## 2015-08-03 DIAGNOSIS — I5032 Chronic diastolic (congestive) heart failure: Secondary | ICD-10-CM | POA: Diagnosis not present

## 2015-08-03 DIAGNOSIS — I4892 Unspecified atrial flutter: Secondary | ICD-10-CM | POA: Diagnosis present

## 2015-08-03 DIAGNOSIS — I252 Old myocardial infarction: Secondary | ICD-10-CM | POA: Insufficient documentation

## 2015-08-03 DIAGNOSIS — Z7982 Long term (current) use of aspirin: Secondary | ICD-10-CM | POA: Insufficient documentation

## 2015-08-03 DIAGNOSIS — I2510 Atherosclerotic heart disease of native coronary artery without angina pectoris: Secondary | ICD-10-CM | POA: Diagnosis not present

## 2015-08-03 DIAGNOSIS — Z89519 Acquired absence of unspecified leg below knee: Secondary | ICD-10-CM

## 2015-08-03 DIAGNOSIS — Z89511 Acquired absence of right leg below knee: Secondary | ICD-10-CM | POA: Insufficient documentation

## 2015-08-03 HISTORY — PX: ELECTROPHYSIOLOGIC STUDY: SHX172A

## 2015-08-03 LAB — CBC
HEMATOCRIT: 37.7 % — AB (ref 39.0–52.0)
HEMOGLOBIN: 11.9 g/dL — AB (ref 13.0–17.0)
MCH: 26.9 pg (ref 26.0–34.0)
MCHC: 31.6 g/dL (ref 30.0–36.0)
MCV: 85.3 fL (ref 78.0–100.0)
Platelets: 168 10*3/uL (ref 150–400)
RBC: 4.42 MIL/uL (ref 4.22–5.81)
RDW: 16.5 % — ABNORMAL HIGH (ref 11.5–15.5)
WBC: 9 10*3/uL (ref 4.0–10.5)

## 2015-08-03 LAB — BASIC METABOLIC PANEL
Anion gap: 9 (ref 5–15)
BUN: 15 mg/dL (ref 6–20)
CHLORIDE: 105 mmol/L (ref 101–111)
CO2: 28 mmol/L (ref 22–32)
CREATININE: 0.91 mg/dL (ref 0.61–1.24)
Calcium: 8.8 mg/dL — ABNORMAL LOW (ref 8.9–10.3)
GFR calc Af Amer: >60 mL/min (ref >60)
GFR calc non Af Amer: >60 mL/min (ref >60)
GLUCOSE: 101 mg/dL — AB (ref 65–99)
Potassium: 3.8 mmol/L (ref 3.5–5.1)
Sodium: 142 mmol/L (ref 135–145)

## 2015-08-03 LAB — GLUCOSE, CAPILLARY
GLUCOSE-CAPILLARY: 63 mg/dL — AB (ref 65–99)
GLUCOSE-CAPILLARY: 94 mg/dL (ref 65–99)
GLUCOSE-CAPILLARY: 102 mg/dL — AB (ref 65–99)
Glucose-Capillary: 88 mg/dL (ref 65–99)

## 2015-08-03 SURGERY — A-FLUTTER/A-TACH/SVT ABLATION
Anesthesia: LOCAL

## 2015-08-03 MED ORDER — PANTOPRAZOLE SODIUM 40 MG PO TBEC
40.0000 mg | DELAYED_RELEASE_TABLET | Freq: Every day | ORAL | Status: DC
  Administered 2015-08-03 – 2015-08-04 (×2): 40 mg via ORAL
  Filled 2015-08-03 (×3): qty 1

## 2015-08-03 MED ORDER — FUROSEMIDE 40 MG PO TABS
40.0000 mg | ORAL_TABLET | Freq: Every day | ORAL | Status: DC
  Administered 2015-08-03 – 2015-08-04 (×2): 40 mg via ORAL
  Filled 2015-08-03 (×2): qty 1

## 2015-08-03 MED ORDER — APIXABAN 5 MG PO TABS
5.0000 mg | ORAL_TABLET | Freq: Two times a day (BID) | ORAL | Status: DC
  Administered 2015-08-03 – 2015-08-04 (×2): 5 mg via ORAL
  Filled 2015-08-03 (×2): qty 1

## 2015-08-03 MED ORDER — INSULIN ASPART 100 UNIT/ML ~~LOC~~ SOLN: 0.0000 [IU] | Freq: Three times a day (TID) | SUBCUTANEOUS | Status: DC

## 2015-08-03 MED ORDER — OXYBUTYNIN CHLORIDE ER 10 MG PO TB24
10.0000 mg | ORAL_TABLET | ORAL | Status: DC
  Administered 2015-08-04: 07:00:00 10 mg via ORAL
  Filled 2015-08-03 (×3): qty 1

## 2015-08-03 MED ORDER — MIDAZOLAM HCL 5 MG/5ML IJ SOLN
INTRAMUSCULAR | Status: AC
  Filled 2015-08-03: qty 5

## 2015-08-03 MED ORDER — LISINOPRIL 5 MG PO TABS
2.5000 mg | ORAL_TABLET | Freq: Every day | ORAL | Status: DC
  Administered 2015-08-03 – 2015-08-04 (×2): 2.5 mg via ORAL
  Filled 2015-08-03 (×2): qty 1

## 2015-08-03 MED ORDER — ONDANSETRON HCL 4 MG/2ML IJ SOLN: 4.0000 mg | Freq: Four times a day (QID) | INTRAMUSCULAR | Status: DC | PRN

## 2015-08-03 MED ORDER — MIDAZOLAM HCL 5 MG/5ML IJ SOLN
INTRAMUSCULAR | Status: DC | PRN
  Administered 2015-08-03 (×6): 1 mg via INTRAVENOUS

## 2015-08-03 MED ORDER — MAGNESIUM OXIDE 420 MG PO TABS: 1.0000 | ORAL_TABLET | Freq: Every day | ORAL | Status: DC

## 2015-08-03 MED ORDER — HEPARIN (PORCINE) IN NACL 2-0.9 UNIT/ML-% IJ SOLN
INTRAMUSCULAR | Status: DC | PRN
  Administered 2015-08-03: 16:00:00

## 2015-08-03 MED ORDER — FENTANYL CITRATE (PF) 100 MCG/2ML IJ SOLN
INTRAMUSCULAR | Status: AC
  Filled 2015-08-03: qty 2

## 2015-08-03 MED ORDER — DEXTROSE 50 % IV SOLN
INTRAVENOUS | Status: AC
  Filled 2015-08-03: qty 50

## 2015-08-03 MED ORDER — DEXTROSE 50 % IV SOLN
1.0000 | Freq: Once | INTRAVENOUS | Status: AC
  Administered 2015-08-03: 25 mL via INTRAVENOUS

## 2015-08-03 MED ORDER — ATORVASTATIN CALCIUM 40 MG PO TABS
40.0000 mg | ORAL_TABLET | Freq: Every day | ORAL | Status: DC
  Administered 2015-08-03 – 2015-08-04 (×2): 40 mg via ORAL
  Filled 2015-08-03 (×2): qty 1

## 2015-08-03 MED ORDER — ALBUTEROL SULFATE (2.5 MG/3ML) 0.083% IN NEBU: 2.5000 mg | INHALATION_SOLUTION | Freq: Four times a day (QID) | RESPIRATORY_TRACT | Status: DC | PRN

## 2015-08-03 MED ORDER — METOPROLOL TARTRATE 25 MG PO TABS
25.0000 mg | ORAL_TABLET | Freq: Two times a day (BID) | ORAL | Status: DC
  Administered 2015-08-03 – 2015-08-04 (×2): 25 mg via ORAL
  Filled 2015-08-03 (×2): qty 1

## 2015-08-03 MED ORDER — BUPIVACAINE HCL (PF) 0.25 % IJ SOLN
INTRAMUSCULAR | Status: AC
  Filled 2015-08-03: qty 60

## 2015-08-03 MED ORDER — SODIUM CHLORIDE 0.9 % IJ SOLN: 3.0000 mL | INTRAMUSCULAR | Status: DC | PRN

## 2015-08-03 MED ORDER — BUPIVACAINE HCL (PF) 0.25 % IJ SOLN
INTRAMUSCULAR | Status: DC | PRN
  Administered 2015-08-03: 50 mL

## 2015-08-03 MED ORDER — ACETAMINOPHEN 325 MG PO TABS: 650.0000 mg | ORAL_TABLET | ORAL | Status: DC | PRN

## 2015-08-03 MED ORDER — MAGNESIUM OXIDE 400 (241.3 MG) MG PO TABS
400.0000 mg | ORAL_TABLET | Freq: Every day | ORAL | Status: DC
  Administered 2015-08-03 – 2015-08-04 (×2): 400 mg via ORAL
  Filled 2015-08-03 (×2): qty 1

## 2015-08-03 MED ORDER — ALBUTEROL SULFATE HFA 108 (90 BASE) MCG/ACT IN AERS: 1.0000 | INHALATION_SPRAY | Freq: Four times a day (QID) | RESPIRATORY_TRACT | Status: DC | PRN

## 2015-08-03 MED ORDER — FENTANYL CITRATE (PF) 100 MCG/2ML IJ SOLN
INTRAMUSCULAR | Status: DC | PRN
  Administered 2015-08-03: 25 ug via INTRAVENOUS
  Administered 2015-08-03 (×2): 12.5 ug via INTRAVENOUS
  Administered 2015-08-03 (×3): 25 ug via INTRAVENOUS
  Administered 2015-08-03 (×2): 12.5 ug via INTRAVENOUS

## 2015-08-03 MED ORDER — ASPIRIN EC 81 MG PO TBEC
81.0000 mg | DELAYED_RELEASE_TABLET | Freq: Every day | ORAL | Status: DC
  Administered 2015-08-03 – 2015-08-04 (×2): 81 mg via ORAL
  Filled 2015-08-03 (×2): qty 1

## 2015-08-03 MED ORDER — GABAPENTIN 600 MG PO TABS
600.0000 mg | ORAL_TABLET | Freq: Four times a day (QID) | ORAL | Status: DC
  Administered 2015-08-03 – 2015-08-04 (×2): 600 mg via ORAL
  Filled 2015-08-03 (×2): qty 1

## 2015-08-03 MED ORDER — ACETAMINOPHEN 325 MG PO TABS
325.0000 mg | ORAL_TABLET | Freq: Four times a day (QID) | ORAL | Status: DC | PRN
  Administered 2015-08-03: 325 mg via ORAL
  Filled 2015-08-03: qty 1

## 2015-08-03 MED ORDER — VITAMIN D 1000 UNITS PO TABS
2000.0000 [IU] | ORAL_TABLET | Freq: Two times a day (BID) | ORAL | Status: DC
  Administered 2015-08-03 – 2015-08-04 (×2): 2000 [IU] via ORAL
  Filled 2015-08-03 (×2): qty 2

## 2015-08-03 MED ORDER — SODIUM CHLORIDE 0.9 % IV SOLN: 250.0000 mL | INTRAVENOUS | Status: DC | PRN

## 2015-08-03 MED ORDER — HEPARIN (PORCINE) IN NACL 2-0.9 UNIT/ML-% IJ SOLN
INTRAMUSCULAR | Status: AC
  Filled 2015-08-03: qty 500

## 2015-08-03 MED ORDER — SODIUM CHLORIDE 0.9 % IJ SOLN
3.0000 mL | Freq: Two times a day (BID) | INTRAMUSCULAR | Status: DC
  Administered 2015-08-03: 3 mL via INTRAVENOUS

## 2015-08-03 MED ORDER — SODIUM CHLORIDE 0.9 % IV SOLN
INTRAVENOUS | Status: DC
  Administered 2015-08-03: 10:00:00 via INTRAVENOUS

## 2015-08-03 SURGICAL SUPPLY — 12 items
BAG SNAP BAND KOVER 36X36 (MISCELLANEOUS) ×1 IMPLANT
CATH BLAZERPRIME XP (ABLATOR) ×2 IMPLANT
CATH DUODECA HALO/ISMUS 7FR (CATHETERS) ×1 IMPLANT
CATH JOSEPHSON QUAD-ALLRED 6FR (CATHETERS) ×1 IMPLANT
CATH POLARIS X 2.5/5/2.5 DECAP (CATHETERS) ×2 IMPLANT
PACK EP LATEX FREE (CUSTOM PROCEDURE TRAY) ×2
PACK EP LF (CUSTOM PROCEDURE TRAY) IMPLANT
PAD DEFIB LIFELINK (PAD) ×1 IMPLANT
SHEATH PINNACLE 6F 10CM (SHEATH) ×2 IMPLANT
SHEATH PINNACLE 7F 10CM (SHEATH) ×1 IMPLANT
SHEATH PINNACLE 8F 10CM (SHEATH) ×1 IMPLANT
SHIELD RADPAD SCOOP 12X17 (MISCELLANEOUS) ×1 IMPLANT

## 2015-08-03 NOTE — H&P (Signed)
HPI Clayton [name] is referred today by Dr. Jacinto Halim for evaluation of atrial flutter. He initially presented with atrial flutter several weeks ago and returns today to discuss ablation. He has a h/o CAD, s/p MI, s/p CABG. He has peripheral vascular disease and is s/p right leg amputation. He initially presented with tachypalpitations but his ventricular rate has been controlled. He has been systemically anti-coagulated. He does not feel palpitations but is more sob with his heart out of rhythm.  Allergies  Allergen Reactions  . Niaspan [Niacin Er] Other (See Comments)    Face was red and hot     Current Outpatient Prescriptions  Medication Sig Dispense Refill  . acetaminophen (TYLENOL) 325 MG tablet Take 325 mg by mouth every 6 (six) hours as needed for moderate pain.    Marland Kitchen albuterol (PROVENTIL HFA;VENTOLIN HFA) 108 (90 BASE) MCG/ACT inhaler Inhale 1-2 puffs into the lungs every 6 (six) hours as needed for wheezing or shortness of breath.    Marland Kitchen apixaban (ELIQUIS) 5 MG TABS tablet Take 1 tablet (5 mg total) by mouth 2 (two) times daily. 60 tablet 0  . aspirin EC 81 MG tablet Take 1 tablet (81 mg total) by mouth daily. 30 tablet 0  . atorvastatin (LIPITOR) 40 MG tablet Take 40 mg by mouth daily.    . cholecalciferol (VITAMIN D) 1000 UNITS tablet Take 2,000 Units by mouth 2 (two) times daily.    . furosemide (LASIX) 40 MG tablet Take 1 tablet (40 mg total) by mouth daily. 30 tablet 0  . gabapentin (NEURONTIN) 600 MG tablet Take 600 mg by mouth 4 (four) times daily.     Marland Kitchen HYDROcodone-acetaminophen (NORCO/VICODIN) 5-325 MG tablet Take 1 tablet by mouth 3 (three) times daily as needed for moderate pain. 10 tablet 0  . insulin aspart (NOVOLOG) 100 UNIT/ML injection Inject 0-15 Units into the skin 3 (three) times daily with meals. fsg <120 no insulin fsg 120-180- 2 units fsg 180-220- 4 units fsg 220-260- 6 units fsg 260-300 8 units fsg  300-350- 10 units fsg 350-400 -12 units fsg >400 , call MD 10 mL 11  . lisinopril (PRINIVIL,ZESTRIL) 2.5 MG tablet Take 2.5 mg by mouth daily.    . metoprolol (LOPRESSOR) 50 MG tablet Take 25 mg by mouth 2 (two) times daily.     No current facility-administered medications for this visit.     Past Medical History  Diagnosis Date  . Stroke (HCC)   . Coronary artery disease   . Diabetes mellitus without complication (HCC)   . Hypertension   . MI (myocardial infarction) (HCC)     x2  . Hyperlipidemia   . GERD (gastroesophageal reflux disease)   . Diabetic neuropathy (HCC)   . Diabetic retinopathy (HCC)   . PNA (pneumonia)   . CVA (cerebral infarction) 2007  . Diabetes (HCC)     ROS:  All systems reviewed and negative except as noted in the HPI.   Past Surgical History  Procedure Laterality Date  . Below knee leg amputation Left   . Appendectomy    . Percutaneous placement intravascular stent cervical carotid artery Right      Family History  Problem Relation Age of Onset  . Heart disease Father   . Diabetes Father   . Diabetes Sister      Social History   Social History  . Marital Status: Divorced    Spouse Name: N/A  . Number of Children: N/A  . Years of Education: N/A  Occupational History  . Not on file.   Social History Main Topics  . Smoking status: Former Games developer  . Smokeless tobacco: Not on file  . Alcohol Use: No  . Drug Use: No  . Sexual Activity: Not on file   Other Topics Concern  . Not on file   Social History Narrative     BP 113/70 mmHg  Pulse 65  Ht  (1.727 m)  Physical Exam:  Chronically ill appearing NAD HEENT: Unremarkable Neck: 6 cm JVD, no thyromegally Lymphatics: No adenopathy Back: No CVA tenderness Lungs: Clear with no wheezes HEART: IRegular rate rhythm,  no murmurs, no rubs, no clicks Abd: soft, positive bowel sounds, no organomegally, no rebound, no guarding Ext: 2 plus pulses, no edema, no cyanosis, no clubbing Skin: No rashes no nodules Neuro: CN II through XII intact, motor grossly intact  EKG - atrial flutter with a CVR  Assess/Plan:            Atrial flutter (HCC) - Marinus Maw, MD at 06/26/2015 3:06 PM     Status: Written Related Problem: Atrial flutter (HCC)   Expand All Collapse All   His ventricular rate is well controlled. We discussed the treatment options. I have recommended we proceed with EP study and catheter ablation of atrial flutter. The risks/benefits/goals/expectations of the procedure have been reviewed with the patient and he wishes to proceed.            Chronic diastolic congestive heart failure (HCC) - Marinus Maw, MD at 06/26/2015 3:07 PM     Status: Written Related Problem: Chronic diastolic congestive heart failure (HCC)   Expand All Collapse All   His symptoms are well controlled. He will maintain a low sodium diet.            CAD (coronary artery disease) - Marinus Maw, MD at 06/26/2015 3:08 PM     Status: Written Related Problem: CAD (coronary artery disease)   Expand All Collapse All   He denies anginal symptoms. Will make no medication changes       EP Attending  Patient seen and examined. Agree with above. I have reviewed the findings and since his last clinic, visit, no changes. Will plan to proceed with EP Study and catheter ablation of atrial flutter. He wishes to proceed.   Clayton [name], M.D.

## 2015-08-03 NOTE — Progress Notes (Addendum)
    Site area: RFV x 3 Site Prior to Removal:  Level 0 Pressure Applied For:20 min Manual:   yes Patient Status During Pull: stable  Post Pull Site:  Level 0 Post Pull Instructions Given:  yes Post Pull Pulses Present: palpable Dressing Applied:  clear Bedrest begins @ 1820 till 2420 Comments:

## 2015-08-04 ENCOUNTER — Encounter (HOSPITAL_COMMUNITY): Payer: Self-pay | Admitting: Internal Medicine

## 2015-08-04 DIAGNOSIS — I483 Typical atrial flutter: Secondary | ICD-10-CM | POA: Diagnosis not present

## 2015-08-04 DIAGNOSIS — I2510 Atherosclerotic heart disease of native coronary artery without angina pectoris: Secondary | ICD-10-CM | POA: Diagnosis not present

## 2015-08-04 DIAGNOSIS — I252 Old myocardial infarction: Secondary | ICD-10-CM | POA: Diagnosis not present

## 2015-08-04 DIAGNOSIS — Z951 Presence of aortocoronary bypass graft: Secondary | ICD-10-CM | POA: Diagnosis not present

## 2015-08-04 DIAGNOSIS — Z89519 Acquired absence of unspecified leg below knee: Secondary | ICD-10-CM

## 2015-08-04 LAB — GLUCOSE, CAPILLARY: Glucose-Capillary: 116 mg/dL — ABNORMAL HIGH (ref 65–99)

## 2015-08-04 MED ORDER — OFF THE BEAT BOOK
Freq: Once | Status: AC
  Administered 2015-08-04: 05:00:00
  Filled 2015-08-04: qty 1

## 2015-08-04 NOTE — Discharge Summary (Signed)
ELECTROPHYSIOLOGY PROCEDURE DISCHARGE SUMMARY    Patient ID: Clayton [name],  MRN: [id number], DOB/AGE: 62-Oct-1957 62 y.o.  Admit date: 08/03/2015 Discharge date: 08/04/2015  Primary Care Physician: Carolynne Edouard, PA-C Primary Cardiologist: Jacinto Halim Electrophysiologist: Ladona Ridgel  Primary Discharge Diagnosis:  Principal Problem:   Atrial flutter [facility]) Active Problems:   H/O: stroke   CAD (coronary artery disease)   DM type 2 with diabetic peripheral neuropathy (HCC)   Chronic diastolic congestive heart failure (HCC)   Hx of BKA   Allergies  Allergen Reactions  . Niaspan [Niacin Er] Other (See Comments)    Face was red and hot    Procedures This Admission:  1.  Electrophysiology study and radiofrequency catheter ablation on 08/03/15 by [name]. This study demonstrated isthmus dependent right atrial flutter upon presentation that was successfully ablated along the cavo-tricupsid isthmus with bidirectional isthmus block achieved. There were no inducible arrhythmias following procedure and no early apparent complications.   Brief HPI:  Clayton [name] is a 60 y.o. male with a past medical history as outlined above. He was found to have typical atrial flutter with controlled ventricular response and referred to [name] in the outpatient setting for treatment options. Risks, benefits, alternatives to catheter ablation were reviewed with the patient who wished to proceed.   Hospital Course:  The patient was admitted and underwent typical flutter ablation with details as outlined above. He was monitored on telemetry overnight which demonstrated sinus rhythm with occasional PVC's.  Groin was without hematoma/bruit.  He was examined by [name] and considered stable for discharge to home. Plan will be to continue [facility] for 4 weeks and then discontinue at follow up appointment.    Physical Exam: Filed Vitals:   08/03/15 2330 08/03/15 2350 08/04/15 0034 08/04/15 0515  BP: 135/91 121/76   122/82  Pulse:    95  Temp:    98.2 F (36.8 C)  TempSrc:    Axillary  Resp: Height:      Weight:    231 lb 7.7 oz (105 kg)  SpO2:    92%    GEN- The patient is obese and chronically ill appearing, alert and oriented x 3 today.   HEENT: normocephalic, atraumatic; sclera clear, conjunctiva pink; hearing intact; oropharynx clear; neck supple  Lungs- Clear to ausculation bilaterally, normal work of breathing.  No wheezes, rales, rhonchi Heart- Regular rate and rhythm, no murmurs, rubs or gallops  GI- soft, non-tender, non-distended, bowel sounds present  Extremities- s/p right BKA, no clubbing, cyanosis, or edema, groin without hematoma/bruit MS- no significant deformity or atrophy Skin- warm and dry, no rash or lesion Psych- euthymic mood, full affect Neuro- strength and sensation are intact    Labs:   Lab Results  Component Value Date   WBC 9.0 08/03/2015   HGB 11.9* 08/03/2015   HCT 37.7* 08/03/2015   MCV 85.3 08/03/2015   PLT 168 08/03/2015     Recent Labs Lab 08/03/15 0945  NA 142  K 3.8  CL 105  CO2 28  BUN 15  CREATININE 0.91  CALCIUM 8.8*  GLUCOSE 101*     Discharge Medications:  Current Discharge Medication List    CONTINUE these medications which have NOT CHANGED   Details  acetaminophen (TYLENOL) 325 MG tablet Take 325 mg by mouth every 6 (six) hours as needed for moderate pain.    albuterol (PROVENTIL HFA;VENTOLIN HFA) 108 (90 BASE) MCG/ACT inhaler Inhale 1-2 puffs into  the lungs every 6 (six) hours as needed for wheezing or shortness of breath.    apixaban (ELIQUIS) 5 MG TABS tablet Take 1 tablet (5 mg total) by mouth 2 (two) times daily. Qty: 60 tablet, Refills: 0    aspirin EC 81 MG tablet Take 1 tablet (81 mg total) by mouth daily. Qty: 30 tablet, Refills: 0    atorvastatin (LIPITOR) 40 MG tablet Take 40 mg by mouth daily.    cholecalciferol (VITAMIN D) 1000 UNITS tablet Take 2,000 Units by mouth 2 (two) times daily.      furosemide (LASIX) 40 MG tablet Take 1 tablet (40 mg total) by mouth daily. Qty: 30 tablet, Refills: 0    gabapentin (NEURONTIN) 600 MG tablet Take 600 mg by mouth 4 (four) times daily.     insulin aspart (NOVOLOG) 100 UNIT/ML injection Inject 0-15 Units into the skin 3 (three) times daily with meals. fsg <120 no insulin  fsg 120-180- 2 units  fsg 180-220- 4 units fsg 220-260- 6 units  fsg 260-300 8 units fsg 300-350- 10 units fsg 350-400 -12 units  fsg >400 , call MD Qty: 10 mL, Refills: 11    lisinopril (PRINIVIL,ZESTRIL) 2.5 MG tablet Take 2.5 mg by mouth daily.    Magnesium Oxide 420 MG TABS Take 1 tablet by mouth daily.    metoprolol (LOPRESSOR) 50 MG tablet Take 25 mg by mouth 2 (two) times daily.    omeprazole (PRILOSEC) 20 MG capsule Take 20 mg by mouth daily.    oxybutynin (DITROPAN-XL) 10 MG 24 hr tablet Take 10 mg by mouth every morning.    oxyCODONE (ROXICODONE) 15 MG immediate release tablet Take 15 mg by mouth every 8 (eight) hours as needed for pain.        Disposition: Pt is being discharged home today in good condition. Discharge Instructions    Diet - low sodium heart healthy    Complete by:  As directed      Discharge instructions    Complete by:  As directed   No lifting over 5 lbs for 1 week. No sexual activity for 1 week. Keep procedure site clean & dry. If you notice increased pain, swelling, bleeding or pus, call/return!  You may shower, but no soaking baths/hot tubs/pools for 1 week.     Increase activity slowly    Complete by:  As directed           Follow-up Information    Follow up with Lewayne Bunting, MD On 09/01/2015.   Specialty:  Cardiology   Why:  at 9:15AM   Contact information:   [address] [place] [address] [phone]       Duration of Discharge Encounter: Greater than 30 minutes including physician time.  Signed, Gypsy Balsam, NP 08/04/2015 6:37 AM  EP Attending  Patient seen and examined.  Agree with the findings above. He is in NSR and stable. Ok for discharge home. Usual followup. Stop anti-coagulation in 4 weeks as noted above.   Leonia Reeves.D.

## 2015-08-07 ENCOUNTER — Telehealth: Payer: Self-pay | Admitting: Internal Medicine

## 2015-08-07 NOTE — Telephone Encounter (Signed)
Returned call to [facility]. Left a message that bruising is normal and he will have some swelling.  Per the d/c summary there was no bruit or hematoma.  If she feels he needs to be seen can bring in next week to see a PA or NP.  I have asked she call me back to let me know.

## 2015-08-07 NOTE — Telephone Encounter (Signed)
April is calling because Clayton [name] has huge bruise over the groin area where he had the ablation on Monday and have some swelling .

## 2015-08-18 ENCOUNTER — Telehealth (HOSPITAL_COMMUNITY): Payer: Self-pay | Admitting: Interventional Radiology

## 2015-08-18 NOTE — Telephone Encounter (Signed)
Called pt, left VM for him to call me back concerning his treatment of his stenosis. JM

## 2015-08-28 LAB — NM MYOCAR MULTI W/SPECT W/WALL MOTION / EF
CHL CUP STRESS STAGE 1 DBP: 57 mmHg
CHL CUP STRESS STAGE 1 GRADE: 0 %
CHL CUP STRESS STAGE 1 SBP: 148 mmHg
CHL CUP STRESS STAGE 1 SPEED: 0 mph
CHL CUP STRESS STAGE 3 GRADE: 0 %
CHL CUP STRESS STAGE 3 HR: 157 {beats}/min
CHL CUP STRESS STAGE 3 SPEED: 0 mph
CHL CUP STRESS STAGE 4 GRADE: 0 %
CHL CUP STRESS STAGE 4 SPEED: 0 mph
CHL CUP STRESS STAGE 5 GRADE: 0 %
CHL CUP STRESS STAGE 6 HR: 160 {beats}/min
CHL CUP STRESS STAGE 6 SPEED: 0 mph
CSEPEW: 1 METS
Peak HR: 157 {beats}/min
Percent of predicted max HR: 98 %
Stage 1 HR: 141 {beats}/min
Stage 2 Grade: 0 %
Stage 2 HR: 136 {beats}/min
Stage 2 Speed: 0 mph
Stage 3 DBP: 99 mmHg
Stage 3 SBP: 138 mmHg
Stage 4 HR: 157 {beats}/min
Stage 5 HR: 157 {beats}/min
Stage 5 Speed: 0 mph
Stage 6 Grade: 0 %

## 2015-09-01 ENCOUNTER — Encounter: Payer: Medicare HMO | Admitting: Internal Medicine

## 2015-09-07 ENCOUNTER — Ambulatory Visit (INDEPENDENT_AMBULATORY_CARE_PROVIDER_SITE_OTHER): Payer: Medicare HMO | Admitting: Internal Medicine

## 2015-09-07 ENCOUNTER — Encounter: Payer: Self-pay | Admitting: Internal Medicine

## 2015-09-07 VITALS — BP 110/64 | HR 87 | Ht 68.0 in | Wt 245.0 lb

## 2015-09-07 DIAGNOSIS — I483 Typical atrial flutter: Secondary | ICD-10-CM | POA: Diagnosis not present

## 2015-09-07 NOTE — Progress Notes (Signed)
HPI Mr. Calix returns today for followup of atrial flutter, s/p catheter ablation. He is a 60 yo man with multiple medical problems including severe vascular disease, s/p left AkA. He has a h/o HTN, and CAD. He was found to have atrial flutter and underwent catheter ablation. He remains on Eliquis. No anginal symptoms. He has chronic dyspnea. He is sedentary.  Allergies  Allergen Reactions  . Niaspan [Niacin Er] Other (See Comments)    Face was red and hot     Current Outpatient Prescriptions  Medication Sig Dispense Refill  . albuterol (PROVENTIL HFA;VENTOLIN HFA) 108 (90 BASE) MCG/ACT inhaler Inhale 1-2 puffs into the lungs every 6 (six) hours as needed for wheezing or shortness of breath.    Marland Kitchen apixaban (ELIQUIS) 5 MG TABS tablet Take 1 tablet (5 mg total) by mouth 2 (two) times daily. 60 tablet 0  . aspirin EC 81 MG tablet Take 1 tablet (81 mg total) by mouth daily. 30 tablet 0  . atorvastatin (LIPITOR) 40 MG tablet Take 40 mg by mouth daily.    . cholecalciferol (VITAMIN D) 1000 UNITS tablet Take 2,000 Units by mouth 2 (two) times daily.    . furosemide (LASIX) 40 MG tablet Take 1 tablet (40 mg total) by mouth daily. 30 tablet 0  . gabapentin (NEURONTIN) 600 MG tablet Take 600 mg by mouth 4 (four) times daily.     . insulin aspart (NOVOLOG) 100 UNIT/ML injection Inject 0-15 Units into the skin 3 (three) times daily with meals. fsg <120 no insulin  fsg 120-180- 2 units  fsg 180-220- 4 units fsg 220-260- 6 units  fsg 260-300 8 units fsg 300-350- 10 units fsg 350-400 -12 units  fsg >400 , call MD 10 mL 11  . lisinopril (PRINIVIL,ZESTRIL) 2.5 MG tablet Take 2.5 mg by mouth daily.    . Magnesium Oxide 420 MG TABS Take 1 tablet by mouth daily.    . metoprolol (LOPRESSOR) 50 MG tablet Take 25 mg by mouth 2 (two) times daily.    Marland Kitchen omeprazole (PRILOSEC) 20 MG capsule Take 20 mg by mouth daily.    Marland Kitchen oxybutynin (DITROPAN-XL) 10 MG 24 hr tablet Take 10 mg by mouth every morning.    Marland Kitchen  oxyCODONE (ROXICODONE) 15 MG immediate release tablet Take 15 mg by mouth every 8 (eight) hours as needed for pain.     No current facility-administered medications for this visit.     Past Medical History  Diagnosis Date  . Stroke (HCC)   . Coronary artery disease   . Diabetes mellitus without complication (HCC)   . Hypertension   . MI (myocardial infarction) (HCC)     x2  . Hyperlipidemia   . GERD (gastroesophageal reflux disease)   . Diabetic neuropathy (HCC)   . Diabetic retinopathy (HCC)   . PNA (pneumonia)   . CVA (cerebral infarction) 2007  . Diabetes (HCC)     ROS:   All systems reviewed and negative except as noted in the HPI.   Past Surgical History  Procedure Laterality Date  . Below knee leg amputation Left   . Appendectomy    . Percutaneous placement intravascular stent cervical carotid artery Right   . Electrophysiologic study N/A 08/03/2015    Procedure: A-Flutter Ablation;  Surgeon: Marinus Maw, MD;  Location: [facility] INVASIVE CV LAB;  Service: Cardiovascular;  Laterality: N/A;     Family History  Problem Relation Age of Onset  . Heart disease Father   .  Diabetes Father   . Diabetes Sister      Social History   Social History  . Marital Status: Divorced    Spouse Name: N/A  . Number of Children: N/A  . Years of Education: N/A   Occupational History  . Not on file.   Social History Main Topics  . Smoking status: Former Games developer  . Smokeless tobacco: Not on file  . Alcohol Use: No  . Drug Use: No  . Sexual Activity: Not on file   Other Topics Concern  . Not on file   Social History Narrative     BP 110/64 mmHg  Pulse 87  Ht  (1.727 m)  Wt 245 lb (111.131 kg)  BMI 37.26 kg/m2  Physical Exam:  Chronically ill appearing 60 yo woman, NAD HEENT: Unremarkable Neck:  6 cm JVD, no thyromegally Lymphatics:  No adenopathy Back:  No CVA tenderness Lungs:  Clear with no wheezes HEART:  IRegular rate rhythm, no murmurs, no rubs, no  clicks Abd:  soft, positive bowel sounds, no organomegally, no rebound, no guarding Ext:  2 plus pulses, no edema, no cyanosis, no clubbing Skin:  No rashes no nodules Neuro:  CN II through XII intact, motor grossly intact  EKG - NSR  Assess/Plan: 1. Atrial flutter - he is doing well, s/p catheter ablation. He is on Eliquis. I will defer decision to stop this to Dr. Jacinto Halim but from my perspective, it would be ok to do so.  2. CAD - he is s/p stenting remotely. He may need re-initiation of Plavix.  3. HTN - his blood pressure is well controlled. Will follow.  Leonia Reeves.D.

## 2015-09-07 NOTE — Patient Instructions (Signed)
Medication Instructions:  Your physician recommends that you continue on your current medications as directed. Please refer to the Current Medication list given to you today.  Labwork: None ordered.  Testing/Procedures: None ordered.  Follow-Up: Your physician recommends that you schedule a follow-up appointment as needed.   Any Other Special Instructions Will Be Listed Below (If Applicable).     If you need a refill on your cardiac medications before your next appointment, please call your pharmacy.   

## 2016-01-06 ENCOUNTER — Emergency Department (HOSPITAL_COMMUNITY): Payer: Medicare HMO

## 2016-01-06 ENCOUNTER — Encounter (HOSPITAL_COMMUNITY): Payer: Self-pay | Admitting: Emergency Medicine

## 2016-01-06 ENCOUNTER — Inpatient Hospital Stay (HOSPITAL_COMMUNITY)
Admission: EM | Admit: 2016-01-06 | Discharge: 2016-01-10 | DRG: 872 | Disposition: A | Discharge status: Home Health | Payer: Medicare HMO | Attending: Internal Medicine | Admitting: Internal Medicine

## 2016-01-06 DIAGNOSIS — Z951 Presence of aortocoronary bypass graft: Secondary | ICD-10-CM | POA: Diagnosis not present

## 2016-01-06 DIAGNOSIS — Z833 Family history of diabetes mellitus: Secondary | ICD-10-CM

## 2016-01-06 DIAGNOSIS — I878 Other specified disorders of veins: Secondary | ICD-10-CM | POA: Diagnosis present

## 2016-01-06 DIAGNOSIS — E669 Obesity, unspecified: Secondary | ICD-10-CM | POA: Diagnosis present

## 2016-01-06 DIAGNOSIS — Z7982 Long term (current) use of aspirin: Secondary | ICD-10-CM | POA: Diagnosis not present

## 2016-01-06 DIAGNOSIS — I252 Old myocardial infarction: Secondary | ICD-10-CM

## 2016-01-06 DIAGNOSIS — Z87891 Personal history of nicotine dependence: Secondary | ICD-10-CM

## 2016-01-06 DIAGNOSIS — Z89512 Acquired absence of left leg below knee: Secondary | ICD-10-CM | POA: Diagnosis not present

## 2016-01-06 DIAGNOSIS — E785 Hyperlipidemia, unspecified: Secondary | ICD-10-CM | POA: Diagnosis present

## 2016-01-06 DIAGNOSIS — Z7901 Long term (current) use of anticoagulants: Secondary | ICD-10-CM

## 2016-01-06 DIAGNOSIS — Z6837 Body mass index (BMI) 37.0-37.9, adult: Secondary | ICD-10-CM

## 2016-01-06 DIAGNOSIS — I2510 Atherosclerotic heart disease of native coronary artery without angina pectoris: Secondary | ICD-10-CM | POA: Diagnosis present

## 2016-01-06 DIAGNOSIS — R000 Tachycardia, unspecified: Secondary | ICD-10-CM | POA: Diagnosis present

## 2016-01-06 DIAGNOSIS — E1142 Type 2 diabetes mellitus with diabetic polyneuropathy: Secondary | ICD-10-CM | POA: Diagnosis present

## 2016-01-06 DIAGNOSIS — A419 Sepsis, unspecified organism: Principal | ICD-10-CM | POA: Diagnosis present

## 2016-01-06 DIAGNOSIS — E11319 Type 2 diabetes mellitus with unspecified diabetic retinopathy without macular edema: Secondary | ICD-10-CM | POA: Diagnosis present

## 2016-01-06 DIAGNOSIS — I10 Essential (primary) hypertension: Secondary | ICD-10-CM | POA: Diagnosis present

## 2016-01-06 DIAGNOSIS — E1165 Type 2 diabetes mellitus with hyperglycemia: Secondary | ICD-10-CM | POA: Diagnosis present

## 2016-01-06 DIAGNOSIS — Z79899 Other long term (current) drug therapy: Secondary | ICD-10-CM | POA: Diagnosis not present

## 2016-01-06 DIAGNOSIS — R32 Unspecified urinary incontinence: Secondary | ICD-10-CM | POA: Diagnosis present

## 2016-01-06 DIAGNOSIS — L8990 Pressure ulcer of unspecified site, unspecified stage: Secondary | ICD-10-CM

## 2016-01-06 DIAGNOSIS — N179 Acute kidney failure, unspecified: Secondary | ICD-10-CM | POA: Diagnosis present

## 2016-01-06 DIAGNOSIS — Z89431 Acquired absence of right foot: Secondary | ICD-10-CM | POA: Diagnosis not present

## 2016-01-06 DIAGNOSIS — K219 Gastro-esophageal reflux disease without esophagitis: Secondary | ICD-10-CM | POA: Diagnosis present

## 2016-01-06 DIAGNOSIS — Z8249 Family history of ischemic heart disease and other diseases of the circulatory system: Secondary | ICD-10-CM

## 2016-01-06 DIAGNOSIS — I4892 Unspecified atrial flutter: Secondary | ICD-10-CM | POA: Diagnosis present

## 2016-01-06 DIAGNOSIS — E860 Dehydration: Secondary | ICD-10-CM | POA: Diagnosis present

## 2016-01-06 DIAGNOSIS — L0390 Cellulitis, unspecified: Secondary | ICD-10-CM | POA: Diagnosis present

## 2016-01-06 DIAGNOSIS — L03115 Cellulitis of right lower limb: Secondary | ICD-10-CM

## 2016-01-06 DIAGNOSIS — Z8673 Personal history of transient ischemic attack (TIA), and cerebral infarction without residual deficits: Secondary | ICD-10-CM | POA: Diagnosis not present

## 2016-01-06 LAB — URINE MICROSCOPIC-ADD ON

## 2016-01-06 LAB — CBG MONITORING, ED
GLUCOSE-CAPILLARY: 192 mg/dL — AB (ref 65–99)
Glucose-Capillary: 195 mg/dL — ABNORMAL HIGH (ref 65–99)

## 2016-01-06 LAB — URINALYSIS, ROUTINE W REFLEX MICROSCOPIC
BILIRUBIN URINE: NEGATIVE
Glucose, UA: 250 mg/dL — AB
Ketones, ur: NEGATIVE mg/dL
NITRITE: NEGATIVE
PH: 5 (ref 5.0–8.0)
Protein, ur: 100 mg/dL — AB
SPECIFIC GRAVITY, URINE: 1.02 (ref 1.005–1.030)

## 2016-01-06 LAB — I-STAT CHEM 8, ED
BUN: 34 mg/dL — ABNORMAL HIGH (ref 6–20)
CREATININE: 1.5 mg/dL — AB (ref 0.61–1.24)
Calcium, Ion: 1.03 mmol/L — ABNORMAL LOW (ref 1.12–1.23)
Chloride: 93 mmol/L — ABNORMAL LOW (ref 101–111)
Glucose, Bld: 184 mg/dL — ABNORMAL HIGH (ref 65–99)
HEMATOCRIT: 42 % (ref 39.0–52.0)
HEMOGLOBIN: 14.3 g/dL (ref 13.0–17.0)
POTASSIUM: 3.8 mmol/L (ref 3.5–5.1)
Sodium: 133 mmol/L — ABNORMAL LOW (ref 135–145)
TCO2: 25 mmol/L (ref 0–100)

## 2016-01-06 LAB — CBC WITH DIFFERENTIAL/PLATELET
BASOS ABS: 0 10*3/uL (ref 0.0–0.1)
BASOS PCT: 0 %
EOS ABS: 0 10*3/uL (ref 0.0–0.7)
Eosinophils Relative: 0 %
HCT: 39.1 % (ref 39.0–52.0)
Hemoglobin: 13.8 g/dL (ref 13.0–17.0)
Lymphocytes Relative: 10 %
Lymphs Abs: 1.6 10*3/uL (ref 0.7–4.0)
MCH: 29.5 pg (ref 26.0–34.0)
MCHC: 35.3 g/dL (ref 30.0–36.0)
MCV: 83.5 fL (ref 78.0–100.0)
MONO ABS: 0.9 10*3/uL (ref 0.1–1.0)
MONOS PCT: 5 %
NEUTROS ABS: 13.3 10*3/uL — AB (ref 1.7–7.7)
Neutrophils Relative %: 85 %
PLATELETS: 141 10*3/uL — AB (ref 150–400)
RBC: 4.68 MIL/uL (ref 4.22–5.81)
RDW: 17.1 % — AB (ref 11.5–15.5)
WBC: 15.8 10*3/uL — ABNORMAL HIGH (ref 4.0–10.5)

## 2016-01-06 LAB — GLUCOSE, CAPILLARY
GLUCOSE-CAPILLARY: 240 mg/dL — AB (ref 65–99)
Glucose-Capillary: 264 mg/dL — ABNORMAL HIGH (ref 65–99)
Glucose-Capillary: 255 mg/dL — ABNORMAL HIGH (ref 65–99)

## 2016-01-06 LAB — COMPREHENSIVE METABOLIC PANEL
ALBUMIN: 3.4 g/dL — AB (ref 3.5–5.0)
ALT: 15 U/L — ABNORMAL LOW (ref 17–63)
ANION GAP: 13 (ref 5–15)
AST: 24 U/L (ref 15–41)
Alkaline Phosphatase: 80 U/L (ref 38–126)
BILIRUBIN TOTAL: 2.4 mg/dL — AB (ref 0.3–1.2)
BUN: 38 mg/dL — ABNORMAL HIGH (ref 6–20)
CALCIUM: 8.2 mg/dL — AB (ref 8.9–10.3)
CO2: 24 mmol/L (ref 22–32)
CREATININE: 1.7 mg/dL — AB (ref 0.61–1.24)
Chloride: 94 mmol/L — ABNORMAL LOW (ref 101–111)
GFR calc Af Amer: 48 mL/min — ABNORMAL LOW (ref >60)
GFR calc non Af Amer: 41 mL/min — ABNORMAL LOW (ref >60)
GLUCOSE: 182 mg/dL — AB (ref 65–99)
POTASSIUM: 3.8 mmol/L (ref 3.5–5.1)
SODIUM: 131 mmol/L — AB (ref 135–145)
TOTAL PROTEIN: 7.1 g/dL (ref 6.5–8.1)

## 2016-01-06 LAB — I-STAT TROPONIN, ED: TROPONIN I, POC: 0.03 ng/mL (ref 0.00–0.08)

## 2016-01-06 LAB — PROCALCITONIN: PROCALCITONIN: 1.07 ng/mL

## 2016-01-06 LAB — I-STAT CG4 LACTIC ACID, ED: Lactic Acid, Venous: 1.69 mmol/L (ref 0.5–1.9)

## 2016-01-06 LAB — PROTIME-INR
INR: 1.93 — AB (ref 0.00–1.49)
Prothrombin Time: 21.3 seconds — ABNORMAL HIGH (ref 11.6–15.2)

## 2016-01-06 MED ORDER — DOCUSATE SODIUM 100 MG PO CAPS
100.0000 mg | ORAL_CAPSULE | Freq: Two times a day (BID) | ORAL | Status: DC
  Administered 2016-01-06 – 2016-01-09 (×6): 100 mg via ORAL
  Filled 2016-01-06 (×8): qty 1

## 2016-01-06 MED ORDER — ACETAMINOPHEN 650 MG RE SUPP: 650.0000 mg | Freq: Four times a day (QID) | RECTAL | Status: DC | PRN

## 2016-01-06 MED ORDER — VANCOMYCIN HCL IN DEXTROSE 1-5 GM/200ML-% IV SOLN: 1000.0000 mg | Freq: Once | INTRAVENOUS | Status: DC

## 2016-01-06 MED ORDER — ONDANSETRON HCL 4 MG PO TABS: 4.0000 mg | ORAL_TABLET | Freq: Four times a day (QID) | ORAL | Status: DC | PRN

## 2016-01-06 MED ORDER — VANCOMYCIN HCL IN DEXTROSE 750-5 MG/150ML-% IV SOLN
750.0000 mg | Freq: Two times a day (BID) | INTRAVENOUS | Status: DC
  Administered 2016-01-06 – 2016-01-08 (×4): 750 mg via INTRAVENOUS
  Filled 2016-01-06 (×5): qty 150

## 2016-01-06 MED ORDER — ACETAMINOPHEN 325 MG PO TABS
650.0000 mg | ORAL_TABLET | Freq: Four times a day (QID) | ORAL | Status: DC | PRN
  Administered 2016-01-07 – 2016-01-08 (×2): 650 mg via ORAL
  Filled 2016-01-06 (×2): qty 2

## 2016-01-06 MED ORDER — SODIUM CHLORIDE 0.9 % IV SOLN: INTRAVENOUS | Status: DC

## 2016-01-06 MED ORDER — ASPIRIN EC 81 MG PO TBEC
81.0000 mg | DELAYED_RELEASE_TABLET | Freq: Every day | ORAL | Status: DC
  Administered 2016-01-06 – 2016-01-10 (×5): 81 mg via ORAL
  Filled 2016-01-06 (×5): qty 1

## 2016-01-06 MED ORDER — OXYCODONE HCL 5 MG PO TABS
15.0000 mg | ORAL_TABLET | Freq: Three times a day (TID) | ORAL | Status: DC | PRN
  Administered 2016-01-06 – 2016-01-10 (×11): 15 mg via ORAL
  Filled 2016-01-06 (×14): qty 3

## 2016-01-06 MED ORDER — OXYBUTYNIN CHLORIDE ER 10 MG PO TB24
10.0000 mg | ORAL_TABLET | Freq: Every day | ORAL | Status: DC
  Administered 2016-01-06 – 2016-01-10 (×5): 10 mg via ORAL
  Filled 2016-01-06 (×7): qty 1

## 2016-01-06 MED ORDER — SODIUM CHLORIDE 0.9 % IV BOLUS (SEPSIS)
1000.0000 mL | Freq: Once | INTRAVENOUS | Status: AC
  Administered 2016-01-06: 1000 mL via INTRAVENOUS

## 2016-01-06 MED ORDER — GABAPENTIN 300 MG PO CAPS
600.0000 mg | ORAL_CAPSULE | Freq: Four times a day (QID) | ORAL | Status: DC
  Administered 2016-01-06 – 2016-01-10 (×18): 600 mg via ORAL
  Filled 2016-01-06 (×18): qty 2

## 2016-01-06 MED ORDER — METOPROLOL TARTRATE 25 MG PO TABS
25.0000 mg | ORAL_TABLET | Freq: Two times a day (BID) | ORAL | Status: DC
  Administered 2016-01-06 – 2016-01-10 (×9): 25 mg via ORAL
  Filled 2016-01-06 (×9): qty 1

## 2016-01-06 MED ORDER — MAGNESIUM OXIDE 400 (241.3 MG) MG PO TABS
400.0000 mg | ORAL_TABLET | Freq: Every day | ORAL | Status: DC
  Administered 2016-01-06 – 2016-01-10 (×5): 400 mg via ORAL
  Filled 2016-01-06 (×5): qty 1

## 2016-01-06 MED ORDER — ALBUTEROL SULFATE (2.5 MG/3ML) 0.083% IN NEBU
2.5000 mg | INHALATION_SOLUTION | Freq: Four times a day (QID) | RESPIRATORY_TRACT | Status: DC | PRN
  Administered 2016-01-07 – 2016-01-08 (×2): 2.5 mg via RESPIRATORY_TRACT
  Filled 2016-01-06 (×2): qty 3

## 2016-01-06 MED ORDER — ONDANSETRON HCL 4 MG/2ML IJ SOLN: 4.0000 mg | Freq: Four times a day (QID) | INTRAMUSCULAR | Status: DC | PRN

## 2016-01-06 MED ORDER — SODIUM CHLORIDE 0.9 % IV SOLN
INTRAVENOUS | Status: DC
  Administered 2016-01-06 – 2016-01-10 (×8): via INTRAVENOUS

## 2016-01-06 MED ORDER — APIXABAN 5 MG PO TABS
5.0000 mg | ORAL_TABLET | Freq: Two times a day (BID) | ORAL | Status: DC
  Administered 2016-01-06 – 2016-01-10 (×8): 5 mg via ORAL
  Filled 2016-01-06 (×8): qty 1

## 2016-01-06 MED ORDER — SODIUM CHLORIDE 0.9 % IV SOLN
1500.0000 mg | Freq: Once | INTRAVENOUS | Status: AC
  Administered 2016-01-06: 1500 mg via INTRAVENOUS
  Filled 2016-01-06: qty 1500

## 2016-01-06 MED ORDER — MAGNESIUM OXIDE 420 MG PO TABS: 1.0000 | ORAL_TABLET | Freq: Every day | ORAL | Status: DC

## 2016-01-06 MED ORDER — ATORVASTATIN CALCIUM 40 MG PO TABS
40.0000 mg | ORAL_TABLET | Freq: Every day | ORAL | Status: DC
  Administered 2016-01-06 – 2016-01-10 (×5): 40 mg via ORAL
  Filled 2016-01-06 (×5): qty 1

## 2016-01-06 MED ORDER — SODIUM CHLORIDE 0.9 % IV BOLUS (SEPSIS)
500.0000 mL | Freq: Once | INTRAVENOUS | Status: AC
  Administered 2016-01-06: 500 mL via INTRAVENOUS

## 2016-01-06 MED ORDER — SACCHAROMYCES BOULARDII 250 MG PO CAPS
250.0000 mg | ORAL_CAPSULE | Freq: Two times a day (BID) | ORAL | Status: DC
  Administered 2016-01-06 – 2016-01-10 (×9): 250 mg via ORAL
  Filled 2016-01-06 (×9): qty 1

## 2016-01-06 MED ORDER — ACETAMINOPHEN 500 MG PO TABS
1000.0000 mg | ORAL_TABLET | Freq: Once | ORAL | Status: AC
  Administered 2016-01-06: 1000 mg via ORAL
  Filled 2016-01-06: qty 2

## 2016-01-06 MED ORDER — PIPERACILLIN-TAZOBACTAM 3.375 G IVPB
3.3750 g | Freq: Three times a day (TID) | INTRAVENOUS | Status: DC
  Administered 2016-01-06 – 2016-01-10 (×13): 3.375 g via INTRAVENOUS
  Filled 2016-01-06 (×14): qty 50

## 2016-01-06 MED ORDER — PANTOPRAZOLE SODIUM 40 MG PO TBEC
40.0000 mg | DELAYED_RELEASE_TABLET | Freq: Every day | ORAL | Status: DC
Start: 2016-01-06 — End: 2016-01-10
  Administered 2016-01-06 – 2016-01-10 (×5): 40 mg via ORAL
  Filled 2016-01-06 (×5): qty 1

## 2016-01-06 MED ORDER — INSULIN ASPART 100 UNIT/ML ~~LOC~~ SOLN
0.0000 [IU] | Freq: Three times a day (TID) | SUBCUTANEOUS | Status: DC
  Administered 2016-01-06: 8 [IU] via SUBCUTANEOUS
  Administered 2016-01-06 – 2016-01-07 (×2): 5 [IU] via SUBCUTANEOUS
  Administered 2016-01-07 (×2): 11 [IU] via SUBCUTANEOUS
  Administered 2016-01-08 (×3): 5 [IU] via SUBCUTANEOUS
  Administered 2016-01-09 (×2): 8 [IU] via SUBCUTANEOUS
  Administered 2016-01-09 – 2016-01-10 (×4): 5 [IU] via SUBCUTANEOUS

## 2016-01-06 MED ORDER — PIPERACILLIN-TAZOBACTAM 3.375 G IVPB 30 MIN
3.3750 g | Freq: Once | INTRAVENOUS | Status: AC
  Administered 2016-01-06: 3.375 g via INTRAVENOUS
  Filled 2016-01-06: qty 50

## 2016-01-06 MED ORDER — VITAMIN D3 25 MCG (1000 UNIT) PO TABS
2000.0000 [IU] | ORAL_TABLET | Freq: Two times a day (BID) | ORAL | Status: DC
  Administered 2016-01-06 – 2016-01-10 (×8): 2000 [IU] via ORAL
  Filled 2016-01-06 (×8): qty 2

## 2016-01-06 NOTE — Care Management Note (Signed)
Case Management Note  Patient Details  Name: Clayton [name] MRN: [id number] Date of Birth: 17-Apr-1956  Subjective/Objective: 60 y/o m admitted w/RLE cellulitis. From [facility]. PT cons-await recc. If d/c back to indep liv will need ambulance non emergency transp(PTAR)                   Action/Plan:d/c plan home.   Expected Discharge Date:                  Expected Discharge Plan:  Home w Home Health Services  In-House Referral:  Clinical Social Work  Discharge planning Services  CM Consult  Post Acute Care Choice:  Durable Medical Equipment (Has rw, w/c) Choice offered to:     DME Arranged:    DME Agency:     HH Arranged:    HH Agency:     Status of Service:  In process, will continue to follow  If discussed at Long Length of Stay Meetings, dates discussed:    Additional Comments:  Clayton [name], Clayton Gabor, RN 01/06/2016, 2:43 PM

## 2016-01-06 NOTE — Evaluation (Signed)
Physical Therapy Evaluation Patient Details Name: ROMEN YUTZY MRN: [id number] DOB: May 16, 1956 Today's Date: 01/06/2016   History of Present Illness  60 y.o. male with medical history significant for DM2, stroke, Aflutter, MI, neuropathy, Lt BKA, Rt transmetatarsal ampmuation and  admitted for RLE cellulitis and weakness  Clinical Impression  Pt admitted with above diagnosis. Pt currently with functional limitations due to the deficits listed below (see PT Problem List).  Pt will benefit from skilled PT to increase their independence and safety with mobility to allow discharge to the venue listed below.   Pt presents with generalized weakness and decreased mobility.  Pt typically modified independent with prosthesis and equipment however reports he has been unable to stand due to weakness and currently requiring increased assist for bed mobility.  Recommend SNF upon d/c at this time.     Follow Up Recommendations SNF    Equipment Recommendations  None recommended by PT    Recommendations for Other Services       Precautions / Restrictions Precautions Precautions: Fall Precaution Comments: needs R shoe (trans met amp) and L prosthesis (BKA) - states he does not have liner with him at hospital      Mobility  Bed Mobility Overal bed mobility: Needs Assistance Bed Mobility: Supine to Sit;Sit to Supine     Supine to sit: Max assist;HOB elevated Sit to supine: Mod assist   General bed mobility comments: verbal cues for self assist, required assist for trunk upright even utilizing rails and LEs onto bed   Transfers Overall transfer level:  (pt declined today due to weakness, also would need +2 for safety)                  Ambulation/Gait                Stairs            Wheelchair Mobility    Modified Rankin (Stroke Patients Only)       Balance Overall balance assessment: Needs assistance Sitting-balance support: Bilateral upper extremity  supported;Feet supported Sitting balance-Leahy Scale: Fair                                       Pertinent Vitals/Pain Pain Assessment: No/denies pain    Home Living       Type of Home: Independent living facility         Home Equipment: Gilford Rile - 2 wheels;Shower seat;Bedside commode;Electric scooter;Wheelchair - manual;Grab bars - toilet;Grab bars - tub/shower;Hospital bed      Prior Function Level of Independence: Independent with assistive device(s)         Comments: pt states he can squat pivot transfer to electric scooter without physical assist      Hand Dominance        Extremity/Trunk Assessment   Upper Extremity Assessment: Generalized weakness           Lower Extremity Assessment: Generalized weakness;RLE deficits/detail;LLE deficits/detail RLE Deficits / Details: hx of R transmet amp, grossly 3/5 throughout LLE Deficits / Details: hx of L BKA, able to flex/extend knee, 2+/5 hip strength      Communication   Communication: Expressive difficulties  Cognition Arousal/Alertness: Awake/alert Behavior During Therapy: WFL for tasks assessed/performed Overall Cognitive Status: Within Functional Limits for tasks assessed  General Comments      Exercises        Assessment/Plan    PT Assessment Patient needs continued PT services  PT Diagnosis Generalized weakness   PT Problem List Decreased strength;Decreased activity tolerance;Decreased mobility;Obesity  PT Treatment Interventions DME instruction;Functional mobility training;Patient/family education;Therapeutic activities;Therapeutic exercise;Wheelchair mobility training   PT Goals (Current goals can be found in the Care Plan section) Acute Rehab PT Goals PT Goal Formulation: With patient Time For Goal Achievement: 01/13/16 Potential to Achieve Goals: Good    Frequency Min 3X/week   Barriers to discharge        Co-evaluation                End of Session   Activity Tolerance: Patient limited by fatigue Patient left: in bed;with call bell/phone within reach;with bed alarm set Nurse Communication: Mobility status         Time: 6967-8938 PT Time Calculation (min) (ACUTE ONLY): 17 min   Charges:   PT Evaluation $PT Eval Moderate Complexity: 1 Procedure     PT G Codes:        Lonie Newsham,[name] 01/06/2016, 4:10 PM Carmelia Bake, PT, DPT 01/06/2016 Pager: [phone]

## 2016-01-06 NOTE — Progress Notes (Signed)
Pharmacy Antibiotic Note  Clayton [name] is a 60 y.o. male admitted on 01/06/2016 with sepsis.  Pharmacy has been consulted for Vancomycin and Zosyn  dosing.  Plan:  After vancomycin 1500mg  iv x1 ordered, then Vancomycin 750mg   IV every 12 hours.  Goal trough 15-20 mcg/mL. Zosyn 3.375g IV q8h (4 hour infusion).  Weight: 244 lb 11.4 oz (111 kg)  Temp (24hrs), Avg:101.8 F (38.8 C), Min:101.8 F (38.8 C), Max:101.8 F (38.8 C)   Recent Labs Lab 01/06/16 0626 01/06/16 0636  WBC 15.8*  --   CREATININE  --  1.50*  LATICACIDVEN  --  1.69    Estimated Creatinine Clearance: 61.7 mL/min (by C-G formula based on Cr of 1.5).    Allergies  Allergen Reactions  . Niaspan [Niacin Er] Other (See Comments)    Face was red and hot    Antimicrobials this admission: Vancomycin 01/06/2016 >> Zosyn 01/06/2016 >>   Dose adjustments this admission: -  Microbiology results: Pending  Thank you for allowing pharmacy to be a part of this patient's care.  Clayton [name], Clayton [name] 01/06/2016 6:59 AM

## 2016-01-06 NOTE — ED Notes (Signed)
Bed: [id number] Expected date:  Expected time:  Means of arrival:  Comments: EMS 60yo M weakness

## 2016-01-06 NOTE — ED Provider Notes (Signed)
TIME SEEN: 5:45 AM  CHIEF COMPLAINT: fever, generalized weakness, right leg redness  HPI: Pt is a 60 y.o. M with history of a fib on Eliquis, CAD, CVA, hypertension, diabetes who presents to the emergency department from [facility] facility with one month of generalized weakness. States that he has been and able to sit upright in the bed and because he has been unable to move he has urinated on himself. States he normally uses a wheelchair to get around but is able to transport himself but was unable to do so this morning. Has a fever here in the emergency department and states he did not realize he had a fever until he was told that by EMS. Has noticed for the past week that he has had redness and swelling in the right lower extremity. 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 Denies any headache, neck pain or neck stiffness, cough, chest pain or shortness of breath, vomiting or diarrhea, abdominal pain or dysuria.  Pt does have a bruise to the LUE which he reports he obtained when he hit his arm on a wall on Saturday.  Did not fall of hit his head.  Full ROM in this arm without bony tenderness.  ROS: See HPI Constitutional:  fever  Eyes: no drainage  ENT: no runny nose   Cardiovascular:  no chest pain  Resp: no SOB  GI: no vomiting GU: no dysuria Integumentary: no rash  Allergy: no hives  Musculoskeletal: Right leg swelling  Neurological: no slurred speech ROS otherwise negative  PAST MEDICAL HISTORY/PAST SURGICAL HISTORY:  Past Medical History  Diagnosis Date  . Stroke (HCC)   . Coronary artery disease   . Diabetes mellitus without complication (HCC)   . Hypertension   . MI (myocardial infarction) (HCC)     x2  . Hyperlipidemia   . GERD (gastroesophageal reflux disease)   . Diabetic neuropathy (HCC)   . Diabetic retinopathy (HCC)   . PNA (pneumonia)   . CVA (cerebral infarction) 2007  . Diabetes (HCC)      MEDICATIONS:  Prior to Admission medications   Medication Sig Start Date End Date Taking? Authorizing Provider  albuterol (PROVENTIL HFA;VENTOLIN HFA) 108 (90 BASE) MCG/ACT inhaler Inhale 1-2 puffs into the lungs every 6 (six) hours as needed for wheezing or shortness of breath.    Historical Provider, MD  apixaban (ELIQUIS) 5 MG TABS tablet Take 1 tablet (5 mg total) by mouth 2 (two) times daily. 06/05/15   Kathlen ModyVijaya Akula, MD  aspirin EC 81 MG tablet Take 1 tablet (81 mg total) by mouth daily. 06/05/15   Kathlen ModyVijaya Akula, MD  atorvastatin (LIPITOR) 40 MG tablet Take 40 mg by mouth daily.    Historical Provider, MD  cholecalciferol (VITAMIN D) 1000 UNITS tablet Take 2,000 Units by mouth 2 (two) times daily.    Historical Provider, MD  furosemide (LASIX) 40 MG tablet Take 1 tablet (40 mg total) by mouth daily. 06/05/15   Kathlen ModyVijaya Akula, MD  gabapentin (NEURONTIN) 600 MG tablet Take 600 mg by mouth 4 (four) times daily.     Historical Provider, MD  insulin aspart (NOVOLOG) 100 UNIT/ML injection Inject 0-15 Units into the skin 3 (three) times daily with meals. fsg <120 no insulin  fsg 120-180- 2 units  fsg 180-220- 4 units fsg 220-260- 6 units  fsg 260-300 8 units fsg 300-350- 10 units fsg 350-400 -12 units  fsg >400 , call MD 06/13/15  Nishant Dhungel, MD  lisinopril (PRINIVIL,ZESTRIL) 2.5 MG tablet Take 2.5 mg by mouth daily.    Historical Provider, MD  Magnesium Oxide 420 MG TABS Take 1 tablet by mouth daily.    Historical Provider, MD  metoprolol (LOPRESSOR) 50 MG tablet Take 25 mg by mouth 2 (two) times daily.    Historical Provider, MD  omeprazole (PRILOSEC) 20 MG capsule Take 20 mg by mouth daily.    Historical Provider, MD  oxybutynin (DITROPAN-XL) 10 MG 24 hr tablet Take 10 mg by mouth every morning.    Historical Provider, MD  oxyCODONE (ROXICODONE) 15 MG immediate release tablet Take 15 mg by mouth every 8 (eight) hours as needed for pain.    Historical Provider, MD    ALLERGIES:   Allergies  Allergen Reactions  . Niaspan [Niacin Er] Other (See Comments)    Face was red and hot    SOCIAL HISTORY:  Social History  Substance Use Topics  . Smoking status: Former Games developer  . Smokeless tobacco: Not on file  . Alcohol Use: No    FAMILY HISTORY: Family History  Problem Relation Age of Onset  . Heart disease Father   . Diabetes Father   . Diabetes Sister     EXAM: BP 151/131 mmHg  Pulse 130  Temp(Src) 101.8 F (38.8 C) (Oral)  Resp 20  SpO2 96% CONSTITUTIONAL: Alert and oriented and responds appropriately to questions. Chronically ill-appearing, febrile, nontoxic HEAD: Normocephalic EYES: Conjunctivae clear, PERRL ENT: normal nose; no rhinorrhea; moist mucous membranes NECK: Supple, no meningismus, no LAD  CARD: Regular and tachycardic; S1 and S2 appreciated; no murmurs, no clicks, no rubs, no gallops RESP: Normal chest excursion without splinting or tachypnea; breath sounds clear and equal bilaterally; no wheezes, no rhonchi, no rales, no hypoxia or respiratory distress, speaking full sentences ABD/GI: Normal bowel sounds; non-distended; soft, non-tender, no rebound, no guarding, no peritoneal signs GU:  Normal external genitalia, circumcised male, normal penile shaft, no blood or discharge at the urethral meatus, no testicular masses or tenderness on exam, no scrotal masses or swelling, no hernias appreciated, 2+ femoral pulses bilaterally; no perineal erythema, warmth, subcutaneous air or crepitus; no high riding testicle, normal bilateral cremasteric reflex.  Chaperone present for exam.  Patient does have beefy redness with satellite lesions noted to the bilateral inguinal folds with yeastlike appearing discharge around the glans of the penis BACK:  The back appears normal and is non-tender to palpation, there is no CVA tenderness EXT: Status post right transmetatarsal amputation and left BKA. Right lower extremity has erythema and warmth from the mid calf to  mid foot circumferentially without fluctuance. There is induration. He has 2+ DP pulse on the side. No joint effusion. Compartments are soft. Normal ROM in all joints; non-tender to palpation; no edema; normal capillary refill; no cyanosis, no calf tenderness or swelling    SKIN: Normal color for age and race; warm; no rash NEURO: Moves all extremities equally, sensation to light touch intact diffusely, cranial nerves II through XII intact PSYCH: The patient's mood and manner are appropriate. Grooming and personal hygiene are appropriate.  MEDICAL DECISION MAKING: Pt here with sepsis.  Pt is febrile and tachycardic.  He is hypertensive.  He appears to have a cellulitis of the RLE that does not involve the previous amputation site.  No signs of septic arthritis on exam.  Will obtain labs, CXR, cultures, urine.  Will give broad spectrum abx and IVF.  He will need admission.  ED PROGRESS:  CXR shows no infiltrate.  Possible UTI.  Pt's BP has improved.  Pt has leukocytosis.  Lactate normal.  Will admit.  [facility]   7:15 AM  Discussed patient's case with hospitalist, Dr. Erenest BlankIkram.  Recommend admission to inpt, tele bed.  I will place holding orders per their request. Patient and family (if present) updated with plan. Care transferred to hospitalist service.  I reviewed all nursing notes, vitals, pertinent old records, EKGs, labs, imaging (as available).   Sepsis - Repeat Assessment  Performed at:    7:21 AM  Vitals     Blood pressure 117/64, pulse 130, temperature 101.8 F (38.8 C), temperature source Oral, resp. rate 35, weight 250 lb (113.399 kg), SpO2 94 %.  Heart:     Tachycardic  Lungs:    CTA  Capillary Refill:   <2 sec  Peripheral Pulse:   Radial pulse palpable  Skin:     Normal Color       EKG Interpretation  Date/Time:  Wednesday January 06 2016 06:00:53 EDT Ventricular Rate:  134 PR Interval:    QRS Duration: 92 QT Interval:  318 QTC Calculation: 475 R Axis:   -14 Text  Interpretation:  Atrial fibrillation Ventricular premature complex Borderline low voltage, extremity leads Anteroseptal infarct, old No longer in sinus rhythm Confirmed by Kristion Holifield,  DO, Nansi Birmingham ([id number]) on 01/06/2016 6:15:03 AM        CRITICAL CARE Performed by: Raelyn NumberWARD, Irisa Grimsley N   Total critical care time: 45 minutes  Critical care time was exclusive of separately billable procedures and treating other patients.  Critical care was necessary to treat or prevent imminent or life-threatening deterioration.  Critical care was time spent personally by me on the following activities: development of treatment plan with patient and/or surrogate as well as nursing, discussions with consultants, evaluation of patient's response to treatment, examination of patient, obtaining history from patient or surrogate, ordering and performing treatments and interventions, ordering and review of laboratory studies, ordering and review of radiographic studies, pulse oximetry and re-evaluation of patient's condition.    Layla MawKristen N Relena Ivancic, DO 01/06/16 [phone]

## 2016-01-06 NOTE — H&P (Signed)
History and Physical  Clayton [name] XBJ:[id number] DOB: 1956/02/02 DOA: 01/06/2016   PCP: [name], [name], PA-C   [facility] from[facility]   Chief Complaint: weakness, fever   HPI: Clayton [name] is a 60 y.o. male with medical history significant for DM2, stroke, Aflutter being admitted for RLE cellulitis. He is a bit somnolent and slow to talk at the moment, but tells me he is here due to weakness, he tells me it has been going on for the last two days. He told the ER staff it was going on for the last month. He also says he has some pain behind his right hip, and tells me it started while he was here in the ER this AM. In any case, he says he has been compliant with all his medications, and has had no changes in weight, appetite or urine output. Denies diarrhea, constipation, says he had a fever for the past day, but no chills, no cough, no SOB, no nausea or vomiting. No therapies prior to arrival.  ED Course: Pt was found to be febrile, tachycardic and hypertensive. CXR unremarkable, [facility] asked to admit for cellulitis and suspected UTI.  Review of Systems: Please see HPI for pertinent positives and negatives. A complete 10 system review of systems are otherwise negative.  Past Medical History  Diagnosis Date  . Stroke (HCC)   . Coronary artery disease   . Diabetes mellitus without complication (HCC)   . Hypertension   . MI (myocardial infarction) (HCC)     x2  . Hyperlipidemia   . GERD (gastroesophageal reflux disease)   . Diabetic neuropathy (HCC)   . Diabetic retinopathy (HCC)   . PNA (pneumonia)   . CVA (cerebral infarction) 2007  . Diabetes [facility](HCC)    Past Surgical History  Procedure Laterality Date  . Below knee leg amputation Left   . Appendectomy    . Percutaneous placement intravascular stent cervical carotid artery Right   . Electrophysiologic study N/A 08/03/2015    Procedure: A-Flutter Ablation;  Surgeon: Marinus MawGregg W Taylor, MD;  [facility];  Service:  Cardiovascular;  Laterality: N/A;    Social History:  reports that he has quit smoking. He does not have any smokeless tobacco history on file. He reports that he does not drink alcohol or use illicit drugs.   Allergies  Allergen Reactions  . Niaspan [Niacin Er] Other (See Comments)    Face was red and hot    Family History  Problem Relation Age of Onset  . Heart disease Father   . Diabetes Father   . Diabetes Sister      Prior to Admission medications   Medication Sig Start Date End Date Taking? Authorizing Provider  albuterol (PROVENTIL HFA;VENTOLIN HFA) 108 (90 BASE) MCG/ACT inhaler Inhale 1-2 puffs into the lungs every 6 (six) hours as needed for wheezing or shortness of breath.    Historical Provider, MD  apixaban (ELIQUIS) 5 MG TABS tablet Take 1 tablet (5 mg total) by mouth 2 (two) times daily. 06/05/15   Kathlen ModyVijaya Akula, MD  aspirin EC 81 MG tablet Take 1 tablet (81 mg total) by mouth daily. 06/05/15   Kathlen ModyVijaya Akula, MD  atorvastatin (LIPITOR) 40 MG tablet Take 40 mg by mouth daily.    Historical Provider, MD  cholecalciferol (VITAMIN D) 1000 UNITS tablet Take 2,000 Units by mouth 2 (two) times daily.    Historical Provider, MD  furosemide (LASIX) 40 MG tablet Take 1 tablet (40 mg  total) by mouth daily. 06/05/15   Kathlen Mody, MD  gabapentin (NEURONTIN) 600 MG tablet Take 600 mg by mouth 4 (four) times daily.     Historical Provider, MD  insulin aspart (NOVOLOG) 100 UNIT/ML injection Inject 0-15 Units into the skin 3 (three) times daily with meals. fsg <120 no insulin  fsg 120-180- 2 units  fsg 180-220- 4 units fsg 220-260- 6 units  fsg 260-300 8 units fsg 300-350- 10 units fsg 350-400 -12 units  fsg >400 , call MD 06/13/15   Theda Belfast Dhungel, MD  lisinopril (PRINIVIL,ZESTRIL) 2.5 MG tablet Take 2.5 mg by mouth daily.    Historical Provider, MD  Magnesium Oxide 420 MG TABS Take 1 tablet by mouth daily.    Historical Provider, MD  metoprolol (LOPRESSOR) 50 MG tablet Take 25 mg  by mouth 2 (two) times daily.    Historical Provider, MD  omeprazole (PRILOSEC) 20 MG capsule Take 20 mg by mouth daily.    Historical Provider, MD  oxybutynin (DITROPAN-XL) 10 MG 24 hr tablet Take 10 mg by mouth every morning.    Historical Provider, MD  oxyCODONE (ROXICODONE) 15 MG immediate release tablet Take 15 mg by mouth every 8 (eight) hours as needed for pain.    Historical Provider, MD    Physical Exam: BP 117/64 mmHg  Pulse 130  Temp(Src) 101.8 F (38.8 C) (Oral)  Resp 35  Wt 113.399 kg (250 lb)  SpO2 94%  General:  Alert but sleepy, oriented, calm, disheveled and somewhat sweaty. Eyes: pupils round and reactive to light and accomodation, clear sclerea Neck: supple, no masses, trachea mildline  Cardiovascular: irregular and tachycardic, no murmurs or rubs, no peripheral edema  Respiratory: clear to auscultation bilaterally, no wheezes, no crackles  Abdomen: soft, nontender, nondistended, obese, normal bowel tones heard  Skin: RLE with TMA, significant warmth, erythema from above ankle to above knee, no open areas, no drainage. Examined right posterior hip/buttock area, no pressure sore, open lesions, not tender to palpation. Both amputation stumps with clean, intact, well healed surgical incisions Musculoskeletal: no joint effusions, normal range of motion, s/p R TMA, and L BKA Psychiatric: appropriate affect, normal speech  Neurologic: extraocular muscles intact, clear speech, moving all extremities with intact sensorium            Labs on Admission:  Basic Metabolic Panel:  Recent Labs Lab 01/06/16 0626 01/06/16 0636  NA 131* 133*  K 3.8 3.8  CL 94* 93*  CO2 24  --   GLUCOSE 182* 184*  BUN 38* 34*  CREATININE 1.70* 1.50*  CALCIUM 8.2*  --    Liver Function Tests:  Recent Labs Lab 01/06/16 0626  AST 24  ALT 15*  ALKPHOS 80  BILITOT 2.4*  PROT 7.1  ALBUMIN 3.4*   No results for input(s): LIPASE, AMYLASE in the last 168 hours. No results for  input(s): AMMONIA in the last 168 hours. CBC:  Recent Labs Lab 01/06/16 0626 01/06/16 0636  WBC 15.8*  --   NEUTROABS 13.3*  --   HGB 13.8 14.3  HCT 39.1 42.0  MCV 83.5  --   PLT 141*  --    Cardiac Enzymes: No results for input(s): CKTOTAL, CKMB, CKMBINDEX, TROPONINI in the last 168 hours.  BNP (last 3 results)  Recent Labs  03/23/15 1630 06/01/15 1235 06/08/15 2053  BNP 112.3* 58.5 81.2    ProBNP (last 3 results) No results for input(s): PROBNP in the last 8760 hours.  CBG:  Recent Labs Lab  01/06/16 0617  GLUCAP 192*    Radiological Exams on Admission: Dg Chest Port 1 View  01/06/2016  CLINICAL DATA:  Weakness, worsening over the past 2 days.  Sepsis. EXAM: PORTABLE CHEST 1 VIEW COMPARISON:  06/10/2015 FINDINGS: Prior sternotomy and CABG. The lungs are clear. There is no large effusion. The pulmonary vasculature is normal. IMPRESSION: No acute cardiopulmonary findings. Electronically Signed   By: Ellery Plunkaniel R Mitchell M.D.   On: 01/06/2016 06:17   Assessment/Plan Present on Admission:  . Cellulitis and Sepsis (HCC) - meeting criteria with leukocytosis, tachycardia, lactate not elevated - treat with empiric Zosyn and Vancomycin - blood and urine cx obtained and will be followed up - gentle IVF today, as patient is hypertensive - will tailor antibiotic therapy as appropriate - Probiotic to reduce risk of Cdiff - if not improving in the AM, might consider imaging to rule out underlying abscess (not highly suspicious of this at this time) . CAD (coronary artery disease) - continue home medications including ASA, beta blocker . DM type 2 with diabetic peripheral neuropathy (HCC) - ADA diet, SSI . HLD (hyperlipidemia) - cont home statin . GERD (gastroesophageal reflux disease) - cont PO PPI . AKI (acute kidney injury) (HCC) - likely as a result of dehydration related to sepsis, IV fluids as above, check renal function daily, if not improving might consider renal US to  rule out obstructive uropathy.  DVT prophylaxis: Eliquis   Code Status: FULL   Family Communication: None   Disposition Plan: Back to [facility] in [place] days.   Consults called: None   Admission status: Inpatient tele   Time spent: 54 minutes  Nijee Heatwole Vergie LivingMohammed Zakaree Mcclenahan MD [facility] Pager [phone]  If 7PM-7AM, please contact night-coverage [email or web address] Password [email or web address]  01/06/2016, 7:36 AM

## 2016-01-06 NOTE — ED Notes (Signed)
Pt BIB EMS from the [facility] facility; pt was unable to sit himself up in bed and had urinated on himself; pt reported that he noticed an increase in weakness in the past two days; pt can normal transport himself from his bed to his wheelchair independently but was unable to do so this morning; denies pain and n/v/d.

## 2016-01-07 ENCOUNTER — Inpatient Hospital Stay (HOSPITAL_COMMUNITY): Payer: Medicare HMO

## 2016-01-07 LAB — BASIC METABOLIC PANEL
ANION GAP: 7 (ref 5–15)
BUN: 28 mg/dL — AB (ref 6–20)
CHLORIDE: 102 mmol/L (ref 101–111)
CO2: 25 mmol/L (ref 22–32)
Calcium: 7.5 mg/dL — ABNORMAL LOW (ref 8.9–10.3)
Creatinine, Ser: 1.45 mg/dL — ABNORMAL HIGH (ref 0.61–1.24)
GFR calc Af Amer: 58 mL/min — ABNORMAL LOW (ref >60)
GFR, EST NON AFRICAN AMERICAN: 50 mL/min — AB (ref >60)
GLUCOSE: 220 mg/dL — AB (ref 65–99)
POTASSIUM: 3.8 mmol/L (ref 3.5–5.1)
Sodium: 134 mmol/L — ABNORMAL LOW (ref 135–145)

## 2016-01-07 LAB — GLUCOSE, CAPILLARY
Glucose-Capillary: 230 mg/dL — ABNORMAL HIGH (ref 65–99)
Glucose-Capillary: 345 mg/dL — ABNORMAL HIGH (ref 65–99)
Glucose-Capillary: 312 mg/dL — ABNORMAL HIGH (ref 65–99)
Glucose-Capillary: 244 mg/dL — ABNORMAL HIGH (ref 65–99)

## 2016-01-07 LAB — URINE CULTURE: CULTURE: NO GROWTH

## 2016-01-07 LAB — CBC
HEMATOCRIT: 32.6 % — AB (ref 39.0–52.0)
HEMOGLOBIN: 10.9 g/dL — AB (ref 13.0–17.0)
MCH: 29.3 pg (ref 26.0–34.0)
MCHC: 33.4 g/dL (ref 30.0–36.0)
MCV: 87.6 fL (ref 78.0–100.0)
Platelets: 128 10*3/uL — ABNORMAL LOW (ref 150–400)
RBC: 3.72 MIL/uL — ABNORMAL LOW (ref 4.22–5.81)
RDW: 18.2 % — ABNORMAL HIGH (ref 11.5–15.5)
WBC: 8.4 10*3/uL (ref 4.0–10.5)

## 2016-01-07 NOTE — Progress Notes (Signed)
CSW received consult for SNF placement, reviewed PT evaluation recommending SNF as well. CSW met with patient re: discharge planning. Patient declined SNF - states that he has been to [facility] SNFs in the past & states that he would prefer to return home at [facility]. RNCM, Juliann Pulse made aware.   No further CSW needs identified - CSW signing off.   Clayton [name], [facility] Clinical Social Worker cell #: [phone]

## 2016-01-07 NOTE — Progress Notes (Signed)
PROGRESS NOTE  Clayton [name] ZOX:[id number] DOB: May 30, 1956 DOA: 01/06/2016 PCP: [name], [name], PA-C  HPI/Recap of past 24 hours: 60 yo male with history of DM2, CVA, Aflutter admitted with cellulitis and suspected UTI. Now with improving cellulitis, AKI that is not improving much. Patient is a bit sleepy this AM, says he hardly slept overnight. Foley was placed yesterday PM due to excoriated groin area and urinary incontinence. He denies nausea, fevers, pain.   Assessment/Plan: . Cellulitis and Sepsis (HCC) - meeting criteria with leukocytosis, tachycardia, lactate not elevated - cont with empiric Zosyn and Vancomycin - blood and urine cx obtained and will be followed up - gentle IVF for AKI - will tailor antibiotic therapy as appropriate - Probiotic to reduce risk of Cdiff . CAD (coronary artery disease) - continue home medications including ASA, beta blocker . DM type 2 with diabetic peripheral neuropathy (HCC) - ADA diet, SSI . HLD (hyperlipidemia) - cont home statin . GERD (gastroesophageal reflux disease) - cont PO PPI . AKI (acute kidney injury) (HCC) - likely as a result of dehydration related to sepsis, IV fluids as above, normal Cr at baseline. Not much improved today so will check Renal US. He had about 1300cc of urine out yesterday.  Code Status: FULL   Family Communication: None present    Disposition Plan: SNF per PT consult when ready, in 2-3 days.    Consultants:  None   Procedures:  None   Antimicrobials:  Vanco/Zosyn 6/28 -->    Objective: Filed Vitals:   01/06/16 2058 01/07/16 0100 01/07/16 0514 01/07/16 0534  BP: 121/69 106/54  104/55  Pulse: 97 91  86  Temp: 100.5 F (38.1 C) 99.1 F (37.3 C) 99.6 F (37.6 C) 98.7 F (37.1 C)  TempSrc: Oral Oral Oral Oral  Resp: 32 19  20  Height:      Weight:      SpO2: 95% 94%  93%    Intake/Output Summary (Last 24 hours) at 01/07/16 0759 Last data filed at 01/07/16 0700  Gross per 24 hour  Intake  1868.75 ml  Output   1300 ml  Net 568.75 ml   Filed Weights   01/06/16 0600 01/06/16 0710 01/06/16 0800  Weight: 111 kg (244 lb 11.4 oz) 113.399 kg (250 lb) 111.177 kg (245 lb 1.6 oz)    Exam:  General: Alert but sleepy, oriented, calm  Eyes: pupils round and reactive to light and accomodation, clear sclerea  Neck: supple, no masses, trachea mildline   Cardiovascular: irregular and tachycardic, no murmurs or rubs, no peripheral edema   Respiratory: clear to auscultation bilaterally, no wheezes, no crackles   Abdomen: soft, nontender, nondistended, obese, normal bowel tones heard   Skin: RLE with TMA, improved warmth, erythema from above ankle to above knee, no open areas, no drainage. Both amputation stumps with clean, intact, well healed surgical incisions  Musculoskeletal: no joint effusions, normal range of motion, s/p R TMA, and L BKA  Psychiatric: appropriate affect, normal speech   Neurologic: extraocular muscles intact, clear speech, moving all extremities with intact sensorium   Data Reviewed: CBC:  Recent Labs Lab 01/06/16 0626 01/06/16 0636 01/07/16 0508  WBC 15.8*  --  8.4  NEUTROABS 13.3*  --   --   HGB 13.8 14.3 10.9*  HCT 39.1 42.0 32.6*  MCV 83.5  --  87.6  PLT 141*  --  128*   Basic Metabolic Panel:  Recent Labs Lab 01/06/16 0626 01/06/16 0636 01/07/16 8119  NA 131* 133* 134*  K 3.8 3.8 3.8  CL 94* 93* 102  CO2 24  --  25  GLUCOSE 182* 184* 220*  BUN 38* 34* 28*  CREATININE 1.70* 1.50* 1.45*  CALCIUM 8.2*  --  7.5*   GFR: Estimated Creatinine Clearance: 63.9 mL/min (by C-G formula based on Cr of 1.45). Liver Function Tests:  Recent Labs Lab 01/06/16 0626  AST 24  ALT 15*  ALKPHOS 80  BILITOT 2.4*  PROT 7.1  ALBUMIN 3.4*   No results for input(s): LIPASE, AMYLASE in the last 168 hours. No results for input(s): AMMONIA in the last 168 hours. Coagulation Profile:  Recent Labs Lab 01/06/16 0809  INR 1.93*   Cardiac  Enzymes: No results for input(s): CKTOTAL, CKMB, CKMBINDEX, TROPONINI in the last 168 hours. BNP (last 3 results) No results for input(s): PROBNP in the last 8760 hours. HbA1C: No results for input(s): HGBA1C in the last 72 hours. CBG:  Recent Labs Lab 01/06/16 0808 01/06/16 1125 01/06/16 1617 01/06/16 2211 01/07/16 0735  GLUCAP 195* 264* 240* 255* 230*   Lipid Profile: No results for input(s): CHOL, HDL, LDLCALC, TRIG, CHOLHDL, LDLDIRECT in the last 72 hours. Thyroid Function Tests: No results for input(s): TSH, T4TOTAL, FREET4, T3FREE, THYROIDAB in the last 72 hours. Anemia Panel: No results for input(s): VITAMINB12, FOLATE, FERRITIN, TIBC, IRON, RETICCTPCT in the last 72 hours. Urine analysis:    Component Value Date/Time   COLORURINE AMBER* 01/06/2016 0614   APPEARANCEUR CLOUDY* 01/06/2016 0614   LABSPEC 1.020 01/06/2016 0614   PHURINE 5.0 01/06/2016 0614   GLUCOSEU 250* 01/06/2016 0614   HGBUR MODERATE* 01/06/2016 0614   BILIRUBINUR NEGATIVE 01/06/2016 0614   KETONESUR NEGATIVE 01/06/2016 0614   PROTEINUR 100* 01/06/2016 0614   UROBILINOGEN 0.2 03/23/2015 1535   NITRITE NEGATIVE 01/06/2016 0614   LEUKOCYTESUR SMALL* 01/06/2016 0614   Sepsis Labs: @LABRCNTIP (procalcitonin:4,lacticidven:4)  ) Recent Results (from the past 240 hour(s))  Blood Culture (routine x 2)     Status: None (Preliminary result)   Collection Time: 01/06/16  6:27 AM  Result Value Ref Range Status   Specimen Description BLOOD LEFT ANTECUBITAL  Final   Special Requests BOTTLES DRAWN AEROBIC AND ANAEROBIC 5ML  Final   Culture   Final    NO GROWTH < 12 HOURS [facility]    Report Status PENDING  Incomplete  Blood Culture (routine x 2)     Status: None (Preliminary result)   Collection Time: 01/06/16  6:40 AM  Result Value Ref Range Status   Specimen Description BLOOD LEFT FOREARM  Final   Special Requests BOTTLES DRAWN AEROBIC AND ANAEROBIC 6CC  Final   Culture    Final    NO GROWTH < 12 HOURS [facility]    Report Status PENDING  Incomplete      Studies: No results found.  Scheduled Meds: . apixaban  5 mg Oral BID  . aspirin EC  81 mg Oral Daily  . atorvastatin  40 mg Oral q1800  . cholecalciferol  2,000 Units Oral BID  . docusate sodium  100 mg Oral BID  . gabapentin  600 mg Oral QID  . insulin aspart  0-15 Units Subcutaneous TID WC  . magnesium oxide  400 mg Oral Daily  . metoprolol  25 mg Oral BID  . oxybutynin  10 mg Oral Daily  . pantoprazole  40 mg Oral Daily  . piperacillin-tazobactam (ZOSYN)  IV  3.375 g Intravenous Q8H  .  saccharomyces boulardii  250 mg Oral BID  . vancomycin  750 mg Intravenous Q12H    Continuous Infusions: . sodium chloride 75 mL/hr at 01/07/16 0532     LOS: 1 day   Time spent: 23 minutes  Mir Vergie LivingMohammed Ikramullah, MD [facility] Pager [phone]  If 7PM-7AM, please contact night-coverage [email or web address] Password [email or web address] 01/07/2016, 7:59 AM

## 2016-01-07 NOTE — Care Management Note (Signed)
Case Management Note  Patient Details  Name: Clayton [name] MRN: [id number] Date of Birth: Feb 02, 1956  Subjective/Objective:Patient declines SNF. Wants home w/HHC-he is familiar with [facility]-rep [facility]-accepted case. Await HHRN/PT/OT/aide,social work,[id number] order. Patient will need PTAR non emergency transp @ d/c.                   Action/Plan:d/c plan return back to [facility].   Expected Discharge Date:                  Expected Discharge Plan:  Home w Home Health Services  In-House Referral:  Clinical Social Work  Discharge planning Services  CM Consult  Post Acute Care Choice:  Durable Medical Equipment (Has rw, w/c) Choice offered to:  Patient  DME Arranged:    DME Agency:     HH Arranged:    [facility]  Status of Service:  In process, will continue to follow  If discussed at Long Length of Stay Meetings, dates discussed:    Additional Comments:  Clayton [name], Clayton Quirion, RN 01/07/2016, 11:59 AM

## 2016-01-07 NOTE — Progress Notes (Signed)
Inpatient Diabetes Program Recommendations  AACE/ADA: New Consensus Statement on Inpatient Glycemic Control (2015)  Target Ranges:  Prepandial:   less than 140 mg/dL      Peak postprandial:   less than 180 mg/dL (1-2 hours)      Critically ill patients:  140 - 180 mg/dL   Lab Results  Component Value Date   GLUCAP 345* 01/07/2016   HGBA1C 5.7* 06/01/2015   Results for [name], Tevon K (MRN [id number]) as of 01/07/2016 14:49  Ref. Range 01/06/2016 11:25 01/06/2016 16:17 01/06/2016 22:11 01/07/2016 07:35 01/07/2016 11:40  Glucose-Capillary Latest Ref Range: 65-99 mg/dL 409264 (H) 811240 (H) 914255 (H) 230 (H) 345 (H)   Review of Glycemic Control  Diabetes history: DM2 Outpatient Diabetes medications: Novolog 0-15 units tidwc Current orders for Inpatient glycemic control: Novolog 0-15 units tidwc  Blood sugars in 200-300s. Needs insulin adjustment.  Inpatient Diabetes Program Recommendations:    Add Levemir 15 units QHS Add Novolog 3 units tidwc for meal coverage insulin. Needs updated HgbA1C to assess glycemic control prior to admission.  Will follow. Thank you. Ailene Ardshonda Kennetta Pavlovic, RD, LDN, CDE Inpatient Diabetes Coordinator [phone]

## 2016-01-08 LAB — BASIC METABOLIC PANEL
Anion gap: 7 (ref 5–15)
BUN: 20 mg/dL (ref 6–20)
CHLORIDE: 103 mmol/L (ref 101–111)
CO2: 24 mmol/L (ref 22–32)
CREATININE: 1.32 mg/dL — AB (ref 0.61–1.24)
Calcium: 7.6 mg/dL — ABNORMAL LOW (ref 8.9–10.3)
GFR calc Af Amer: >60 mL/min (ref >60)
GFR calc non Af Amer: 56 mL/min — ABNORMAL LOW (ref >60)
Glucose, Bld: 230 mg/dL — ABNORMAL HIGH (ref 65–99)
Potassium: 4 mmol/L (ref 3.5–5.1)
SODIUM: 134 mmol/L — AB (ref 135–145)

## 2016-01-08 LAB — CBC
HCT: 32.8 % — ABNORMAL LOW (ref 39.0–52.0)
Hemoglobin: 10.9 g/dL — ABNORMAL LOW (ref 13.0–17.0)
MCH: 28.9 pg (ref 26.0–34.0)
MCHC: 33.2 g/dL (ref 30.0–36.0)
MCV: 87 fL (ref 78.0–100.0)
PLATELETS: 133 10*3/uL — AB (ref 150–400)
RBC: 3.77 MIL/uL — ABNORMAL LOW (ref 4.22–5.81)
RDW: 17.8 % — AB (ref 11.5–15.5)
WBC: 9.3 10*3/uL (ref 4.0–10.5)

## 2016-01-08 LAB — GLUCOSE, CAPILLARY
Glucose-Capillary: 227 mg/dL — ABNORMAL HIGH (ref 65–99)
Glucose-Capillary: 232 mg/dL — ABNORMAL HIGH (ref 65–99)
Glucose-Capillary: 228 mg/dL — ABNORMAL HIGH (ref 65–99)
Glucose-Capillary: 315 mg/dL — ABNORMAL HIGH (ref 65–99)

## 2016-01-08 LAB — MRSA PCR SCREENING: MRSA by PCR: NEGATIVE

## 2016-01-08 MED ORDER — INSULIN DETEMIR 100 UNIT/ML ~~LOC~~ SOLN
15.0000 [IU] | Freq: Every day | SUBCUTANEOUS | Status: DC
Start: 2016-01-08 — End: 2016-01-10
  Administered 2016-01-08 – 2016-01-09 (×2): 15 [IU] via SUBCUTANEOUS
  Filled 2016-01-08 (×3): qty 0.15

## 2016-01-08 MED ORDER — INSULIN ASPART 100 UNIT/ML ~~LOC~~ SOLN
3.0000 [IU] | Freq: Three times a day (TID) | SUBCUTANEOUS | Status: DC
  Administered 2016-01-08 – 2016-01-10 (×9): 3 [IU] via SUBCUTANEOUS

## 2016-01-08 NOTE — Progress Notes (Signed)
PROGRESS NOTE  Clayton [name] UEA:[id number] DOB: 1955/09/11 DOA: 01/06/2016 PCP: [name], [name], PA-C  HPI/Recap of past 24 hours: 60 yo male with history of DM2, CVA, Aflutter admitted with cellulitis and suspected UTI. Now with improving cellulitis, AKI that is improving. More awake this AM. Had a fever to 103 overnight. Currently no complaints.   Assessment/Plan: . Cellulitis and Sepsis (HCC) - sepsis resolved though had a fever overnight - cont with empiric Zosyn and Vancomycin - blood and urine cx obtained and will be followed up, no growth so far - gentle IVF for AKI - will tailor antibiotic therapy as appropriate - Probiotic to reduce risk of Cdiff . CAD (coronary artery disease) - continue home medications including ASA, beta blocker . DM type 2 with diabetic peripheral neuropathy (HCC) - ADA diet, SSI . HLD (hyperlipidemia) - cont home statin . GERD (gastroesophageal reflux disease) - cont PO PPI . AKI (acute kidney injury) (HCC) - likely as a result of dehydration related to sepsis, IV fluids as above, normal Cr at baseline. Renal US without obstruction or other issues, Cr continues to improve.  Code Status: FULL   Family Communication: None present    Disposition Plan: SNF per PT consult when ready for DC, in 1-2 days.    Consultants:  None   Procedures:  None   Antimicrobials:  Vanco/Zosyn 6/28 -->    Objective: Filed Vitals:   01/07/16 2030 01/08/16 0041 01/08/16 0210 01/08/16 0513  BP: 126/74 129/78  115/68  Pulse: 108 109  97  Temp: 99.6 F (37.6 C) 103.2 F (39.6 C) 101.8 F (38.8 C) 100.3 F (37.9 C)  TempSrc: Oral Oral  Oral  Resp: 18 18  18   Height:      Weight:      SpO2: 93% 96%  95%    Intake/Output Summary (Last 24 hours) at 01/08/16 0804 Last data filed at 01/07/16 2300  Gross per 24 hour  Intake   1400 ml  Output   1750 ml  Net   -350 ml   Filed Weights   01/06/16 0600 01/06/16 0710 01/06/16 0800  Weight: 111 kg (244 lb 11.4 oz)  113.399 kg (250 lb) 111.177 kg (245 lb 1.6 oz)    Exam:  General: Alert but sleepy, oriented, calm  Eyes: pupils round and reactive to light and accomodation, clear sclerea  Neck: supple, no masses, trachea mildline   Cardiovascular: irregular and tachycardic, no murmurs or rubs, no peripheral edema   Respiratory: clear to auscultation bilaterally, no wheezes, no crackles   Abdomen: soft, nontender, nondistended, obese, normal bowel tones heard   Skin: RLE with TMA, improved warmth and erythema from above ankle to above knee, no open areas, no drainage. Both amputation stumps with clean, intact, well healed surgical incisions  Musculoskeletal: no joint effusions, normal range of motion, s/p R TMA, and L BKA  Psychiatric: appropriate affect, normal speech   Neurologic: extraocular muscles intact, clear speech, moving all extremities with intact sensorium   Data Reviewed: CBC:  Recent Labs Lab 01/06/16 0626 01/06/16 0636 01/07/16 0508 01/08/16 0403  WBC 15.8*  --  8.4 9.3  NEUTROABS 13.3*  --   --   --   HGB 13.8 14.3 10.9* 10.9*  HCT 39.1 42.0 32.6* 32.8*  MCV 83.5  --  87.6 87.0  PLT 141*  --  128* 133*   Basic Metabolic Panel:  Recent Labs Lab 01/06/16 0626 01/06/16 0636 01/07/16 0508 01/08/16 0403  NA 131* 133*  134* 134*  K 3.8 3.8 3.8 4.0  CL 94* 93* 102 103  CO2 24  --  25 24  GLUCOSE 182* 184* 220* 230*  BUN 38* 34* 28* 20  CREATININE 1.70* 1.50* 1.45* 1.32*  CALCIUM 8.2*  --  7.5* 7.6*   GFR: Estimated Creatinine Clearance: 70.2 mL/min (by C-G formula based on Cr of 1.32). Liver Function Tests:  Recent Labs Lab 01/06/16 0626  AST 24  ALT 15*  ALKPHOS 80  BILITOT 2.4*  PROT 7.1  ALBUMIN 3.4*   No results for input(s): LIPASE, AMYLASE in the last 168 hours. No results for input(s): AMMONIA in the last 168 hours. Coagulation Profile:  Recent Labs Lab 01/06/16 0809  INR 1.93*   Cardiac Enzymes: No results for input(s): CKTOTAL,  CKMB, CKMBINDEX, TROPONINI in the last 168 hours. BNP (last 3 results) No results for input(s): PROBNP in the last 8760 hours. HbA1C: No results for input(s): HGBA1C in the last 72 hours. CBG:  Recent Labs Lab 01/07/16 0735 01/07/16 1140 01/07/16 1622 01/07/16 2105 01/08/16 0746  GLUCAP 230* 345* 312* 244* 227*   Lipid Profile: No results for input(s): CHOL, HDL, LDLCALC, TRIG, CHOLHDL, LDLDIRECT in the last 72 hours. Thyroid Function Tests: No results for input(s): TSH, T4TOTAL, FREET4, T3FREE, THYROIDAB in the last 72 hours. Anemia Panel: No results for input(s): VITAMINB12, FOLATE, FERRITIN, TIBC, IRON, RETICCTPCT in the last 72 hours. Urine analysis:    Component Value Date/Time   COLORURINE AMBER* 01/06/2016 0614   APPEARANCEUR CLOUDY* 01/06/2016 0614   LABSPEC 1.020 01/06/2016 0614   PHURINE 5.0 01/06/2016 0614   GLUCOSEU 250* 01/06/2016 0614   HGBUR MODERATE* 01/06/2016 0614   BILIRUBINUR NEGATIVE 01/06/2016 0614   KETONESUR NEGATIVE 01/06/2016 0614   PROTEINUR 100* 01/06/2016 0614   UROBILINOGEN 0.2 03/23/2015 1535   NITRITE NEGATIVE 01/06/2016 0614   LEUKOCYTESUR SMALL* 01/06/2016 0614   Sepsis Labs: (procalcitonin:4,lacticidven:4)  ) Recent Results (from the past 240 hour(s))  Urine culture     Status: None   Collection Time: 01/06/16  6:14 AM  Result Value Ref Range Status   Specimen Description URINE, CATHETERIZED  Final   Special Requests NONE  Final   Culture NO GROWTH Performed at [facility]   Final   Report Status 01/07/2016 FINAL  Final  Blood Culture (routine x 2)     Status: None (Preliminary result)   Collection Time: 01/06/16  6:27 AM  Result Value Ref Range Status   Specimen Description BLOOD LEFT ANTECUBITAL  Final   Special Requests BOTTLES DRAWN AEROBIC AND ANAEROBIC  Final   Culture   Final    NO GROWTH 1 DAY Performed at [facility]    Report Status PENDING  Incomplete  Blood Culture (routine x 2)      Status: None (Preliminary result)   Collection Time: 01/06/16  6:40 AM  Result Value Ref Range Status   Specimen Description BLOOD LEFT FOREARM  Final   Special Requests BOTTLES DRAWN AEROBIC AND ANAEROBIC 6CC  Final   Culture   Final    NO GROWTH 1 DAY Performed at [facility]    Report Status PENDING  Incomplete      Studies: US Renal  01/07/2016  CLINICAL DATA:  Acute renal insufficiency; history of sepsis and chronic CHF EXAM: RENAL / URINARY TRACT ULTRASOUND COMPLETE COMPARISON:  Renal ultrasound of June 09, 2015 FINDINGS: Right Kidney: Length: 13.9 cm. The renal cortical echotexture is approximately equal to that  for slightly greater than that of the adjacent liver. There is no mass or hydronephrosis. Left Kidney: Length: 13.7 cm. The renal cortical echotexture on the left is similar to that on the right. There is an upper pole simple appearing cyst measuring 1.5 cm in greatest dimension. There is no hydronephrosis. Bladder: The urinary bladder is decompressed by a Foley catheter. IMPRESSION: Mild increase in renal cortical echotexture consistent with medical renal disease. There is no evidence of obstruction. No stones are evident. The urinary bladder was decompressed by a Foley catheter. Electronically Signed   By: David  SwazilandJordan M.D.   On: 01/07/2016 08:45    Scheduled Meds: . apixaban  5 mg Oral BID  . aspirin EC  81 mg Oral Daily  . atorvastatin  40 mg Oral q1800  . cholecalciferol  2,000 Units Oral BID  . docusate sodium  100 mg Oral BID  . gabapentin  600 mg Oral QID  . insulin aspart  0-15 Units Subcutaneous TID WC  . insulin aspart  3 Units Subcutaneous TID WC  . insulin detemir  15 Units Subcutaneous QHS  . magnesium oxide  400 mg Oral Daily  . metoprolol  25 mg Oral BID  . oxybutynin  10 mg Oral Daily  . pantoprazole  40 mg Oral Daily  . piperacillin-tazobactam (ZOSYN)  IV  3.375 g Intravenous Q8H  . saccharomyces boulardii  250 mg Oral BID  .  vancomycin  750 mg Intravenous Q12H    Continuous Infusions: . sodium chloride 75 mL/hr at 01/07/16 2231     LOS: 2 days   Time spent: 26 minutes  Mir Vergie LivingMohammed Ikramullah, MD [facility] Pager [phone]  If 7PM-7AM, please contact night-coverage [email or web address] Password [email or web address] 01/08/2016, 8:04 AM

## 2016-01-08 NOTE — Progress Notes (Signed)
PT Cancellation Note  Patient Details Name: Clayton [name] MRN: [id number] DOB: 1956/01/05   Cancelled Treatment:     pt sitting EOB Indep eating lunch.  Stated he does not have his prosthesis sleeve with him (left it at home) thus unable to apply his prosthetic leg to perform any OOB activity.  Per chart review, pt declines rec for SNF and plans to return to his [facility]    Armando ReichertKropski, Kevonna Nolte Ann 01/08/2016, 2:10 PM

## 2016-01-09 DIAGNOSIS — E1142 Type 2 diabetes mellitus with diabetic polyneuropathy: Secondary | ICD-10-CM

## 2016-01-09 DIAGNOSIS — N179 Acute kidney failure, unspecified: Secondary | ICD-10-CM

## 2016-01-09 DIAGNOSIS — L03115 Cellulitis of right lower limb: Secondary | ICD-10-CM

## 2016-01-09 DIAGNOSIS — L8990 Pressure ulcer of unspecified site, unspecified stage: Secondary | ICD-10-CM

## 2016-01-09 LAB — CBC
HEMATOCRIT: 30.6 % — AB (ref 39.0–52.0)
HEMOGLOBIN: 10.1 g/dL — AB (ref 13.0–17.0)
MCH: 28.9 pg (ref 26.0–34.0)
MCHC: 33 g/dL (ref 30.0–36.0)
MCV: 87.7 fL (ref 78.0–100.0)
Platelets: 151 10*3/uL (ref 150–400)
RBC: 3.49 MIL/uL — AB (ref 4.22–5.81)
RDW: 18 % — ABNORMAL HIGH (ref 11.5–15.5)
WBC: 8.5 10*3/uL (ref 4.0–10.5)

## 2016-01-09 LAB — BASIC METABOLIC PANEL
ANION GAP: 8 (ref 5–15)
BUN: 22 mg/dL — ABNORMAL HIGH (ref 6–20)
CHLORIDE: 104 mmol/L (ref 101–111)
CO2: 21 mmol/L — AB (ref 22–32)
Calcium: 7.6 mg/dL — ABNORMAL LOW (ref 8.9–10.3)
Creatinine, Ser: 1.37 mg/dL — ABNORMAL HIGH (ref 0.61–1.24)
GFR calc non Af Amer: 54 mL/min — ABNORMAL LOW (ref >60)
Glucose, Bld: 271 mg/dL — ABNORMAL HIGH (ref 65–99)
Potassium: 4 mmol/L (ref 3.5–5.1)
Sodium: 133 mmol/L — ABNORMAL LOW (ref 135–145)

## 2016-01-09 LAB — GLUCOSE, CAPILLARY
GLUCOSE-CAPILLARY: 263 mg/dL — AB (ref 65–99)
GLUCOSE-CAPILLARY: 207 mg/dL — AB (ref 65–99)
Glucose-Capillary: 257 mg/dL — ABNORMAL HIGH (ref 65–99)
Glucose-Capillary: 233 mg/dL — ABNORMAL HIGH (ref 65–99)

## 2016-01-09 LAB — HEMOGLOBIN A1C
HEMOGLOBIN A1C: 10.6 % — AB (ref 4.8–5.6)
Mean Plasma Glucose: 258 mg/dL

## 2016-01-09 NOTE — Progress Notes (Signed)
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 At that time patient also noted to have acute kidney injury. Started on IV antibiotics. Cultures so far no growth to date. By hospital day 2, white count normalized. Patient continued to spike fever until morning of 6/30.  Overnight, low-grade temp of 100. Patient himself otherwise stable. CBGs in the 200s. Today, patient is somewhat somnolent and difficult to awake, but he has no complaints when awoken.  Assessment/Plan: Active Problems:   H/O: stroke   CAD (coronary artery disease)   DM type 2 with diabetic peripheral neuropathy (HCC): Poorly controlled. Continue sliding scale, increase long-term acting insulin   HLD (hyperlipidemia)   GERD (gastroesophageal reflux disease)   Cellulitis of right lower extremity. Given normal lactic acid level, doubtful this is true sepsis. Continue antibiotics and fluids.     AKI (acute kidney injury) (HCC): 5 months prior, normal renal function. Creatinine steadily improving, continue IV fluids   Pressure ulcer   Code Status: Full code   Family Communication: Left message for family   Disposition Plan: If no further fever, anticipate discharge tomorrow as long as renal function is normal    Consultants:  None   Procedures:  None   Antimicrobials: IV vancomycin 6/28-present IV Zosyn 6/28-present  DVT prophylaxis: Lovenox  Objective: Filed Vitals:   01/08/16 2126 01/09/16 0516 01/09/16 0619 01/09/16 1507  BP:  119/63 121/65 108/63  Pulse: 80 91 89 74  Temp:  98.6 F (37 C)  98.4 F (36.9 C)  TempSrc:  Oral  Oral  Resp: 18 20  20   Height:      Weight:      SpO2: 92% 90%  94%    Intake/Output Summary (Last 24 hours) at  01/09/16 1657 Last data filed at 01/09/16 1508  Gross per 24 hour  Intake 1922.5 ml  Output   1775 ml  Net  147.5 ml   Filed Weights   01/06/16 0600 01/06/16 0710 01/06/16 0800  Weight: 111 kg (244 lb 11.4 oz) 113.399 kg (250 lb) 111.177 kg (245 lb 1.6 oz)    Exam:   General:  Somnolent, no acute distress   Cardiovascular: Regular rate and rhythm, S1 and S2   Respiratory: Clear to auscultation bilaterally   Abdomen: Soft, nontender, nondistended, positive bowel sounds   Musculoskeletal: 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   Skin: Right lower extremity with slowly improving cellulitis, chronic venous stasis   Psychiatry: Appropriate, no evidence of psychoses    Data Reviewed: CBC:  Recent Labs Lab 01/06/16 0626 01/06/16 0636 01/07/16 0508 01/08/16 0403 01/09/16 0501  WBC 15.8*  --  8.4 9.3 8.5  NEUTROABS 13.3*  --   --   --   --   HGB 13.8 14.3 10.9* 10.9* 10.1*  HCT 39.1 42.0 32.6* 32.8* 30.6*  MCV 83.5  --  87.6 87.0 87.7  PLT 141*  --  128* 133* 151   Basic Metabolic Panel:  Recent Labs Lab 01/06/16 0626 01/06/16 0636 01/07/16 0508 01/08/16 0403 01/09/16 0501  NA 131* 133* 134* 134* 133*  K 3.8 3.8 3.8 4.0 4.0  CL 94* 93* 102 103 104  CO2 24  --  25 24 21*  GLUCOSE 182* 184* 220*  230* 271*  BUN 38* 34* 28* 20 22*  CREATININE 1.70* 1.50* 1.45* 1.32* 1.37*  CALCIUM 8.2*  --  7.5* 7.6* 7.6*   GFR: Estimated Creatinine Clearance: 67.6 mL/min (by C-G formula based on Cr of 1.37). Liver Function Tests:  Recent Labs Lab 01/06/16 0626  AST 24  ALT 15*  ALKPHOS 80  BILITOT 2.4*  PROT 7.1  ALBUMIN 3.4*   No results for input(s): LIPASE, AMYLASE in the last 168 hours. No results for input(s): AMMONIA in the last 168 hours. Coagulation Profile:  Recent Labs Lab 01/06/16 0809  INR 1.93*   Cardiac Enzymes: No results for input(s): CKTOTAL, CKMB, CKMBINDEX, TROPONINI in the last 168 hours. BNP (last 3 results) No results  for input(s): PROBNP in the last 8760 hours. HbA1C:  Recent Labs  01/08/16 0403  HGBA1C 10.6*   CBG:  Recent Labs Lab 01/08/16 1211 01/08/16 1647 01/08/16 2054 01/09/16 0742 01/09/16 1146  GLUCAP 232* 228* 315* 257* 263*   Lipid Profile: No results for input(s): CHOL, HDL, LDLCALC, TRIG, CHOLHDL, LDLDIRECT in the last 72 hours. Thyroid Function Tests: No results for input(s): TSH, T4TOTAL, FREET4, T3FREE, THYROIDAB in the last 72 hours. Anemia Panel: No results for input(s): VITAMINB12, FOLATE, FERRITIN, TIBC, IRON, RETICCTPCT in the last 72 hours. Urine analysis:    Component Value Date/Time   COLORURINE AMBER* 01/06/2016 0614   APPEARANCEUR CLOUDY* 01/06/2016 0614   LABSPEC 1.020 01/06/2016 0614   PHURINE 5.0 01/06/2016 0614   GLUCOSEU 250* 01/06/2016 0614   HGBUR MODERATE* 01/06/2016 0614   BILIRUBINUR NEGATIVE 01/06/2016 0614   KETONESUR NEGATIVE 01/06/2016 0614   PROTEINUR 100* 01/06/2016 0614   UROBILINOGEN 0.2 03/23/2015 1535   NITRITE NEGATIVE 01/06/2016 0614   LEUKOCYTESUR SMALL* 01/06/2016 0614   Sepsis Labs: (procalcitonin:4,lacticidven:4)  ) Recent Results (from the past 240 hour(s))  Urine culture     Status: None   Collection Time: 01/06/16  6:14 AM  Result Value Ref Range Status   Specimen Description URINE, CATHETERIZED  Final   Special Requests NONE  Final   Culture NO GROWTH Performed at [facility]   Final   Report Status 01/07/2016 FINAL  Final  Blood Culture (routine x 2)     Status: None (Preliminary result)   Collection Time: 01/06/16  6:27 AM  Result Value Ref Range Status   Specimen Description BLOOD LEFT ANTECUBITAL  Final   Special Requests BOTTLES DRAWN AEROBIC AND ANAEROBIC  Final   Culture   Final    NO GROWTH 3 DAYS Performed at [facility]    Report Status PENDING  Incomplete  Blood Culture (routine x 2)     Status: None (Preliminary result)   Collection Time: 01/06/16  6:40 AM  Result  Value Ref Range Status   Specimen Description BLOOD LEFT FOREARM  Final   Special Requests BOTTLES DRAWN AEROBIC AND ANAEROBIC 6CC  Final   Culture   Final    NO GROWTH 3 DAYS Performed at [facility]    Report Status PENDING  Incomplete  MRSA PCR Screening     Status: None   Collection Time: 01/08/16  8:33 AM  Result Value Ref Range Status   MRSA by PCR NEGATIVE NEGATIVE Final    Comment:        The GeneXpert MRSA Assay (FDA approved for NASAL specimens only), is one component of a comprehensive MRSA colonization surveillance program. It is not intended to diagnose MRSA infection nor to guide or  monitor treatment for MRSA infections.       Studies: No results found.  Scheduled Meds: . apixaban  5 mg Oral BID  . aspirin EC  81 mg Oral Daily  . atorvastatin  40 mg Oral q1800  . cholecalciferol  2,000 Units Oral BID  . docusate sodium  100 mg Oral BID  . gabapentin  600 mg Oral QID  . insulin aspart  0-15 Units Subcutaneous TID WC  . insulin aspart  3 Units Subcutaneous TID WC  . insulin detemir  15 Units Subcutaneous QHS  . magnesium oxide  400 mg Oral Daily  . metoprolol  25 mg Oral BID  . oxybutynin  10 mg Oral Daily  . pantoprazole  40 mg Oral Daily  . piperacillin-tazobactam (ZOSYN)  IV  3.375 g Intravenous Q8H  . saccharomyces boulardii  250 mg Oral BID    Continuous Infusions: . sodium chloride 75 mL/hr at 01/09/16 0520     LOS: 3 days   Time spent: 15 minutes  Hollice EspyKRISHNAN,Mackinley Cassaday K, MD [facility] Pager [phone]  If 7PM-7AM, please contact night-coverage [email or web address] Password [email or web address] 01/09/2016, 4:57 PM

## 2016-01-09 NOTE — Progress Notes (Signed)
Pharmacy Antibiotic Note  Clayton [name] is a 60 y.o. male admitted on 01/06/2016 with sepsis/cellulitis.  He is currently on day#4 Zosyn for cellulitis.    01/09/2016:  Afebrile, normal WBC  Renal function relatively stable.  CrCl >5720ml/min.   Plan:   Continue Zosyn 3.375gm IV Q8h to be infused over 4hrs  No further dose adjustments anticipated.  Pharmacy to sign off.    De-escalate antibiotics once clinically appropriate.   Height: 5\' 8"  (172.7 cm) Weight: 245 lb 1.6 oz (111.177 kg) IBW/kg (Calculated) : 68.4  Temp (24hrs), Avg:99.2 F (37.3 C), Min:98.6 F (37 C), Max:100.3 F (37.9 C)   Recent Labs Lab 01/06/16 0626 01/06/16 0636 01/07/16 0508 01/08/16 0403 01/09/16 0501  WBC 15.8*  --  8.4 9.3 8.5  CREATININE 1.70* 1.50* 1.45* 1.32* 1.37*  LATICACIDVEN  --  1.69  --   --   --     Estimated Creatinine Clearance: 67.6 mL/min (by C-G formula based on Cr of 1.37).    Allergies  Allergen Reactions  . Niaspan [Niacin Er] Other (See Comments)    Face was red and hot    Antimicrobials this admission: Vancomycin 01/06/2016 >> 6/30 Zosyn 01/06/2016 >>   Dose adjustments this admission:  Microbiology results: 6/28 BCx: NGTD 6/28 UCx: NGTD 6/30 MRSA PCR: negative  Thank you for allowing pharmacy to be a part of this patient's care.  Clayton [name], PharmD, BCPS Pager: [phone] 01/09/2016 10:26 AM

## 2016-01-09 NOTE — Progress Notes (Signed)
CCMD notified RN pt had 6 beat run of Vtach. Pt was asleep at the time, BP 121/65 HR 89 pt c/o no symptoms. NP made aware. Will continue to monitor pt closely and carry out any new orders.

## 2016-01-10 DIAGNOSIS — E785 Hyperlipidemia, unspecified: Secondary | ICD-10-CM

## 2016-01-10 LAB — BASIC METABOLIC PANEL
ANION GAP: 5 (ref 5–15)
BUN: 21 mg/dL — ABNORMAL HIGH (ref 6–20)
CO2: 23 mmol/L (ref 22–32)
Calcium: 8 mg/dL — ABNORMAL LOW (ref 8.9–10.3)
Chloride: 109 mmol/L (ref 101–111)
Creatinine, Ser: 1.27 mg/dL — ABNORMAL HIGH (ref 0.61–1.24)
GFR calc Af Amer: >60 mL/min (ref >60)
GFR, EST NON AFRICAN AMERICAN: 59 mL/min — AB (ref >60)
Glucose, Bld: 223 mg/dL — ABNORMAL HIGH (ref 65–99)
POTASSIUM: 4.1 mmol/L (ref 3.5–5.1)
SODIUM: 137 mmol/L (ref 135–145)

## 2016-01-10 LAB — GLUCOSE, CAPILLARY
GLUCOSE-CAPILLARY: 225 mg/dL — AB (ref 65–99)
GLUCOSE-CAPILLARY: 215 mg/dL — AB (ref 65–99)
Glucose-Capillary: 221 mg/dL — ABNORMAL HIGH (ref 65–99)

## 2016-01-10 LAB — CBC
HEMATOCRIT: 31.5 % — AB (ref 39.0–52.0)
HEMOGLOBIN: 10.3 g/dL — AB (ref 13.0–17.0)
MCH: 29.2 pg (ref 26.0–34.0)
MCHC: 32.7 g/dL (ref 30.0–36.0)
MCV: 89.2 fL (ref 78.0–100.0)
Platelets: 177 10*3/uL (ref 150–400)
RBC: 3.53 MIL/uL — ABNORMAL LOW (ref 4.22–5.81)
RDW: 18 % — ABNORMAL HIGH (ref 11.5–15.5)
WBC: 7.7 10*3/uL (ref 4.0–10.5)

## 2016-01-10 MED ORDER — INSULIN DETEMIR 100 UNIT/ML ~~LOC~~ SOLN: 15.0000 [IU] | Freq: Every day | SUBCUTANEOUS | Status: AC

## 2016-01-10 MED ORDER — LEVOFLOXACIN 500 MG PO TABS: 500.0000 mg | ORAL_TABLET | Freq: Every day | ORAL | Status: DC

## 2016-01-10 NOTE — Progress Notes (Signed)
Patient voided 250cc after Foley removed. HH services set up. CM notified of discharge. [facility] called for transportation.  Reviewed discharge information with patient. Answered all questions. Patient able to teach back medications and reasons to contact MD/911. Patient verbalizes importance of PCP follow up appointment.  Earnest ConroyBrooke M. Clelia CroftShaw, RN

## 2016-01-10 NOTE — Discharge Summary (Signed)
Discharge Summary  Clayton [name] XBJ:[id number] DOB: [birth year]  PCP: [name], [name], PA-C  Admit date: 01/06/2016 Discharge date: 01/10/2016  Time spent: 25 minutes   Recommendations for Outpatient Follow-up:  1. Patient will follow up with his PCP in the next 2-3 weeks. At that time, recommendation for repeat basic metabolic panel monitoring renal function recommended. 2. New medication: Levemir 15 units subcutaneous daily at bedtime 3. New medication: Levaquin 500 mg by mouth daily 2 days 4. Patient will follow up with [facility] for PT/OT/RN  Discharge Diagnoses:  Active Hospital Problems   Diagnosis Date Noted  . Cellulitis 01/06/2016  . Sepsis (HCC) 01/06/2016  . AKI (acute kidney injury) (HCC) 01/06/2016  . Pressure ulcer 01/06/2016    Class: Stage 1  . H/O: stroke 08/10/2014  . CAD (coronary artery disease) 08/10/2014  . DM type 2 with diabetic peripheral neuropathy (HCC) 08/10/2014  . HLD (hyperlipidemia) 08/10/2014  . GERD (gastroesophageal reflux disease) 08/10/2014    Resolved Hospital Problems   Diagnosis Date Noted Date Resolved  No resolved problems to display.    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 At that time patient also noted to have acute kidney injury. Started on IV antibiotics. Cultures so far no growth to date.  Hospital Course:  Active Problems:   H/O: stroke: Stable    CAD (coronary artery disease): Stable    DM type 2 with diabetic peripheral neuropathy (HCC) uncontrolled: Last December,  patient had episodes of hypoglycemia and was on metformin plus sliding scale only. At that time metformin was discontinued and he was chest continued on sliding scale. His hospitalization, CBGs have been quite elevated. A1c checked and found to be at 10.6 equivalent to average CBG of 258. Patient started on Levemir 15 units subcutaneous daily at bedtime which will be continued after discharge   HLD (hyperlipidemia)   GERD (gastroesophageal reflux disease)   Cellulitis of right lower extremity: Normal lactic acid level on admission. Patient continued on antibiotics and fluids. By 7/2, had been afebrile times greater than 24 hours. Discharged on 2 more days of by mouth Levaquin for a total of 7 day course treatment.      AKI (acute kidney injury) (HCC): 5 months ago, normal renal function. Creatinine on admission at 1.7 with BUN of 38. Patient steadily receiving IV fluids. By day of discharge, BUN down to 21 and creatinine of 1.27. Lasix and ACE inhibitor held. Okay to start upon discharge. 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 If you have any questions about your discharge medications or the care you received while you were in the hospital after you are discharged, you can call  the unit and asked to speak with the hospitalist on call if the hospitalist that took care of you is not available. Once you are discharged, your primary care physician will handle any further medical issues. Please note that NO REFILLS for any discharge medications will be authorized once you are discharged, as it is imperative that you return to your primary care physician (or establish a relationship with a primary care physician if you do not have one) for your aftercare needs so that they can reassess your need for medications and monitor your lab values.  Discharge Instructions    Diet - low sodium heart healthy    Complete by:  As directed      Increase activity slowly    Complete by:  As directed             Medication List    TAKE these medications        albuterol 108 (90 Base) MCG/ACT inhaler  Commonly known as:  PROVENTIL HFA;VENTOLIN HFA  Inhale 1-2 puffs into the lungs every 6 (six) hours as needed for wheezing or shortness of breath.     apixaban 5 MG Tabs tablet  Commonly known as:  ELIQUIS  Take 1 tablet (5 mg total) by mouth 2 (two) times daily.     aspirin EC 81 MG tablet  Take 1 tablet (81 mg total) by mouth daily.     atorvastatin 40 MG tablet  Commonly known as:  LIPITOR  Take 40 mg by mouth daily.     cholecalciferol 1000 units tablet  Commonly known as:  VITAMIN D  Take 2,000 Units by mouth 2 (two) times daily.     furosemide 40 MG tablet  Commonly known as:  LASIX  Take 1 tablet (40 mg total) by mouth daily.     gabapentin 600 MG tablet  Commonly known as:  NEURONTIN  Take 600 mg by mouth 4 (four) times daily.     insulin aspart 100 UNIT/ML injection  Commonly known as:  novoLOG  Inject 0-15 Units into the skin 3 (three) times daily with meals. fsg <120 no insulin  fsg 120-180- 2 units  fsg 180-220- 4 units fsg 220-260- 6 units  fsg 260-300 8 units fsg 300-350- 10 units fsg 350-400 -12 units  fsg >400 , call MD     insulin detemir 100 UNIT/ML  injection  Commonly known as:  LEVEMIR  Inject 0.15 mLs (15 Units total) into the skin at bedtime.     levofloxacin 500 MG tablet  Commonly known as:  LEVAQUIN  Take 1 tablet (500 mg total) by mouth daily.     lisinopril 2.5 MG tablet  Commonly known as:  PRINIVIL,ZESTRIL  Take 2.5 mg by mouth daily.     Magnesium Oxide 420 MG Tabs  Take 1 tablet by mouth daily.     metoprolol 50 MG tablet  Commonly known as:  LOPRESSOR  Take 25 mg by mouth 2 (two) times daily.     omeprazole 20 MG capsule  Commonly known as:  PRILOSEC  Take 20 mg by mouth daily.  oxybutynin 10 MG 24 hr tablet  Commonly known as:  DITROPAN-XL  Take 10 mg by mouth every morning.     oxyCODONE 15 MG immediate release tablet  Commonly known as:  ROXICODONE  Take 15 mg by mouth every 8 (eight) hours as needed for pain.       Allergies  Allergen Reactions  . Niaspan [Niacin Er] Other (See Comments)    Face was red and hot       Follow-up Information    [facility].   Specialty:  Home Health Services   Why:  HHRN/PT/OT/aide/social worker   Contact information:   [address] [place] [id number] [phone]       Follow up with DYER, [name], PA-C. Schedule an appointment as soon as possible for a visit in 2 weeks.   Specialty:  Physician Assistant   Contact information:   [address] [place] [id number] [phone]        The results of significant diagnostics from this hospitalization (including imaging, microbiology, ancillary and laboratory) are listed below for reference.    Significant Diagnostic Studies: [place] Renal  01/07/2016  CLINICAL DATA:  Acute renal insufficiency; history of sepsis and chronic CHF EXAM: RENAL / URINARY TRACT ULTRASOUND COMPLETE COMPARISON:  Renal ultrasound of June 09, 2015 FINDINGS: Right Kidney: Length: 13.9 cm. The renal cortical echotexture is approximately equal to that for slightly greater  than that of the adjacent liver. There is no mass or hydronephrosis. Left Kidney: Length: 13.7 cm. The renal cortical echotexture on the left is similar to that on the right. There is an upper pole simple appearing cyst measuring 1.5 cm in greatest dimension. There is no hydronephrosis. Bladder: The urinary bladder is decompressed by a Foley catheter. IMPRESSION: Mild increase in renal cortical echotexture consistent with medical renal disease. There is no evidence of obstruction. No stones are evident. The urinary bladder was decompressed by a Foley catheter. Electronically Signed   By: David  Swazil[place]Jordan M.D.   On: 01/07/2016 08:45   Dg Chest Port 1 View  01/06/2016  CLINICAL DATA:  Weakness, worsening over the past 2 days.  Sepsis. EXAM: PORTABLE CHEST 1 VIEW COMPARISON:  06/10/2015 FINDINGS: Prior sternotomy and CABG. The lungs are clear. There is no large effusion. The pulmonary vasculature is normal. IMPRESSION: No acute cardiopulmonary findings. Electronically Signed   By: Ellery Plunkaniel R Mitchell M.D.   On: 01/06/2016 06:17    Microbiology: Recent Results (from the past 240 hour(s))  Urine culture     Status: None   Collection Time: 01/06/16  6:14 AM  Result Value Ref Range Status   Specimen Description URINE, CATHETERIZED  Final   Special Requests NONE  Final   Culture NO GROWTH Performed at [facility]   Final   Report Status 01/07/2016 FINAL  Final  Blood Culture (routine x 2)     Status: None (Preliminary result)   Collection Time: 01/06/16  6:27 AM  Result Value Ref Range Status   Specimen Description BLOOD LEFT ANTECUBITAL  Final   Special Requests BOTTLES DRAWN AEROBIC AND ANAEROBIC 5ML  Final   Culture   Final    NO GROWTH 4 DAYS Performed at [facility]    Report Status PENDING  Incomplete  Blood Culture (routine x 2)     Status: None (Preliminary result)   Collection Time: 01/06/16  6:40 AM  Result Value Ref Range Status   Specimen Description BLOOD  LEFT FOREARM   Final   Special Requests BOTTLES DRAWN AEROBIC AND ANAEROBIC 6CC  Final   Culture   Final    NO GROWTH 4 DAYS Performed at [facility]    Report Status PENDING  Incomplete  MRSA PCR Screening     Status: None   Collection Time: 01/08/16  8:33 AM  Result Value Ref Range Status   MRSA by PCR NEGATIVE NEGATIVE Final    Comment:        The GeneXpert MRSA Assay (FDA approved for NASAL specimens only), is one component of a comprehensive MRSA colonization surveillance program. It is not intended to diagnose MRSA infection nor to guide or monitor treatment for MRSA infections.      Labs: Basic Metabolic Panel:  Recent Labs Lab 01/06/16 0626 01/06/16 0636 01/07/16 0508 01/08/16 0403 01/09/16 0501 01/10/16 0503  NA 131* 133* 134* 134* 133* 137  K 3.8 3.8 3.8 4.0 4.0 4.1  CL 94* 93* 102 103 104 109  CO2 24  --  25 24 21* 23  GLUCOSE 182* 184* 220* 230* 271* 223*  BUN 38* 34* 28* 20 22* 21*  CREATININE 1.70* 1.50* 1.45* 1.32* 1.37* 1.27*  CALCIUM 8.2*  --  7.5* 7.6* 7.6* 8.0*   Liver Function Tests:  Recent Labs Lab 01/06/16 0626  AST 24  ALT 15*  ALKPHOS 80  BILITOT 2.4*  PROT 7.1  ALBUMIN 3.4*   No results for input(s): LIPASE, AMYLASE in the last 168 hours. No results for input(s): AMMONIA in the last 168 hours. CBC:  Recent Labs Lab 01/06/16 0626 01/06/16 0636 01/07/16 0508 01/08/16 0403 01/09/16 0501 01/10/16 0503  WBC 15.8*  --  8.4 9.3 8.5 7.7  NEUTROABS 13.3*  --   --   --   --   --   HGB 13.8 14.3 10.9* 10.9* 10.1* 10.3*  HCT 39.1 42.0 32.6* 32.8* 30.6* 31.5*  MCV 83.5  --  87.6 87.0 87.7 89.2  PLT 141*  --  128* 133* 151 177   Cardiac Enzymes: No results for input(s): CKTOTAL, CKMB, CKMBINDEX, TROPONINI in the last 168 hours. BNP: BNP (last 3 results)  Recent Labs  03/23/15 1630 06/01/15 1235 06/08/15 2053  BNP 112.3* 58.5 81.2    ProBNP (last 3 results) No results for input(s): PROBNP in the last 8760  hours.  CBG:  Recent Labs Lab 01/09/16 1146 01/09/16 1652 01/09/16 2121 01/10/16 0719 01/10/16 1223  GLUCAP 263* 207* 233* 221* 225*       Signed:  Hollice Espy, MD [facility] 01/10/2016, 4:33 PM

## 2016-01-11 LAB — CULTURE, BLOOD (ROUTINE X 2)
CULTURE: NO GROWTH
Culture: NO GROWTH

## 2016-02-18 ENCOUNTER — Emergency Department (HOSPITAL_COMMUNITY)
Admission: EM | Admit: 2016-02-18 | Discharge: 2016-02-18 | Disposition: A | Discharge status: Home / Self Care | Payer: Medicare HMO | Attending: Emergency Medicine | Admitting: Emergency Medicine

## 2016-02-18 ENCOUNTER — Encounter (HOSPITAL_COMMUNITY): Payer: Self-pay | Admitting: Emergency Medicine

## 2016-02-18 DIAGNOSIS — Z794 Long term (current) use of insulin: Secondary | ICD-10-CM | POA: Diagnosis not present

## 2016-02-18 DIAGNOSIS — X58XXXA Exposure to other specified factors, initial encounter: Secondary | ICD-10-CM | POA: Insufficient documentation

## 2016-02-18 DIAGNOSIS — I2510 Atherosclerotic heart disease of native coronary artery without angina pectoris: Secondary | ICD-10-CM | POA: Insufficient documentation

## 2016-02-18 DIAGNOSIS — Z87891 Personal history of nicotine dependence: Secondary | ICD-10-CM | POA: Insufficient documentation

## 2016-02-18 DIAGNOSIS — E119 Type 2 diabetes mellitus without complications: Secondary | ICD-10-CM | POA: Insufficient documentation

## 2016-02-18 DIAGNOSIS — I10 Essential (primary) hypertension: Secondary | ICD-10-CM | POA: Diagnosis not present

## 2016-02-18 DIAGNOSIS — Y929 Unspecified place or not applicable: Secondary | ICD-10-CM | POA: Insufficient documentation

## 2016-02-18 DIAGNOSIS — Y999 Unspecified external cause status: Secondary | ICD-10-CM | POA: Insufficient documentation

## 2016-02-18 DIAGNOSIS — Z7982 Long term (current) use of aspirin: Secondary | ICD-10-CM | POA: Insufficient documentation

## 2016-02-18 DIAGNOSIS — Y939 Activity, unspecified: Secondary | ICD-10-CM | POA: Insufficient documentation

## 2016-02-18 DIAGNOSIS — S90821A Blister (nonthermal), right foot, initial encounter: Secondary | ICD-10-CM

## 2016-02-18 DIAGNOSIS — Z79899 Other long term (current) drug therapy: Secondary | ICD-10-CM | POA: Insufficient documentation

## 2016-02-18 MED ORDER — BACITRACIN 500 UNIT/GM EX OINT
TOPICAL_OINTMENT | Freq: Every day | CUTANEOUS | Status: DC
  Filled 2016-02-18: qty 28

## 2016-02-18 MED ORDER — BACITRACIN 500 UNIT/GM EX OINT
1.0000 "application " | TOPICAL_OINTMENT | Freq: Every day | CUTANEOUS | Status: DC
  Administered 2016-02-18: 1 via TOPICAL
  Filled 2016-02-18: qty 14

## 2016-02-18 NOTE — ED Provider Notes (Signed)
[facility]-EMERGENCY DEPT Provider Note   CSN: [id number] Arrival date & time: 02/18/16  1208  First Provider Contact:  First MD Initiated Contact with Patient 02/18/16 1446        History   Chief Complaint No chief complaint on file.   HPI Clayton [name] is a 60 y.o. male.  Patient presents to the emergency department with a complaint of blister formation over the past 2 days to his right lateral foot.  He has had a transmetatarsal amputation on his right foot several years ago.  He is a diabetic with a history of diabetic neuropathy.  He reports no new shoes but reports that the blister ruptured 2 days ago and has had serous drainage from the blister site since then.  He was sent to the emergency department from his independent living facility after evaluation by his nurse today.  He denies fevers or chills.  Denies spreading redness.  Denies foul-smelling drainage.  No other complaints.   The history is provided by the patient and medical records.      Past Medical History:  Diagnosis Date  . Coronary artery disease   . CVA (cerebral infarction) 2007  . Diabetes (HCC)   . Diabetes mellitus without complication (HCC)   . Diabetic neuropathy (HCC)   . Diabetic retinopathy (HCC)   . GERD (gastroesophageal reflux disease)   . Hyperlipidemia   . Hypertension   . MI (myocardial infarction) (HCC)    x2  . PNA (pneumonia)   . Stroke [facility])     Patient Active Problem List   Diagnosis Date Noted  . Cellulitis 01/06/2016  . Sepsis (HCC) 01/06/2016  . AKI (acute kidney injury) (HCC) 01/06/2016  . Pressure ulcer 01/06/2016    Class: Stage 1  . Hx of BKA 08/04/2015  . Atrial flutter (HCC) 06/13/2015  . Chronic diastolic congestive heart failure (HCC) 06/09/2015  . H/O: stroke 08/10/2014  . CAD (coronary artery disease) 08/10/2014  . DM type 2 with diabetic peripheral neuropathy (HCC) 08/10/2014  . HLD (hyperlipidemia) 08/10/2014  . GERD (gastroesophageal reflux disease)  08/10/2014    Past Surgical History:  Procedure Laterality Date  . APPENDECTOMY    . BELOW KNEE LEG AMPUTATION Left   . ELECTROPHYSIOLOGIC STUDY N/A 08/03/2015   Procedure: A-Flutter Ablation;  Surgeon: Marinus Maw, MD;  [facility] CV LAB;  Service: Cardiovascular;  Laterality: N/A;  . PERCUTANEOUS PLACEMENT INTRAVASCULAR STENT CERVICAL CAROTID ARTERY Right        Home Medications    Prior to Admission medications   Medication Sig Start Date End Date Taking? Authorizing Provider  albuterol (PROVENTIL HFA;VENTOLIN HFA) 108 (90 BASE) MCG/ACT inhaler Inhale 1-2 puffs into the lungs every 6 (six) hours as needed for wheezing or shortness of breath.   Yes Historical Provider, MD  apixaban (ELIQUIS) 5 MG TABS tablet Take 1 tablet (5 mg total) by mouth 2 (two) times daily. 06/05/15  Yes Kathlen Mody, MD  aspirin EC 81 MG tablet Take 1 tablet (81 mg total) by mouth daily. 06/05/15  Yes Kathlen Mody, MD  atorvastatin (LIPITOR) 40 MG tablet Take 40 mg by mouth daily.   Yes Historical Provider, MD  cholecalciferol (VITAMIN D) 1000 UNITS tablet Take 1,000 Units by mouth 2 (two) times daily.    Yes Historical Provider, MD  furosemide (LASIX) 40 MG tablet Take 1 tablet (40 mg total) by mouth daily. 06/05/15  Yes Kathlen Mody, MD  gabapentin (NEURONTIN) 300 MG capsule Take 600 mg by  mouth 4 (four) times daily.   Yes Historical Provider, MD  insulin aspart (NOVOLOG) 100 UNIT/ML injection Inject 0-15 Units into the skin 3 (three) times daily with meals as needed for high blood sugar. Pt uses as needed per sliding scale:   Less than 120:  0 units 120-180:  2 units 181-220:4 units 221-260:  6 units 261-300:  8 units 301-350:  10 units 351-400:  12 units Greater than 400:  Call MD   Yes Historical Provider, MD  insulin detemir (LEVEMIR) 100 UNIT/ML injection Inject 0.15 mLs (15 Units total) into the skin at bedtime. 01/10/16  Yes Hollice EspySendil K Krishnan, MD  lisinopril (PRINIVIL,ZESTRIL) 2.5 MG  tablet Take 2.5 mg by mouth daily.   Yes Historical Provider, MD  magnesium oxide (MAG-OX) 400 (241.3 Mg) MG tablet Take 400 mg by mouth daily.   Yes Historical Provider, MD  metoprolol tartrate (LOPRESSOR) 25 MG tablet Take 25 mg by mouth 2 (two) times daily.   Yes Historical Provider, MD  omeprazole (PRILOSEC) 20 MG capsule Take 20 mg by mouth daily.   Yes Historical Provider, MD  oxybutynin (DITROPAN-XL) 10 MG 24 hr tablet Take 10 mg by mouth daily.    Yes Historical Provider, MD  oxyCODONE (ROXICODONE) 15 MG immediate release tablet Take 15 mg by mouth every 8 (eight) hours as needed for pain.   Yes Historical Provider, MD    Family History Family History  Problem Relation Age of Onset  . Heart disease Father   . Diabetes Father   . Diabetes Sister     Social History Social History  Substance Use Topics  . Smoking status: Former Games developermoker  . Smokeless tobacco: Never Used  . Alcohol use No     Allergies   Niaspan [niacin er]   Review of Systems Review of Systems  All other systems reviewed and are negative.    Physical Exam Updated Vital Signs BP 177/58 (BP Location: Right Arm)   Pulse 83   Temp 98.1 F (36.7 C) (Oral)   Resp 20   SpO2 94%   Physical Exam  Constitutional: He is oriented to person, place, and time. He appears well-developed and well-nourished.  HENT:  Head: Normocephalic.  Eyes: EOM are normal.  Neck: Normal range of motion.  Pulmonary/Chest: Effort normal.  Abdominal: He exhibits no distension.  Musculoskeletal:  Left AKA.  Right foot with well-healed transmetatarsal amputation.  Large ruptured blister involving the epidermis of the right lateral aspect of the foot.  There is no spreading erythema from the right foot.  There is no warmth to the right foot.  Right foot is perfused.  There is no foul-smelling drainage.  No active serous drainage on my evaluation.  Neurological: He is alert and oriented to person, place, and time.  Psychiatric: He  has a normal mood and affect.  Nursing note and vitals reviewed.    ED Treatments / Results  Labs (all labs ordered are listed, but only abnormal results are displayed) Labs Reviewed - No data to display  EKG  EKG Interpretation None       Radiology No results found.  Procedures Procedures (including critical care time)  Medications Ordered in ED Medications  bacitracin ointment (not administered)     Initial Impression / Assessment and Plan / ED Course  I have reviewed the triage vital signs and the nursing notes.  Pertinent labs & imaging results that were available during my care of the patient were reviewed by me and considered  in my medical decision making (see chart for details).  Clinical Course    Blister was debris did.  The underlying tissue is pink and healthy.  There is no signs of infection of this blistered area or the right foot.  There is no indication for antibiotics.  He is a high risk for severe infection in his right foot if this wound does not heal well.  I've spoke with the patient at length in regards to this.  He understands that he will need follow-up with his primary care physician and likely referral to a wound care specialist as this will take some time to heal.  In the meantime I've given him standard infection warnings and asked that he return to the ER if he develops spreading redness or fever.  All questions answered  Nursing staff at this time will decline antibacterial ointment and dress the wound.  I recommended twice a day dressing changes with twice a day application of a antibacterial topical ointment.  This will also allow for proper recurrent evaluation of the wound for signs of infection  Final Clinical Impressions(s) / ED Diagnoses   Final diagnoses:  Blister of right foot, initial encounter    New Prescriptions Current Discharge Medication List       Azalia Bilis, MD 02/18/16 1511

## 2016-02-18 NOTE — ED Triage Notes (Addendum)
[facility] for intermittent right foot pain. Left AKA and all toes amputated on right foot.   Last VS: 126/94, 100hr, 16resp, 98% 2L.

## 2016-02-18 NOTE — Discharge Instructions (Signed)
Please speak with your primary care physician about setting up an appointment with the wound care specialist so that this wound heals appropriately.  Please apply an antibacterial ointment twice a day and please change dressings twice a day.  Please return to the emergency department for any new or worsening symptoms.

## 2016-02-18 NOTE — ED Notes (Addendum)
Patient reports nurse at facility had him sent over for infection to right foot. Patient states wound has had yellow drainage and intermittent sharp pain x2 days. Denies fever, N/V. Dressing applied before arrival to ED, clean and dry.

## 2016-04-21 ENCOUNTER — Emergency Department (HOSPITAL_COMMUNITY): Payer: Medicare HMO

## 2016-04-21 ENCOUNTER — Encounter (HOSPITAL_COMMUNITY): Payer: Self-pay

## 2016-04-21 ENCOUNTER — Inpatient Hospital Stay (HOSPITAL_COMMUNITY)
Admission: EM | Admit: 2016-04-21 | Discharge: 2016-05-11 | DRG: 870 | Disposition: E | Payer: Medicare HMO | Attending: Internal Medicine | Admitting: Internal Medicine

## 2016-04-21 DIAGNOSIS — R6521 Severe sepsis with septic shock: Secondary | ICD-10-CM | POA: Diagnosis present

## 2016-04-21 DIAGNOSIS — L97509 Non-pressure chronic ulcer of other part of unspecified foot with unspecified severity: Secondary | ICD-10-CM

## 2016-04-21 DIAGNOSIS — R740 Nonspecific elevation of levels of transaminase and lactic acid dehydrogenase [LDH]: Secondary | ICD-10-CM | POA: Diagnosis present

## 2016-04-21 DIAGNOSIS — G9340 Encephalopathy, unspecified: Secondary | ICD-10-CM

## 2016-04-21 DIAGNOSIS — E13621 Other specified diabetes mellitus with foot ulcer: Secondary | ICD-10-CM

## 2016-04-21 DIAGNOSIS — R159 Full incontinence of feces: Secondary | ICD-10-CM | POA: Diagnosis not present

## 2016-04-21 DIAGNOSIS — I472 Ventricular tachycardia: Secondary | ICD-10-CM | POA: Diagnosis present

## 2016-04-21 DIAGNOSIS — N179 Acute kidney failure, unspecified: Secondary | ICD-10-CM | POA: Diagnosis present

## 2016-04-21 DIAGNOSIS — N390 Urinary tract infection, site not specified: Secondary | ICD-10-CM | POA: Diagnosis present

## 2016-04-21 DIAGNOSIS — I252 Old myocardial infarction: Secondary | ICD-10-CM

## 2016-04-21 DIAGNOSIS — Y95 Nosocomial condition: Secondary | ICD-10-CM | POA: Diagnosis present

## 2016-04-21 DIAGNOSIS — I350 Nonrheumatic aortic (valve) stenosis: Secondary | ICD-10-CM | POA: Diagnosis present

## 2016-04-21 DIAGNOSIS — D72829 Elevated white blood cell count, unspecified: Secondary | ICD-10-CM | POA: Diagnosis present

## 2016-04-21 DIAGNOSIS — N3000 Acute cystitis without hematuria: Secondary | ICD-10-CM | POA: Diagnosis not present

## 2016-04-21 DIAGNOSIS — R509 Fever, unspecified: Secondary | ICD-10-CM | POA: Diagnosis not present

## 2016-04-21 DIAGNOSIS — J9690 Respiratory failure, unspecified, unspecified whether with hypoxia or hypercapnia: Secondary | ICD-10-CM

## 2016-04-21 DIAGNOSIS — I248 Other forms of acute ischemic heart disease: Secondary | ICD-10-CM | POA: Diagnosis present

## 2016-04-21 DIAGNOSIS — I471 Supraventricular tachycardia: Secondary | ICD-10-CM | POA: Diagnosis present

## 2016-04-21 DIAGNOSIS — Z66 Do not resuscitate: Secondary | ICD-10-CM | POA: Diagnosis not present

## 2016-04-21 DIAGNOSIS — L97514 Non-pressure chronic ulcer of other part of right foot with necrosis of bone: Secondary | ICD-10-CM

## 2016-04-21 DIAGNOSIS — Z87891 Personal history of nicotine dependence: Secondary | ICD-10-CM

## 2016-04-21 DIAGNOSIS — R7881 Bacteremia: Secondary | ICD-10-CM | POA: Diagnosis not present

## 2016-04-21 DIAGNOSIS — B952 Enterococcus as the cause of diseases classified elsewhere: Secondary | ICD-10-CM | POA: Diagnosis present

## 2016-04-21 DIAGNOSIS — I5032 Chronic diastolic (congestive) heart failure: Secondary | ICD-10-CM | POA: Diagnosis present

## 2016-04-21 DIAGNOSIS — L97519 Non-pressure chronic ulcer of other part of right foot with unspecified severity: Secondary | ICD-10-CM | POA: Diagnosis not present

## 2016-04-21 DIAGNOSIS — R4182 Altered mental status, unspecified: Secondary | ICD-10-CM | POA: Diagnosis present

## 2016-04-21 DIAGNOSIS — E11319 Type 2 diabetes mellitus with unspecified diabetic retinopathy without macular edema: Secondary | ICD-10-CM | POA: Diagnosis present

## 2016-04-21 DIAGNOSIS — E6609 Other obesity due to excess calories: Secondary | ICD-10-CM | POA: Diagnosis present

## 2016-04-21 DIAGNOSIS — J9600 Acute respiratory failure, unspecified whether with hypoxia or hypercapnia: Secondary | ICD-10-CM | POA: Diagnosis not present

## 2016-04-21 DIAGNOSIS — J690 Pneumonitis due to inhalation of food and vomit: Secondary | ICD-10-CM | POA: Diagnosis present

## 2016-04-21 DIAGNOSIS — E874 Mixed disorder of acid-base balance: Secondary | ICD-10-CM | POA: Diagnosis present

## 2016-04-21 DIAGNOSIS — Z515 Encounter for palliative care: Secondary | ICD-10-CM | POA: Diagnosis not present

## 2016-04-21 DIAGNOSIS — G92 Toxic encephalopathy: Secondary | ICD-10-CM | POA: Diagnosis present

## 2016-04-21 DIAGNOSIS — E875 Hyperkalemia: Secondary | ICD-10-CM | POA: Diagnosis present

## 2016-04-21 DIAGNOSIS — R1011 Right upper quadrant pain: Secondary | ICD-10-CM

## 2016-04-21 DIAGNOSIS — Z794 Long term (current) use of insulin: Secondary | ICD-10-CM

## 2016-04-21 DIAGNOSIS — Z7982 Long term (current) use of aspirin: Secondary | ICD-10-CM

## 2016-04-21 DIAGNOSIS — Z9289 Personal history of other medical treatment: Secondary | ICD-10-CM

## 2016-04-21 DIAGNOSIS — K8020 Calculus of gallbladder without cholecystitis without obstruction: Secondary | ICD-10-CM | POA: Diagnosis present

## 2016-04-21 DIAGNOSIS — E870 Hyperosmolality and hypernatremia: Secondary | ICD-10-CM | POA: Diagnosis not present

## 2016-04-21 DIAGNOSIS — D649 Anemia, unspecified: Secondary | ICD-10-CM | POA: Diagnosis not present

## 2016-04-21 DIAGNOSIS — L89150 Pressure ulcer of sacral region, unstageable: Secondary | ICD-10-CM | POA: Diagnosis present

## 2016-04-21 DIAGNOSIS — J9601 Acute respiratory failure with hypoxia: Secondary | ICD-10-CM

## 2016-04-21 DIAGNOSIS — A419 Sepsis, unspecified organism: Principal | ICD-10-CM | POA: Diagnosis present

## 2016-04-21 DIAGNOSIS — I110 Hypertensive heart disease with heart failure: Secondary | ICD-10-CM | POA: Diagnosis present

## 2016-04-21 DIAGNOSIS — E8770 Fluid overload, unspecified: Secondary | ICD-10-CM | POA: Diagnosis not present

## 2016-04-21 DIAGNOSIS — Z7901 Long term (current) use of anticoagulants: Secondary | ICD-10-CM

## 2016-04-21 DIAGNOSIS — E785 Hyperlipidemia, unspecified: Secondary | ICD-10-CM | POA: Diagnosis present

## 2016-04-21 DIAGNOSIS — B957 Other staphylococcus as the cause of diseases classified elsewhere: Secondary | ICD-10-CM

## 2016-04-21 DIAGNOSIS — Z931 Gastrostomy status: Secondary | ICD-10-CM

## 2016-04-21 DIAGNOSIS — I2510 Atherosclerotic heart disease of native coronary artery without angina pectoris: Secondary | ICD-10-CM | POA: Diagnosis present

## 2016-04-21 DIAGNOSIS — J15212 Pneumonia due to Methicillin resistant Staphylococcus aureus: Secondary | ICD-10-CM | POA: Diagnosis present

## 2016-04-21 DIAGNOSIS — E1140 Type 2 diabetes mellitus with diabetic neuropathy, unspecified: Secondary | ICD-10-CM | POA: Diagnosis present

## 2016-04-21 DIAGNOSIS — E876 Hypokalemia: Secondary | ICD-10-CM | POA: Diagnosis not present

## 2016-04-21 DIAGNOSIS — D638 Anemia in other chronic diseases classified elsewhere: Secondary | ICD-10-CM | POA: Diagnosis present

## 2016-04-21 DIAGNOSIS — Z978 Presence of other specified devices: Secondary | ICD-10-CM

## 2016-04-21 DIAGNOSIS — E1165 Type 2 diabetes mellitus with hyperglycemia: Secondary | ICD-10-CM | POA: Diagnosis present

## 2016-04-21 DIAGNOSIS — Z89511 Acquired absence of right leg below knee: Secondary | ICD-10-CM | POA: Diagnosis not present

## 2016-04-21 DIAGNOSIS — Z6829 Body mass index (BMI) 29.0-29.9, adult: Secondary | ICD-10-CM

## 2016-04-21 DIAGNOSIS — Z79899 Other long term (current) drug therapy: Secondary | ICD-10-CM

## 2016-04-21 DIAGNOSIS — E11621 Type 2 diabetes mellitus with foot ulcer: Secondary | ICD-10-CM | POA: Diagnosis present

## 2016-04-21 DIAGNOSIS — A4902 Methicillin resistant Staphylococcus aureus infection, unspecified site: Secondary | ICD-10-CM

## 2016-04-21 DIAGNOSIS — K219 Gastro-esophageal reflux disease without esophagitis: Secondary | ICD-10-CM | POA: Diagnosis present

## 2016-04-21 DIAGNOSIS — E861 Hypovolemia: Secondary | ICD-10-CM | POA: Diagnosis not present

## 2016-04-21 DIAGNOSIS — R7401 Elevation of levels of liver transaminase levels: Secondary | ICD-10-CM

## 2016-04-21 DIAGNOSIS — I4892 Unspecified atrial flutter: Secondary | ICD-10-CM | POA: Diagnosis present

## 2016-04-21 DIAGNOSIS — I482 Chronic atrial fibrillation: Secondary | ICD-10-CM | POA: Diagnosis present

## 2016-04-21 DIAGNOSIS — R791 Abnormal coagulation profile: Secondary | ICD-10-CM | POA: Diagnosis not present

## 2016-04-21 DIAGNOSIS — Z955 Presence of coronary angioplasty implant and graft: Secondary | ICD-10-CM

## 2016-04-21 DIAGNOSIS — R739 Hyperglycemia, unspecified: Secondary | ICD-10-CM | POA: Diagnosis not present

## 2016-04-21 DIAGNOSIS — J209 Acute bronchitis, unspecified: Secondary | ICD-10-CM | POA: Diagnosis present

## 2016-04-21 DIAGNOSIS — Z8673 Personal history of transient ischemic attack (TIA), and cerebral infarction without residual deficits: Secondary | ICD-10-CM

## 2016-04-21 HISTORY — DX: Heart failure, unspecified: I50.9

## 2016-04-21 LAB — URINALYSIS, ROUTINE W REFLEX MICROSCOPIC
Glucose, UA: 250 mg/dL — AB
Ketones, ur: 15 mg/dL — AB
Nitrite: NEGATIVE
PH: 5 (ref 5.0–8.0)
Protein, ur: 100 mg/dL — AB
SPECIFIC GRAVITY, URINE: 1.026 (ref 1.005–1.030)

## 2016-04-21 LAB — COMPREHENSIVE METABOLIC PANEL
ALBUMIN: 3.5 g/dL (ref 3.5–5.0)
ALK PHOS: 91 U/L (ref 38–126)
ALT: 40 U/L (ref 17–63)
ALT: 34 U/L (ref 17–63)
ANION GAP: 17 — AB (ref 5–15)
ANION GAP: 15 (ref 5–15)
AST: 32 U/L (ref 15–41)
AST: 30 U/L (ref 15–41)
Albumin: 2.9 g/dL — ABNORMAL LOW (ref 3.5–5.0)
Alkaline Phosphatase: 83 U/L (ref 38–126)
BUN: 112 mg/dL — ABNORMAL HIGH (ref 6–20)
BUN: 105 mg/dL — ABNORMAL HIGH (ref 6–20)
CALCIUM: 8 mg/dL — AB (ref 8.9–10.3)
CALCIUM: 7.4 mg/dL — AB (ref 8.9–10.3)
CHLORIDE: 95 mmol/L — AB (ref 101–111)
CHLORIDE: 100 mmol/L — AB (ref 101–111)
CO2: 23 mmol/L (ref 22–32)
CO2: 24 mmol/L (ref 22–32)
CREATININE: 5.67 mg/dL — AB (ref 0.61–1.24)
Creatinine, Ser: 5.81 mg/dL — ABNORMAL HIGH (ref 0.61–1.24)
GFR calc Af Amer: 11 mL/min — ABNORMAL LOW (ref >60)
GFR calc non Af Amer: 9 mL/min — ABNORMAL LOW (ref >60)
GFR, EST AFRICAN AMERICAN: 11 mL/min — AB (ref >60)
GFR, EST NON AFRICAN AMERICAN: 10 mL/min — AB (ref >60)
GLUCOSE: 500 mg/dL — AB (ref 65–99)
Glucose, Bld: 474 mg/dL — ABNORMAL HIGH (ref 65–99)
Potassium: 7.1 mmol/L (ref 3.5–5.1)
Potassium: 5 mmol/L (ref 3.5–5.1)
SODIUM: 135 mmol/L (ref 135–145)
SODIUM: 139 mmol/L (ref 135–145)
Total Bilirubin: 1.1 mg/dL (ref 0.3–1.2)
Total Bilirubin: 1.6 mg/dL — ABNORMAL HIGH (ref 0.3–1.2)
Total Protein: 8 g/dL (ref 6.5–8.1)
Total Protein: 6.9 g/dL (ref 6.5–8.1)

## 2016-04-21 LAB — BASIC METABOLIC PANEL
ANION GAP: 15 (ref 5–15)
Anion gap: 15 (ref 5–15)
Anion gap: 15 (ref 5–15)
BUN: 85 mg/dL — AB (ref 6–20)
BUN: 96 mg/dL — AB (ref 6–20)
BUN: 93 mg/dL — ABNORMAL HIGH (ref 6–20)
CALCIUM: 6.1 mg/dL — AB (ref 8.9–10.3)
CALCIUM: 7.6 mg/dL — AB (ref 8.9–10.3)
CALCIUM: 8.1 mg/dL — AB (ref 8.9–10.3)
CO2: 16 mmol/L — ABNORMAL LOW (ref 22–32)
CO2: 22 mmol/L (ref 22–32)
CO2: 24 mmol/L (ref 22–32)
CREATININE: 4.42 mg/dL — AB (ref 0.61–1.24)
Chloride: 102 mmol/L (ref 101–111)
Chloride: 101 mmol/L (ref 101–111)
Chloride: 102 mmol/L (ref 101–111)
Creatinine, Ser: 4.09 mg/dL — ABNORMAL HIGH (ref 0.61–1.24)
Creatinine, Ser: 3.65 mg/dL — ABNORMAL HIGH (ref 0.61–1.24)
GFR calc Af Amer: 17 mL/min — ABNORMAL LOW (ref >60)
GFR calc non Af Amer: 13 mL/min — ABNORMAL LOW (ref >60)
GFR, EST AFRICAN AMERICAN: 15 mL/min — AB (ref >60)
GFR, EST AFRICAN AMERICAN: 19 mL/min — AB (ref >60)
GFR, EST NON AFRICAN AMERICAN: 14 mL/min — AB (ref >60)
GFR, EST NON AFRICAN AMERICAN: 16 mL/min — AB (ref >60)
GLUCOSE: 700 mg/dL — AB (ref 65–99)
GLUCOSE: 503 mg/dL — AB (ref 65–99)
Glucose, Bld: 718 mg/dL (ref 65–99)
Potassium: 4.8 mmol/L (ref 3.5–5.1)
Potassium: 6 mmol/L — ABNORMAL HIGH (ref 3.5–5.1)
Potassium: 4.2 mmol/L (ref 3.5–5.1)
SODIUM: 133 mmol/L — AB (ref 135–145)
SODIUM: 138 mmol/L (ref 135–145)
SODIUM: 141 mmol/L (ref 135–145)

## 2016-04-21 LAB — I-STAT ARTERIAL BLOOD GAS, ED
Acid-base deficit: 3 mmol/L — ABNORMAL HIGH (ref 0.0–2.0)
Bicarbonate: 24.1 mmol/L (ref 20.0–28.0)
O2 Saturation: 100 %
PCO2 ART: 52.6 mmHg — AB (ref 32.0–48.0)
PH ART: 7.27 — AB (ref 7.350–7.450)
PO2 ART: 251 mmHg — AB (ref 83.0–108.0)
TCO2: 26 mmol/L (ref 0–100)

## 2016-04-21 LAB — CBC WITH DIFFERENTIAL/PLATELET
BASOS PCT: 0 %
Basophils Absolute: 0 10*3/uL (ref 0.0–0.1)
EOS PCT: 0 %
Eosinophils Absolute: 0 10*3/uL (ref 0.0–0.7)
HEMATOCRIT: 39 % (ref 39.0–52.0)
HEMOGLOBIN: 12.2 g/dL — AB (ref 13.0–17.0)
LYMPHS PCT: 7 %
Lymphs Abs: 2.5 10*3/uL (ref 0.7–4.0)
MCH: 27.7 pg (ref 26.0–34.0)
MCHC: 31.3 g/dL (ref 30.0–36.0)
MCV: 88.6 fL (ref 78.0–100.0)
MONOS PCT: 4 %
Monocytes Absolute: 1.4 10*3/uL — ABNORMAL HIGH (ref 0.1–1.0)
NEUTROS ABS: 32.3 10*3/uL — AB (ref 1.7–7.7)
Neutrophils Relative %: 89 %
Platelets: 425 10*3/uL — ABNORMAL HIGH (ref 150–400)
RBC: 4.4 MIL/uL (ref 4.22–5.81)
RDW: 20 % — ABNORMAL HIGH (ref 11.5–15.5)
WBC: 36.2 10*3/uL — ABNORMAL HIGH (ref 4.0–10.5)

## 2016-04-21 LAB — GLUCOSE, CAPILLARY
GLUCOSE-CAPILLARY: 368 mg/dL — AB (ref 65–99)
GLUCOSE-CAPILLARY: 422 mg/dL — AB (ref 65–99)
GLUCOSE-CAPILLARY: 487 mg/dL — AB (ref 65–99)
GLUCOSE-CAPILLARY: 525 mg/dL — AB (ref 65–99)
GLUCOSE-CAPILLARY: 545 mg/dL — AB (ref 65–99)
GLUCOSE-CAPILLARY: 579 mg/dL — AB (ref 65–99)
Glucose-Capillary: >600 mg/dL (ref 65–99)
Glucose-Capillary: >600 mg/dL (ref 65–99)
Glucose-Capillary: 580 mg/dL (ref 65–99)
Glucose-Capillary: 453 mg/dL — ABNORMAL HIGH (ref 65–99)

## 2016-04-21 LAB — I-STAT CHEM 8, ED
BUN: 131 mg/dL — AB (ref 6–20)
CALCIUM ION: 0.93 mmol/L — AB (ref 1.15–1.40)
CHLORIDE: 98 mmol/L — AB (ref 101–111)
Creatinine, Ser: 5.6 mg/dL — ABNORMAL HIGH (ref 0.61–1.24)
GLUCOSE: 485 mg/dL — AB (ref 65–99)
HCT: 40 % (ref 39.0–52.0)
Hemoglobin: 13.6 g/dL (ref 13.0–17.0)
POTASSIUM: 6.9 mmol/L — AB (ref 3.5–5.1)
Sodium: 136 mmol/L (ref 135–145)
TCO2: 27 mmol/L (ref 0–100)

## 2016-04-21 LAB — CBC
HCT: 36.3 % — ABNORMAL LOW (ref 39.0–52.0)
Hemoglobin: 11.3 g/dL — ABNORMAL LOW (ref 13.0–17.0)
MCH: 27.8 pg (ref 26.0–34.0)
MCHC: 31.1 g/dL (ref 30.0–36.0)
MCV: 89.2 fL (ref 78.0–100.0)
PLATELETS: 422 10*3/uL — AB (ref 150–400)
RBC: 4.07 MIL/uL — AB (ref 4.22–5.81)
RDW: 20 % — ABNORMAL HIGH (ref 11.5–15.5)
WBC: 34.2 10*3/uL — ABNORMAL HIGH (ref 4.0–10.5)

## 2016-04-21 LAB — PROTIME-INR
INR: 1.76
PROTHROMBIN TIME: 20.7 s — AB (ref 11.4–15.2)

## 2016-04-21 LAB — HEPARIN LEVEL (UNFRACTIONATED): HEPARIN UNFRACTIONATED: 0.41 [IU]/mL (ref 0.30–0.70)

## 2016-04-21 LAB — I-STAT CG4 LACTIC ACID, ED: Lactic Acid, Venous: 3.62 mmol/L (ref 0.5–1.9)

## 2016-04-21 LAB — TYPE AND SCREEN
ABO/RH(D): O POS
ANTIBODY SCREEN: NEGATIVE

## 2016-04-21 LAB — TROPONIN I
TROPONIN I: 0.06 ng/mL — AB (ref <0.03)
TROPONIN I: 0.07 ng/mL — AB (ref <0.03)
Troponin I: 0.06 ng/mL (ref <0.03)

## 2016-04-21 LAB — CORTISOL: CORTISOL PLASMA: 89.3 ug/dL

## 2016-04-21 LAB — MAGNESIUM: Magnesium: 2.7 mg/dL — ABNORMAL HIGH (ref 1.7–2.4)

## 2016-04-21 LAB — MRSA PCR SCREENING: MRSA BY PCR: NEGATIVE

## 2016-04-21 LAB — LACTIC ACID, PLASMA
LACTIC ACID, VENOUS: 4 mmol/L — AB (ref 0.5–1.9)
LACTIC ACID, VENOUS: 3.7 mmol/L — AB (ref 0.5–1.9)
LACTIC ACID, VENOUS: 5.2 mmol/L — AB (ref 0.5–1.9)
LACTIC ACID, VENOUS: 4.9 mmol/L — AB (ref 0.5–1.9)

## 2016-04-21 LAB — URINE MICROSCOPIC-ADD ON

## 2016-04-21 LAB — PROCALCITONIN: Procalcitonin: 1.97 ng/mL

## 2016-04-21 LAB — PHOSPHORUS: Phosphorus: 5.8 mg/dL — ABNORMAL HIGH (ref 2.5–4.6)

## 2016-04-21 LAB — APTT: aPTT: 34 seconds (ref 24–36)

## 2016-04-21 LAB — TRIGLYCERIDES: Triglycerides: 354 mg/dL — ABNORMAL HIGH (ref <150)

## 2016-04-21 LAB — I-STAT TROPONIN, ED: Troponin i, poc: 0 ng/mL (ref 0.00–0.08)

## 2016-04-21 MED ORDER — ACETAMINOPHEN 650 MG RE SUPP
650.0000 mg | RECTAL | Status: DC | PRN
  Administered 2016-04-21 (×2): 650 mg via RECTAL
  Filled 2016-04-21 (×2): qty 1

## 2016-04-21 MED ORDER — DEXTROSE 5 % IV SOLN
0.0000 ug/min | INTRAVENOUS | Status: DC
  Administered 2016-04-21: 10 ug/min via INTRAVENOUS
  Administered 2016-04-22: 7 ug/min via INTRAVENOUS
  Administered 2016-04-22: 10 ug/min via INTRAVENOUS
  Administered 2016-04-23: 4 ug/min via INTRAVENOUS
  Filled 2016-04-21 (×4): qty 4

## 2016-04-21 MED ORDER — SODIUM BICARBONATE 8.4 % IV SOLN
INTRAVENOUS | Status: AC
  Filled 2016-04-21: qty 50

## 2016-04-21 MED ORDER — HEPARIN (PORCINE) IN NACL 100-0.45 UNIT/ML-% IJ SOLN
1700.0000 [IU]/h | INTRAMUSCULAR | Status: DC
  Administered 2016-04-21 – 2016-04-22 (×3): 1400 [IU]/h via INTRAVENOUS
  Administered 2016-04-23: 1700 [IU]/h via INTRAVENOUS
  Filled 2016-04-21 (×6): qty 250

## 2016-04-21 MED ORDER — MIDAZOLAM HCL 2 MG/2ML IJ SOLN
1.0000 mg | INTRAMUSCULAR | Status: DC | PRN
  Administered 2016-04-21 – 2016-04-22 (×6): 2 mg via INTRAVENOUS
  Filled 2016-04-21 (×6): qty 2

## 2016-04-21 MED ORDER — VITAL HIGH PROTEIN PO LIQD
1000.0000 mL | ORAL | Status: DC
  Administered 2016-04-21 (×3)
  Administered 2016-04-21: 1000 mL
  Administered 2016-04-22 (×2)
  Administered 2016-04-22: 1000 mL
  Administered 2016-04-22 – 2016-04-23 (×5)
  Administered 2016-04-23: 1000 mL
  Administered 2016-04-23
  Administered 2016-04-24 – 2016-04-25 (×2): 1000 mL

## 2016-04-21 MED ORDER — VANCOMYCIN HCL 10 G IV SOLR
2000.0000 mg | Freq: Once | INTRAVENOUS | Status: AC
  Administered 2016-04-21: 2000 mg via INTRAVENOUS
  Filled 2016-04-21: qty 2000

## 2016-04-21 MED ORDER — SODIUM CHLORIDE 0.9 % IV BOLUS (SEPSIS)
500.0000 mL | Freq: Once | INTRAVENOUS | Status: AC
  Administered 2016-04-21: 500 mL via INTRAVENOUS

## 2016-04-21 MED ORDER — ETOMIDATE 2 MG/ML IV SOLN
INTRAVENOUS | Status: DC | PRN
  Administered 2016-04-21: 30 mg via INTRAVENOUS

## 2016-04-21 MED ORDER — ORAL CARE MOUTH RINSE
15.0000 mL | Freq: Four times a day (QID) | OROMUCOSAL | Status: DC
  Administered 2016-04-22 – 2016-05-07 (×63): 15 mL via OROMUCOSAL

## 2016-04-21 MED ORDER — DEXTROSE 5 % IV SOLN
1.0000 g | INTRAVENOUS | Status: DC
  Administered 2016-04-21 – 2016-04-22 (×2): 1 g via INTRAVENOUS
  Filled 2016-04-21 (×4): qty 1

## 2016-04-21 MED ORDER — ALBUTEROL SULFATE (2.5 MG/3ML) 0.083% IN NEBU: 2.5000 mg | INHALATION_SOLUTION | RESPIRATORY_TRACT | Status: DC | PRN

## 2016-04-21 MED ORDER — PRO-STAT SUGAR FREE PO LIQD
30.0000 mL | Freq: Three times a day (TID) | ORAL | Status: DC
  Administered 2016-04-21 – 2016-04-26 (×14): 30 mL
  Filled 2016-04-21 (×15): qty 30

## 2016-04-21 MED ORDER — NOREPINEPHRINE BITARTRATE 1 MG/ML IV SOLN
5.0000 ug/min | INTRAVENOUS | Status: DC
  Administered 2016-04-21 (×2): 15 ug/min via INTRAVENOUS
  Filled 2016-04-21 (×2): qty 4

## 2016-04-21 MED ORDER — ACETAMINOPHEN 160 MG/5ML PO SOLN: 650.0000 mg | ORAL | Status: DC | PRN

## 2016-04-21 MED ORDER — PIPERACILLIN-TAZOBACTAM IN DEX 2-0.25 GM/50ML IV SOLN
2.2500 g | Freq: Four times a day (QID) | INTRAVENOUS | Status: DC
  Filled 2016-04-21 (×2): qty 50

## 2016-04-21 MED ORDER — SODIUM BICARBONATE 8.4 % IV SOLN
50.0000 meq | Freq: Once | INTRAVENOUS | Status: AC
  Administered 2016-04-21: 50 meq via INTRAVENOUS
  Filled 2016-04-21: qty 50

## 2016-04-21 MED ORDER — VANCOMYCIN HCL IN DEXTROSE 1-5 GM/200ML-% IV SOLN
1000.0000 mg | Freq: Once | INTRAVENOUS | Status: DC
Start: 2016-04-21 — End: 2016-04-21

## 2016-04-21 MED ORDER — SODIUM CHLORIDE 0.9 % IV BOLUS (SEPSIS)
1000.0000 mL | Freq: Once | INTRAVENOUS | Status: AC
  Administered 2016-04-21: 1000 mL via INTRAVENOUS

## 2016-04-21 MED ORDER — FENTANYL CITRATE (PF) 100 MCG/2ML IJ SOLN
50.0000 ug | INTRAMUSCULAR | Status: DC | PRN
  Administered 2016-04-21 – 2016-04-22 (×4): 50 ug via INTRAVENOUS

## 2016-04-21 MED ORDER — HYDROCORTISONE NA SUCCINATE PF 100 MG IJ SOLR
50.0000 mg | Freq: Four times a day (QID) | INTRAMUSCULAR | Status: DC
  Administered 2016-04-21 (×2): 50 mg via INTRAVENOUS
  Filled 2016-04-21 (×3): qty 2

## 2016-04-21 MED ORDER — PROPOFOL 1000 MG/100ML IV EMUL
0.0000 ug/kg/min | INTRAVENOUS | Status: DC
  Administered 2016-04-21 (×2): 5 ug/kg/min via INTRAVENOUS

## 2016-04-21 MED ORDER — PROPOFOL 1000 MG/100ML IV EMUL
INTRAVENOUS | Status: AC
  Administered 2016-04-21: 5 ug/kg/min via INTRAVENOUS
  Filled 2016-04-21: qty 100

## 2016-04-21 MED ORDER — FENTANYL CITRATE (PF) 2500 MCG/50ML IJ SOLN
25.0000 ug/h | INTRAMUSCULAR | Status: DC
  Filled 2016-04-21: qty 50

## 2016-04-21 MED ORDER — NOREPINEPHRINE BITARTRATE 1 MG/ML IV SOLN
0.0000 ug/min | INTRAVENOUS | Status: DC
  Administered 2016-04-21: 5 ug/min via INTRAVENOUS
  Filled 2016-04-21: qty 4

## 2016-04-21 MED ORDER — ALBUTEROL SULFATE (2.5 MG/3ML) 0.083% IN NEBU
10.0000 mg | INHALATION_SOLUTION | Freq: Once | RESPIRATORY_TRACT | Status: AC
  Administered 2016-04-21: 10 mg via RESPIRATORY_TRACT
  Filled 2016-04-21: qty 12

## 2016-04-21 MED ORDER — SODIUM BICARBONATE 8.4 % IV SOLN
100.0000 meq | Freq: Once | INTRAVENOUS | Status: AC
  Administered 2016-04-21: 100 meq via INTRAVENOUS
  Filled 2016-04-21: qty 100

## 2016-04-21 MED ORDER — FENTANYL 2500MCG IN NS 250ML (10MCG/ML) PREMIX INFUSION
25.0000 ug/h | INTRAVENOUS | Status: DC
  Administered 2016-04-21: 25 ug/h via INTRAVENOUS
  Administered 2016-04-22: 150 ug/h via INTRAVENOUS
  Administered 2016-04-22 – 2016-04-25 (×12): 400 ug/h via INTRAVENOUS
  Administered 2016-04-25: 350 ug/h via INTRAVENOUS
  Filled 2016-04-21 (×19): qty 250

## 2016-04-21 MED ORDER — CHLORHEXIDINE GLUCONATE 0.12% ORAL RINSE (MEDLINE KIT)
15.0000 mL | Freq: Two times a day (BID) | OROMUCOSAL | Status: DC
  Administered 2016-04-21 – 2016-05-07 (×31): 15 mL via OROMUCOSAL

## 2016-04-21 MED ORDER — VASOPRESSIN 20 UNIT/ML IV SOLN
0.0300 [IU]/min | INTRAVENOUS | Status: DC
  Administered 2016-04-21 – 2016-04-22 (×2): 0.03 [IU]/min via INTRAVENOUS
  Filled 2016-04-21 (×2): qty 2

## 2016-04-21 MED ORDER — PHENYLEPHRINE HCL 10 MG/ML IJ SOLN
0.0000 ug/min | INTRAVENOUS | Status: DC
  Filled 2016-04-21: qty 1

## 2016-04-21 MED ORDER — INSULIN REGULAR HUMAN 100 UNIT/ML IJ SOLN
INTRAMUSCULAR | Status: DC
  Administered 2016-04-21: 5.2 [IU]/h via INTRAVENOUS
  Administered 2016-04-22: 24.7 [IU]/h via INTRAVENOUS
  Filled 2016-04-21 (×3): qty 2.5

## 2016-04-21 MED ORDER — SODIUM POLYSTYRENE SULFONATE 15 GM/60ML PO SUSP
30.0000 g | Freq: Once | ORAL | Status: AC
  Administered 2016-04-21: 30 g
  Filled 2016-04-21: qty 120

## 2016-04-21 MED ORDER — INSULIN ASPART 100 UNIT/ML IV SOLN
10.0000 [IU] | Freq: Once | INTRAVENOUS | Status: AC
Start: 2016-04-21 — End: 2016-04-21
  Administered 2016-04-21: 10 [IU] via INTRAVENOUS
  Filled 2016-04-21: qty 1

## 2016-04-21 MED ORDER — ACETAMINOPHEN 325 MG PO TABS: 650.0000 mg | ORAL_TABLET | ORAL | Status: DC | PRN

## 2016-04-21 MED ORDER — SODIUM CHLORIDE 0.9 % IV SOLN
1.0000 g | Freq: Once | INTRAVENOUS | Status: AC
  Administered 2016-04-21: 1 g via INTRAVENOUS
  Filled 2016-04-21: qty 10

## 2016-04-21 MED ORDER — ROCURONIUM BROMIDE 50 MG/5ML IV SOLN
INTRAVENOUS | Status: DC | PRN
  Administered 2016-04-21: 100 mg via INTRAVENOUS

## 2016-04-21 MED ORDER — PIPERACILLIN-TAZOBACTAM 3.375 G IVPB 30 MIN
3.3750 g | Freq: Once | INTRAVENOUS | Status: AC
  Administered 2016-04-21: 3.375 g via INTRAVENOUS
  Filled 2016-04-21: qty 50

## 2016-04-21 MED ORDER — PANTOPRAZOLE SODIUM 40 MG PO PACK
40.0000 mg | PACK | Freq: Every day | ORAL | Status: DC
  Administered 2016-04-22 – 2016-05-07 (×16): 40 mg
  Filled 2016-04-21 (×16): qty 20

## 2016-04-21 MED ORDER — ACETAMINOPHEN 160 MG/5ML PO SOLN
650.0000 mg | ORAL | Status: DC | PRN
  Administered 2016-04-21 – 2016-05-04 (×15): 650 mg
  Filled 2016-04-21 (×16): qty 20.3

## 2016-04-21 MED ORDER — ASPIRIN 81 MG PO CHEW
81.0000 mg | CHEWABLE_TABLET | Freq: Every day | ORAL | Status: DC
  Administered 2016-04-21 – 2016-05-07 (×17): 81 mg via ORAL
  Filled 2016-04-21 (×18): qty 1

## 2016-04-21 NOTE — ED Provider Notes (Addendum)
[facility]-EMERGENCY DEPT Provider Note   CSN: [id number] Arrival date & time: 2015-10-02  [id number]     History   Chief Complaint Chief Complaint  Patient presents with  . Altered Mental Status    Resp Distress   Level 5 caveat due to altered mental status. HPI Clayton [name] is a 60 y.o. male.  HPI Patient presents with altered mental status. Reportedly was responsive at 5 AM. When he was checked later he was unresponsive. Found to be hypotensive. Reportedly has been on Rocephin but we were not told why. Tachypnea. Nonverbal. Head CBG of 500   Past Medical History:  Diagnosis Date  . CHF (congestive heart failure) (HCC)   . Coronary artery disease   . CVA (cerebral infarction) 2007  . Diabetes (HCC)   . Diabetes mellitus without complication (HCC)   . Diabetic neuropathy (HCC)   . Diabetic retinopathy (HCC)   . GERD (gastroesophageal reflux disease)   . Hyperlipidemia   . Hypertension   . MI (myocardial infarction)    x2  . PNA (pneumonia)   . Stroke [facility](HCC)     Patient Active Problem List   Diagnosis Date Noted  . Cellulitis 01/06/2016  . Sepsis (HCC) 01/06/2016  . AKI (acute kidney injury) (HCC) 01/06/2016  . Pressure ulcer 01/06/2016    Class: Stage 1  . Hx of BKA (HCC) 08/04/2015  . Atrial flutter (HCC) 06/13/2015  . Chronic diastolic congestive heart failure (HCC) 06/09/2015  . H/O: stroke 08/10/2014  . CAD (coronary artery disease) 08/10/2014  . DM type 2 with diabetic peripheral neuropathy (HCC) 08/10/2014  . HLD (hyperlipidemia) 08/10/2014  . GERD (gastroesophageal reflux disease) 08/10/2014    Past Surgical History:  Procedure Laterality Date  . APPENDECTOMY    . BELOW KNEE LEG AMPUTATION Left   . ELECTROPHYSIOLOGIC STUDY N/A 08/03/2015   Procedure: A-Flutter Ablation;  Surgeon: Marinus MawGregg W Taylor, MD;  [facility];  Service: Cardiovascular;  Laterality: N/A;  . PERCUTANEOUS PLACEMENT INTRAVASCULAR STENT CERVICAL CAROTID ARTERY Right         Home Medications    Prior to Admission medications   Medication Sig Start Date End Date Taking? Authorizing Provider  albuterol (PROVENTIL HFA;VENTOLIN HFA) 108 (90 BASE) MCG/ACT inhaler Inhale 1-2 puffs into the lungs every 6 (six) hours as needed for wheezing or shortness of breath.    Historical Provider, MD  apixaban (ELIQUIS) 5 MG TABS tablet Take 1 tablet (5 mg total) by mouth 2 (two) times daily. 06/05/15   Kathlen ModyVijaya Akula, MD  aspirin EC 81 MG tablet Take 1 tablet (81 mg total) by mouth daily. 06/05/15   Kathlen ModyVijaya Akula, MD  atorvastatin (LIPITOR) 40 MG tablet Take 40 mg by mouth daily.    Historical Provider, MD  cholecalciferol (VITAMIN D) 1000 UNITS tablet Take 1,000 Units by mouth 2 (two) times daily.     Historical Provider, MD  furosemide (LASIX) 40 MG tablet Take 1 tablet (40 mg total) by mouth daily. 06/05/15   Kathlen ModyVijaya Akula, MD  gabapentin (NEURONTIN) 300 MG capsule Take 600 mg by mouth 4 (four) times daily.    Historical Provider, MD  insulin aspart (NOVOLOG) 100 UNIT/ML injection Inject 0-15 Units into the skin 3 (three) times daily with meals as needed for high blood sugar. Pt uses as needed per sliding scale:   Less than 120:  0 units 120-180:  2 units 181-220:4 units 221-260:  6 units 261-300:  8 units 301-350:  10 units 351-400:  12  units Greater than 400:  Call MD    Historical Provider, MD  insulin detemir (LEVEMIR) 100 UNIT/ML injection Inject 0.15 mLs (15 Units total) into the skin at bedtime. 01/10/16   Hollice Espy, MD  lisinopril (PRINIVIL,ZESTRIL) 2.5 MG tablet Take 2.5 mg by mouth daily.    Historical Provider, MD  magnesium oxide (MAG-OX) 400 (241.3 Mg) MG tablet Take 400 mg by mouth daily.    Historical Provider, MD  metoprolol tartrate (LOPRESSOR) 25 MG tablet Take 25 mg by mouth 2 (two) times daily.    Historical Provider, MD  omeprazole (PRILOSEC) 20 MG capsule Take 20 mg by mouth daily.    Historical Provider, MD  oxybutynin (DITROPAN-XL) 10 MG  24 hr tablet Take 10 mg by mouth daily.     Historical Provider, MD  oxyCODONE (ROXICODONE) 15 MG immediate release tablet Take 15 mg by mouth every 8 (eight) hours as needed for pain.    Historical Provider, MD    Family History Family History  Problem Relation Age of Onset  . Heart disease Father   . Diabetes Father   . Diabetes Sister     Social History Social History  Substance Use Topics  . Smoking status: Former Games developer  . Smokeless tobacco: Never Used  . Alcohol use No     Allergies   Niaspan [niacin er]   Review of Systems Review of Systems  Unable to perform ROS: Mental status change     Physical Exam Updated Vital Signs BP (!) 80/48   Pulse 112   Temp (!) 104.2 F (40.1 C) (Rectal)   Resp 18   Ht 5\' 10"  (1.778 m)   Wt 245 lb (111.1 kg)   SpO2 100%   BMI 35.15 kg/m   Physical Exam  Constitutional: He appears well-developed.  HENT:  Mucous membranes are very dry  Eyes:  Pupils are around 3 mm and sluggish  Neck: Neck supple. No JVD present.  Cardiovascular:  Tachycardia  Pulmonary/Chest:  Diffuse harsh breath sounds and tachypnea.  Abdominal: He exhibits no distension.  PEG tube in left upper abdomen.  Musculoskeletal: He exhibits no edema.  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  Neurological:   Moan but otherwise nonverbal. Will not move extremities to command. Some movement to pain.  Skin: Skin is warm.  Some skin breakdown in the sacral area.     ED Treatments / Results  Labs (all labs ordered are listed, but only abnormal results are displayed) Labs Reviewed  I-STAT CG4 LACTIC ACID, ED - Abnormal; Notable for the following:       Result Value   Lactic Acid, Venous 3.62 (*)    All other components within normal limits  I-STAT CHEM 8, ED - Abnormal; Notable for the following:    Potassium 6.9 (*)    Chloride 98 (*)    BUN 131 (*)    Creatinine, Ser 5.60 (*)    Glucose, Bld 485 (*)    Calcium, Ion  0.93 (*)    All other components within normal limits  CULTURE, BLOOD (ROUTINE X 2)  CULTURE, BLOOD (ROUTINE X 2)  URINE CULTURE  COMPREHENSIVE METABOLIC PANEL  CBC WITH DIFFERENTIAL/PLATELET  URINALYSIS, ROUTINE W REFLEX MICROSCOPIC (NOT AT [facility])  Rosezena Sensor, ED    EKG  EKG Interpretation None       Radiology Dg Chest Port 1 View  Result Date: May 02, 2016 CLINICAL DATA:  Hypoxia.  Fever. EXAM: PORTABLE CHEST 1 VIEW COMPARISON:  January 06, 2016 FINDINGS: Endotracheal tube tip is 3.6 cm above the carina. Central catheter tip is in the superior vena cava. No pneumothorax. There is no edema or consolidation. Heart is borderline enlarged with pulmonary vascularity within normal limits. There is atherosclerotic calcification in the aorta. Patient is status post coronary artery bypass grafting. IMPRESSION: Tube and catheter positions as described without pneumothorax. No edema or consolidation. Stable cardiac silhouette. Aortic atherosclerosis. Electronically Signed   By: Bretta Bang III M.D.   On: 04-25-16 07:56    Procedures Procedures (including critical care time)  Medications Ordered in ED Medications  sodium chloride 0.9 % bolus 1,000 mL (0 mLs Intravenous Stopped 04-25-16 0745)    And  sodium chloride 0.9 % bolus 1,000 mL (not administered)    And  sodium chloride 0.9 % bolus 1,000 mL (1,000 mLs Intravenous New Bag/Given 04-25-16 0747)    And  sodium chloride 0.9 % bolus 500 mL (not administered)  piperacillin-tazobactam (ZOSYN) IVPB 3.375 g (3.375 g Intravenous New Bag/Given 25-Apr-2016 0745)  vancomycin (VANCOCIN) 2,000 mg in sodium chloride 0.9 % 500 mL IVPB (not administered)  etomidate (AMIDATE) injection (30 mg Intravenous Not Given Apr 25, 2016 0731)  rocuronium (ZEMURON) injection (100 mg Intravenous Given 04-25-16 0731)  norepinephrine (LEVOPHED) 4 mg in dextrose 5 % 250 mL (0.016 mg/mL) infusion (10 mcg/min Intravenous Rate/Dose Change 04-25-16 0754)  propofol  (DIPRIVAN) 1000 MG/100ML infusion (not administered)  acetaminophen (TYLENOL) suppository 650 mg (650 mg Rectal Given 2016/04/25 0805)  albuterol (PROVENTIL) (2.5 MG/3ML) 0.083% nebulizer solution 10 mg (not administered)  insulin aspart (novoLOG) injection 10 Units (10 Units Intravenous Given 2016/04/25 0801)  sodium bicarbonate injection 50 mEq (50 mEq Intravenous Given 04/25/16 0802)     Initial Impression / Assessment and Plan / ED Course  I have reviewed the triage vital signs and the nursing notes.  Pertinent labs & imaging results that were available during my care of the patient were reviewed by me and considered in my medical decision making (see chart for details).  Clinical Course    Patient presents with altered mental status. No gag reflex. Decision made to intubate. Hypotensive. Code sepsis activated upon arrival. Found to have fever of 104. 30/kg fluid bolus given. Given Zosyn and vancomycin. Started on levophed drip after intubation. G-tube placed to suction. Discussed with ICU and they will see the patient when able. Found to be hyperkalemic. Given albuterol and bicarbonate bolus. Also given insulin.  CRITICAL CARE Performed by: Billee Cashing Total critical care time: 30 minutes Critical care time was exclusive of separately billable procedures and treating other patients. Critical care was necessary to treat or prevent imminent or life-threatening deterioration. Critical care was time spent personally by me on the following activities: development of treatment plan with patient and/or surrogate as well as nursing, discussions with consultants, evaluation of patient's response to treatment, examination of patient, obtaining history from patient or surrogate, ordering and performing treatments and interventions, ordering and review of laboratory studies, ordering and review of radiographic studies, pulse oximetry and re-evaluation of patient's  condition.  INTUBATION Performed by: Billee Cashing  Required items: required blood products, implants, devices, and special equipment available Patient identity confirmed: provided demographic data and hospital-assigned identification number Time out: Immediately prior to procedure a "time out" was called to verify the correct patient, procedure, equipment, support staff and site/side marked as required.  Indications: unresponsive  Intubation method: Glidescope Laryngoscopy   Preoxygenation: BVM  Sedatives: Etomidate Paralytic: Roccuronium  Tube Size: 7.5 cuffed  Post-procedure assessment: chest rise and ETCO2 monitor Breath sounds: equal and absent over the epigastrium Tube secured with: ETT holder Chest x-ray interpreted by radiologist and me.  Chest x-ray findings: endotracheal tube in appropriate position  Patient tolerated the procedure well with no immediate complications.    Final Clinical Impressions(s) / ED Diagnoses   Final diagnoses:  Septic shock (HCC)  AKI (acute kidney injury) (HCC)  Hyperkalemia    New Prescriptions New Prescriptions   No medications on file     Benjiman Core, MD 04/14/2016 1546    Benjiman Core, MD 05/07/2016 1546

## 2016-04-21 NOTE — ED Notes (Signed)
Intermittent suction applied to pt's PEG tube orange/tan liquid noted out

## 2016-04-21 NOTE — Progress Notes (Signed)
Initial Nutrition Assessment  DOCUMENTATION CODES:   Obesity unspecified  INTERVENTION:    Initiate TF via OGT with Vital High Protein at goal rate of 50 ml/h (1200 ml per day) and Prostat 30 ml TID to provide 1500 kcals, 150 gm protein, 1003 ml free water daily.  NUTRITION DIAGNOSIS:   Inadequate oral intake related to inability to eat as evidenced by NPO status.  GOAL:   Provide needs based on ASPEN/SCCM guidelines  MONITOR:   Vent status, Labs, Weight trends, TF tolerance, Skin, I & O's  REASON FOR ASSESSMENT:   Consult Enteral/tube feeding initiation and management  ASSESSMENT:   60 yo male SNF resident with hx CHF, CAD s/p remote stent, DM, HTN, A flutter s/p ablation on coumadin, severe PVD s/p R BKA, being treated for unknown infection at SNF with rocephin.  Presented 10/12 from SNF after being found unresponsive with agonal breaths. Required intubation.   Received MD Consult for TF initiation and management. Nutrition focused physical exam completed.  No muscle or subcutaneous fat depletion noticed. Patient is currently intubated on ventilator support MV: 11.9 L/min Temp (24hrs), Avg:102.3 F (39.1 C), Min:100.4 F (38 C), Max:104.2 F (40.1 C)  Labs reviewed sodium low at 133; glucose elevated at 700; BUN & creatinine elevated. Medications reviewed and include insulin, solu-cortef.  Diet Order:  Diet NPO time specified  Skin:  Wound (see comment) (DTI to sacrum)  Last BM:  unknown  Height:   Ht Readings from Last 1 Encounters:  Sep 28, 2015 5\' 10"  (1.778 m)    Weight:   Wt Readings from Last 1 Encounters:  Sep 28, 2015 245 lb (111.1 kg)    Ideal Body Weight:  75.5 kg  BMI:  Body mass index is 35.15 kg/m.  Estimated Nutritional Needs:   Kcal:  [id number]  Protein:  >/= 150 gm  Fluid:  2 L  EDUCATION NEEDS:   No education needs identified at this time  Joaquin CourtsKimberly Wilber Fini, RD, LDN, CNSC Pager [phone] After Hours Pager [phone]

## 2016-04-21 NOTE — Progress Notes (Signed)
Interim Note:   Nursing reported patient's HR intermittently elevated with some intermittent a-flutter. Discussed with Dr. Molli KnockYacoub. Switched order from Levo to Phenylephrine.   Palma HolterKanishka G Gunadasa, MD PGY 2 Family Medicine

## 2016-04-21 NOTE — ED Notes (Signed)
Pt intubated + color change good breath sounds 7.5Ett 23cm at the lip [facility]

## 2016-04-21 NOTE — ED Notes (Signed)
Called SNF and they stated PICC line was placed yesterday and they also stated that they would call family.  CCM notified.

## 2016-04-21 NOTE — Progress Notes (Signed)
ANTICOAGULATION CONSULT NOTE - Initial Consult  Pharmacy Consult for heparin Indication: atrial fibrillation  Allergies  Allergen Reactions  . Niaspan [Niacin Er] Hives and Other (See Comments)    Reaction:  Facial redness/hotness    Patient Measurements: Height: 5\' 10"  (177.8 cm) Weight: 245 lb (111.1 kg) IBW/kg (Calculated) : 73 Heparin Dosing Weight: 97kg  Vital Signs: Temp: 102 F (38.9 C) (10/12 1945) Temp Source: Oral (10/12 1558) BP: 98/51 (10/12 1540) Pulse Rate: 79 (10/12 1945)  Labs:  Recent Labs  20-Mar-2016 0737 20-Mar-2016 0743 20-Mar-2016 0937 20-Mar-2016 1200 20-Mar-2016 1510 20-Mar-2016 2050  HGB 12.2* 13.6 11.3*  --   --   --   HCT 39.0 40.0 36.3*  --   --   --   PLT 425*  --  422*  --   --   --   APTT  --   --  34  --   --   --   LABPROT  --   --  20.7*  --   --   --   INR  --   --  1.76  --   --   --   HEPARINUNFRC  --   --   --   --   --  0.41  CREATININE 5.81* 5.60* 5.67* 4.09* 4.42*  --   TROPONINI  --   --  0.06*  --  0.06*  --    Estimated Creatinine Clearance: 21.6 mL/min (by C-G formula based on SCr of 4.42 mg/dL (H)).  Medical History: Past Medical History:  Diagnosis Date  . CHF (congestive heart failure) (HCC)   . Coronary artery disease   . CVA (cerebral infarction) 2007  . Diabetes (HCC)   . Diabetes mellitus without complication (HCC)   . Diabetic neuropathy (HCC)   . Diabetic retinopathy (HCC)   . GERD (gastroesophageal reflux disease)   . Hyperlipidemia   . Hypertension   . MI (myocardial infarction)    x2  . PNA (pneumonia)   . Stroke [facility](HCC)     Medications:  Infusions:  . fentaNYL 100 mcg/hr (20-Mar-2016 1400)  . heparin 1,400 Units/hr (20-Mar-2016 1117)  . insulin (NOVOLIN-R) infusion 25.6 Units/hr (20-Mar-2016 2132)  . norepinephrine (LEVOPHED) Adult infusion 10 mcg/min (20-Mar-2016 1752)  . phenylephrine (NEO-SYNEPHRINE) Adult infusion Stopped (20-Mar-2016 1600)  . propofol (DIPRIVAN) infusion Stopped (20-Mar-2016 0915)  . vasopressin  (PITRESSIN) infusion - *FOR SHOCK* 0.03 Units/min (20-Mar-2016 0956)    Assessment: 60 yom presented to the ED unresponsive. He is on coumadin PTA for afib. INR is subtherapeutic. Heparin infusion started with first heparin level therapeutic at 0.41. H/H 11.3/36.3, plts elevated. No bleeding noted.   Goal of Therapy:  Heparin level 0.3-0.7 units/ml Monitor platelets by anticoagulation protocol: Yes   Plan:  - Continue heparin gtt 1400 units/hr - Daily heparin level and CBC  Clayton [name] Oct 06, 2015,9:40 PM

## 2016-04-21 NOTE — Consult Note (Addendum)
WOC Nurse wound consult note Reason for Consult: Consult requested for sacrum. Pt was found unresponsive for unknown period of time prior to admission, according to the EMR. Wound type: Sacrum/bilat buttocks with deep tissue pressure injury in a butterfly pattern Pressure Ulcer POA: Yes Measurement: Erythemia surrounding wound extends 10X10cm.  Dark purple deep tissue injury in the center is 5X7cm, with blistered edges beginning to peel.  Left lower buttock with deep tissue injury; dark reddish-purple without blistering, 4X4cm Drainage (amount, consistency, odor) No open wound or drainage at this time.  Deep tissue injuries are high risk to evolve into full thickness tissue loss despite optimal plan of care. Dressing procedure/placement/frequency: Pt is on a Sport low-airloss bed to reduce pressure.  Foam dressing to protect from further injury.  Will re-assess next week to determine if wound has evolved and needs further topical treatment at that time. No family members present to discuss plan of care. Cammie Mcgeeawn Mahmoud Blazejewski MSN, RN, CWOCN, AlmaWCN-AP, CNS [phone]

## 2016-04-21 NOTE — Progress Notes (Signed)
eLink Physician-Brief Progress Note Patient Name: Clayton KidaLarry K Barkdull DOB: Nov 15, 1955 MRN: [id number]   Date of Service  02/18/2016  HPI/Events of Note  SUP  eICU Interventions  protonix      Intervention Category Major Interventions: Arrhythmia - evaluation and management;Hypotension - evaluation and management Intermediate Interventions: Best-practice therapies (e.g. DVT, beta blocker, etc.)  Jenevieve Kirschbaum V. 02/18/2016, 3:56 PM

## 2016-04-21 NOTE — ED Notes (Signed)
Spoke with Intesivist regarding the PICC line, MD wants to continue to use the line no new line as per MD to be placed at this time current PICC line ok to use

## 2016-04-21 NOTE — Progress Notes (Signed)
eLink Physician-Brief Progress Note Patient Name: Clayton [name] DOB: 01/03/1956 MRN: [id number]   Date of Service  05/06/2016  HPI/Events of Note  Hyperkalemia Glu 781 - on insulin gt  eICU Interventions  Dc solucortef Bicarb, calcium, kayexalate, rpt K in 4h     Intervention Category Major Interventions: Electrolyte abnormality - evaluation and management  Ismael Karge V. 05/07/2016, 6:30 PM

## 2016-04-21 NOTE — Progress Notes (Signed)
ANTICOAGULATION CONSULT NOTE - Initial Consult  Pharmacy Consult for heparin Indication: atrial fibrillation  Allergies  Allergen Reactions  . Niaspan [Niacin Er] Hives and Other (See Comments)    Reaction:  Facial redness/hotness     Patient Measurements: Height: 5\' 10"  (177.8 cm) Weight: 245 lb (111.1 kg) IBW/kg (Calculated) : 73 Heparin Dosing Weight: 97kg  Vital Signs: Temp: 100.4 F (38 C) (10/12 0955) Temp Source: Core (Comment) (10/12 0955) BP: 115/63 (10/12 1105) Pulse Rate: 103 (10/12 1105)  Labs:  Recent Labs  04/14/2016 0737 04/11/2016 0743 05/07/2016 0937  HGB 12.2* 13.6 11.3*  HCT 39.0 40.0 36.3*  PLT 425*  --  422*  APTT  --   --  34  LABPROT  --   --  20.7*  INR  --   --  1.76  CREATININE 5.81* 5.60* 5.67*  TROPONINI  --   --  0.06*    Estimated Creatinine Clearance: 16.9 mL/min (by C-G formula based on SCr of 5.67 mg/dL (H)).   Medical History: Past Medical History:  Diagnosis Date  . CHF (congestive heart failure) (HCC)   . Coronary artery disease   . CVA (cerebral infarction) 2007  . Diabetes (HCC)   . Diabetes mellitus without complication (HCC)   . Diabetic neuropathy (HCC)   . Diabetic retinopathy (HCC)   . GERD (gastroesophageal reflux disease)   . Hyperlipidemia   . Hypertension   . MI (myocardial infarction)    x2  . PNA (pneumonia)   . Stroke [facility](HCC)     Medications:  Infusions:  . ceFEPime (MAXIPIME) IV    . fentaNYL 25 mcg/hr (04/12/2016 0945)  . heparin    . norepinephrine (LEVOPHED) Adult infusion 15 mcg/min (04/29/2016 0936)  . propofol (DIPRIVAN) infusion Stopped (05/03/2016 0915)  . vasopressin (PITRESSIN) infusion - *FOR SHOCK* 0.03 Units/min (05/09/2016 0956)    Assessment: 60 yom presented to the ED unresponsive. He is on coumadin PTA for afib. INR is subtherapeutic so he will be initiated on IV heparin. H/H 11.3/36.3, plts elevated. No bleeding noted.   Goal of Therapy:  Heparin level 0.3-0.7 units/ml Monitor platelets  by anticoagulation protocol: Yes   Plan:  - Heparin gtt 1400 units/hr - Check an 8 hr heparin level - Daily heparin level and CBC  Clayton [name], Clayton [name] 04/18/2016,11:06 AM

## 2016-04-21 NOTE — Progress Notes (Addendum)
Pharmacy Antibiotic Note  Clayton [name] is a 60 y.o. male admitted on 30-Sep-2015 with sepsis.  Pharmacy has been consulted for vancomycin and cefepime dosing. Tmax is 104.2 and WBC is significantly elevated at 36.2. Lactic acid is elevated at 3.62 and SCr is above baseline at 5.81 (Baseline ~1.3).   Plan: - Vancomycin 2gm IV x 1 - trend Scr for further doses - Cefepime 1gm IV Q24H - F/u renal fxn, C&S, clinical status and trough at SS  Height: 5\' 10"  (177.8 cm) Weight: 245 lb (111.1 kg) IBW/kg (Calculated) : 73  Temp (24hrs), Avg:104.2 F (40.1 C), Min:104.2 F (40.1 C), Max:104.2 F (40.1 C)   Recent Labs Lab 05/30/2016 0737 05/30/2016 0743 05/30/2016 0744  WBC 36.2*  --   --   CREATININE 5.81* 5.60*  --   LATICACIDVEN  --   --  3.62*    Estimated Creatinine Clearance: 17.1 mL/min (by C-G formula based on SCr of 5.6 mg/dL (H)).    Allergies  Allergen Reactions  . Niaspan [Niacin Er] Hives and Other (See Comments)    Reaction:  Facial redness/hotness     Antimicrobials this admission: Vanc 10/12>> Cefepime 10/12>> Zosyn 10/12 x 1  Dose adjustments this admission: N/A  Microbiology results: Pending  Thank you for allowing pharmacy to be a part of this patient's care.  Clayton [name], Clayton [name] 30-Sep-2015 8:32 AM

## 2016-04-21 NOTE — ED Notes (Signed)
MD at bedside. 

## 2016-04-21 NOTE — ED Notes (Signed)
Intensivist MD at bedside. 

## 2016-04-21 NOTE — ED Notes (Signed)
Pt's Propofol stopped due to BP levels dropping Intensivist at bedside for confirmation of this plan

## 2016-04-21 NOTE — ED Notes (Signed)
Pt intubated Levophed infusing pt remains unresponsive with no sedation other than what was used for intubation pt remains on the monitor

## 2016-04-21 NOTE — Progress Notes (Signed)
eLink Physician-Brief Progress Note Patient Name: Clayton [name] DOB: 24-Mar-1956 MRN: [id number]   Date of Service  05/07/2016  HPI/Events of Note  Back in nSR Levo being weaned  eICU Interventions  Keep on levo unless tachy or RVR     Intervention Category Major Interventions: Arrhythmia - evaluation and management;Hypotension - evaluation and management  Clayton [name] V. 04/18/2016, 3:53 PM

## 2016-04-21 NOTE — ED Notes (Signed)
CBG as per EMS en route 548

## 2016-04-21 NOTE — ED Notes (Signed)
Pt arrives unresponsive Rt, ER Md, Pharmacy, CNA and RN to bedside pt continues with bag valve mask respirations being delivered on monitor ST with low BP

## 2016-04-21 NOTE — H&P (Signed)
PULMONARY / CRITICAL CARE MEDICINE   Name: Ree KidaLarry K Dimaano MRN: [id number] DOB: 1956-04-19    ADMISSION DATE:  25-Jul-2015  REFERRING MD:  Rubin PayorPickering (EDP)   CHIEF COMPLAINT:  Sepsis, respiratory failure   HISTORY OF PRESENT ILLNESS:   60yo male SNF resident with hx CHF, CAD s/p remote stent, DM, HTN, Aflutter s/p ablation on coumadin, severe PVD s/p R BKA, being treated for unknown infection at SNF with rocephin.  Presented 10/12 from SNF after being found unresponsive with agonal breaths.  Bagged by EMS en route and intubated on arrival to ER.  In ER was febrile to 104, hypotensive with SBP 70's.  Labs notable for AKI with K 7.1, glucose 500, WBC 36, lactate 3.6.  PCCM consulted for admit.   PAST MEDICAL HISTORY :  He  has a past medical history of CHF (congestive heart failure) (HCC); Coronary artery disease; CVA (cerebral infarction) (2007); Diabetes (HCC); Diabetes mellitus without complication (HCC); Diabetic neuropathy (HCC); Diabetic retinopathy (HCC); GERD (gastroesophageal reflux disease); Hyperlipidemia; Hypertension; MI (myocardial infarction); PNA (pneumonia); and Stroke (HCC).  PAST SURGICAL HISTORY: He  has a past surgical history that includes Below knee leg amputation (Left); Appendectomy; Percutaneous placement intravascular stent cervical carotid artery (Right); and Cardiac catheterization (N/A, 08/03/2015).  Allergies  Allergen Reactions  . Niaspan [Niacin Er] Hives and Other (See Comments)    Reaction:  Facial redness/hotness     No current facility-administered medications on file prior to encounter.    Current Outpatient Prescriptions on File Prior to Encounter  Medication Sig  . albuterol (PROVENTIL HFA;VENTOLIN HFA) 108 (90 BASE) MCG/ACT inhaler Inhale 1-2 puffs into the lungs every 6 (six) hours as needed for wheezing or shortness of breath.  Marland Kitchen. apixaban (ELIQUIS) 5 MG TABS tablet Take 1 tablet (5 mg total) by mouth 2 (two) times daily.  Marland Kitchen. aspirin EC 81 MG tablet  Take 1 tablet (81 mg total) by mouth daily.  Marland Kitchen. atorvastatin (LIPITOR) 40 MG tablet Take 40 mg by mouth daily.  . cholecalciferol (VITAMIN D) 1000 UNITS tablet Take 1,000 Units by mouth 2 (two) times daily.   . furosemide (LASIX) 40 MG tablet Take 1 tablet (40 mg total) by mouth daily.  Marland Kitchen. gabapentin (NEURONTIN) 300 MG capsule Take 600 mg by mouth 4 (four) times daily.  . insulin aspart (NOVOLOG) 100 UNIT/ML injection Inject 0-15 Units into the skin 3 (three) times daily with meals as needed for high blood sugar. Pt uses as needed per sliding scale:   Less than 120:  0 units 120-180:  2 units 181-220:4 units 221-260:  6 units 261-300:  8 units 301-350:  10 units 351-400:  12 units Greater than 400:  Call MD  . insulin detemir (LEVEMIR) 100 UNIT/ML injection Inject 0.15 mLs (15 Units total) into the skin at bedtime.  Marland Kitchen. lisinopril (PRINIVIL,ZESTRIL) 2.5 MG tablet Take 2.5 mg by mouth daily.  . magnesium oxide (MAG-OX) 400 (241.3 Mg) MG tablet Take 400 mg by mouth daily.  . metoprolol tartrate (LOPRESSOR) 25 MG tablet Take 25 mg by mouth 2 (two) times daily.  Marland Kitchen. omeprazole (PRILOSEC) 20 MG capsule Take 20 mg by mouth daily.  Marland Kitchen. oxybutynin (DITROPAN-XL) 10 MG 24 hr tablet Take 10 mg by mouth daily.   Marland Kitchen. oxyCODONE (ROXICODONE) 15 MG immediate release tablet Take 15 mg by mouth every 8 (eight) hours as needed for pain.    FAMILY HISTORY:  His indicated that his mother is alive. He indicated that his father is deceased. He  indicated that both of his sisters are alive.    SOCIAL HISTORY: He  reports that he has quit smoking. He has never used smokeless tobacco. He reports that he does not drink alcohol or use drugs.  REVIEW OF SYSTEMS:   As per HPI - All other systems reviewed and were neg.    SUBJECTIVE:    VITAL SIGNS: BP 109/59 (BP Location: Left Arm)   Pulse 91   Temp (!) 104.2 F (40.1 C) (Rectal)   Resp 20   Ht 5\' 10"  (1.778 m)   Wt 111.1 kg (245 lb)   SpO2 100%   BMI 35.15  kg/m   HEMODYNAMICS:    VENTILATOR SETTINGS: Vent Mode: PRVC FiO2 (%):  [100 %] 100 % Set Rate:  [18 bmp] 18 bmp Vt Set:  [580 mL] 580 mL PEEP:  [5 cmH20] 5 cmH20 Plateau Pressure:  [39 cmH20] 39 cmH20  INTAKE / OUTPUT: No intake/output data recorded.  PHYSICAL EXAMINATION: General:  Chronically ill appearing male, acutely ill appearing  Neuro:  Sedated on vent, RASS -2 HEENT:  Mm dry, ETT, no JVD  Cardiovascular:  s1s2 rrr Lungs:  resps even non labored on full vent support, coarse Abdomen:  Round, soft, hypoactive bs  Musculoskeletal:  Warm and dry, L BKA, sacral decub   LABS:  BMET  Recent Labs Lab May 08, 2016 0737 04/19/2016 0743  NA 135 136  K 7.1* 6.9*  CL 95* 98*  CO2 23  --   BUN 112* 131*  CREATININE 5.81* 5.60*  GLUCOSE 500* 485*    Electrolytes  Recent Labs Lab 05/05/2016 0737  CALCIUM 8.0*    CBC  Recent Labs Lab 05/02/2016 0737 May 08, 2016 0743  WBC 36.2*  --   HGB 12.2* 13.6  HCT 39.0 40.0  PLT 425*  --     Coag's No results for input(s): APTT, INR in the last 168 hours.  Sepsis Markers  Recent Labs Lab 04/10/2016 0744  LATICACIDVEN 3.62*    ABG No results for input(s): PHART, PCO2ART, PO2ART in the last 168 hours.  Liver Enzymes  Recent Labs Lab 05/05/2016 0737  AST 32  ALT 40  ALKPHOS 91  BILITOT 1.1  ALBUMIN 3.5    Cardiac Enzymes No results for input(s): TROPONINI, PROBNP in the last 168 hours.  Glucose No results for input(s): GLUCAP in the last 168 hours.  Imaging Dg Chest Port 1 View  Result Date: 05/06/2016 CLINICAL DATA:  Hypoxia.  Fever EXAM: PORTABLE CHEST 1 VIEW COMPARISON:  08-May-2016 study obtained earlier in the day FINDINGS: Endotracheal tube tip is 3.3 cm above the carina. Central catheter tip is at the junction of the right innominate vein and superior vena cava. No pneumothorax. There is mild bibasilar atelectasis. Lungs elsewhere are clear. Heart is borderline enlarged with pulmonary vascularity  within normal limits. No adenopathy. Patient is status post coronary artery bypass grafting. IMPRESSION: Tube and catheter positions as described without pneumothorax. Bibasilar atelectasis. No edema or consolidation. Stable cardiac silhouette. Electronically Signed   By: Bretta Bang III M.D.   On: 04/13/2016 09:25   Dg Chest Port 1 View  Result Date: 04/26/2016 CLINICAL DATA:  Hypoxia.  Fever. EXAM: PORTABLE CHEST 1 VIEW COMPARISON:  January 06, 2016 FINDINGS: Endotracheal tube tip is 3.6 cm above the carina. Central catheter tip is in the superior vena cava. No pneumothorax. There is no edema or consolidation. Heart is borderline enlarged with pulmonary vascularity within normal limits. There is atherosclerotic calcification in the  aorta. Patient is status post coronary artery bypass grafting. IMPRESSION: Tube and catheter positions as described without pneumothorax. No edema or consolidation. Stable cardiac silhouette. Aortic atherosclerosis. Electronically Signed   By: Bretta Bang III M.D.   On: 05/01/2016 07:56     STUDIES:  Renal u/s 10/12>>>  Echo 10/12>>>  CULTURES: BC 10/12>>> Urine 10/12>>> Sputum 10/12>>>  ANTIBIOTICS: vanc 10/12>>> Zosyn 10/12>>>  SIGNIFICANT EVENTS:   LINES/TUBES: ETT 10/12>>>  RUE PICC (SNF) 10/11>>   DISCUSSION: 60yo male with multiple medical problems   ASSESSMENT / PLAN:  PULMONARY Acute respiratory failure  HCAP  P:   Vent support - 8cc/kg  F/u CXR  F/u ABG abx as above  Sputum culture  Daily SBT  PRN BD   CARDIOVASCULAR Septic shock - r/t HCAP and probable UTI  Hx CAD  Hx CHF  A Flutter s/p ablation on coumadin  P:  Hold home lasix, metoprolol, lisinopril, eliquis  Heparin gtt per pharmacy once INR <2 Trend troponin  ASA  Levophed - titrate for MAP >65 Check echo    RENAL Presumed UTI  AKI  Hyperkalemia  AG metabolic acidosis  ?DKA  P:   Volume resuscitation  F/u chem now  HCO3, insulin given for  hyperkalemia  Check u/a  ICU hyperglycemia protocol for now - may need to switch to insulin gtt if DKA  Renal u/s now    GASTROINTESTINAL No active issue  P:   NPO for now  PPI  TF   HEMATOLOGIC Leukocytosis  Anemia - chronic  P:  F/u CBC  Heparin gtt   INFECTIOUS Septic shock  UTI  Decub - doubt source of sepsis  HCAP  P:   Broad spectrum abx as above  Pan culture  Will leave PICC for now - just placed 10/11 but if refractory shock may need to d/c  ICU procalcitonin algorithm  Trend lactate   ENDOCRINE DM  P:   ICU hyperglycemia protocol   NEUROLOGIC AMS - r/t septic shock, respiratory failure  P:   RASS goal: -1 D/c propofol started in ER  Fentanyl gtt  PRN versed    FAMILY  - Updates: no family available 10/12, SNF to alert family   - Inter-disciplinary family meet or Palliative Care meeting due by:  10/19  Dirk Dress, NP 04/24/2016  9:30 AM Pager: [phone] or [phone]  Attending Note:  60 year old male with extensive PMH including DM, HTN and s/p amputation of left leg who was in the SNF for antibiotics for UTI, was found unresponsive in SNF and was brought to the ED.  In the ED, he was intubated and started on levophed for BP support.  On exam, coarse BS diffusely.  I reviewed CXR myself, ETT ok.  Continue vanc/zosyn.  F/U on culture.  Add vasopressin for BP support.  Will need to discuss code status when family is available.  Change propofol to a fentanyl drip and versed pushes.  R/O DKA via U/A to look for ketones.  Wound care consult called.  The patient is critically ill with multiple organ systems failure and requires high complexity decision making for assessment and support, frequent evaluation and titration of therapies, application of advanced monitoring technologies and extensive interpretation of multiple databases.   Critical Care Time devoted to patient care services described in this note is  35  Minutes. This time  reflects time of care of this signee Dr Koren Bound. This critical care time does not reflect procedure  time, or teaching time or supervisory time of PA/NP/Med student/Med Resident etc but could involve care discussion time.  Alyson Reedy, M.D. [facility] Pulmonary/Critical Care Medicine. Pager: [phone]. After hours pager: [phone].

## 2016-04-21 NOTE — ED Triage Notes (Signed)
Staff was speaking with pt at 5am than when they went to check on the pt they found him unresponsive with agonal breathing

## 2016-04-21 NOTE — Progress Notes (Signed)
Critical glucose of 718 called from lab.

## 2016-04-22 ENCOUNTER — Inpatient Hospital Stay (HOSPITAL_COMMUNITY): Payer: Medicare HMO

## 2016-04-22 DIAGNOSIS — N179 Acute kidney failure, unspecified: Secondary | ICD-10-CM

## 2016-04-22 DIAGNOSIS — J9600 Acute respiratory failure, unspecified whether with hypoxia or hypercapnia: Secondary | ICD-10-CM

## 2016-04-22 DIAGNOSIS — R6521 Severe sepsis with septic shock: Secondary | ICD-10-CM

## 2016-04-22 LAB — CBC
HEMATOCRIT: 31.8 % — AB (ref 39.0–52.0)
HEMOGLOBIN: 10.2 g/dL — AB (ref 13.0–17.0)
MCH: 27.4 pg (ref 26.0–34.0)
MCHC: 32.1 g/dL (ref 30.0–36.0)
MCV: 85.5 fL (ref 78.0–100.0)
Platelets: 304 10*3/uL (ref 150–400)
RBC: 3.72 MIL/uL — ABNORMAL LOW (ref 4.22–5.81)
RDW: 19.4 % — ABNORMAL HIGH (ref 11.5–15.5)
WBC: 19.4 10*3/uL — ABNORMAL HIGH (ref 4.0–10.5)

## 2016-04-22 LAB — RESPIRATORY PANEL BY PCR
Adenovirus: NOT DETECTED
BORDETELLA PERTUSSIS-RVPCR: NOT DETECTED
CORONAVIRUS OC43-RVPPCR: NOT DETECTED
Chlamydophila pneumoniae: NOT DETECTED
Coronavirus 229E: NOT DETECTED
Coronavirus HKU1: NOT DETECTED
Coronavirus NL63: NOT DETECTED
INFLUENZA B-RVPPCR: NOT DETECTED
Influenza A: NOT DETECTED
METAPNEUMOVIRUS-RVPPCR: NOT DETECTED
Mycoplasma pneumoniae: NOT DETECTED
PARAINFLUENZA VIRUS 1-RVPPCR: NOT DETECTED
PARAINFLUENZA VIRUS 2-RVPPCR: NOT DETECTED
PARAINFLUENZA VIRUS 3-RVPPCR: NOT DETECTED
PARAINFLUENZA VIRUS 4-RVPPCR: NOT DETECTED
RESPIRATORY SYNCYTIAL VIRUS-RVPPCR: NOT DETECTED
RHINOVIRUS / ENTEROVIRUS - RVPPCR: NOT DETECTED

## 2016-04-22 LAB — POCT I-STAT 3, ART BLOOD GAS (G3+)
ACID-BASE EXCESS: 2 mmol/L (ref 0.0–2.0)
BICARBONATE: 26 mmol/L (ref 20.0–28.0)
O2 Saturation: 95 %
PH ART: 7.424 (ref 7.350–7.450)
PO2 ART: 75 mmHg — AB (ref 83.0–108.0)
TCO2: 27 mmol/L (ref 0–100)
pCO2 arterial: 40 mmHg (ref 32.0–48.0)

## 2016-04-22 LAB — GLUCOSE, CAPILLARY
GLUCOSE-CAPILLARY: 134 mg/dL — AB (ref 65–99)
GLUCOSE-CAPILLARY: 137 mg/dL — AB (ref 65–99)
GLUCOSE-CAPILLARY: 138 mg/dL — AB (ref 65–99)
GLUCOSE-CAPILLARY: 157 mg/dL — AB (ref 65–99)
GLUCOSE-CAPILLARY: 164 mg/dL — AB (ref 65–99)
GLUCOSE-CAPILLARY: 182 mg/dL — AB (ref 65–99)
GLUCOSE-CAPILLARY: 307 mg/dL — AB (ref 65–99)
GLUCOSE-CAPILLARY: 347 mg/dL — AB (ref 65–99)
Glucose-Capillary: 256 mg/dL — ABNORMAL HIGH (ref 65–99)
Glucose-Capillary: 185 mg/dL — ABNORMAL HIGH (ref 65–99)
Glucose-Capillary: 120 mg/dL — ABNORMAL HIGH (ref 65–99)
Glucose-Capillary: 151 mg/dL — ABNORMAL HIGH (ref 65–99)
Glucose-Capillary: 144 mg/dL — ABNORMAL HIGH (ref 65–99)
Glucose-Capillary: 156 mg/dL — ABNORMAL HIGH (ref 65–99)
Glucose-Capillary: 173 mg/dL — ABNORMAL HIGH (ref 65–99)
Glucose-Capillary: 164 mg/dL — ABNORMAL HIGH (ref 65–99)
Glucose-Capillary: 167 mg/dL — ABNORMAL HIGH (ref 65–99)
Glucose-Capillary: 169 mg/dL — ABNORMAL HIGH (ref 65–99)
Glucose-Capillary: 166 mg/dL — ABNORMAL HIGH (ref 65–99)
Glucose-Capillary: 195 mg/dL — ABNORMAL HIGH (ref 65–99)

## 2016-04-22 LAB — BASIC METABOLIC PANEL
ANION GAP: 11 (ref 5–15)
Anion gap: 11 (ref 5–15)
BUN: 80 mg/dL — ABNORMAL HIGH (ref 6–20)
BUN: 72 mg/dL — ABNORMAL HIGH (ref 6–20)
CALCIUM: 8.4 mg/dL — AB (ref 8.9–10.3)
CALCIUM: 8.3 mg/dL — AB (ref 8.9–10.3)
CO2: 28 mmol/L (ref 22–32)
CO2: 28 mmol/L (ref 22–32)
CREATININE: 2.24 mg/dL — AB (ref 0.61–1.24)
CREATININE: 1.84 mg/dL — AB (ref 0.61–1.24)
Chloride: 108 mmol/L (ref 101–111)
Chloride: 108 mmol/L (ref 101–111)
GFR, EST AFRICAN AMERICAN: 34 mL/min — AB (ref >60)
GFR, EST AFRICAN AMERICAN: 44 mL/min — AB (ref >60)
GFR, EST NON AFRICAN AMERICAN: 30 mL/min — AB (ref >60)
GFR, EST NON AFRICAN AMERICAN: 38 mL/min — AB (ref >60)
Glucose, Bld: 188 mg/dL — ABNORMAL HIGH (ref 65–99)
Glucose, Bld: 145 mg/dL — ABNORMAL HIGH (ref 65–99)
Potassium: 3 mmol/L — ABNORMAL LOW (ref 3.5–5.1)
Potassium: 3.7 mmol/L (ref 3.5–5.1)
SODIUM: 147 mmol/L — AB (ref 135–145)
Sodium: 147 mmol/L — ABNORMAL HIGH (ref 135–145)

## 2016-04-22 LAB — BLOOD CULTURE ID PANEL (REFLEXED)
ACINETOBACTER BAUMANNII: NOT DETECTED
CANDIDA ALBICANS: NOT DETECTED
CANDIDA GLABRATA: NOT DETECTED
CANDIDA PARAPSILOSIS: NOT DETECTED
CANDIDA TROPICALIS: NOT DETECTED
Candida krusei: NOT DETECTED
ENTEROBACTER CLOACAE COMPLEX: NOT DETECTED
ENTEROBACTERIACEAE SPECIES: NOT DETECTED
ESCHERICHIA COLI: NOT DETECTED
Enterococcus species: NOT DETECTED
Haemophilus influenzae: NOT DETECTED
KLEBSIELLA OXYTOCA: NOT DETECTED
KLEBSIELLA PNEUMONIAE: NOT DETECTED
Listeria monocytogenes: NOT DETECTED
Methicillin resistance: DETECTED — AB
Neisseria meningitidis: NOT DETECTED
PSEUDOMONAS AERUGINOSA: NOT DETECTED
Proteus species: NOT DETECTED
STREPTOCOCCUS PNEUMONIAE: NOT DETECTED
STREPTOCOCCUS PYOGENES: NOT DETECTED
Serratia marcescens: NOT DETECTED
Staphylococcus aureus (BCID): NOT DETECTED
Staphylococcus species: DETECTED — AB
Streptococcus agalactiae: NOT DETECTED
Streptococcus species: NOT DETECTED

## 2016-04-22 LAB — LACTIC ACID, PLASMA
LACTIC ACID, VENOUS: 3.5 mmol/L — AB (ref 0.5–1.9)
LACTIC ACID, VENOUS: 2.7 mmol/L — AB (ref 0.5–1.9)
Lactic Acid, Venous: 2.5 mmol/L (ref 0.5–1.9)
Lactic Acid, Venous: 1.9 mmol/L (ref 0.5–1.9)

## 2016-04-22 LAB — ECHOCARDIOGRAM COMPLETE
Height: 70 in
Weight: 3530.89 oz

## 2016-04-22 LAB — URINE CULTURE: Culture: NO GROWTH

## 2016-04-22 LAB — HEPARIN LEVEL (UNFRACTIONATED): Heparin Unfractionated: 0.38 IU/mL (ref 0.30–0.70)

## 2016-04-22 LAB — PHOSPHORUS: Phosphorus: 2.1 mg/dL — ABNORMAL LOW (ref 2.5–4.6)

## 2016-04-22 LAB — PROCALCITONIN: Procalcitonin: 24.88 ng/mL

## 2016-04-22 LAB — MAGNESIUM: Magnesium: 2.5 mg/dL — ABNORMAL HIGH (ref 1.7–2.4)

## 2016-04-22 MED ORDER — MIDAZOLAM HCL 2 MG/2ML IJ SOLN
1.0000 mg | INTRAMUSCULAR | Status: DC | PRN
  Administered 2016-04-22: 1 mg via INTRAVENOUS
  Administered 2016-04-23 – 2016-04-24 (×5): 2 mg via INTRAVENOUS
  Administered 2016-04-24: 4 mg via INTRAVENOUS
  Administered 2016-04-24 – 2016-04-27 (×10): 2 mg via INTRAVENOUS
  Administered 2016-04-29 (×2): 4 mg via INTRAVENOUS
  Administered 2016-04-29: 2 mg via INTRAVENOUS
  Administered 2016-04-29: 4 mg via INTRAVENOUS
  Administered 2016-04-30 – 2016-05-03 (×5): 2 mg via INTRAVENOUS
  Administered 2016-05-04 – 2016-05-05 (×5): 4 mg via INTRAVENOUS
  Administered 2016-05-06: 2 mg via INTRAVENOUS
  Filled 2016-04-22 (×9): qty 2
  Filled 2016-04-22: qty 4
  Filled 2016-04-22 (×2): qty 2
  Filled 2016-04-22 (×3): qty 4
  Filled 2016-04-22: qty 2
  Filled 2016-04-22: qty 4
  Filled 2016-04-22 (×2): qty 2
  Filled 2016-04-22 (×2): qty 4
  Filled 2016-04-22 (×2): qty 2
  Filled 2016-04-22 (×2): qty 4
  Filled 2016-04-22 (×3): qty 2
  Filled 2016-04-22: qty 4
  Filled 2016-04-22: qty 2
  Filled 2016-04-22: qty 4
  Filled 2016-04-22: qty 2

## 2016-04-22 MED ORDER — DEXTROSE 5 % IV SOLN
1.0000 g | Freq: Two times a day (BID) | INTRAVENOUS | Status: DC
  Administered 2016-04-22 – 2016-04-24 (×5): 1 g via INTRAVENOUS
  Filled 2016-04-22 (×6): qty 1

## 2016-04-22 MED ORDER — POTASSIUM CHLORIDE 20 MEQ/15ML (10%) PO SOLN: 60.0000 meq | Freq: Every day | ORAL | Status: DC

## 2016-04-22 MED ORDER — VANCOMYCIN HCL IN DEXTROSE 750-5 MG/150ML-% IV SOLN
750.0000 mg | Freq: Two times a day (BID) | INTRAVENOUS | Status: DC
  Administered 2016-04-22 – 2016-04-25 (×6): 750 mg via INTRAVENOUS
  Filled 2016-04-22 (×8): qty 150

## 2016-04-22 MED ORDER — FREE WATER
200.0000 mL | Status: DC
  Administered 2016-04-22 – 2016-04-25 (×18): 200 mL

## 2016-04-22 MED ORDER — PERFLUTREN LIPID MICROSPHERE
1.0000 mL | INTRAVENOUS | Status: AC | PRN
  Administered 2016-04-22: 4 mL via INTRAVENOUS
  Filled 2016-04-22: qty 10

## 2016-04-22 MED ORDER — POTASSIUM CHLORIDE 20 MEQ/15ML (10%) PO SOLN
60.0000 meq | Freq: Every day | ORAL | Status: DC
  Administered 2016-04-22: 60 meq
  Filled 2016-04-22 (×2): qty 45

## 2016-04-22 MED ORDER — FENTANYL BOLUS VIA INFUSION
25.0000 ug | INTRAVENOUS | Status: DC | PRN
  Administered 2016-04-23: 50 ug via INTRAVENOUS
  Administered 2016-04-26 – 2016-04-27 (×5): 100 ug via INTRAVENOUS
  Filled 2016-04-22: qty 100

## 2016-04-22 NOTE — Progress Notes (Signed)
PULMONARY / CRITICAL CARE MEDICINE   Name: Clayton [name] MRN: [id number] DOB: 03/02/1956    ADMISSION DATE:  04/13/2016  REFERRING MD:  Rubin PayorPickering (EDP)   CHIEF COMPLAINT:  Sepsis, respiratory failure   HISTORY OF PRESENT ILLNESS:   60yo male SNF resident with hx CHF, CAD s/p remote stent, DM, HTN, Aflutter s/p ablation on coumadin, severe PVD s/p R BKA, being treated for unknown infection at SNF with rocephin.  Presented 10/12 from SNF after being found unresponsive with agonal breaths.  Bagged by EMS en route and intubated on arrival to ER.  In ER was febrile to 104, hypotensive with SBP 70's.  Labs notable for AKI with K 7.1, glucose 500, WBC 36, lactate 3.6.  PCCM consulted for admit.   SUBJECTIVE: No acute events overnight. Patient remains on vasopressors and ventilator. Nurse reports patient sensitive to Versed bolus.   REVIEW OF SYSTEMS:  Unable to obtain given intubation & sedation.   VITAL SIGNS: BP (!) 104/56   Pulse 92   Temp (!) 100.4 F (38 C)   Resp (!) 22   Ht 5\' 10"  (1.778 m)   Wt 220 lb 10.9 oz (100.1 kg)   SpO2 96%   BMI 31.66 kg/m   HEMODYNAMICS:    VENTILATOR SETTINGS: Vent Mode: PRVC FiO2 (%):  [30 %-60 %] 30 % Set Rate:  [22 bmp] 22 bmp Vt Set:  [580 mL] 580 mL PEEP:  [5 cmH20] 5 cmH20 Plateau Pressure:  [23 cmH20-30 cmH20] 23 cmH20  INTAKE / OUTPUT: I/O last 3 completed shifts: In: 10812.5 [I.V.:1752.5; Other:740; NG/GT:510; IV Piggyback:7810] Out: 3002 [Urine:2300; Other:700; Stool:2]  PHYSICAL EXAMINATION: General:  Obese male. No distress. No family at bedside.  Neuro:  Eyes open. Not following commands. Seems to attend to voice. HEENT:  Purulent secretions in sub-glottic suctioning. ETT in place. No scleral icterus.  Cardiovascular:  Regular rate & rhythm. No appreciable JVD. Lungs:  Coarse breath sounds bilaterally. Symmetric chest rise on ventilator.  Abdomen:  Soft. Protuberant. Hypoactive bowel sounds. Musculoskeletal:  Warm and dry.  Amputations to bilateral lower extremities.  LABS:  BMET  Recent Labs Lab 05/07/2016 2230 04/22/16 0434 04/22/16 0919  NA 141 147* 147*  K 4.2 3.0* 3.7  CL 102 108 108  CO2 24 28 28   BUN 93* 80* 72*  CREATININE 3.65* 2.24* 1.84*  GLUCOSE 503* 188* 145*    Electrolytes  Recent Labs Lab 05/10/2016 0937  04/10/2016 2230 04/22/16 0434 04/22/16 0441 04/22/16 0919  CALCIUM 7.4*  < > 8.1* 8.4*  --  8.3*  MG 2.7*  --   --   --  2.5*  --   PHOS 5.8*  --   --   --  2.1*  --   < > = values in this interval not displayed.  CBC  Recent Labs Lab 04/22/2016 0737 04/29/2016 0743 04/24/2016 0937 04/22/16 0441  WBC 36.2*  --  34.2* 19.4*  HGB 12.2* 13.6 11.3* 10.2*  HCT 39.0 40.0 36.3* 31.8*  PLT 425*  --  422* 304    Coag's  Recent Labs Lab 04/28/2016 0937  APTT 34  INR 1.76    Sepsis Markers  Recent Labs Lab 04/10/2016 0937  05/06/2016 2059 04/22/16 0441 04/22/16 0900  LATICACIDVEN 4.0*  < > 4.9* 2.5* 3.5*  PROCALCITON 1.97  --   --  24.88  --   < > = values in this interval not displayed.  ABG  Recent Labs Lab 05/07/2016 1017 04/22/16 0803  PHART 7.270* 7.424  PCO2ART 52.6* 40.0  PO2ART 251.0* 75.0*    Liver Enzymes  Recent Labs Lab 2016/05/21 0737 2016/05/21 0937  AST 32 30  ALT 40 34  ALKPHOS 91 83  BILITOT 1.1 1.6*  ALBUMIN 3.5 2.9*    Cardiac Enzymes  Recent Labs Lab 2016/05/21 0937 May 21, 2016 1510 05/21/16 2050  TROPONINI 0.06* 0.06* 0.07*    Glucose  Recent Labs Lab 04/22/16 0542 04/22/16 0647 04/22/16 0801 04/22/16 0907 04/22/16 1015 04/22/16 1126  GLUCAP 138* 120* 151* 144* 182* 164*    Imaging No results found.   STUDIES:  Renal U/S 10/12:  No abnormality or hydronephrosis.  2D Echo >>  MICROBIOLOGY: MRSA PCR 10/12:  Negative Blood Ctx x2 10/12 >> Urine Ctx 10/12 >> Tracheal Asp Ctx 10/13 >>  ANTIBIOTICS: Zosyn 10/12 x1 dose Vancomycin 10/12 >> Cefepime 10/12 >>  SIGNIFICANT EVENTS: 10/12 - Admit & intubation in  ED  LINES/TUBES: OETT 7.5 10/12 >>  RUE PICC (SNF) 10/11 >>   Foley 10/12 >> PEG >> PIV x1  ASSESSMENT / PLAN:  INFECTIOUS A: Severe Sepsis Possible UTI Probable Acute Bronchitis  P:   Empiric Vancomycin & Cefepime Day #2 Awaiting cultures Checking Tracheal Aspirate Culture & Respiratory Viral Panel today Trending Procalcitonin per algorithm  PULMONARY A: Acute Hypoxic Respiratory Failure - Questionable etiology. No opacity on CXR. Improving.  Possible Acute Bronchitis  P:   Full Vent Support  Holding on SBT until patient stabilizes Intermittent CXR & ABG Albuterol nebs prn  CARDIOVASCULAR A: Shock - Sepsis versus Cardiogenic. Elevated Troponin I - Likely demand ischemia. SVT - Questionable Atrial flutter. H/O CAD w/ Stent H/O CHF - Dilated LV w/ decreased function November 2016. H/O A Flutter - S/P ablation.  P:  Continuous telemetry monitoring Vitals per unit protocol Awaiting TTE result Heparin drip per pharmacy protocol once INR <2.0 Holding home lasix, metoprolol, lisinopril, & eliquis  Levophed to maintain MAP >65 & SBP >90 ASA 81mg  VT daily D/C Vasopressin  RENAL A: Acute Renal Failure - Improving. Likely due to hypovolemia/shock. Hypernatremia - Mild. Hypophosphatemia - Mild.  Hypokalemia - Resolved. Lactic Acidosis - Increasing.  AGMA - Resolved.   P:   Monitoring UOP with Foley Trending Electrolytes and Renal function Trending LA q6hr Free Water 200cc VT q4hr Replacing electrolytes as indicated  GASTROINTESTINAL A: No active issue.  P:   NPO  Continuing tube feedings Protonix VT daily  HEMATOLOGIC A: Leukocytosis - Improving. Due to sepsis. Anemia - Chronic. Hgb stable. No signs of active bleeding. Coagulopathy - Possibly secondary to Eliquis vs Coumadin.  P:  Trending cell counts daily w/ CBC Heparin drip per pharmacy protocol  ENDOCRINE A: H/O DM - A1c 10.6 in June 2017. Glucose controlled now.  P:   Insulin  drip Accu-Checks q1hr  NEUROLOGIC A: Acute Encephalopathy - Multifactorial from hypoxia and probable toxic metabolic. Sedation on Ventilator  P:   RASS goal: 0 to -1 Fentanyl gtt & IV prn pain & discomfort Versed IV prn sedation   FAMILY  - Updates: No family at bedside 10/13.   - Inter-disciplinary family meet or Palliative Care meeting due by:  10/19  TODAY'S SUMMARY:  60 y.o. male with multiple medical problems. Presenting with septic shock. Source likely UTI. Weaning vasopressor support. Continuing to trend lactic acidosis. Continuing insulin drip for glucose control.   I have spent a total of 40 minutes of critical care time today caring for the patient and reviewing the patient's electronic medical record.  Donna Christen Jamison Neighbor, M.D. [facility] Pulmonary & Critical Care Pager:  [phone] After 3pm or if no response, call [phone] 04/22/2016  12:03 PM

## 2016-04-22 NOTE — Progress Notes (Signed)
PHARMACY - PHYSICIAN COMMUNICATION CRITICAL VALUE ALERT - BLOOD CULTURE IDENTIFICATION (BCID)  Results for orders placed or performed during the hospital encounter of 2016/03/13  Blood Culture ID Panel (Reflexed) (Collected: 02-14-2016  7:35 AM)  Result Value Ref Range   Enterococcus species NOT DETECTED NOT DETECTED   Listeria monocytogenes NOT DETECTED NOT DETECTED   Staphylococcus species DETECTED (A) NOT DETECTED   Staphylococcus aureus NOT DETECTED NOT DETECTED   Methicillin resistance DETECTED (A) NOT DETECTED   Streptococcus species NOT DETECTED NOT DETECTED   Streptococcus agalactiae NOT DETECTED NOT DETECTED   Streptococcus pneumoniae NOT DETECTED NOT DETECTED   Streptococcus pyogenes NOT DETECTED NOT DETECTED   Acinetobacter baumannii NOT DETECTED NOT DETECTED   Enterobacteriaceae species NOT DETECTED NOT DETECTED   Enterobacter cloacae complex NOT DETECTED NOT DETECTED   Escherichia coli NOT DETECTED NOT DETECTED   Klebsiella oxytoca NOT DETECTED NOT DETECTED   Klebsiella pneumoniae NOT DETECTED NOT DETECTED   Proteus species NOT DETECTED NOT DETECTED   Serratia marcescens NOT DETECTED NOT DETECTED   Haemophilus influenzae NOT DETECTED NOT DETECTED   Neisseria meningitidis NOT DETECTED NOT DETECTED   Pseudomonas aeruginosa NOT DETECTED NOT DETECTED   Candida albicans NOT DETECTED NOT DETECTED   Candida glabrata NOT DETECTED NOT DETECTED   Candida krusei NOT DETECTED NOT DETECTED   Candida parapsilosis NOT DETECTED NOT DETECTED   Candida tropicalis NOT DETECTED NOT DETECTED    Name of physician (or Provider) Contacted: Kandis MannanKanishka Gunadasa  Changes to prescribed antibiotics required: None required at this time, patient is on vancomycin. Discussed the possibility that this may be a contaminate as it is only in 1 of 2 cultures and it is likely CoNS.  Fredonia HighlandMichael Bitonti, PharmD PGY-1 Pharmacy Resident Pager: [phone] 04/22/2016

## 2016-04-22 NOTE — Progress Notes (Signed)
Pharmacy Antibiotic Note - Vancomycin & Cefepime  Clayton [name] is a 60 y.o. male admitted on September 18, 2015 with sepsis.  Pharmacy has been consulted for vancomycin and cefepime dosing. Received a loading dose of vancomycin 2 gm in ED. Remains febrile at 101.5 and WBC declined to 19.4. Lactic acid remains elevated at 3.5. AKI is resolving and SCr has improved from 5.67 to 1.84 (CrCl 49 ml/min).  Plan: - Adjust vancomycin 750 mg q12h - goal trough 15-20 - Adjust cefepime 1 gm IV q12h - Follow-up renal function, C&S, clinical status and trough at SS  Height: 5\' 10"  (177.8 cm) Weight: 220 lb 10.9 oz (100.1 kg) IBW/kg (Calculated) : 73  Temp (24hrs), Avg:100.9 F (38.3 C), Min:97.3 F (36.3 C), Max:102 F (38.9 C)   Recent Labs Lab 28-Aug-2015 0737  28-Aug-2015 0937 28-Aug-2015 1200 28-Aug-2015 1203 28-Aug-2015 1500 28-Aug-2015 1510 28-Aug-2015 2059 28-Aug-2015 2230 04/22/16 0434 04/22/16 0441 04/22/16 0900 04/22/16 0919  WBC 36.2*  --  34.2*  --   --   --   --   --   --   --  19.4*  --   --   CREATININE 5.81*  < > 5.67* 4.09*  --   --  4.42*  --  3.65* 2.24*  --   --  1.84*  LATICACIDVEN  --   < > 4.0*  --  3.7* 5.2*  --  4.9*  --   --  2.5* 3.5*  --   < > = values in this interval not displayed.  Estimated Creatinine Clearance: 49.3 mL/min (by C-G formula based on SCr of 1.84 mg/dL (H)).    Allergies  Allergen Reactions  . Niaspan [Niacin Er] Hives and Other (See Comments)    Reaction:  Facial redness/hotness     Antimicrobials this admission: Vanc 10/12 >> Cefepime 10/12 >> Zosyn 10/12 x 1  Dose adjustments this admission: N/A  Microbiology results: 10/12 BCx: 1/2 positive Staph species, methicillin resistant  10/12 UCx: No growth 10/12 MRSA PCR: negative 10/13 Respiratory PCR: IP  10/13 Respiratory Cx: IP  Thank you for allowing pharmacy to be a part of this patient's care.  Duane LopeHailey L Dwayne Bulkley, PharmD Candidate 04/22/2016 1:26 PM

## 2016-04-22 NOTE — Progress Notes (Signed)
ANTICOAGULATION CONSULT NOTE - Follow-Up Consult  Pharmacy Consult for heparin Indication: atrial fibrillation  Allergies  Allergen Reactions  . Niaspan [Niacin Er] Hives and Other (See Comments)    Reaction:  Facial redness/hotness    Patient Measurements: Height: 5\' 10"  (177.8 cm) Weight: 220 lb 10.9 oz (100.1 kg) IBW/kg (Calculated) : 73 Heparin Dosing Weight: 97kg  Vital Signs: Temp: 99.7 F (37.6 C) (10/13 0800) BP: 124/65 (10/13 0800) Pulse Rate: 77 (10/13 0800)  Labs:  Recent Labs  04/30/2016 0737 04/18/2016 0743 04/23/2016 0937  04/16/2016 1510 04/11/2016 2050 04/12/2016 2230 04/22/16 0434 04/22/16 0441  HGB 12.2* 13.6 11.3*  --   --   --   --   --  10.2*  HCT 39.0 40.0 36.3*  --   --   --   --   --  31.8*  PLT 425*  --  422*  --   --   --   --   --  304  APTT  --   --  34  --   --   --   --   --   --   LABPROT  --   --  20.7*  --   --   --   --   --   --   INR  --   --  1.76  --   --   --   --   --   --   HEPARINUNFRC  --   --   --   --   --  0.41  --   --  0.38  CREATININE 5.81* 5.60* 5.67*  < > 4.42*  --  3.65* 2.24*  --   TROPONINI  --   --  0.06*  --  0.06* 0.07*  --   --   --   < > = values in this interval not displayed. Estimated Creatinine Clearance: 40.5 mL/min (by C-G formula based on SCr of 2.24 mg/dL (H)).  Medical History: Past Medical History:  Diagnosis Date  . CHF (congestive heart failure) (HCC)   . Coronary artery disease   . CVA (cerebral infarction) 2007  . Diabetes (HCC)   . Diabetes mellitus without complication (HCC)   . Diabetic neuropathy (HCC)   . Diabetic retinopathy (HCC)   . GERD (gastroesophageal reflux disease)   . Hyperlipidemia   . Hypertension   . MI (myocardial infarction)    x2  . PNA (pneumonia)   . Stroke [facility](HCC)     Medications:  Infusions:  . fentaNYL 400 mcg/hr (04/22/16 0600)  . heparin 1,400 Units/hr (04/22/16 0600)  . insulin (NOVOLIN-R) infusion 6.4 Units/hr (04/22/16 0802)  . norepinephrine (LEVOPHED) Adult  infusion 6 mcg/min (04/22/16 0752)  . phenylephrine (NEO-SYNEPHRINE) Adult infusion Stopped (05/10/2016 1600)  . propofol (DIPRIVAN) infusion Stopped (04/19/2016 0915)  . vasopressin (PITRESSIN) infusion - *FOR SHOCK* 0.03 Units/min (04/22/16 0810)    Assessment: [age] yoM presented to the ED unresponsive and admitted as code sepsis. Pt is on coumadin PTA for AFib, INR subtherapeutic on admit and started on heparin. Anti-Xa level currently therapeutic x2 at 0.38. Hgb 10.2, plt 304. No S/Sx bleeding noted.  Goal of Therapy:  Heparin level 0.3-0.7 units/ml Monitor platelets by anticoagulation protocol: Yes   Plan:  - Continue heparin gtt 1400 units/hr --Monitor daily anti-Xa level, CBC, S/Sx bleeding   Fredonia HighlandMichael Latrice Storlie, PharmD PGY-1 Pharmacy Resident Pager: [phone] 04/22/2016

## 2016-04-22 NOTE — Progress Notes (Signed)
eLink Physician-Brief Progress Note Patient Name: Clayton [name] DOB: [birth year] MRN: [id number]   Date of Service  04/22/2016  HPI/Events of Note  Incontinent of stool  eICU Interventions  flexiseal     Intervention Category Minor Interventions: Routine modifications to care plan (e.g. PRN medications for pain, fever)  Max FickleDouglas McQuaid 04/22/2016, 4:06 AM

## 2016-04-22 NOTE — Progress Notes (Signed)
  Echocardiogram 2D Echocardiogram has been performed.  Arvil ChacoFoster, Aaleeyah Bias 04/22/2016, 4:25 PM

## 2016-04-22 NOTE — Progress Notes (Signed)
  Echocardiogram 2D Echocardiogram has been performed.  Clayton [name], Clayton [name] 04/22/2016, 4:24 PM

## 2016-04-22 NOTE — Progress Notes (Signed)
PULMONARY / CRITICAL CARE MEDICINE   Name: Clayton [name] MRN: [id number] DOB: Feb 11, 1956    ADMISSION DATE:  May 09, 2016 CONSULTATION DATE:  May 09, 2016  REFERRING MD:  ED  CHIEF COMPLAINT:  shock  HISTORY OF PRESENT ILLNESS:   61yo male SNF resident with hx CHF, CAD s/p remote stent, DM, HTN, Aflutter s/p ablation on anticoagulation, severe PVD s/p R BKA, being treated for unknown infection at SNF with rocephin.  Presented 10/12 from SNF after being found unresponsive with agonal breaths.  Bagged by EMS en route and intubated on arrival to ER.  In ER was febrile to 104, hypotensive with SBP 70's.  Labs notable for AKI with K 7.1, glucose 500, WBC 36, lactate 3.6.  PCCM admitted on 10/12.   PAST MEDICAL HISTORY :  He  has a past medical history of CHF (congestive heart failure) (HCC); Coronary artery disease; CVA (cerebral infarction) (2007); Diabetes (HCC); Diabetes mellitus without complication (HCC); Diabetic neuropathy (HCC); Diabetic retinopathy (HCC); GERD (gastroesophageal reflux disease); Hyperlipidemia; Hypertension; MI (myocardial infarction); PNA (pneumonia); and Stroke (HCC).  PAST SURGICAL HISTORY: He  has a past surgical history that includes Below knee leg amputation (Left); Appendectomy; Percutaneous placement intravascular stent cervical carotid artery (Right); and Cardiac catheterization (N/A, 08/03/2015).  Allergies  Allergen Reactions  . Niaspan [Niacin Er] Hives and Other (See Comments)    Reaction:  Facial redness/hotness     No current facility-administered medications on file prior to encounter.    Current Outpatient Prescriptions on File Prior to Encounter  Medication Sig  . albuterol (PROVENTIL HFA;VENTOLIN HFA) 108 (90 BASE) MCG/ACT inhaler Inhale 1-2 puffs into the lungs every 6 (six) hours as needed for wheezing or shortness of breath.  Marland Kitchen atorvastatin (LIPITOR) 40 MG tablet Take 40 mg by mouth daily.  . cholecalciferol (VITAMIN D) 1000 UNITS tablet Take 1,000  Units by mouth daily.   . insulin aspart (NOVOLOG) 100 UNIT/ML injection Inject 0-15 Units into the skin 3 (three) times daily with meals as needed for high blood sugar. Pt uses as needed per sliding scale:   Less than 120:  0 units 120-180:  2 units 181-220:4 units 221-260:  6 units 261-300:  8 units 301-350:  10 units 351-400:  12 units Greater than 400:  Call MD  . insulin detemir (LEVEMIR) 100 UNIT/ML injection Inject 0.15 mLs (15 Units total) into the skin at bedtime.  Marland Kitchen apixaban (ELIQUIS) 5 MG TABS tablet Take 1 tablet (5 mg total) by mouth 2 (two) times daily. (Patient not taking: Reported on 05-09-2016)    FAMILY HISTORY:  His indicated that his mother is alive. He indicated that his father is deceased. He indicated that both of his sisters are alive.    SOCIAL HISTORY: He  reports that he has quit smoking. He has never used smokeless tobacco. He reports that he does not drink alcohol or use drugs.  REVIEW OF SYSTEMS:   Unable to obtain as patient is intubated.   SUBJECTIVE:  Patient intubated. Able to follow commands.   VITAL SIGNS: BP 124/65   Pulse 77   Temp 99.7 F (37.6 C)   Resp (!) 22   Ht 5\' 10"  (1.778 m)   Wt 100.1 kg (220 lb 10.9 oz)   SpO2 99%   BMI 31.66 kg/m   HEMODYNAMICS:    VENTILATOR SETTINGS: Vent Mode: PRVC FiO2 (%):  [30 %-100 %] 30 % Set Rate:  [22 bmp] 22 bmp Vt Set:  [580 mL] 580 mL PEEP:  [  5 cmH20] 5 cmH20 Plateau Pressure:  [26 cmH20-34 cmH20] 30 cmH20  INTAKE / OUTPUT: I/O last 3 completed shifts: In: 10812.5 [I.V.:1752.5; Other:740; NG/GT:510; IV Piggyback:7810] Out: 3002 [Urine:2300; Other:700; Stool:2]  PHYSICAL EXAMINATION: GEN: NAD, intubated, but awake and follows commands  CV: RRR, no murmurs, rubs, or gallops PULM: ETT, coarse breath sounds bilaterally, normal effort  ABD: Soft, nontender, nondistended, PEG tube- mild surrounding erythema/minimal discharge SKIN: No rash or cyanosis; warm and well-perfused EXTR: R  BKAand L AKA  LABS:  BMET  Recent Labs Lab 04/10/2016 1510 04/10/2016 2230 04/22/16 0434  NA 138 141 147*  K 6.0* 4.2 3.0*  CL 101 102 108  CO2 22 24 28   BUN 96* 93* 80*  CREATININE 4.42* 3.65* 2.24*  GLUCOSE 718* 503* 188*    Electrolytes  Recent Labs Lab 04/10/2016 0937  05/02/2016 1510 04/24/2016 2230 04/22/16 0434 04/22/16 0441  CALCIUM 7.4*  < > 7.6* 8.1* 8.4*  --   MG 2.7*  --   --   --   --  2.5*  PHOS 5.8*  --   --   --   --  2.1*  < > = values in this interval not displayed.  CBC  Recent Labs Lab 05/09/2016 0737 04/25/2016 0743 05/07/2016 0937 04/22/16 0441  WBC 36.2*  --  34.2* 19.4*  HGB 12.2* 13.6 11.3* 10.2*  HCT 39.0 40.0 36.3* 31.8*  PLT 425*  --  422* 304    Coag's  Recent Labs Lab 04/19/2016 0937  APTT 34  INR 1.76    Sepsis Markers  Recent Labs Lab 05/09/2016 0937  04/24/2016 1500 05/06/2016 2059 04/22/16 0441  LATICACIDVEN 4.0*  < > 5.2* 4.9* 2.5*  PROCALCITON 1.97  --   --   --  24.88  < > = values in this interval not displayed.  ABG  Recent Labs Lab 04/22/2016 1017 04/22/16 0803  PHART 7.270* 7.424  PCO2ART 52.6* 40.0  PO2ART 251.0* 75.0*    Liver Enzymes  Recent Labs Lab 04/18/2016 0737 04/22/2016 0937  AST 32 30  ALT 40 34  ALKPHOS 91 83  BILITOT 1.1 1.6*  ALBUMIN 3.5 2.9*    Cardiac Enzymes  Recent Labs Lab 05/05/2016 0937 04/10/2016 1510 05/03/2016 2050  TROPONINI 0.06* 0.06* 0.07*    Glucose  Recent Labs Lab 04/22/16 0140 04/22/16 0243 04/22/16 0421 04/22/16 0542 04/22/16 0647 04/22/16 0801  GLUCAP 307* 256* 185* 138* 120* 151*    Imaging US Renal  Result Date: 05/05/2016 CLINICAL DATA:  Sepsis. EXAM: RENAL / URINARY TRACT ULTRASOUND COMPLETE COMPARISON:  Renal ultrasound of January 07, 2016. FINDINGS: Right Kidney: Length: 13.8 cm. Echogenicity within normal limits. No mass or hydronephrosis visualized. Left Kidney: Length: 13.4 cm. Echogenicity within normal limits. No mass or hydronephrosis visualized.  Bladder: Decompressed secondary to Foley catheter. IMPRESSION: No definite renal abnormality seen. Electronically Signed   By: Lupita Raider, M.D.   On: 04/20/2016 11:28   Dg Chest Port 1 View  Result Date: 04/18/2016 CLINICAL DATA:  Hypoxia.  Fever EXAM: PORTABLE CHEST 1 VIEW COMPARISON:  April 21, 2016 study obtained earlier in the day FINDINGS: Endotracheal tube tip is 3.3 cm above the carina. Central catheter tip is at the junction of the right innominate vein and superior vena cava. No pneumothorax. There is mild bibasilar atelectasis. Lungs elsewhere are clear. Heart is borderline enlarged with pulmonary vascularity within normal limits. No adenopathy. Patient is status post coronary artery bypass grafting. IMPRESSION: Tube and catheter positions as  described without pneumothorax. Bibasilar atelectasis. No edema or consolidation. Stable cardiac silhouette. Electronically Signed   By: Bretta BangWilliam  Woodruff III M.D.   On: 04/23/16 09:25     STUDIES:  CXR 10/12: no edema or consolidation  Renal u/s 10/12>>> normal  Echo 10/12>>>  CULTURES: BC 10/12>>> PCR with staph and methicillin resistance but negative for staph aureus  Urine 10/12>>> Sputum 10/12>>>  ANTIBIOTICS: Vanc 10/12 >  Zosyn 10/12 > 10/12 Cefepime 10/12 >  SIGNIFICANT EVENTS: 10/12: admit  LINES/TUBES: ETT 10/12 > PEG tube Rectal Tube 10/12 > Urethral Catheter 10/2 > PICC RUE 10/11 > PIV x 1   ASSESSMENT / PLAN: 60yo male SNF resident with hx CHF, CAD s/p remote stent, DM, HTN, Aflutter s/p ablation on anticoagulation, severe PVD s/p R BKA, being treated for unknown infection at SNF with rocephin presented with what is likely septic shock requiring pressor and acute respiratory failure possibly due to HCAP.UTI also considered as another source.   PULMONARY Acute respiratory failure:  In the setting of what is likely septic shock  Possible HCAP: although CXR does not show consolidation  P:   Continue vent  support  F/u ABG this AM  Sputum culture  Daily SBT  PRN BD  See ID   CARDIOVASCULAR Septic shock - possible HCAP vs UTI  Hx CAD: troponin stable, likely demand in the setting of sepsis  Hx CHF  A Flutter s/p ablation on coumadin  P:  Hold home lasix, metoprolol, lisinopril, eliquis  Heparin gtt per pharmacy ASA  Levophed - titrate for MAP >65 Vasopressin  ECHO ordered   RENAL Presumed UTI  AKI, improving with good urine output: normal Renal US Hyperkalemia resolved now Hypokalemia Hypophosphatemia Hypernatremia, mild  AG metabolic acidosis: resolved   P:   Kdur 60mEq once   Follow chemistries q 6 hr Replete electrolytes when indicated Consider free water flushes 250cc q 4 hrs for 24 hours to start    GASTROINTESTINAL No active issue  P:   NPO  PPI  Tube Feeds   HEMATOLOGIC Leukocytosis  Anemia - chronic  P:  F/u CBC  Heparin gtt   INFECTIOUS Septic shock Possible UTI  Decub - doubt source of sepsis  Possible HCAP  P:   Cefepime and Vanc (Day #2)  Follow culture  ICU procalcitonin algorithm  Trend lactate   ENDOCRINE DM: A1c 10/6 (12/2015). CBGs improving P:   Continue insulin drip   NEUROLOGIC AMS - r/t septic shock, respiratory failure  P:   RASS goal: -1 Fentanyl gtt  PRN versed and Fentanyl    FAMILY  - Updates: no family available 10/12, SNF to alert family   - Inter-disciplinary family meet or Palliative Care meeting due by:  10/19 Palma HolterKanishka G Gunadasa, MD PGY 2 Family Medicine   04/22/2016, 8:21 AM

## 2016-04-22 NOTE — Progress Notes (Signed)
CRITICAL VALUE ALERT  Critical value received:  Lactic Acid 3.5  Date of notification:  04/22/16  Time of notification:  1000  Critical value read back:Yes.    Nurse who received alert:  Layne BentonJulian Dede Dobesh, RN  MD notified (1st page):  Dr. Jamison NeighborNestor  Time of first page:  1005  MD notified (2nd page):  Time of second page:  Responding MD:  Dr. Jamison NeighborNestor  Time MD responded:  1005

## 2016-04-23 DIAGNOSIS — R739 Hyperglycemia, unspecified: Secondary | ICD-10-CM

## 2016-04-23 LAB — GLUCOSE, CAPILLARY
GLUCOSE-CAPILLARY: 203 mg/dL — AB (ref 65–99)
GLUCOSE-CAPILLARY: 143 mg/dL — AB (ref 65–99)
GLUCOSE-CAPILLARY: 130 mg/dL — AB (ref 65–99)
GLUCOSE-CAPILLARY: 179 mg/dL — AB (ref 65–99)
GLUCOSE-CAPILLARY: 221 mg/dL — AB (ref 65–99)
GLUCOSE-CAPILLARY: 224 mg/dL — AB (ref 65–99)
Glucose-Capillary: 115 mg/dL — ABNORMAL HIGH (ref 65–99)
Glucose-Capillary: 147 mg/dL — ABNORMAL HIGH (ref 65–99)
Glucose-Capillary: 159 mg/dL — ABNORMAL HIGH (ref 65–99)
Glucose-Capillary: 160 mg/dL — ABNORMAL HIGH (ref 65–99)
Glucose-Capillary: 166 mg/dL — ABNORMAL HIGH (ref 65–99)
Glucose-Capillary: 168 mg/dL — ABNORMAL HIGH (ref 65–99)
Glucose-Capillary: 207 mg/dL — ABNORMAL HIGH (ref 65–99)
Glucose-Capillary: 214 mg/dL — ABNORMAL HIGH (ref 65–99)

## 2016-04-23 LAB — BASIC METABOLIC PANEL
Anion gap: 9 (ref 5–15)
BUN: 56 mg/dL — AB (ref 6–20)
CHLORIDE: 107 mmol/L (ref 101–111)
CO2: 28 mmol/L (ref 22–32)
Calcium: 8 mg/dL — ABNORMAL LOW (ref 8.9–10.3)
Creatinine, Ser: 1.07 mg/dL (ref 0.61–1.24)
GFR calc non Af Amer: >60 mL/min (ref >60)
Glucose, Bld: 161 mg/dL — ABNORMAL HIGH (ref 65–99)
POTASSIUM: 3.1 mmol/L — AB (ref 3.5–5.1)
SODIUM: 144 mmol/L (ref 135–145)

## 2016-04-23 LAB — RENAL FUNCTION PANEL
ANION GAP: 9 (ref 5–15)
Albumin: 2.3 g/dL — ABNORMAL LOW (ref 3.5–5.0)
BUN: 56 mg/dL — ABNORMAL HIGH (ref 6–20)
CHLORIDE: 108 mmol/L (ref 101–111)
CO2: 28 mmol/L (ref 22–32)
Calcium: 8 mg/dL — ABNORMAL LOW (ref 8.9–10.3)
Creatinine, Ser: 1.08 mg/dL (ref 0.61–1.24)
GFR calc non Af Amer: >60 mL/min (ref >60)
Glucose, Bld: 174 mg/dL — ABNORMAL HIGH (ref 65–99)
POTASSIUM: 3 mmol/L — AB (ref 3.5–5.1)
Phosphorus: 1.9 mg/dL — ABNORMAL LOW (ref 2.5–4.6)
Sodium: 145 mmol/L (ref 135–145)

## 2016-04-23 LAB — CBC WITH DIFFERENTIAL/PLATELET
Basophils Absolute: 0 10*3/uL (ref 0.0–0.1)
Basophils Relative: 0 %
Eosinophils Absolute: 0.1 10*3/uL (ref 0.0–0.7)
Eosinophils Relative: 1 %
HCT: 28.1 % — ABNORMAL LOW (ref 39.0–52.0)
HEMOGLOBIN: 8.8 g/dL — AB (ref 13.0–17.0)
LYMPHS ABS: 1.3 10*3/uL (ref 0.7–4.0)
LYMPHS PCT: 10 %
MCH: 27.6 pg (ref 26.0–34.0)
MCHC: 31.3 g/dL (ref 30.0–36.0)
MCV: 88.1 fL (ref 78.0–100.0)
MONOS PCT: 4 %
Monocytes Absolute: 0.5 10*3/uL (ref 0.1–1.0)
NEUTROS PCT: 85 %
Neutro Abs: 10.8 10*3/uL — ABNORMAL HIGH (ref 1.7–7.7)
Platelets: 192 10*3/uL (ref 150–400)
RBC: 3.19 MIL/uL — AB (ref 4.22–5.81)
RDW: 19.7 % — ABNORMAL HIGH (ref 11.5–15.5)
WBC: 12.6 10*3/uL — AB (ref 4.0–10.5)

## 2016-04-23 LAB — PROCALCITONIN: Procalcitonin: 14.94 ng/mL

## 2016-04-23 LAB — HEMOGLOBIN AND HEMATOCRIT, BLOOD
HCT: 26.3 % — ABNORMAL LOW (ref 39.0–52.0)
HEMOGLOBIN: 8 g/dL — AB (ref 13.0–17.0)

## 2016-04-23 LAB — HEPARIN LEVEL (UNFRACTIONATED)
Heparin Unfractionated: 0.21 IU/mL — ABNORMAL LOW (ref 0.30–0.70)
Heparin Unfractionated: 0.12 IU/mL — ABNORMAL LOW (ref 0.30–0.70)

## 2016-04-23 LAB — LACTIC ACID, PLASMA: LACTIC ACID, VENOUS: 1.9 mmol/L (ref 0.5–1.9)

## 2016-04-23 LAB — TRIGLYCERIDES: Triglycerides: 183 mg/dL — ABNORMAL HIGH (ref <150)

## 2016-04-23 LAB — MAGNESIUM: Magnesium: 2.2 mg/dL (ref 1.7–2.4)

## 2016-04-23 MED ORDER — POTASSIUM CHLORIDE 20 MEQ/15ML (10%) PO SOLN
30.0000 meq | ORAL | Status: AC
  Administered 2016-04-23 (×2): 30 meq
  Filled 2016-04-23 (×2): qty 30

## 2016-04-23 MED ORDER — POTASSIUM & SODIUM PHOSPHATES 280-160-250 MG PO PACK
2.0000 | PACK | Freq: Three times a day (TID) | ORAL | Status: DC
  Filled 2016-04-23: qty 2

## 2016-04-23 MED ORDER — INSULIN NPH (HUMAN) (ISOPHANE) 100 UNIT/ML ~~LOC~~ SUSP
15.0000 [IU] | Freq: Two times a day (BID) | SUBCUTANEOUS | Status: DC
  Administered 2016-04-23 (×2): 15 [IU] via SUBCUTANEOUS
  Filled 2016-04-23: qty 10

## 2016-04-23 MED ORDER — SODIUM CHLORIDE 0.9 % IV SOLN
INTRAVENOUS | Status: DC
  Administered 2016-04-24: 21:00:00 via INTRAVENOUS

## 2016-04-23 MED ORDER — PHENYLEPHRINE HCL 10 MG/ML IJ SOLN
0.0000 ug/min | INTRAVENOUS | Status: DC
  Administered 2016-04-23: 20 ug/min via INTRAVENOUS
  Filled 2016-04-23: qty 1

## 2016-04-23 MED ORDER — INSULIN ASPART 100 UNIT/ML ~~LOC~~ SOLN
0.0000 [IU] | SUBCUTANEOUS | Status: DC
  Administered 2016-04-23 (×3): 5 [IU] via SUBCUTANEOUS
  Administered 2016-04-24 (×2): 3 [IU] via SUBCUTANEOUS
  Administered 2016-04-24: 5 [IU] via SUBCUTANEOUS
  Administered 2016-04-24: 3 [IU] via SUBCUTANEOUS
  Administered 2016-04-24 – 2016-04-25 (×5): 5 [IU] via SUBCUTANEOUS
  Administered 2016-04-25: 8 [IU] via SUBCUTANEOUS
  Administered 2016-04-25 – 2016-04-26 (×3): 5 [IU] via SUBCUTANEOUS
  Administered 2016-04-26: 2 [IU] via SUBCUTANEOUS
  Administered 2016-04-26: 3 [IU] via SUBCUTANEOUS
  Administered 2016-04-26: 5 [IU] via SUBCUTANEOUS
  Administered 2016-04-26: 3 [IU] via SUBCUTANEOUS
  Administered 2016-04-27 – 2016-04-28 (×6): 2 [IU] via SUBCUTANEOUS
  Administered 2016-04-28: 3 [IU] via SUBCUTANEOUS
  Administered 2016-04-28 – 2016-04-29 (×4): 5 [IU] via SUBCUTANEOUS

## 2016-04-23 MED ORDER — POTASSIUM & SODIUM PHOSPHATES 280-160-250 MG PO PACK
2.0000 | PACK | Freq: Three times a day (TID) | ORAL | Status: AC
Start: 2016-04-23 — End: 2016-04-23
  Administered 2016-04-23 (×3): 2 via ORAL
  Filled 2016-04-23 (×3): qty 2

## 2016-04-23 NOTE — Progress Notes (Signed)
ANTICOAGULATION CONSULT NOTE  Pharmacy Consult for heparin Indication: atrial fibrillation  Allergies  Allergen Reactions  . Niaspan [Niacin Er] Hives and Other (See Comments)    Reaction:  Facial redness/hotness    Patient Measurements: Height: 5\' 10"  (177.8 cm) Weight: 225 lb 8.5 oz (102.3 kg) IBW/kg (Calculated) : 73 Heparin Dosing Weight: 97kg  Vital Signs: Temp: 99.5 F (37.5 C) (10/14 1200) Temp Source: Core (Comment) (10/14 1200) BP: 119/68 (10/14 1200) Pulse Rate: 116 (10/14 1200)  Labs:  Recent Labs  04/23/2016 0937  04/25/2016 1510  04/18/2016 2050  04/22/16 0441 04/22/16 0919 04/23/16 0343 04/23/16 1017 04/23/16 1230  HGB 11.3*  --   --   --   --   --  10.2*  --  8.8*  --   --   HCT 36.3*  --   --   --   --   --  31.8*  --  28.1*  --   --   PLT 422*  --   --   --   --   --  304  --  192  --   --   APTT 34  --   --   --   --   --   --   --   --   --   --   LABPROT 20.7*  --   --   --   --   --   --   --   --   --   --   INR 1.76  --   --   --   --   --   --   --   --   --   --   HEPARINUNFRC  --   --   --   < > 0.41  --  0.38  --  0.21*  --  0.12*  CREATININE 5.67*  < > 4.42*  --   --   < >  --  1.84* 1.08 1.07  --   TROPONINI 0.06*  --  0.06*  --  0.07*  --   --   --   --   --   --   < > = values in this interval not displayed. Estimated Creatinine Clearance: 85.8 mL/min (by C-G formula based on SCr of 1.07 mg/dL).  Assessment: 60 y.o. male with Afib on heparin gtt. Pt was on Eliquis prior to admission but it is unclear if he was actually taking this medication. His heparin level continues to decline. It was SUBtherapeutic again this afternoon despite a rate increase this morning. His hemoglobin and platelets have also both decline since admission - MD aware. Will reassess cbc this afternoon.   Goal of Therapy:  Heparin level 0.3-0.7 units/ml Monitor platelets by anticoagulation protocol: Yes    Plan:  -Increase Heparin 1700 units/hr -Daily HL, CBC -F/u  repeat CBC this afternoon     Agapito GamesAlison Coreena Rubalcava, PharmD, BCPS Clinical Pharmacist 04/23/2016 1:16 PM

## 2016-04-23 NOTE — Progress Notes (Signed)
ANTICOAGULATION CONSULT NOTE - Follow-Up Consult  Pharmacy Consult for heparin Indication: atrial fibrillation  Allergies  Allergen Reactions  . Niaspan [Niacin Er] Hives and Other (See Comments)    Reaction:  Facial redness/hotness    Patient Measurements: Height: 5\' 10"  (177.8 cm) Weight: 225 lb 8.5 oz (102.3 kg) IBW/kg (Calculated) : 73 Heparin Dosing Weight: 97kg  Vital Signs: Temp: 100.6 F (38.1 C) (10/14 0200) Temp Source: Core (Comment) (10/13 2353) BP: 94/60 (10/14 0200) Pulse Rate: 94 (10/14 0303)  Labs:  Recent Labs  05/05/2016 0937  05/05/2016 1510 05/05/2016 2050  04/22/16 0434 04/22/16 0441 04/22/16 0919 04/23/16 0343  HGB 11.3*  --   --   --   --   --  10.2*  --  8.8*  HCT 36.3*  --   --   --   --   --  31.8*  --  28.1*  PLT 422*  --   --   --   --   --  304  --  192  APTT 34  --   --   --   --   --   --   --   --   LABPROT 20.7*  --   --   --   --   --   --   --   --   INR 1.76  --   --   --   --   --   --   --   --   HEPARINUNFRC  --   --   --  0.41  --   --  0.38  --  0.21*  CREATININE 5.67*  < > 4.42*  --   < > 2.24*  --  1.84* 1.08  TROPONINI 0.06*  --  0.06* 0.07*  --   --   --   --   --   < > = values in this interval not displayed. Estimated Creatinine Clearance: 85 mL/min (by C-G formula based on SCr of 1.08 mg/dL).  Assessment: 60 y.o. male with Afib for heparin  Goal of Therapy:  Heparin level 0.3-0.7 units/ml Monitor platelets by anticoagulation protocol: Yes   Plan:  -Increase Heparin 1500 units/hr Check heparin level in 8 hours.  Geannie RisenGreg Kodiak Rollyson, PharmD, BCPS 04/23/2016 4:27 AM

## 2016-04-23 NOTE — Progress Notes (Signed)
[facility] FOR [facility] LAB REPLACEMENT ONLY  The patient does [facility] the [facility] based on the criteria listed below:   1. Is GFR >/= 40 ml/min? Yes.    Patient's GFR today is >60 2. Is urine output >/= 0.5 ml/kg/hr for the last 6 hours? Yes.   Patient's UOP is 0.53 ml/kg/hr 3. Is BUN < 60 mg/dL? Yes.    Patient's BUN today is 56 4. Abnormal electrolyte(s):  K - 3.0 5. Ordered repletion with: PER PROTOCOL 6. If a panic level lab has been reported, has the CCM MD in charge been notified? Yes.  .   Physician:  Dr. Mackie PaiSmith  Sylas Twombly C Madellyn Denio 04/23/2016 4:30 AM

## 2016-04-23 NOTE — Progress Notes (Signed)
eLink Physician-Brief Progress Note Patient Name: Clayton [name] DOB: [birth year] MRN: [id number]   Date of Service  04/23/2016  HPI/Events of Note  hgb trending down w/o signs of bleeding.  On heparin drip for afib.  eICU Interventions  Hold heparin drip and repeat hgb in 6 hrs     Intervention Category Minor Interventions: OtherHenry [name]:  Clayton [name], Demetrius Charity 04/23/2016, 5:38 PM

## 2016-04-23 NOTE — Progress Notes (Signed)
PULMONARY / CRITICAL CARE MEDICINE   Name: Clayton [name] MRN: [id number] DOB: 1956-05-27    ADMISSION DATE:  04/25/2016 CONSULTATION DATE:  05/07/2016  REFERRING MD:  ED  CHIEF COMPLAINT:  shock  HISTORY OF PRESENT ILLNESS:   60yo male SNF resident with hx CHF, CAD s/p remote stent, DM, HTN, Aflutter s/p ablation on anticoagulation, Afib,  severe PVD s/p R BKA, being treated for unknown infection at SNF with rocephin.  Presented 10/12 from SNF after being found unresponsive with agonal breaths.  Bagged by EMS en route and intubated on arrival to ER.  In ER was febrile to 104, hypotensive with SBP 70's.  Labs notable for AKI with K 7.1, glucose 500, WBC 36, lactate 3.6.  PCCM admitted on 10/12.   PAST MEDICAL HISTORY :  He  has a past medical history of CHF (congestive heart failure) (HCC); Coronary artery disease; CVA (cerebral infarction) (2007); Diabetes (HCC); Diabetes mellitus without complication (HCC); Diabetic neuropathy (HCC); Diabetic retinopathy (HCC); GERD (gastroesophageal reflux disease); Hyperlipidemia; Hypertension; MI (myocardial infarction); PNA (pneumonia); and Stroke (HCC).  PAST SURGICAL HISTORY: He  has a past surgical history that includes Below knee leg amputation (Left); Appendectomy; Percutaneous placement intravascular stent cervical carotid artery (Right); and Cardiac catheterization (N/A, 08/03/2015).  Allergies  Allergen Reactions  . Niaspan [Niacin Er] Hives and Other (See Comments)    Reaction:  Facial redness/hotness     No current facility-administered medications on file prior to encounter.    Current Outpatient Prescriptions on File Prior to Encounter  Medication Sig  . albuterol (PROVENTIL HFA;VENTOLIN HFA) 108 (90 BASE) MCG/ACT inhaler Inhale 1-2 puffs into the lungs every 6 (six) hours as needed for wheezing or shortness of breath.  Marland Kitchen atorvastatin (LIPITOR) 40 MG tablet Take 40 mg by mouth daily.  . cholecalciferol (VITAMIN D) 1000 UNITS tablet Take  1,000 Units by mouth daily.   . insulin aspart (NOVOLOG) 100 UNIT/ML injection Inject 0-15 Units into the skin 3 (three) times daily with meals as needed for high blood sugar. Pt uses as needed per sliding scale:   Less than 120:  0 units 120-180:  2 units 181-220:4 units 221-260:  6 units 261-300:  8 units 301-350:  10 units 351-400:  12 units Greater than 400:  Call MD  . insulin detemir (LEVEMIR) 100 UNIT/ML injection Inject 0.15 mLs (15 Units total) into the skin at bedtime.  Marland Kitchen apixaban (ELIQUIS) 5 MG TABS tablet Take 1 tablet (5 mg total) by mouth 2 (two) times daily. (Patient not taking: Reported on 04/28/2016)    FAMILY HISTORY:  His indicated that his mother is alive. He indicated that his father is deceased. He indicated that both of his sisters are alive.    SOCIAL HISTORY: He  reports that he has quit smoking. He has never used smokeless tobacco. He reports that he does not drink alcohol or use drugs.  REVIEW OF SYSTEMS:   Unable to obtain as patient is intubated.   SUBJECTIVE:  Potassium 3 this AM, repleted. Levophed weaned off at [facility] but restarted about 2 hours later.   VITAL SIGNS: BP 98/83   Pulse (!) 25   Temp 100.2 F (37.9 C)   Resp (!) 22   Ht 5\' 10"  (1.778 m)   Wt 102.3 kg (225 lb 8.5 oz)   SpO2 92%   BMI 32.36 kg/m   HEMODYNAMICS:    VENTILATOR SETTINGS: Vent Mode: PRVC FiO2 (%):  [30 %-40 %] 40 % Set Rate:  [  22 bmp] 22 bmp Vt Set:  [580 mL] 580 mL PEEP:  [5 cmH20] 5 cmH20 Plateau Pressure:  [23 cmH20-30 cmH20] 23 cmH20  INTAKE / OUTPUT: I/O last 3 completed shifts: In: 12585.1 [I.V.:2865.1; Other:950; NG/GT:960; IV Piggyback:7810] Out: 3482 [Urine:2780; Other:700; Stool:2]  PHYSICAL EXAMINATION: GEN: NAD, intubated, but awake and follows commands  CV: RRR, no murmurs, rubs, or gallops PULM: ETT, coarse breath sounds, normal effort  ABD: Soft, nontender, nondistended, PEG tube- mild surrounding erythema/minimal discharge SKIN: No rash  or cyanosis; warm and well-perfused EXTR: R BKAand L AKA  LABS:  BMET  Recent Labs Lab 04/22/16 0434 04/22/16 0919 04/23/16 0343  NA 147* 147* 145  K 3.0* 3.7 3.0*  CL 108 108 108  CO2 28 28 28   BUN 80* 72* 56*  CREATININE 2.24* 1.84* 1.08  GLUCOSE 188* 145* 174*    Electrolytes  Recent Labs Lab 04/29/2016 0937  04/22/16 0434 04/22/16 0441 04/22/16 0919 04/23/16 0343  CALCIUM 7.4*  < > 8.4*  --  8.3* 8.0*  MG 2.7*  --   --  2.5*  --  2.2  PHOS 5.8*  --   --  2.1*  --  1.9*  < > = values in this interval not displayed.  CBC  Recent Labs Lab 05/06/2016 0937 04/22/16 0441 04/23/16 0343  WBC 34.2* 19.4* 12.6*  HGB 11.3* 10.2* 8.8*  HCT 36.3* 31.8* 28.1*  PLT 422* 304 192    Coag's  Recent Labs Lab 04/15/2016 0937  APTT 34  INR 1.76    Sepsis Markers  Recent Labs Lab 04/22/2016 0937  04/22/16 0441  04/22/16 1314 04/22/16 1827 04/23/16 0000 04/23/16 0343  LATICACIDVEN 4.0*  < > 2.5*  < > 2.7* 1.9 1.9  --   PROCALCITON 1.97  --  24.88  --   --   --   --  14.94  < > = values in this interval not displayed.  ABG  Recent Labs Lab 04/30/2016 1017 04/22/16 0803  PHART 7.270* 7.424  PCO2ART 52.6* 40.0  PO2ART 251.0* 75.0*    Liver Enzymes  Recent Labs Lab 04/11/2016 0737 05/06/2016 0937 04/23/16 0343  AST 32 30  --   ALT 40 34  --   ALKPHOS 91 83  --   BILITOT 1.1 1.6*  --   ALBUMIN 3.5 2.9* 2.3*    Cardiac Enzymes  Recent Labs Lab 04/24/2016 0937 04/15/2016 1510 04/19/2016 2050  TROPONINI 0.06* 0.06* 0.07*    Glucose  Recent Labs Lab 04/23/16 0008 04/23/16 0110 04/23/16 0219 04/23/16 0341 04/23/16 0449 04/23/16 0556  GLUCAP 207* 203* 166* 168* 143* 115*    Imaging No results found.   STUDIES:  CXR 10/12: no edema or consolidation  Renal u/s 10/12>>> normal  Echo 10/12>>> EF 55-60%, mild LVH, possible hypokinesis of myocardium, mild mitral valve stenosis, L atrium mod-severely dilated.   CULTURES: BC 10/12>>> PCR with  staph and methicillin resistance but negative for staph aureus; gram positive cocci in clusters in one bottle  Urine 10/12>>> No Growth  Trecheal Aspirate cx 10/12>>>  ANTIBIOTICS: Vanc 10/12 >  Zosyn 10/12 > 10/12 Cefepime 10/12 >  SIGNIFICANT EVENTS: 10/12: admit  LINES/TUBES: ETT 10/12 > PEG tube Rectal Tube 10/12 > Urethral Catheter 10/2 > PICC RUE 10/11 > PIV x 1   ASSESSMENT / PLAN: 60yo male SNF resident with hx CHF, CAD s/p remote stent, DM, HTN, Aflutter s/p ablation on anticoagulation, severe PVD s/p R BKA, being treated for unknown infection at  SNF with rocephin presented with what is likely septic shock requiring pressor and acute respiratory failure possibly due to Bronchitis. RVP is negative.   PULMONARY Acute respiratory failure:  In the setting of what is likely septic shock. RVP negative  Bronchitis  P:   Continue vent support  Tracheal Aspirate Cx Daily SBT  PRN Albuterol  See ID   CARDIOVASCULAR Septic shock - Bronchitis vs UTI:  Hx CAD: troponin stable, likely demand in the setting of sepsis  Hx CHF: ECHO with EF 55-60%, mild LVH, possible hypokinesis of myocardium, mild mitral valve stenosis, L atrium mod-severely dilated.  A Flutter s/p ablation on coumadin  P:  Hold home lasix, metoprolol, lisinopril, coumadin Heparin gtt per pharmacy ASA  Levophed; MAP > 65  ECHO ordered   RENAL AKI, improving with good urine output: normal Renal US Hyperkalemia resolved now Hypokalemia Hypophosphatemia: 1.9 Hypernatremia, resolved AG metabolic acidosis: resolved  Lactic Acidosis: resolved   P:   Replace with KCl q4 hr x 2  K-Phos 2 packets TID x 3 doses  Replete electrolytes when indicated Continue free water flushes 250cc q 4 hrs   GASTROINTESTINAL No active issue  P:   NPO  PPI  Tube Feeds   HEMATOLOGIC Leukocytosis - Improving Anemia - chronic, decreasing to 8.8 from 10.2. No signs of acute bleeding  P:  F/u CBC  Heparin  gtt   INFECTIOUS Septic shock:  Pro-calcitonin improving  Decub - doubt source of sepsis  Acute Bronchitis  P:   Cefepime and Vanc (Day #3)  Follow culture  ICU procalcitonin algorithm   ENDOCRINE DM: A1c 10/6 (12/2015). CBGs improving  P:   Continue insulin drip   NEUROLOGIC AMS - r/t septic shock, respiratory failure  P:   RASS goal: -1 Fentanyl gtt  PRN versed and Fentanyl    FAMILY  - Updates: family updated 10/13 by nursing   - Inter-disciplinary family meet or Palliative Care meeting due by:  10/19 Palma Holter, MD PGY 2 Family Medicine   04/23/2016, 6:29 AM

## 2016-04-23 NOTE — Progress Notes (Signed)
PULMONARY / CRITICAL CARE MEDICINE   Name: Clayton [name] MRN: [id number] DOB: March 24, 1956    ADMISSION DATE:  04/11/2016  REFERRING MD:  Rubin PayorPickering (EDP)   CHIEF COMPLAINT:  Sepsis, respiratory failure   HISTORY OF PRESENT ILLNESS:   60yo male SNF resident with hx CHF, CAD s/p remote stent, DM, HTN, Aflutter s/p ablation on coumadin, severe PVD s/p R BKA, being treated for unknown infection at SNF with rocephin.  Presented 10/12 from SNF after being found unresponsive with agonal breaths.  Bagged by EMS en route and intubated on arrival to ER.  In ER was febrile to 104, hypotensive with SBP 70's.  Labs notable for AKI with K 7.1, glucose 500, WBC 36, lactate 3.6.  PCCM consulted for admit.   SUBJECTIVE: No acute events overnight. Patient remains on vasopressors and ventilator. Nurse reports patient sensitive to Versed bolus.   REVIEW OF SYSTEMS:  Unable to obtain given intubation & sedation.   VITAL SIGNS: BP 126/60   Pulse 92   Temp 99.5 F (37.5 C)   Resp (!) 22   Ht 5\' 10"  (1.778 m)   Wt 225 lb 8.5 oz (102.3 kg)   SpO2 100%   BMI 32.36 kg/m   HEMODYNAMICS:    VENTILATOR SETTINGS: Vent Mode: PRVC FiO2 (%):  [30 %-40 %] 40 % Set Rate:  [22 bmp] 22 bmp Vt Set:  [580 mL] 580 mL PEEP:  [5 cmH20] 5 cmH20 Plateau Pressure:  [23 cmH20-32 cmH20] 32 cmH20  INTAKE / OUTPUT: I/O last 3 completed shifts: In: 6672.7 [I.V.:3122.7; WUJWJ:1914Other:1280; NG/GT:2060; IV Piggyback:210] Out: 2897 [Urine:2895; Stool:2]  PHYSICAL EXAMINATION: General:  Obese male. No distress. No family at bedside.  Neuro:  Sedated. Not following commands today. Eyes barely open. HEENT:  ETT in place. No scleral icterus or injection. Cardiovascular:  Tachycardic with irregular rhythm. No appreciable JVD. Lungs:  Coarse breath sounds bilaterally. Symmetric chest rise on ventilator.  Abdomen:  Soft. Protuberant. Hypoactive bowel sounds. Flexi-Seal rectal tube in place for stool. Musculoskeletal:  Warm and dry.  Amputations to bilateral lower extremities.  LABS:  BMET  Recent Labs Lab 04/22/16 0434 04/22/16 0919 04/23/16 0343  NA 147* 147* 145  K 3.0* 3.7 3.0*  CL 108 108 108  CO2 28 28 28   BUN 80* 72* 56*  CREATININE 2.24* 1.84* 1.08  GLUCOSE 188* 145* 174*    Electrolytes  Recent Labs Lab 05/07/2016 0937  04/22/16 0434 04/22/16 0441 04/22/16 0919 04/23/16 0343  CALCIUM 7.4*  < > 8.4*  --  8.3* 8.0*  MG 2.7*  --   --  2.5*  --  2.2  PHOS 5.8*  --   --  2.1*  --  1.9*  < > = values in this interval not displayed.  CBC  Recent Labs Lab 04/18/2016 0937 04/22/16 0441 04/23/16 0343  WBC 34.2* 19.4* 12.6*  HGB 11.3* 10.2* 8.8*  HCT 36.3* 31.8* 28.1*  PLT 422* 304 192    Coag's  Recent Labs Lab 04/26/2016 0937  APTT 34  INR 1.76    Sepsis Markers  Recent Labs Lab 04/17/2016 0937  04/22/16 0441  04/22/16 1314 04/22/16 1827 04/23/16 0000 04/23/16 0343  LATICACIDVEN 4.0*  < > 2.5*  < > 2.7* 1.9 1.9  --   PROCALCITON 1.97  --  24.88  --   --   --   --  14.94  < > = values in this interval not displayed.  ABG  Recent Labs Lab 05/06/2016 1017  04/22/16 0803  PHART 7.270* 7.424  PCO2ART 52.6* 40.0  PO2ART 251.0* 75.0*    Liver Enzymes  Recent Labs Lab 05/01/16 0737 May 01, 2016 0937 04/23/16 0343  AST 32 30  --   ALT 40 34  --   ALKPHOS 91 83  --   BILITOT 1.1 1.6*  --   ALBUMIN 3.5 2.9* 2.3*    Cardiac Enzymes  Recent Labs Lab May 01, 2016 0937 2016-05-01 1510 05-01-2016 2050  TROPONINI 0.06* 0.06* 0.07*    Glucose  Recent Labs Lab 04/23/16 0449 04/23/16 0556 04/23/16 0650 04/23/16 0742 04/23/16 0905 04/23/16 1010  GLUCAP 143* 115* 130* 160* 159* 147*    Imaging No results found.   STUDIES:  Renal U/S 10/12:  No abnormality or hydronephrosis.  TTE 10/13:  Difficult study. Mild LVH w/ EF 55%. Possible akinesis apical myocardium without obvious thrombus.Moderate aortic stenosis w/ mild mitral stenosis. RV poorly  visualized.  MICROBIOLOGY: MRSA PCR 10/12:  Negative Blood Ctx x2 10/12 >> Urine Ctx 10/12 >> Tracheal Asp Ctx 10/13 >> Respiratory Viral Panel PCR 10/13:  Negative   ANTIBIOTICS: Zosyn 10/12 x1 dose Vancomycin 10/12 >> Cefepime 10/12 >>  SIGNIFICANT EVENTS: 10/12 - Admit & intubation in ED  LINES/TUBES: OETT 7.5 10/12 >>  RUE PICC (SNF) 10/11 >>   Foley 10/12 >> PEG >> PIV x1  ASSESSMENT / PLAN:  INFECTIOUS A: Severe Sepsis Possible UTI Probable Acute Bronchitis  P:   Empiric Vancomycin & Cefepime Day #3 Awaiting cultures Trending Procalcitonin per algorithm  PULMONARY A: Acute Hypoxic Respiratory Failure - Questionable etiology. No opacity on CXR. Improving.  Possible Acute Bronchitis  P:   Full Vent Support  Holding on SBT until patient stabilizes Intermittent CXR & ABG Albuterol nebs prn  CARDIOVASCULAR A: Shock - Likely septic. Improving. Elevated Troponin I - Likely demand ischemia. Echo was difficult to interpret. Questionable apical akinesis. Aortic Stenosis - Seen on poor quality TTE. SVT - Questionable Atrial flutter. H/O CAD w/ Stent H/O CHF - Dilated LV w/ decreased function November 2016. H/O A Flutter - S/P ablation.  P:  Continuous telemetry monitoring Vitals per unit protocol Heparin drip per pharmacy protocol once INR <2.0 Holding home lasix, metoprolol, lisinopril, & eliquis  Weaning Levophed to Neo-Synephrine to maintain MAP >65 & SBP >90 ASA 81mg  VT daily  RENAL A: Acute Renal Failure - Resolving. Likely due to hypovolemia/shock. Hypernatremia - Resolved. Hypophosphatemia - Replacing. Hypokalemia - Replacing. Lactic Acidosis - Resolved. AGMA - Resolved.   P:   Monitoring UOP with Foley Trending Electrolytes and Renal function Free Water 200cc VT q4hr Replacing electrolytes as indicated KPhos packet  KCl VT x2  GASTROINTESTINAL A: No active issue.  P:   NPO  Continuing tube feedings Protonix VT  daily  HEMATOLOGIC A: Leukocytosis - Improving. Due to sepsis. Anemia - Chronic. Hgb with significant drop today. No signs of active bleeding. Coagulopathy - Possibly secondary to Eliquis vs Coumadin.  P:  Trending cell counts daily w/ CBC Heparin drip per pharmacy protocol Repeat Hgb/Hct @ 1700 today  ENDOCRINE A: H/O DM - A1c 10.6 in June 2017. Glucose controlled now.  P:   Starting NPH 15u [place] q12hr Starting SSI per Moderate Agorithm Starting Accu-Checks q4hr D/C Insulin gtt 1 hour after NPH is given  NEUROLOGIC A: Acute Encephalopathy - Multifactorial from hypoxia and probable toxic metabolic. Sedation on Ventilator  P:   RASS goal: 0 to -1 Fentanyl gtt & IV prn pain & discomfort Versed IV prn sedation  FAMILY  - Updates: No family at bedside 10/14. Son in [place].  - Inter-disciplinary family meet or Palliative Care meeting due by:  10/19  TODAY'S SUMMARY:  60 y.o. male with multiple medical problems. Presenting with septic shock. Source likely UTI. Switching from Levophed to Neo-Synephrine given intermittent tachycardia. Continuing ventilator support. Awaiting culture results. Transitioning off insulin drip over to subcutaneous regimen. Continuing empiric antibiotics.  I have spent a total of 31 minutes of critical care time today caring for the patient and reviewing the patient's electronic medical record.   Donna Christen Jamison Neighbor, M.D. [facility] Pulmonary & Critical Care Pager:  [phone] After 3pm or if no response, call [phone] 04/23/2016  10:27 AM

## 2016-04-24 ENCOUNTER — Inpatient Hospital Stay (HOSPITAL_COMMUNITY): Payer: Medicare HMO

## 2016-04-24 DIAGNOSIS — D649 Anemia, unspecified: Secondary | ICD-10-CM

## 2016-04-24 LAB — CBC WITH DIFFERENTIAL/PLATELET
BASOS ABS: 0 10*3/uL (ref 0.0–0.1)
BASOS ABS: 0 10*3/uL (ref 0.0–0.1)
BASOS PCT: 0 %
Basophils Relative: 0 %
EOS ABS: 0.1 10*3/uL (ref 0.0–0.7)
EOS PCT: 1 %
EOS PCT: 0 %
Eosinophils Absolute: 0 10*3/uL (ref 0.0–0.7)
HCT: 27.5 % — ABNORMAL LOW (ref 39.0–52.0)
HCT: 23.6 % — ABNORMAL LOW (ref 39.0–52.0)
HEMOGLOBIN: 8.3 g/dL — AB (ref 13.0–17.0)
Hemoglobin: 7.1 g/dL — ABNORMAL LOW (ref 13.0–17.0)
LYMPHS ABS: 1.2 10*3/uL (ref 0.7–4.0)
LYMPHS PCT: 8 %
Lymphocytes Relative: 12 %
Lymphs Abs: 0.6 10*3/uL — ABNORMAL LOW (ref 0.7–4.0)
MCH: 27.2 pg (ref 26.0–34.0)
MCH: 27 pg (ref 26.0–34.0)
MCHC: 30.2 g/dL (ref 30.0–36.0)
MCHC: 30.1 g/dL (ref 30.0–36.0)
MCV: 90.2 fL (ref 78.0–100.0)
MCV: 89.7 fL (ref 78.0–100.0)
MONO ABS: 0.5 10*3/uL (ref 0.1–1.0)
Monocytes Absolute: 0.5 10*3/uL (ref 0.1–1.0)
Monocytes Relative: 5 %
Monocytes Relative: 7 %
NEUTROS PCT: 82 %
Neutro Abs: 8.4 10*3/uL — ABNORMAL HIGH (ref 1.7–7.7)
Neutro Abs: 6.3 10*3/uL (ref 1.7–7.7)
Neutrophils Relative %: 85 %
PLATELETS: 178 10*3/uL (ref 150–400)
PLATELETS: 160 10*3/uL (ref 150–400)
RBC: 3.05 MIL/uL — AB (ref 4.22–5.81)
RBC: 2.63 MIL/uL — ABNORMAL LOW (ref 4.22–5.81)
RDW: 19.9 % — ABNORMAL HIGH (ref 11.5–15.5)
RDW: 19.6 % — AB (ref 11.5–15.5)
WBC: 10.3 10*3/uL (ref 4.0–10.5)
WBC: 7.4 10*3/uL (ref 4.0–10.5)

## 2016-04-24 LAB — CBC
HCT: 25.6 % — ABNORMAL LOW (ref 39.0–52.0)
HCT: 26.4 % — ABNORMAL LOW (ref 39.0–52.0)
HEMOGLOBIN: 7.6 g/dL — AB (ref 13.0–17.0)
Hemoglobin: 7.9 g/dL — ABNORMAL LOW (ref 13.0–17.0)
MCH: 26.7 pg (ref 26.0–34.0)
MCH: 27.3 pg (ref 26.0–34.0)
MCHC: 29.7 g/dL — ABNORMAL LOW (ref 30.0–36.0)
MCHC: 30.3 g/dL (ref 30.0–36.0)
MCV: 90.1 fL (ref 78.0–100.0)
MCV: 89.8 fL (ref 78.0–100.0)
PLATELETS: 165 10*3/uL (ref 150–400)
Platelets: 170 10*3/uL (ref 150–400)
RBC: 2.93 MIL/uL — AB (ref 4.22–5.81)
RBC: 2.85 MIL/uL — AB (ref 4.22–5.81)
RDW: 19.8 % — AB (ref 11.5–15.5)
RDW: 19.4 % — ABNORMAL HIGH (ref 11.5–15.5)
WBC: 9.2 10*3/uL (ref 4.0–10.5)
WBC: 10.9 10*3/uL — AB (ref 4.0–10.5)

## 2016-04-24 LAB — RENAL FUNCTION PANEL
ALBUMIN: 2.3 g/dL — AB (ref 3.5–5.0)
ANION GAP: 8 (ref 5–15)
BUN: 58 mg/dL — ABNORMAL HIGH (ref 6–20)
CALCIUM: 8 mg/dL — AB (ref 8.9–10.3)
CO2: 28 mmol/L (ref 22–32)
CREATININE: 0.87 mg/dL (ref 0.61–1.24)
Chloride: 107 mmol/L (ref 101–111)
Glucose, Bld: 171 mg/dL — ABNORMAL HIGH (ref 65–99)
PHOSPHORUS: 3.8 mg/dL (ref 2.5–4.6)
Potassium: 4.1 mmol/L (ref 3.5–5.1)
SODIUM: 143 mmol/L (ref 135–145)

## 2016-04-24 LAB — GLUCOSE, CAPILLARY
GLUCOSE-CAPILLARY: 179 mg/dL — AB (ref 65–99)
GLUCOSE-CAPILLARY: 201 mg/dL — AB (ref 65–99)
GLUCOSE-CAPILLARY: 248 mg/dL — AB (ref 65–99)
Glucose-Capillary: 177 mg/dL — ABNORMAL HIGH (ref 65–99)
Glucose-Capillary: 180 mg/dL — ABNORMAL HIGH (ref 65–99)
Glucose-Capillary: 221 mg/dL — ABNORMAL HIGH (ref 65–99)
Glucose-Capillary: 222 mg/dL — ABNORMAL HIGH (ref 65–99)

## 2016-04-24 LAB — MAGNESIUM: MAGNESIUM: 2.2 mg/dL (ref 1.7–2.4)

## 2016-04-24 LAB — CULTURE, BLOOD (ROUTINE X 2)

## 2016-04-24 MED ORDER — INSULIN NPH (HUMAN) (ISOPHANE) 100 UNIT/ML ~~LOC~~ SUSP
20.0000 [IU] | Freq: Two times a day (BID) | SUBCUTANEOUS | Status: DC
  Administered 2016-04-24 (×2): 20 [IU] via SUBCUTANEOUS
  Filled 2016-04-24: qty 10

## 2016-04-24 NOTE — Progress Notes (Signed)
PULMONARY / CRITICAL CARE MEDICINE   Name: Clayton [name] MRN: [id number] DOB: Dec 20, 1955    ADMISSION DATE:  2016/04/24  REFERRING MD:  Rubin Payor (EDP)   CHIEF COMPLAINT:  Sepsis, respiratory failure   HISTORY OF PRESENT ILLNESS:   60yo male SNF resident with hx CHF, CAD s/p remote stent, DM, HTN, Aflutter s/p ablation on coumadin, severe PVD s/p R BKA, being treated for unknown infection at SNF with rocephin.  Presented 10/12 from SNF after being found unresponsive with agonal breaths.  Bagged by EMS en route and intubated on arrival to ER.  In ER was febrile to 104, hypotensive with SBP 70's.  Labs notable for AKI with K 7.1, glucose 500, WBC 36, lactate 3.6.  PCCM consulted for admit.   SUBJECTIVE: Patient's hemoglobin continued to trend downward overnight and heparin drip was held.  REVIEW OF SYSTEMS:  Unable to obtain given intubation & sedation.   VITAL SIGNS: BP (!) 120/96   Pulse 75   Temp 99.1 F (37.3 C)   Resp (!) 22   Ht 5\' 10"  (1.778 m)   Wt 232 lb 9.4 oz (105.5 kg)   SpO2 100%   BMI 33.37 kg/m   HEMODYNAMICS:    VENTILATOR SETTINGS: Vent Mode: PRVC FiO2 (%):  [40 %] 40 % Set Rate:  [22 bmp] 22 bmp Vt Set:  [580 mL] 580 mL PEEP:  [5 cmH20] 5 cmH20 Plateau Pressure:  [21 cmH20-26 cmH20] 21 cmH20  INTAKE / OUTPUT: I/O last 3 completed shifts: In: 8822.2 [P.O.:2300; I.V.:2417.2; Other:505; NG/GT:3150; IV Piggyback:450] Out: 2065 [Urine:2065]  PHYSICAL EXAMINATION: General:  Obese male. No family at bedside. TV on. Neuro:  Sedated. Eyes open spontaneously but not following commands. HEENT:  ETT in place. No scleral icterus or injection. Cardiovascular:  Irregular rhythm with tachycardia. No appreciable JVD. Lungs:  Clear on auscultation bilaterally. Symmetric chest rise on ventilator.  Abdomen:  Soft. Protuberant. Hypoactive bowel sounds. Flexi-Seal rectal tube in place for stool. Musculoskeletal:  Warm and dry. Partial amputations to bilateral lower  extremities.  LABS:  BMET  Recent Labs Lab 04/23/16 0343 04/23/16 1017 04/24/16 0330  NA 145 144 143  K 3.0* 3.1* 4.1  CL 108 107 107  CO2 28 28 28   BUN 56* 56* 58*  CREATININE 1.08 1.07 0.87  GLUCOSE 174* 161* 171*    Electrolytes  Recent Labs Lab 04/22/16 0441  04/23/16 0343 04/23/16 1017 04/24/16 0330 04/24/16 0350  CALCIUM  --   < > 8.0* 8.0* 8.0*  --   MG 2.5*  --  2.2  --   --  2.2  PHOS 2.1*  --  1.9*  --  3.8  --   < > = values in this interval not displayed.  CBC  Recent Labs Lab 04/23/16 0028 04/23/16 0343 04/23/16 1626 04/24/16 0350  WBC 9.2 12.6*  --  10.3  HGB 7.9* 8.8* 8.0* 8.3*  HCT 26.4* 28.1* 26.3* 27.5*  PLT 165 192  --  178    Coag's  Recent Labs Lab 04-24-2016 0937  APTT 34  INR 1.76    Sepsis Markers  Recent Labs Lab 04-24-16 0937  04/22/16 0441  04/22/16 1314 04/22/16 1827 04/23/16 0000 04/23/16 0343  LATICACIDVEN 4.0*  < > 2.5*  < > 2.7* 1.9 1.9  --   PROCALCITON 1.97  --  24.88  --   --   --   --  14.94  < > = values in this interval not displayed.  ABG  Recent Labs Lab 04/27/2016 1017 04/22/16 0803  PHART 7.270* 7.424  PCO2ART 52.6* 40.0  PO2ART 251.0* 75.0*    Liver Enzymes  Recent Labs Lab 04/11/2016 0737 04/22/2016 0937 04/23/16 0343 04/24/16 0330  AST 32 30  --   --   ALT 40 34  --   --   ALKPHOS 91 83  --   --   BILITOT 1.1 1.6*  --   --   ALBUMIN 3.5 2.9* 2.3* 2.3*    Cardiac Enzymes  Recent Labs Lab 05/07/2016 0937 04/11/2016 1510 05/07/2016 2050  TROPONINI 0.06* 0.06* 0.07*    Glucose  Recent Labs Lab 04/23/16 1343 04/23/16 1602 04/23/16 1950 04/24/16 0027 04/24/16 0334 04/24/16 0749  GLUCAP 221* 224* 214* 222* 179* 177*    Imaging No results found.   STUDIES:  Renal U/S 10/12:  No abnormality or hydronephrosis.  TTE 10/13:  Difficult study. Mild LVH w/ EF 55%. Possible akinesis apical myocardium without obvious thrombus.Moderate aortic stenosis w/ mild mitral stenosis. RV  poorly visualized.  MICROBIOLOGY: MRSA PCR 10/12:  Negative Blood Ctx x2 10/12 >> 1/2 Coag Neg Staph Urine Ctx 10/12:  Negative  Tracheal Asp Ctx 10/13 >> Respiratory Viral Panel PCR 10/13:  Negative   ANTIBIOTICS: Zosyn 10/12 x1 dose Vancomycin 10/12 >> Cefepime 10/12 >>  SIGNIFICANT EVENTS: 10/12 - Admit & intubation in ED  LINES/TUBES: OETT 7.5 10/12 >>  RUE PICC (SNF) 10/11 >>   Foley 10/12 >> PEG >> PIV x1  ASSESSMENT / PLAN:  INFECTIOUS A: Severe Sepsis Possible UTI Probable Acute Bronchitis  P:   Empiric Vancomycin & Cefepime Day #4 Awaiting cultures Trending Procalcitonin per algorithm Consider PICC removal once alternative IV access established then repeat blood cultures  PULMONARY A: Acute Hypoxic Respiratory Failure - Questionable etiology. No opacity on CXR. Improving.  Possible Acute Bronchitis  P:   Full Vent Support  Daily PS Wean & SBT Intermittent CXR & ABG Albuterol nebs prn  CARDIOVASCULAR A: Shock - Likely septic. Resolved. Elevated Troponin I - Likely demand ischemia. Echo was difficult to interpret. Questionable apical akinesis. Aortic Stenosis - Seen on poor quality TTE. SVT - Questionable Atrial flutter. H/O CAD w/ Stent H/O CHF - Dilated LV w/ decreased function November 2016. H/O A Flutter - S/P ablation.  P:  Continuous telemetry monitoring Vitals per unit protocol Holding home lasix, metoprolol, lisinopril, & eliquis  Goal MAP >65 & SBP >90 ASA 81mg  VT daily  RENAL A: Acute Renal Failure - Resolving. Likely due to hypovolemia/shock. Hypernatremia - Resolved. Hypophosphatemia - Resolved. Hypokalemia - Resolved. Lactic Acidosis - Resolved. AGMA - Resolved.   P:   Monitoring UOP with Foley Trending Electrolytes and Renal function Free Water 200cc VT q4hr Replacing electrolytes as indicated  GASTROINTESTINAL A: No active issue.  P:   NPO  Continuing tube feedings Protonix VT daily Checking CT  Abdomen/Pelvis w/o contrast for possible bleeding  HEMATOLOGIC A: Leukocytosis -  Resolved. Due to sepsis. Anemia - Chronic but now worsened on anticoagulation. No signs of active bleeding. Coagulopathy - Possibly secondary to Eliquis vs Coumadin.  P:  Trending cell counts daily w/ CBC Trending Hgb/Hct q12hr Transfuse for Hgb <7.0 Holding Heparin gtt given bleeding concern Ordering SCDs - Can place on right leg Checking CT Abdomen/Pelvis w/o contrast for possible bleeding  ENDOCRINE A: H/O DM - A1c 10.6 in June 2017. Glucose controlled now.  P:   Increasing NPH to 20u [place] q12hr SSI per Moderate Agorithm Accu-Checks q4hr  NEUROLOGIC A:  Acute Encephalopathy - Multifactorial from hypoxia and probable toxic metabolic. Sedation on Ventilator  P:   RASS goal: 0 to -1 Fentanyl gtt & IV prn pain & discomfort Versed IV prn sedation   FAMILY  - Updates: No family at bedside 10/15. [place] in [place].  - Inter-disciplinary family meet or Palliative Care meeting due by:  10/19  TODAY'S SUMMARY:  60 y.o. male with multiple medical problems. Presenting with septic shock. Source likely UTI. Switching from Levophed to Neo-Synephrine given intermittent tachycardia. Continuing ventilator support while weaning. Checking CT abdomen/pelvis to assess for possible intra-abdominal blood loss. Continuing to hold heparin drip. Trending cell counts every 12 hours. Plan to establish alternative IV access to remove right upper extremity PICC line.  I have spent a total of 34 minutes of critical care time today caring for the patient and reviewing the patient's electronic medical record.   Donna ChristenJennings E. Jamison NeighborNestor, M.D. [facility] Pager:  [phone] After 3pm or if no response, call [phone] 04/24/2016  8:12 AM

## 2016-04-24 NOTE — Progress Notes (Signed)
RT note- Patient taken to CT of Abdomen, returned from transport and remains on current settings.

## 2016-04-25 DIAGNOSIS — J9601 Acute respiratory failure with hypoxia: Secondary | ICD-10-CM

## 2016-04-25 LAB — GLUCOSE, CAPILLARY
GLUCOSE-CAPILLARY: 250 mg/dL — AB (ref 65–99)
Glucose-Capillary: 217 mg/dL — ABNORMAL HIGH (ref 65–99)
Glucose-Capillary: 232 mg/dL — ABNORMAL HIGH (ref 65–99)
Glucose-Capillary: 276 mg/dL — ABNORMAL HIGH (ref 65–99)
Glucose-Capillary: 243 mg/dL — ABNORMAL HIGH (ref 65–99)
Glucose-Capillary: 209 mg/dL — ABNORMAL HIGH (ref 65–99)

## 2016-04-25 LAB — CBC WITH DIFFERENTIAL/PLATELET
BASOS ABS: 0 10*3/uL (ref 0.0–0.1)
BASOS ABS: 0 10*3/uL (ref 0.0–0.1)
BASOS PCT: 0 %
BASOS PCT: 0 %
EOS ABS: 0.1 10*3/uL (ref 0.0–0.7)
EOS ABS: 0 10*3/uL (ref 0.0–0.7)
EOS PCT: 0 %
Eosinophils Relative: 0 %
HCT: 25.9 % — ABNORMAL LOW (ref 39.0–52.0)
HCT: 22.1 % — ABNORMAL LOW (ref 39.0–52.0)
HEMOGLOBIN: 6.8 g/dL — AB (ref 13.0–17.0)
Hemoglobin: 7.8 g/dL — ABNORMAL LOW (ref 13.0–17.0)
Lymphocytes Relative: 6 %
Lymphocytes Relative: 7 %
Lymphs Abs: 0.9 10*3/uL (ref 0.7–4.0)
Lymphs Abs: 0.8 10*3/uL (ref 0.7–4.0)
MCH: 27.6 pg (ref 26.0–34.0)
MCH: 27.5 pg (ref 26.0–34.0)
MCHC: 30.1 g/dL (ref 30.0–36.0)
MCHC: 30.8 g/dL (ref 30.0–36.0)
MCV: 91.5 fL (ref 78.0–100.0)
MCV: 89.5 fL (ref 78.0–100.0)
MONO ABS: 0.9 10*3/uL (ref 0.1–1.0)
MONOS PCT: 7 %
MONOS PCT: 6 %
Monocytes Absolute: 0.6 10*3/uL (ref 0.1–1.0)
NEUTROS PCT: 87 %
Neutro Abs: 11.9 10*3/uL — ABNORMAL HIGH (ref 1.7–7.7)
Neutro Abs: 9 10*3/uL — ABNORMAL HIGH (ref 1.7–7.7)
Neutrophils Relative %: 87 %
PLATELETS: 199 10*3/uL (ref 150–400)
Platelets: 181 10*3/uL (ref 150–400)
RBC: 2.83 MIL/uL — ABNORMAL LOW (ref 4.22–5.81)
RBC: 2.47 MIL/uL — AB (ref 4.22–5.81)
RDW: 19.7 % — AB (ref 11.5–15.5)
RDW: 19.3 % — ABNORMAL HIGH (ref 11.5–15.5)
WBC: 13.8 10*3/uL — ABNORMAL HIGH (ref 4.0–10.5)
WBC: 10.4 10*3/uL (ref 4.0–10.5)

## 2016-04-25 LAB — PREPARE RBC (CROSSMATCH)

## 2016-04-25 LAB — MAGNESIUM: MAGNESIUM: 2.1 mg/dL (ref 1.7–2.4)

## 2016-04-25 LAB — BASIC METABOLIC PANEL
Anion gap: 8 (ref 5–15)
BUN: 57 mg/dL — ABNORMAL HIGH (ref 6–20)
CALCIUM: 7.9 mg/dL — AB (ref 8.9–10.3)
CO2: 25 mmol/L (ref 22–32)
CREATININE: 1.06 mg/dL (ref 0.61–1.24)
Chloride: 106 mmol/L (ref 101–111)
Glucose, Bld: 234 mg/dL — ABNORMAL HIGH (ref 65–99)
Potassium: 4.4 mmol/L (ref 3.5–5.1)
Sodium: 139 mmol/L (ref 135–145)

## 2016-04-25 LAB — RENAL FUNCTION PANEL
ALBUMIN: 2 g/dL — AB (ref 3.5–5.0)
ANION GAP: 9 (ref 5–15)
BUN: 58 mg/dL — ABNORMAL HIGH (ref 6–20)
CHLORIDE: 107 mmol/L (ref 101–111)
CO2: 24 mmol/L (ref 22–32)
Calcium: 7.9 mg/dL — ABNORMAL LOW (ref 8.9–10.3)
Creatinine, Ser: 1.02 mg/dL (ref 0.61–1.24)
GFR calc Af Amer: >60 mL/min (ref >60)
GLUCOSE: 230 mg/dL — AB (ref 65–99)
PHOSPHORUS: 3.3 mg/dL (ref 2.5–4.6)
POTASSIUM: 4.5 mmol/L (ref 3.5–5.1)
Sodium: 140 mmol/L (ref 135–145)

## 2016-04-25 LAB — CULTURE, RESPIRATORY

## 2016-04-25 LAB — CULTURE, RESPIRATORY W GRAM STAIN: Special Requests: NORMAL

## 2016-04-25 LAB — HEPARIN LEVEL (UNFRACTIONATED): Heparin Unfractionated: 0.1 IU/mL — ABNORMAL LOW (ref 0.30–0.70)

## 2016-04-25 LAB — HEMOGLOBIN AND HEMATOCRIT, BLOOD
HEMATOCRIT: 26 % — AB (ref 39.0–52.0)
Hemoglobin: 8 g/dL — ABNORMAL LOW (ref 13.0–17.0)

## 2016-04-25 LAB — VANCOMYCIN, TROUGH: Vancomycin Tr: 23 ug/mL (ref 15–20)

## 2016-04-25 LAB — LACTATE DEHYDROGENASE: LDH: 160 U/L (ref 98–192)

## 2016-04-25 MED ORDER — DILTIAZEM HCL 30 MG PO TABS
30.0000 mg | ORAL_TABLET | Freq: Four times a day (QID) | ORAL | Status: DC
  Filled 2016-04-25: qty 1

## 2016-04-25 MED ORDER — SODIUM CHLORIDE 0.9 % IV SOLN
500.0000 mg | Freq: Two times a day (BID) | INTRAVENOUS | Status: DC
  Administered 2016-04-25 – 2016-04-26 (×2): 500 mg via INTRAVENOUS
  Filled 2016-04-25 (×4): qty 500

## 2016-04-25 MED ORDER — COLLAGENASE 250 UNIT/GM EX OINT
TOPICAL_OINTMENT | Freq: Every day | CUTANEOUS | Status: DC
  Administered 2016-04-25 – 2016-04-28 (×4): via TOPICAL
  Administered 2016-04-29: 1 via TOPICAL
  Administered 2016-04-30 – 2016-05-03 (×4): via TOPICAL
  Administered 2016-05-03 – 2016-05-05 (×3): 1 via TOPICAL
  Administered 2016-05-06 – 2016-05-07 (×2): via TOPICAL
  Filled 2016-04-25 (×3): qty 30

## 2016-04-25 MED ORDER — METOPROLOL TARTRATE 5 MG/5ML IV SOLN
5.0000 mg | INTRAVENOUS | Status: DC | PRN
  Administered 2016-04-25 – 2016-05-04 (×4): 5 mg via INTRAVENOUS
  Filled 2016-04-25 (×5): qty 5

## 2016-04-25 MED ORDER — INSULIN ASPART 100 UNIT/ML ~~LOC~~ SOLN
3.0000 [IU] | SUBCUTANEOUS | Status: DC
  Administered 2016-04-25 – 2016-04-26 (×6): 3 [IU] via SUBCUTANEOUS

## 2016-04-25 MED ORDER — INSULIN NPH (HUMAN) (ISOPHANE) 100 UNIT/ML ~~LOC~~ SUSP
23.0000 [IU] | Freq: Two times a day (BID) | SUBCUTANEOUS | Status: DC
  Administered 2016-04-25: 23 [IU] via SUBCUTANEOUS
  Filled 2016-04-25: qty 10

## 2016-04-25 MED ORDER — INSULIN DETEMIR 100 UNIT/ML ~~LOC~~ SOLN
25.0000 [IU] | Freq: Two times a day (BID) | SUBCUTANEOUS | Status: DC
  Administered 2016-04-25: 25 [IU] via SUBCUTANEOUS
  Filled 2016-04-25 (×2): qty 0.25

## 2016-04-25 MED ORDER — POTASSIUM CHLORIDE 10 MEQ/50ML IV SOLN
10.0000 meq | INTRAVENOUS | Status: AC
  Administered 2016-04-25 (×3): 10 meq via INTRAVENOUS
  Filled 2016-04-25 (×3): qty 50

## 2016-04-25 MED ORDER — SODIUM CHLORIDE 0.9 % IV SOLN
Freq: Once | INTRAVENOUS | Status: AC
  Administered 2016-04-25: 20:00:00 via INTRAVENOUS

## 2016-04-25 MED ORDER — POTASSIUM CHLORIDE 20 MEQ/15ML (10%) PO SOLN
40.0000 meq | Freq: Once | ORAL | Status: AC
  Administered 2016-04-25: 40 meq
  Filled 2016-04-25: qty 30

## 2016-04-25 MED ORDER — FREE WATER
100.0000 mL | Status: DC
  Administered 2016-04-25 – 2016-04-27 (×13): 100 mL

## 2016-04-25 MED ORDER — PIPERACILLIN-TAZOBACTAM 3.375 G IVPB
3.3750 g | Freq: Three times a day (TID) | INTRAVENOUS | Status: DC
  Administered 2016-04-25 – 2016-04-26 (×3): 3.375 g via INTRAVENOUS
  Filled 2016-04-25 (×5): qty 50

## 2016-04-25 MED ORDER — HEPARIN (PORCINE) IN NACL 100-0.45 UNIT/ML-% IJ SOLN
1750.0000 [IU]/h | INTRAMUSCULAR | Status: DC
  Administered 2016-04-25: 1450 [IU]/h via INTRAVENOUS
  Administered 2016-04-26: 1750 [IU]/h via INTRAVENOUS
  Filled 2016-04-25 (×4): qty 250

## 2016-04-25 MED ORDER — DILTIAZEM 12 MG/ML ORAL SUSPENSION
30.0000 mg | Freq: Four times a day (QID) | ORAL | Status: DC
  Administered 2016-04-25 – 2016-04-26 (×3): 30 mg
  Filled 2016-04-25 (×5): qty 3

## 2016-04-25 NOTE — Care Management Note (Signed)
Case Management Note  Patient Details  Name: Clayton [name] MRN: [id number] Date of Birth: 1956-06-15  Subjective/Objective:    Pt admitted with sepsis                Action/Plan:  Per H&P pt is from SNF or Assisted Living.  Pt is intubated without family at bedside.  CM unable to reach next of kin via phone.  CSW consulted 04/23/2016 by attending.  CM will verbally communicate with CSW.  CM will continue to follow for discharge needs   Expected Discharge Date:                  Expected Discharge Plan:  Skilled Nursing Facility (From facility)  In-House Referral:  Clinical Social Work  Discharge planning Services  CM Consult  Post Acute Care Choice:    Choice offered to:     DME Arranged:    DME Agency:     HH Arranged:    HH Agency:     Status of Service:  In process, will continue to follow  If discussed at Long Length of Stay Meetings, dates discussed:    Additional Comments:  Clayton [name], Clayton Conwell S, RN 04/25/2016, 10:41 AM

## 2016-04-25 NOTE — Progress Notes (Signed)
ANTICOAGULATION CONSULT NOTE - Follow Up Consult  Pharmacy Consult for Heparin Indication: atrial fibrillation  Allergies  Allergen Reactions  . Niaspan [Niacin Er] Hives and Other (See Comments)    Reaction:  Facial redness/hotness     Patient Measurements: Height: _0  (177.8 cm) Weight: 234 lb 2.1 oz (106.2 kg) IBW/kg (Calculated) : 73  Vital Signs: Temp: 102.2 F (39 C) (10/16 1900) BP: 88/60 (10/16 1900) Pulse Rate: 117 (10/16 1900)  Labs:  Recent Labs  04/23/16 0343 04/23/16 1017 04/23/16 1230  04/24/16 0330  04/24/16 2315 04/25/16 0546 04/25/16 0605 04/25/16 1452 04/25/16 1842  HGB 8.8*  --   --   < >  --   < > 7.6* 7.8*  --  6.8*  --   HCT 28.1*  --   --   < >  --   < > 25.6* 25.9*  --  22.1*  --   PLT 192  --   --   --   --   < > 170 199  --  181  --   HEPARINUNFRC 0.21*  --  0.12*  --   --   --   --   --   --   --  <0.10*  CREATININE 1.08 1.07  --   --  0.87  --   --   --  1.02  1.06  --   --   < > = values in this interval not displayed.  Estimated Creatinine Clearance: 88.2 mL/min (by C-G formula based on SCr of 1.06 mg/dL).   Medications:  Scheduled:  . sodium chloride   Intravenous Once  . aspirin  81 mg Oral Daily  . chlorhexidine gluconate (MEDLINE KIT)  15 mL Mouth Rinse BID  . collagenase   Topical Daily  . diltiazem  30 mg Per Tube Q6H  . feeding supplement (PRO-STAT SUGAR FREE 64)  30 mL Per Tube TID  . feeding supplement (VITAL HIGH PROTEIN)  1,000 mL Per Tube Q24H  . free water  100 mL Per Tube Q4H  . insulin aspart  0-15 Units Subcutaneous Q4H  . insulin aspart  3 Units Subcutaneous Q4H  . insulin detemir  25 Units Subcutaneous BID  . mouth rinse  15 mL Mouth Rinse QID  . pantoprazole sodium  40 mg Per Tube Q1200  . piperacillin-tazobactam (ZOSYN)  IV  3.375 g Intravenous Q8H  . vancomycin  500 mg Intravenous Q12H    Assessment: 60yo male with AFib.  Heparin resumed today, but not hung until ~3PM.  Heparin level is < 0.1, but  this is only a 4hr level & no bolus was given which makes this difficult to assess.  Per d/w RN, there have been no issues with the IV or pump.  Will repeat level in 4hr and adjust dosing as needed.     Gracy Bruins, PharmD Clinical Pharmacist [facility]

## 2016-04-25 NOTE — Consult Note (Signed)
WOC Nurse wound consult note Reason for Consult: Worsening sacral pressure injury Wound type: Pressure Injury, evolving DTI Pressure Ulcer POA: Yes Measurement: 7 cm x 8 cm Wound bed: 50% pink, clean tissue; remainder darkened purple tissue.  Top layer of epidermis has been lost.  Flexiseal fecal containment system in use for diarrhea. Drainage:  None Periwound: Fragile, intact skin Dressing procedure/placement/frequency: Santyl with saline moistened gauze daily, covered with dry gauze.   Discussed POC with patient and bedside nurse.  Re consult if needed, will not follow at this time. Thanks Helmut MusterSherry Chandlor Noecker MSN, RN, CNS-BC, [employer]

## 2016-04-25 NOTE — Progress Notes (Signed)
Inpatient Diabetes Program Recommendations  AACE/ADA: New Consensus Statement on Inpatient Glycemic Control (2015)  Target Ranges:  Prepandial:   less than 140 mg/dL      Peak postprandial:   less than 180 mg/dL (1-2 hours)      Critically ill patients:  140 - 180 mg/dL   Lab Results  Component Value Date   GLUCAP 232 (H) 04/25/2016   HGBA1C 10.6 (H) 01/08/2016    Review of Glycemic ControlResults for Clayton [name], Clayton [name] (MRN [id number]) as of 04/25/2016 11:27  Ref. Range 04/24/2016 03:34 04/24/2016 07:49 04/24/2016 12:07 04/24/2016 15:53 04/24/2016 20:05 04/24/2016 23:39 04/25/2016 03:51 04/25/2016 07:26  Glucose-Capillary Latest Ref Range: 65 - 99 mg/dL 347179 (H) 425177 (H) 956180 (H) 221 (H) 201 (H) 248 (H) 217 (H) 232 (H)    Inpatient Diabetes Program Recommendations:    Please consider d/c of NPH and switch to Levemir 25 units bid.  Consider also adding Novolog tube feed coverage 4 units q 4 hours.   Thanks, Clayton MeagerJenny Sandip Power, RN, BC-ADM Inpatient Diabetes Coordinator Pager [phone] (8a-5p)

## 2016-04-25 NOTE — Care Management Important Message (Signed)
Important Message  Patient Details  Name: Clayton [name] MRN: [id number] Date of Birth: 1956/07/09   Medicare Important Message Given:  Yes    Kyla BalzarineShealy, Jerryl Holzhauer Abena 04/25/2016, 1:26 PM

## 2016-04-25 NOTE — Progress Notes (Signed)
eLink Physician-Brief Progress Note Patient Name: Clayton [name] DOB: [birth year] MRN: [id number]   Date of Service  04/25/2016  HPI/Events of Note  k low  eICU Interventions  supp oral and IV as had associated arythmia     Intervention Category Major Interventions: Electrolyte abnormality - evaluation and management  Nelda BucksFEINSTEIN,Gay Rape J. 04/25/2016, 12:47 AM

## 2016-04-25 NOTE — Progress Notes (Signed)
PULMONARY / CRITICAL CARE MEDICINE   Name: ALYSSA MANCERA MRN: [id number] DOB: Jan 18, 1956    ADMISSION DATE:  05-15-16  REFERRING MD:  Rubin Payor (EDP)   CHIEF COMPLAINT:  Sepsis, respiratory failure   HISTORY OF PRESENT ILLNESS:   60yo male SNF resident with hx CHF, CAD s/p remote stent, DM, HTN, Aflutter s/p ablation on coumadin, severe PVD s/p R BKA, being treated for unknown infection at SNF with rocephin.  Presented 10/12 from SNF after being found unresponsive with agonal breaths.  Bagged by EMS en route and intubated on arrival to ER.  In ER was febrile to 104, hypotensive with SBP 70's.  Labs notable for AKI with K 7.1, glucose 500, WBC 36, lactate 3.6.  PCCM consulted for admit.   SUBJECTIVE: Overnight, K noted to be low s/p repletion (oral and IV). Eyes open this am but does not follow commands.   REVIEW OF SYSTEMS:  Unable to obtain given intubation & sedation.   VITAL SIGNS: BP (!) 96/59 (BP Location: Left Arm)   Pulse 88   Temp 99.9 F (37.7 C)   Resp (!) 22   Ht 5\' 10"  (1.778 m)   Wt 106.2 kg (234 lb 2.1 oz)   SpO2 100%   BMI 33.59 kg/m   HEMODYNAMICS:    VENTILATOR SETTINGS: Vent Mode: PRVC FiO2 (%):  [40 %] 40 % Set Rate:  [22 bmp] 22 bmp Vt Set:  [580 mL] 580 mL PEEP:  [5 cmH20] 5 cmH20 Plateau Pressure:  [22 cmH20-26 cmH20] 26 cmH20  INTAKE / OUTPUT: I/O last 3 completed shifts: In: 5443.3 [I.V.:2068.3; Other:175; NG/GT:2600; IV Piggyback:600] Out: 2060 [Urine:2060]  PHYSICAL EXAMINATION:  General:  NAD Neuro:  Sedated. Eyes open spontaneously but not following commands. HEENT:  ETT in place. No scleral icterus or injection. Cardiovascular:  Irregular rhythm with tachycardia. No appreciable JVD. Lungs:  Clear on auscultation bilaterally. Symmetric chest rise on ventilator.  Abdomen:  Seems tight. Protuberant. Hypoactive bowel sounds. Flexi-Seal rectal tube in place for stool. Musculoskeletal:  Warm and dry. Partial amputations to bilateral lower  extremities.  LABS:  BMET  Recent Labs Lab 04/23/16 1017 04/24/16 0330 04/25/16 2359  NA 144 143 141  K 3.1* 4.1 3.4*  CL 107 107 106  CO2 28 28 26   BUN 56* 58* 57*  CREATININE 1.07 0.87 0.99  GLUCOSE 161* 171* 281*    Electrolytes  Recent Labs Lab 04/23/16 0343 04/23/16 1017 04/24/16 0330 04/24/16 0350 04/25/16 2359  CALCIUM 8.0* 8.0* 8.0*  --  8.0*  MG 2.2  --   --  2.2 2.0  PHOS 1.9*  --  3.8  --  3.3    CBC  Recent Labs Lab 04/24/16 0350 04/24/16 1703 04/24/16 2315  WBC 10.3 7.4 10.9*  HGB 8.3* 7.1* 7.6*  HCT 27.5* 23.6* 25.6*  PLT 178 160 170    Coag's  Recent Labs Lab 05/15/16 0937  APTT 34  INR 1.76    Sepsis Markers  Recent Labs Lab 05/15/16 0937  04/22/16 0441  04/22/16 1314 04/22/16 1827 04/23/16 0000 04/23/16 0343  LATICACIDVEN 4.0*  < > 2.5*  < > 2.7* 1.9 1.9  --   PROCALCITON 1.97  --  24.88  --   --   --   --  14.94  < > = values in this interval not displayed.  ABG  Recent Labs Lab 05-15-2016 1017 04/22/16 0803  PHART 7.270* 7.424  PCO2ART 52.6* 40.0  PO2ART 251.0* 75.0*  Liver Enzymes  Recent Labs Lab 04/25/2016 0737 05/01/2016 0937 04/23/16 0343 04/24/16 0330 04/25/16 2359  AST 32 30  --   --   --   ALT 40 34  --   --   --   ALKPHOS 91 83  --   --   --   BILITOT 1.1 1.6*  --   --   --   ALBUMIN 3.5 2.9* 2.3* 2.3* 2.0*    Cardiac Enzymes  Recent Labs Lab 05/01/2016 0937 05/07/2016 1510 05/07/2016 2050  TROPONINI 0.06* 0.06* 0.07*    Glucose  Recent Labs Lab 04/24/16 0749 04/24/16 1207 04/24/16 1553 04/24/16 2005 04/24/16 2339 04/25/16 0351  GLUCAP 177* 180* 221* 201* 248* 217*    Imaging Ct Abdomen Pelvis Wo Contrast  Result Date: 04/24/2016 CLINICAL DATA:  Evaluate for intra-abdominal bleeding. Abdominal distention. EXAM: CT ABDOMEN AND PELVIS WITHOUT CONTRAST TECHNIQUE: Multidetector CT imaging of the abdomen and pelvis was performed following the standard protocol without IV contrast.  COMPARISON:  No comparison studies available. FINDINGS: Lower chest: Patchy airspace disease right lower lobe, consistent with aspiration and/or pneumonia. Small bilateral pleural effusions noted. Hepatobiliary: No focal abnormality in the liver on this study without intravenous contrast. Gallbladder is distended with numerous tiny layering calcified gallstones. No intrahepatic or extrahepatic biliary dilation. Pancreas: No focal mass lesion. No dilatation of the main duct. No intraparenchymal cyst. No peripancreatic edema. Spleen: No splenomegaly. No focal mass lesion. Adrenals/Urinary Tract: No adrenal nodule or mass. Kidneys are unremarkable. No evidence for hydroureter. Bladder decompressed by Foley catheter. Gas bubble in the bladder lumen likely secondary to the instrumentation. Stomach/Bowel: Stomach is decompressed. Gastrostomy tube is evident. Duodenum is normally positioned as is the ligament of Treitz. No small bowel wall thickening. No small bowel dilatation. The terminal ileum is normal. The appendix is not visualized, but there is no edema or inflammation in the region of the cecum. No gross colonic mass. No colonic wall thickening. No substantial diverticular change. Rectal tube visualized in situ. Vascular/Lymphatic: There is abdominal aortic atherosclerosis without aneurysm. There is no gastrohepatic or hepatoduodenal ligament lymphadenopathy. No intraperitoneal or retroperitoneal lymphadenopathy. No pelvic sidewall lymphadenopathy. Reproductive: The prostate gland and seminal vesicles have normal imaging features. Other: No intraperitoneal free fluid. Musculoskeletal: Bone windows reveal no worrisome lytic or sclerotic osseous lesions. IMPRESSION: 1. No evidence for retroperitoneal hemorrhage. No intraperitoneal free fluid to suggest pneumoperitoneum. 2. Right lower lobe central airspace disease compatible with aspiration and/or pneumonia. 3. Cholelithiasis. 4. Abdominal aortic atherosclerosis.  Electronically Signed   By: Kennith Center M.D.   On: 04/24/2016 11:22     STUDIES:  Renal U/S 10/12:  No abnormality or hydronephrosis.  TTE 10/13:  Difficult study. Mild LVH w/ EF 55%. Possible akinesis apical myocardium without obvious thrombus.Moderate aortic stenosis w/ mild mitral stenosis. RV poorly visualized. CT abdomen 10/15: no evidence for retroperitoneal hemorrhage. RLL central airspace disease compatible with aspiration and/or PNA  MICROBIOLOGY: MRSA PCR 10/12:  Negative Blood Ctx x2 10/12 >> 1/2 Coag Neg Staph Urine Ctx 10/12:  Negative  Tracheal Asp Ctx 10/13 >> MODERATE STAPHYLOCOCCUS AUREUS Respiratory Viral Panel PCR 10/13:  Negative   ANTIBIOTICS: Zosyn 10/12 x1 dose Vancomycin 10/12 >> Cefepime 10/12 >>  SIGNIFICANT EVENTS: 10/12 - Admit & intubation in ED 10/14 - switched from Levo to Neo due to intermittent tachycardia  10/15 - off pressor  LINES/TUBES: OETT 7.5 10/12 >>  RUE PICC (SNF) 10/11 >>   Foley 10/12 >> PEG >> PIV x1  ASSESSMENT / PLAN:  Mr. Yetta BarreJones is a  60 y.o. male with PMH CHF, CAD s/p remote stent, DM, HTN, Aflutter s/p ablation on anticoagulation, severe PVD s/p R BKA. Presenting with septic shock thought to be possibly from PNA/aspiration vs acute bronchitis.  Continuing to hold heparin drip for now. Trending cell counts every 12 hours. Plan to establish alternative IV access to remove right upper extremity PICC line.  INFECTIOUS A: Severe Sepsis Possible UTI Probable Acute Bronchitis RLL central airspace disease compatible with aspiration/PNA on Imaging   P:   Empiric Vancomycin & Cefepime Day #5 Awaiting cultures Consider PICC removal once alternative IV access established then repeat blood cultures  PULMONARY A: Acute Hypoxic Respiratory Failure - Questionable etiology. RLL central airspace disease compatible with aspiration/PNA on Imaging (CT). Improving.  Possible Acute Bronchitis  P:   Full Vent Support  Daily PS Wean &  SBT Intermittent CXR & ABG Albuterol nebs prn  CARDIOVASCULAR A: Shock - Likely septic. Resolved and off presors Atrial Fibrillation with RVR Elevated Troponin I - Likely demand ischemia. Echo was difficult to interpret. Questionable apical akinesis. Aortic Stenosis - Seen on poor quality TTE. SVT - Questionable Atrial flutter. H/O CAD w/ Stent H/O CHF - Dilated LV w/ decreased function November 2016. H/O A Flutter - S/P ablation.  P:  Consider Diltiazem for rate control Heparin drip held on 10/14 for drop in hgb Continuous telemetry monitoring Vitals per unit protocol Holding home lasix, metoprolol, lisinopril, & eliquis  Goal MAP >65 & SBP >90 ASA 81mg  VT daily  RENAL A: Acute Renal Failure - Resolving. Likely due to hypovolemia/shock. Hypernatremia - Resolved. Hypophosphatemia - Resolved. Hypokalemia - resolved  Lactic Acidosis - Resolved. AGMA - Resolved.   P:   Monitoring UOP with Foley Trending Electrolytes and Renal function Free Water 200cc VT q4hr Replacing electrolytes as indicated  GASTROINTESTINAL A: No active issue.  P:   NPO  Continuing tube feedings Protonix VT daily  HEMATOLOGIC A: Leukocytosis -  Mildly elevated again. AM CBC pending Anemia - Chronic but now worsened on anticoagulation. No signs of active bleeding. Coagulopathy - Possibly secondary to Eliquis vs Coumadin.  P:  Trending cell counts daily w/ CBC Trending Hgb/Hct q12hr Transfuse for Hgb <7.0 Continue holding Heparin gtt given bleeding concern SCDs - Can place on right leg  ENDOCRINE A: H/O DM - A1c 10.6 in June 2017. Glucose controlled now.  P:   Increasing NPH from 20u Wittmann q12hr to 23 Units q 12hr SSI per Moderate Agorithm Accu-Checks q4hr  NEUROLOGIC A: Acute Encephalopathy - Multifactorial from hypoxia and probable toxic metabolic. Sedation on Ventilator  P:   RASS goal: 0 to -1 Fentanyl gtt & IV prn pain & discomfort Versed IV prn sedation   FAMILY  -  Inter-disciplinary family meet or Palliative Care meeting due by:  10/19  Palma HolterKanishka G Gunadasa, MD PGY 2 Family Medicine  04/25/2016  6:05 AM

## 2016-04-25 NOTE — Progress Notes (Signed)
eLink Physician-Brief Progress Note Patient Name: Clayton [name] DOB: 12-07-1955 MRN: [id number]   Date of Service  04/25/2016  HPI/Events of Note  Patient with anemia and hemoglobin 6.8. Hemoglobin has progressively been trending downward. Restarted on systemic anticoagulation today. Anemia thought to be secondary to chronic illness with no evidence of bleeding on CT abdomen/pelvis. Last type and screen 10/12.   eICU Interventions  1. Checking LDH & haptoglobin stat 2. Stat type and screen 3. Transfuse 1 unit packed red blood cells over 2 hours 4. Hemoglobin/hematocrit posttransfusion      Intervention Category Intermediate Interventions: Other:  Lawanda CousinsJennings Veena Sturgess 04/25/2016, 4:09 PM

## 2016-04-25 NOTE — Progress Notes (Signed)
ANTICOAGULATION & ANTIBIOTICS CONSULT NOTE - Initial Consult  Pharmacy Consult for anticoagulation with heparin and antibiotics vancomycin and Zosyn. Indication: atrial fibrillation  Allergies  Allergen Reactions  . Niaspan [Niacin Er] Hives and Other (See Comments)    Reaction:  Facial redness/hotness     Patient Measurements: Height: 5\' 10"  (177.8 cm) Weight: 234 lb 2.1 oz (106.2 kg) IBW/kg (Calculated) : 73 Heparin Dosing Weight: 96 kg  Vital Signs: Temp: 99.9 F (37.7 C) (10/16 1120) BP: 117/92 (10/16 1120) Pulse Rate: 100 (10/16 1120)  Labs:  Recent Labs  04/23/16 0343 04/23/16 1017 04/23/16 1230  04/24/16 0330  04/24/16 1703 04/24/16 2315 04/25/16 0546 04/25/16 0605  HGB 8.8*  --   --   < >  --   < > 7.1* 7.6* 7.8*  --   HCT 28.1*  --   --   < >  --   < > 23.6* 25.6* 25.9*  --   PLT 192  --   --   --   --   < > 160 170 199  --   HEPARINUNFRC 0.21*  --  0.12*  --   --   --   --   --   --   --   CREATININE 1.08 1.07  --   --  0.87  --   --   --   --  1.02  1.06  < > = values in this interval not displayed.  Estimated Creatinine Clearance: 88.2 mL/min (by C-G formula based on SCr of 1.06 mg/dL).   Medical History: Past Medical History:  Diagnosis Date  . CHF (congestive heart failure) (HCC)   . Coronary artery disease   . CVA (cerebral infarction) 2007  . Diabetes (HCC)   . Diabetes mellitus without complication (HCC)   . Diabetic neuropathy (HCC)   . Diabetic retinopathy (HCC)   . GERD (gastroesophageal reflux disease)   . Hyperlipidemia   . Hypertension   . MI (myocardial infarction)    x2  . PNA (pneumonia)   . Stroke [facility])     Medications:  Infusions:  . sodium chloride 10 mL/hr at 04/24/16 2054  . fentaNYL 400 mcg/hr (04/25/16 0800)    Assessment: A 60 year old male with atrial flutter on warfarin PTA now on heparin per pharmacy dosing, who was also admitted for sepsis and on vancomycin and Zosyn per pharmacy dosing.   Anticoagulation:  Started on heparin drip 1400 units/hr on 10/12 and was subtherapeutic (heparin level 0.21, 0.12). Heparin put on hold 10/14 due to decrease Hgb (13 >> 7.6) and PLT (425 >> 160). Hgb remains 7.8 and PLT increased 199. Patient currently in atrial fibrillation and Heparin drip to be restarted. No evidence of active bleed.   Infectious Disease: He received a loading dose vancomycin 2 gm and one dose Zosyn 3.375 gm in ED. Zosyn was stopped and cefepime 1 gm q12h initiated. Patient with MRSA pneumonia and large sacral decubitus ulcer concerning for possible source. Cefepime changed to Zosyn today for additional anaerobe coverage and continuing vancomycin. Vancomycin trough above goal at 23 (goal 15-20). Low grade fever of 100.6, WBC increasing 7.4 >> 13.8, PCT trending down lactic acid wnl 1.9, good UOP above 0.76mL/kg/min confirmed with nurse.   Goal of Therapy:  Heparin level 0.3-0.7 units/ml Monitor platelets by anticoagulation protocol: Yes  Vancomycin trough of 15-20 mcg/mL   Plan:  No heparin bolus Start heparin infusion at 1450 units/hr  Check heparin level at 6 hours Monitor  daily heparin level and CBC while on therapy Monitor signs/symptoms of bleeding  Start Zosyn 3.375 gm q8h - 4 hour infusion Decrease vancomycin to 500 mg q12h Monitor renal function, clinical status, and cultures   Microbiology results: 10/12 BCx: 1/2 positive Staph species, methicillin resistant  10/12 UCx: No growth 10/12 MRSA PCR: negative 10/13 Respiratory PCR: not detected  10/13 Respiratory Cx: positive methicillin resistant staph aureas  Clayton [name], PharmD Candidate 04/25/2016,12:22 PM

## 2016-04-26 ENCOUNTER — Inpatient Hospital Stay (HOSPITAL_COMMUNITY): Payer: Medicare HMO

## 2016-04-26 DIAGNOSIS — Z978 Presence of other specified devices: Secondary | ICD-10-CM

## 2016-04-26 LAB — RENAL FUNCTION PANEL
Albumin: 1.8 g/dL — ABNORMAL LOW (ref 3.5–5.0)
Anion gap: 9 (ref 5–15)
BUN: 73 mg/dL — AB (ref 6–20)
CALCIUM: 7.9 mg/dL — AB (ref 8.9–10.3)
CO2: 25 mmol/L (ref 22–32)
CREATININE: 1.32 mg/dL — AB (ref 0.61–1.24)
Chloride: 107 mmol/L (ref 101–111)
GFR, EST NON AFRICAN AMERICAN: 56 mL/min — AB (ref >60)
Glucose, Bld: 218 mg/dL — ABNORMAL HIGH (ref 65–99)
Phosphorus: 3 mg/dL (ref 2.5–4.6)
Potassium: 3.5 mmol/L (ref 3.5–5.1)
SODIUM: 141 mmol/L (ref 135–145)

## 2016-04-26 LAB — GLUCOSE, CAPILLARY
GLUCOSE-CAPILLARY: 175 mg/dL — AB (ref 65–99)
GLUCOSE-CAPILLARY: 189 mg/dL — AB (ref 65–99)
GLUCOSE-CAPILLARY: 98 mg/dL (ref 65–99)
Glucose-Capillary: 229 mg/dL — ABNORMAL HIGH (ref 65–99)
Glucose-Capillary: 148 mg/dL — ABNORMAL HIGH (ref 65–99)
Glucose-Capillary: 129 mg/dL — ABNORMAL HIGH (ref 65–99)
Glucose-Capillary: 74 mg/dL (ref 65–99)

## 2016-04-26 LAB — CBC WITH DIFFERENTIAL/PLATELET
BASOS ABS: 0 10*3/uL (ref 0.0–0.1)
BASOS PCT: 0 %
EOS ABS: 0.2 10*3/uL (ref 0.0–0.7)
EOS PCT: 1 %
HCT: 25.6 % — ABNORMAL LOW (ref 39.0–52.0)
Hemoglobin: 7.9 g/dL — ABNORMAL LOW (ref 13.0–17.0)
Lymphocytes Relative: 13 %
Lymphs Abs: 1.5 10*3/uL (ref 0.7–4.0)
MCH: 27.2 pg (ref 26.0–34.0)
MCHC: 30.9 g/dL (ref 30.0–36.0)
MCV: 88.3 fL (ref 78.0–100.0)
Monocytes Absolute: 0.8 10*3/uL (ref 0.1–1.0)
Monocytes Relative: 6 %
Neutro Abs: 9.3 10*3/uL — ABNORMAL HIGH (ref 1.7–7.7)
Neutrophils Relative %: 80 %
PLATELETS: 203 10*3/uL (ref 150–400)
RBC: 2.9 MIL/uL — AB (ref 4.22–5.81)
RDW: 19.3 % — AB (ref 11.5–15.5)
WBC: 11.7 10*3/uL — AB (ref 4.0–10.5)

## 2016-04-26 LAB — CBC
HCT: 24.4 % — ABNORMAL LOW (ref 39.0–52.0)
HEMOGLOBIN: 7.6 g/dL — AB (ref 13.0–17.0)
MCH: 27.5 pg (ref 26.0–34.0)
MCHC: 31.1 g/dL (ref 30.0–36.0)
MCV: 88.4 fL (ref 78.0–100.0)
Platelets: 227 10*3/uL (ref 150–400)
RBC: 2.76 MIL/uL — AB (ref 4.22–5.81)
RDW: 19.3 % — ABNORMAL HIGH (ref 11.5–15.5)
WBC: 10.9 10*3/uL — ABNORMAL HIGH (ref 4.0–10.5)

## 2016-04-26 LAB — TYPE AND SCREEN
ABO/RH(D): O POS
Antibody Screen: NEGATIVE
UNIT DIVISION: 0

## 2016-04-26 LAB — C DIFFICILE QUICK SCREEN W PCR REFLEX
C DIFFICILE (CDIFF) TOXIN: NEGATIVE
C DIFFICLE (CDIFF) ANTIGEN: NEGATIVE
C Diff interpretation: NOT DETECTED

## 2016-04-26 LAB — CULTURE, BLOOD (ROUTINE X 2): Culture: NO GROWTH

## 2016-04-26 LAB — BASIC METABOLIC PANEL
Anion gap: 11 (ref 5–15)
BUN: 73 mg/dL — AB (ref 6–20)
CO2: 24 mmol/L (ref 22–32)
CREATININE: 1.25 mg/dL — AB (ref 0.61–1.24)
Calcium: 7.9 mg/dL — ABNORMAL LOW (ref 8.9–10.3)
Chloride: 108 mmol/L (ref 101–111)
GFR calc Af Amer: >60 mL/min (ref >60)
Glucose, Bld: 124 mg/dL — ABNORMAL HIGH (ref 65–99)
Potassium: 3.7 mmol/L (ref 3.5–5.1)
SODIUM: 143 mmol/L (ref 135–145)

## 2016-04-26 LAB — MAGNESIUM: MAGNESIUM: 2 mg/dL (ref 1.7–2.4)

## 2016-04-26 LAB — HEPARIN LEVEL (UNFRACTIONATED): Heparin Unfractionated: 0.12 IU/mL — ABNORMAL LOW (ref 0.30–0.70)

## 2016-04-26 LAB — HAPTOGLOBIN: HAPTOGLOBIN: 245 mg/dL — AB (ref 34–200)

## 2016-04-26 MED ORDER — INSULIN DETEMIR 100 UNIT/ML ~~LOC~~ SOLN
28.0000 [IU] | Freq: Two times a day (BID) | SUBCUTANEOUS | Status: DC
  Administered 2016-04-26: 28 [IU] via SUBCUTANEOUS
  Filled 2016-04-26 (×3): qty 0.28

## 2016-04-26 MED ORDER — DOXYCYCLINE HYCLATE 100 MG IV SOLR: 100.0000 mg | Freq: Two times a day (BID) | INTRAVENOUS | Status: DC

## 2016-04-26 MED ORDER — FENTANYL 2500MCG IN NS 250ML (10MCG/ML) PREMIX INFUSION
25.0000 ug/h | INTRAVENOUS | Status: DC
  Administered 2016-04-26: 400 ug/h via INTRAVENOUS
  Administered 2016-04-26: 350 ug/h via INTRAVENOUS
  Administered 2016-04-27: 400 ug/h via INTRAVENOUS
  Filled 2016-04-26 (×4): qty 250

## 2016-04-26 MED ORDER — DILTIAZEM 12 MG/ML ORAL SUSPENSION
15.0000 mg | Freq: Four times a day (QID) | ORAL | Status: DC
  Administered 2016-04-26 – 2016-05-02 (×18): 15 mg
  Filled 2016-04-26 (×29): qty 3

## 2016-04-26 MED ORDER — INSULIN ASPART 100 UNIT/ML ~~LOC~~ SOLN
6.0000 [IU] | SUBCUTANEOUS | Status: DC
  Administered 2016-04-26: 6 [IU] via SUBCUTANEOUS

## 2016-04-26 MED ORDER — POTASSIUM CHLORIDE 20 MEQ/15ML (10%) PO SOLN
30.0000 meq | Freq: Once | ORAL | Status: AC
  Administered 2016-04-26: 30 meq
  Filled 2016-04-26: qty 30

## 2016-04-26 MED ORDER — VANCOMYCIN HCL 500 MG IV SOLR
500.0000 mg | Freq: Two times a day (BID) | INTRAVENOUS | Status: AC
  Administered 2016-04-26 – 2016-04-27 (×3): 500 mg via INTRAVENOUS
  Filled 2016-04-26 (×3): qty 500

## 2016-04-26 MED ORDER — INSULIN ASPART 100 UNIT/ML ~~LOC~~ SOLN
5.0000 [IU] | SUBCUTANEOUS | Status: DC
  Administered 2016-04-26: 5 [IU] via SUBCUTANEOUS

## 2016-04-26 MED ORDER — FUROSEMIDE 10 MG/ML IJ SOLN
20.0000 mg | Freq: Once | INTRAMUSCULAR | Status: AC
  Administered 2016-04-26: 20 mg via INTRAVENOUS
  Filled 2016-04-26: qty 2

## 2016-04-26 NOTE — Progress Notes (Signed)
PULMONARY / CRITICAL CARE MEDICINE   Name: ALPER GUILMETTE MRN: [id number] DOB: Sep 12, 1955    ADMISSION DATE:  04-28-16  REFERRING MD:  Rubin Payor (EDP)   CHIEF COMPLAINT:  Sepsis, respiratory failure   HISTORY OF PRESENT ILLNESS:   60yo male SNF resident with hx CHF, CAD s/p remote stent, DM, HTN, Aflutter s/p ablation on coumadin, severe PVD s/p R BKA, being treated for unknown infection at SNF with rocephin.  Presented 10/12 from SNF after being found unresponsive with agonal breaths.  Bagged by EMS en route and intubated on arrival to ER.  In ER was febrile to 104, hypotensive with SBP 70's.  Labs notable for AKI with K 7.1, glucose 500, WBC 36, lactate 3.6.  PCCM consulted for admit.   SUBJECTIVE: Patient with downtrending hemoglobin to 6.8 after starting IV heparin. Checked LDH and Haptoglobin; LDH was normal, haptoglobin mildly elevated at 245. Transfused 1 unit pRBC. Hemoglobin post transfusion 8. Heart rate improved yesterday after restarting home cardizem. MAP 62-80, SBP seems slightly lower than the day prior. Did not tolerate wean yesterday due to tachycardia.   REVIEW OF SYSTEMS:  Unable to obtain given intubation & sedation.   VITAL SIGNS: BP (!) 83/53   Pulse 86   Temp 99.3 F (37.4 C)   Resp (!) 22   Ht 5\' 10"  (1.778 m)   Wt 227 lb 1.2 oz (103 kg)   SpO2 100%   BMI 32.58 kg/m   HEMODYNAMICS:    VENTILATOR SETTINGS: Vent Mode: PRVC FiO2 (%):  [40 %] 40 % Set Rate:  [22 bmp] 22 bmp Vt Set:  [580 mL] 580 mL PEEP:  [5 cmH20] 5 cmH20 Plateau Pressure:  [17 cmH20-27 cmH20] 27 cmH20  INTAKE / OUTPUT: I/O last 3 completed shifts: In: 4126.2 [I.V.:1776.2; NG/GT:1700; IV Piggyback:650] Out: 2240 [Urine:2240]  PHYSICAL EXAMINATION:  General:  NAD Neuro:  Sedated. Eyes open spontaneously and follows some commands . HEENT:  ETT in place. No scleral icterus or injection. Cardiovascular:  Irregular rhythm with tachycardia. No appreciable JVD. Lungs:  Clear on  auscultation bilaterally. Symmetric chest rise on ventilator.  Abdomen:  Seems tight. Protuberant. Hypoactive bowel sounds. Flexi-Seal rectal tube in place for stool. Musculoskeletal:  Warm and dry. 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  LABS:  BMET  Recent Labs Lab 04/24/16 0330 04/25/16 0605 04/26/16 0500  NA 143 140  139 141  K 4.1 4.5  4.4 3.5  CL 107 107  106 107  CO2 28 24  25 25   BUN 58* 58*  57* 73*  CREATININE 0.87 1.02  1.06 1.32*  GLUCOSE 171* 230*  234* 218*    Electrolytes  Recent Labs Lab 04/24/16 0330 04/24/16 0350 04/25/16 0605 04/26/16 0500  CALCIUM 8.0*  --  7.9*  7.9* 7.9*  MG  --  2.2 2.1 2.0  PHOS 3.8  --  3.3 3.0    CBC  Recent Labs Lab 04/25/16 0546 04/25/16 1452 04/25/16 2300 04/26/16 0500  WBC 13.8* 10.4  --  11.7*  HGB 7.8* 6.8* 8.0* 7.9*  HCT 25.9* 22.1* 26.0* 25.6*  PLT 199 181  --  203    Coag's  Recent Labs Lab 2016/04/28 0937  APTT 34  INR 1.76    Sepsis Markers  Recent Labs Lab 04/28/2016 0937  04/22/16 0441  04/22/16 1314 04/22/16 1827 04/23/16 0000 04/23/16 0343  LATICACIDVEN 4.0*  < > 2.5*  < > 2.7* 1.9 1.9  --   PROCALCITON 1.97  --  24.88  --   --   --   --  14.94  < > = values in this interval not displayed.  ABG  Recent Labs Lab 04/20/2016 1017 04/22/16 0803  PHART 7.270* 7.424  PCO2ART 52.6* 40.0  PO2ART 251.0* 75.0*    Liver Enzymes  Recent Labs Lab 04/27/2016 0737 04/19/2016 0937  04/24/16 0330 04/25/16 0605 04/26/16 0500  AST 32 30  --   --   --   --   ALT 40 34  --   --   --   --   ALKPHOS 91 83  --   --   --   --   BILITOT 1.1 1.6*  --   --   --   --   ALBUMIN 3.5 2.9*  < > 2.3* 2.0* 1.8*  < > = values in this interval not displayed.  Cardiac Enzymes  Recent Labs Lab 04/11/2016 0937 04/16/2016 1510 05/07/2016 2050  TROPONINI 0.06* 0.06* 0.07*    Glucose  Recent Labs Lab 04/25/16 0726 04/25/16 1158 04/25/16 1547 04/25/16 1941 04/25/16 2356  04/26/16 0349  GLUCAP 232* 276* 243* 209* 250* 229*    Imaging No results found.   STUDIES:  Renal U/S 10/12:  No abnormality or hydronephrosis.  TTE 10/13:  Difficult study. Mild LVH w/ EF 55%. Possible akinesis apical myocardium without obvious thrombus.Moderate aortic stenosis w/ mild mitral stenosis. RV poorly visualized. CT abdomen 10/15: no evidence for retroperitoneal hemorrhage. RLL central airspace disease compatible with aspiration and/or PNA  MICROBIOLOGY: MRSA PCR 10/12:  Negative Blood Ctx x2 10/12 >> 1/2 Coag Neg Staph; 1/2 NG x 4 days  Urine Ctx 10/12:  Negative  Tracheal Asp Ctx 10/13 >> MRSA Respiratory Viral Panel PCR 10/13:  Negative   ANTIBIOTICS: Zosyn 10/12 x1 dose; 10/16 >> Vancomycin 10/12 >> Cefepime 10/12 >> 10/16  SIGNIFICANT EVENTS: 10/12 - Admit & intubation in ED 10/14 - switched from Levo to Neo due to intermittent tachycardia  10/15 - off pressor  LINES/TUBES: OETT 7.5 10/12 >>  RUE PICC (SNF) 10/11 >>   Foley 10/12 >> PEG >> PIV x1  ASSESSMENT / PLAN:  Mr. Sallade is a  60 y.o. male with PMH CHF, CAD s/p remote stent, DM, HTN, Aflutter s/p ablation on anticoagulation, severe PVD s/p R BKA. Presenting with septic shock thought to be possibly from PNA/aspiration . Restarted Heparin 10/16. Trending cell counts every 12 hours. Tachycardia improved with restart of Cardizem but blood pressure is borderline.   INFECTIOUS A: Severe Sepsis, improving  RLL central airspace disease compatible with aspiration/PNA on Imaging: tracheal aspirate culture with MRSA   P:   Vancomycin (Day #6) S/p Cefepime (5 days) > broadened to Zosyn 10/16 (day 2 )  PULMONARY A: Acute Hypoxic Respiratory Failure - RLL central airspace disease compatible with aspiration/PNA on Imaging (CT). Improving.  Mild Vascular congestion in CXR   P:   Full Vent Support  Daily PS Wean & SBT Intermittent CXR & ABG Albuterol nebs prn  CARDIOVASCULAR A: Shock - Likely  septic. Resolved and off presors. However borderline MAPs since restarting Cardizem Elevated Troponin I - Likely demand ischemia. Echo was difficult to interpret. Questionable apical akinesis. Aortic Stenosis - Seen on poor quality TTE. SVT - Questionable Atrial flutter. H/O CAD w/ Stent H/O CHF - Dilated LV w/ decreased function November 2016. H/O A Flutter - S/P ablation   P:  Cardizem 30mg  q 6 hours (home dose): consider decreasing dose due to borderline MAPs Heparin  drip  Continuous telemetry monitoring Vitals per unit protocol Holding home lasix, metoprolol, lisinopril, & eliquis  Goal MAP >65 & SBP >90 ASA 81mg  VT daily  RENAL A: Acute Renal Failure - acutely worsened overnight. BUN/Cr ratio > 20. Urine output 0.45cc/kg/hr. Had renal US earlier in hospital stay which was unremarkable. Hypernatremia - Resolved. Hypophosphatemia - Resolved. Hypokalemia - Resolved. 3.5 this AM  Lactic Acidosis - Resolved. AGMA - Resolved.   P:   Monitoring UOP with Foley Trending Electrolytes and Renal function Free water 100ml q 4 hrs  Replacing electrolytes as indicated Kdur 30mEq x1  GASTROINTESTINAL A: Loose stools: c diff negative   P:   NPO  Continuing tube feedings Protonix VT daily  HEMATOLOGIC A: Leukocytosis -  Mildly elevated again.  Anemia - worsened overnight. S/p 1 unit pRBC 10/16. Hgb stable this AM. No signs of active bleeding  Coagulopathy - Possibly secondary to Eliquis vs Coumadin.  P:  Trending cell counts daily w/ CBC Trending Hgb/Hct q12hr Transfuse for Hgb <7.0 Heparin Drip  Consider re-imaging abdomen as abdomen seems more taut   ENDOCRINE A: H/O DM - A1c 10.6 in June 2017. Glucose elevated.  P:   Switched to Levemir 10/16: Levemir 28 U BID (increased from 25) Novolog 3 units q 4 hrs  SSI per Moderate Agorithm Accu-Checks q4hr  NEUROLOGIC A: Acute Encephalopathy - Multifactorial from hypoxia and probable toxic metabolic. Sedation on  Ventilator  P:   RASS goal: 0 to -1 Fentanyl gtt & IV prn pain & discomfort Versed IV prn sedation   INTEGUMENT:  A: Sacral Pressure Injury  P: - wound care: santyl with saline moistened gauze daily covered with dry gauze   FAMILY  - Inter-disciplinary family meet or Palliative Care meeting due by:  10/19  Palma HolterKanishka G Gunadasa, MD PGY 2 Family Medicine  04/26/2016  6:05 AM

## 2016-04-26 NOTE — Progress Notes (Signed)
ANTICOAGULATION CONSULT NOTE  Pharmacy Consult for Heparin Indication: atrial fibrillation  Allergies  Allergen Reactions  . Niaspan [Niacin Er] Hives and Other (See Comments)    Reaction:  Facial redness/hotness     Patient Measurements: Height: 5\' 10"  (177.8 cm) Weight: 234 lb 2.1 oz (106.2 kg) IBW/kg (Calculated) : 73  Vital Signs: Temp: 99 F (37.2 C) (10/17 0000) Temp Source: Core (Comment) (10/17 0000) BP: 89/63 (10/17 0000) Pulse Rate: 90 (10/17 0000)  Labs:  Recent Labs  04/23/16 1017 04/23/16 1230  04/24/16 0330  04/24/16 2315 04/25/16 0546 04/25/16 0605 04/25/16 1452 04/25/16 1842 04/25/16 2300  HGB  --   --   < >  --   < > 7.6* 7.8*  --  6.8*  --  8.0*  HCT  --   --   < >  --   < > 25.6* 25.9*  --  22.1*  --  26.0*  PLT  --   --   --   --   < > 170 199  --  181  --   --   HEPARINUNFRC  --  0.12*  --   --   --   --   --   --   --  <0.10* 0.10*  CREATININE 1.07  --   --  0.87  --   --   --  1.02  1.06  --   --   --   < > = values in this interval not displayed.  Estimated Creatinine Clearance: 88.2 mL/min (by C-G formula based on SCr of 1.06 mg/dL).  Assessment: 60 y.o. male with Afib for heparin  Plan: Increase Heparin 1750 units/hr Check heparin level in 8 hours.  Geannie RisenGreg Yonna Alwin, PharmD, BCPS

## 2016-04-26 NOTE — Progress Notes (Signed)
Inpatient Diabetes Program Recommendations  AACE/ADA: New Consensus Statement on Inpatient Glycemic Control (2015)  Target Ranges:  Prepandial:   less than 140 mg/dL      Peak postprandial:   less than 180 mg/dL (1-2 hours)      Critically ill patients:  140 - 180 mg/dL   Lab Results  Component Value Date   GLUCAP 189 (H) 04/26/2016   HGBA1C 10.6 (H) 01/08/2016    Review of Glycemic Control:  Results for [name], Calan K (MRN [id number]) as of 04/26/2016 10:11  Ref. Range 04/25/2016 15:47 04/25/2016 19:41 04/25/2016 23:56 04/26/2016 03:49 04/26/2016 07:49  Glucose-Capillary Latest Ref Range: 65 - 99 mg/dL 409243 (H) 811209 (H) 914250 (H) 229 (H) 189 (H)    Inpatient Diabetes Program Recommendations:    Note Levemir increased to 28 units bid.  May consider also increasing tube feed coverage to 5 units q 4 hours.    Thanks, Beryl MeagerJenny Levonte Molina, RN, BC-ADM Inpatient Diabetes Coordinator Pager [phone] (8a-5p)

## 2016-04-27 ENCOUNTER — Inpatient Hospital Stay (HOSPITAL_COMMUNITY): Payer: Medicare HMO

## 2016-04-27 LAB — GLUCOSE, CAPILLARY
GLUCOSE-CAPILLARY: 96 mg/dL (ref 65–99)
GLUCOSE-CAPILLARY: 124 mg/dL — AB (ref 65–99)
GLUCOSE-CAPILLARY: 141 mg/dL — AB (ref 65–99)
GLUCOSE-CAPILLARY: 130 mg/dL — AB (ref 65–99)
Glucose-Capillary: 76 mg/dL (ref 65–99)
Glucose-Capillary: 122 mg/dL — ABNORMAL HIGH (ref 65–99)

## 2016-04-27 LAB — HEPATIC FUNCTION PANEL
ALT: 58 U/L (ref 17–63)
AST: 80 U/L — AB (ref 15–41)
Albumin: 1.7 g/dL — ABNORMAL LOW (ref 3.5–5.0)
Alkaline Phosphatase: 170 U/L — ABNORMAL HIGH (ref 38–126)
BILIRUBIN DIRECT: 0.5 mg/dL (ref 0.1–0.5)
BILIRUBIN TOTAL: 1.1 mg/dL (ref 0.3–1.2)
Indirect Bilirubin: 0.6 mg/dL (ref 0.3–0.9)
Total Protein: 6 g/dL — ABNORMAL LOW (ref 6.5–8.1)

## 2016-04-27 LAB — CBC
HCT: 26.3 % — ABNORMAL LOW (ref 39.0–52.0)
Hemoglobin: 8 g/dL — ABNORMAL LOW (ref 13.0–17.0)
MCH: 27.1 pg (ref 26.0–34.0)
MCHC: 30.4 g/dL (ref 30.0–36.0)
MCV: 89.2 fL (ref 78.0–100.0)
PLATELETS: 278 10*3/uL (ref 150–400)
RBC: 2.95 MIL/uL — ABNORMAL LOW (ref 4.22–5.81)
RDW: 19.2 % — AB (ref 11.5–15.5)
WBC: 10.7 10*3/uL — AB (ref 4.0–10.5)

## 2016-04-27 LAB — RENAL FUNCTION PANEL
Albumin: 1.8 g/dL — ABNORMAL LOW (ref 3.5–5.0)
Anion gap: 10 (ref 5–15)
BUN: 65 mg/dL — ABNORMAL HIGH (ref 6–20)
CALCIUM: 8.2 mg/dL — AB (ref 8.9–10.3)
CO2: 24 mmol/L (ref 22–32)
CREATININE: 1.03 mg/dL (ref 0.61–1.24)
Chloride: 111 mmol/L (ref 101–111)
Glucose, Bld: 101 mg/dL — ABNORMAL HIGH (ref 65–99)
PHOSPHORUS: 3.4 mg/dL (ref 2.5–4.6)
Potassium: 3.4 mmol/L — ABNORMAL LOW (ref 3.5–5.1)
SODIUM: 145 mmol/L (ref 135–145)

## 2016-04-27 LAB — MAGNESIUM: MAGNESIUM: 2 mg/dL (ref 1.7–2.4)

## 2016-04-27 MED ORDER — FENTANYL CITRATE (PF) 100 MCG/2ML IJ SOLN
25.0000 ug | INTRAMUSCULAR | Status: DC | PRN
  Administered 2016-04-29 – 2016-05-07 (×14): 50 ug via INTRAVENOUS
  Filled 2016-04-27 (×14): qty 2

## 2016-04-27 MED ORDER — POTASSIUM CHLORIDE 20 MEQ/15ML (10%) PO SOLN
40.0000 meq | Freq: Two times a day (BID) | ORAL | Status: AC
  Administered 2016-04-27 (×2): 40 meq via ORAL
  Filled 2016-04-27 (×2): qty 30

## 2016-04-27 MED ORDER — HEPARIN (PORCINE) IN NACL 100-0.45 UNIT/ML-% IJ SOLN
2200.0000 [IU]/h | INTRAMUSCULAR | Status: DC
  Administered 2016-04-28: 2200 [IU]/h via INTRAVENOUS
  Administered 2016-04-28: 2000 [IU]/h via INTRAVENOUS
  Administered 2016-04-29: 2400 [IU]/h via INTRAVENOUS
  Administered 2016-04-30 – 2016-05-01 (×3): 2600 [IU]/h via INTRAVENOUS
  Administered 2016-05-01: 2200 [IU]/h via INTRAVENOUS
  Filled 2016-04-27 (×19): qty 250

## 2016-04-27 MED ORDER — DEXMEDETOMIDINE HCL IN NACL 200 MCG/50ML IV SOLN
0.2000 ug/kg/h | INTRAVENOUS | Status: DC
  Administered 2016-04-27 (×2): 0.6 ug/kg/h via INTRAVENOUS
  Administered 2016-04-27: 0.4 ug/kg/h via INTRAVENOUS
  Administered 2016-04-27: 0.6 ug/kg/h via INTRAVENOUS
  Administered 2016-04-27: 0.4 ug/kg/h via INTRAVENOUS
  Filled 2016-04-27 (×8): qty 50

## 2016-04-27 MED ORDER — HEPARIN (PORCINE) IN NACL 100-0.45 UNIT/ML-% IJ SOLN: 2000.0000 [IU]/h | INTRAMUSCULAR | Status: DC

## 2016-04-27 NOTE — Progress Notes (Signed)
PULMONARY / CRITICAL CARE MEDICINE   Name: Clayton [name] MRN: [id number] DOB: 1956/02/28    ADMISSION DATE:  04/18/2016  REFERRING MD:  Rubin PayorPickering (EDP)   CHIEF COMPLAINT:  Sepsis, respiratory failure   HISTORY OF PRESENT ILLNESS:   60yo male SNF resident with hx CHF, CAD s/p remote stent, DM, HTN, Aflutter s/p ablation on coumadin, severe PVD s/p R BKA, being treated for unknown infection at SNF with rocephin.  Presented 10/12 from SNF after being found unresponsive with agonal breaths.  Bagged by EMS en route and intubated on arrival to ER.  In ER was febrile to 104, hypotensive with SBP 70's.  Labs notable for AKI with K 7.1, glucose 500, WBC 36, lactate 3.6.  PCCM consulted for admit.   SUBJECTIVE: NO acute events overnight. Patient's cbg downtrended overnight and PM Levemir was held. HR in the upper 90s to low 100s. MAP mainly above 65 with SBP intermittently in the mid to upper 80s and above. Attempted SBT 10/17 but had tachycardia and tachypnea. No concerns from nursing other than some agitation.   REVIEW OF SYSTEMS:  Answers questions intermittently. Reports of no chest pain or abdominal pain but with palpation of RUQ winces.   VITAL SIGNS: BP 108/76   Pulse (!) 102   Temp 97.9 F (36.6 C)   Resp (!) 22   Ht 5\' 10"  (1.778 m)   Wt 108.5 kg (239 lb 3.2 oz)   SpO2 100%   BMI 34.32 kg/m   HEMODYNAMICS:    VENTILATOR SETTINGS: Vent Mode: PRVC FiO2 (%):  [40 %] 40 % Set Rate:  [22 bmp] 22 bmp Vt Set:  [580 mL] 580 mL PEEP:  [5 cmH20] 5 cmH20 Pressure Support:  [10 cmH20] 10 cmH20 Plateau Pressure:  [23 cmH20-28 cmH20] 23 cmH20  INTAKE / OUTPUT: I/O last 3 completed shifts: In: 4244.5 [I.V.:1899.5; Blood:420; WUJWJ:191Other:225; NG/GT:1300; IV Piggyback:400] Out: 1775 [Urine:1725; Stool:50]  PHYSICAL EXAMINATION:  General:  NAD Neuro:  Sedated. Eyes open spontaneously and follows some commands . HEENT:  ETT in place. No scleral icterus or injection. Cardiovascular:   Irregular rhythm  No appreciable JVD. Lungs: coarse breath sounds. Symmetric chest rise on ventilator.  Abdomen:  Seems tight. Protuberant. Tenderness in RUQ.  Somewhat hypoactive bowel sounds. Flexi-Seal rectal tube in place for stool. Musculoskeletal:  Warm and dry. 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  LABS:  BMET  Recent Labs Lab 04/26/16 0500 04/26/16 1410 04/27/16 0457  NA 141 143 145  K 3.5 3.7 3.4*  CL 107 108 111  CO2 25 24 24   BUN 73* 73* 65*  CREATININE 1.32* 1.25* 1.03  GLUCOSE 218* 124* 101*    Electrolytes  Recent Labs Lab 04/25/16 0605 04/26/16 0500 04/26/16 1410 04/27/16 0456 04/27/16 0457  CALCIUM 7.9*  7.9* 7.9* 7.9*  --  8.2*  MG 2.1 2.0  --  2.0  --   PHOS 3.3 3.0  --   --  3.4    CBC  Recent Labs Lab 04/26/16 0500 04/26/16 1914 04/27/16 0456  WBC 11.7* 10.9* 10.7*  HGB 7.9* 7.6* 8.0*  HCT 25.6* 24.4* 26.3*  PLT 203 227 278    Coag's  Recent Labs Lab 05/05/2016 0937  APTT 34  INR 1.76    Sepsis Markers  Recent Labs Lab 05/02/2016 0937  04/22/16 0441  04/22/16 1314 04/22/16 1827 04/23/16 0000 04/23/16 0343  LATICACIDVEN 4.0*  < > 2.5*  < > 2.7* 1.9 1.9  --   PROCALCITON 1.97  --  24.88  --   --   --   --  14.94  < > = values in this interval not displayed.  ABG  Recent Labs Lab 04/29/2016 1017 04/22/16 0803  PHART 7.270* 7.424  PCO2ART 52.6* 40.0  PO2ART 251.0* 75.0*    Liver Enzymes  Recent Labs Lab 05/01/2016 0737 04/16/2016 0937  04/25/16 0605 04/26/16 0500 04/27/16 0457  AST 32 30  --   --   --   --   ALT 40 34  --   --   --   --   ALKPHOS 91 83  --   --   --   --   BILITOT 1.1 1.6*  --   --   --   --   ALBUMIN 3.5 2.9*  < > 2.0* 1.8* 1.8*  < > = values in this interval not displayed.  Cardiac Enzymes  Recent Labs Lab 05/05/2016 0937 04/20/2016 1510 05/07/2016 2050  TROPONINI 0.06* 0.06* 0.07*    Glucose  Recent Labs Lab 04/26/16 1139 04/26/16 1410 04/26/16 1543 04/26/16 1959  04/26/16 2337 04/27/16 0345  GLUCAP 175* 148* 129* 74 98 76    Imaging No results found.   STUDIES:  Renal U/S 10/12:  No abnormality or hydronephrosis.  TTE 10/13:  Difficult study. Mild LVH w/ EF 55%. Possible akinesis apical myocardium without obvious thrombus.Moderate aortic stenosis w/ mild mitral stenosis. RV poorly visualized. CT abdomen 10/15: no evidence for retroperitoneal hemorrhage. RLL central airspace disease compatible with aspiration and/or PNA CXR: 10/17: unchanged from 10/16. No consolidation. Final read pending.   MICROBIOLOGY: MRSA PCR 10/12:  Negative Blood Ctx x2 10/12 >> 1/2 Coag Neg Staph; 1/2 NG x 5 days  Urine Ctx 10/12:  Negative  Tracheal Asp Ctx 10/13 >> MRSA Respiratory Viral Panel PCR 10/13:  Negative   ANTIBIOTICS: Zosyn 10/12 x1 dose; 10/16 >> Vancomycin 10/12 >>10/17 Cefepime 10/12 >> 10/16  SIGNIFICANT EVENTS: 10/12 - Admit & intubation in ED 10/14 - switched from Levo to Neo due to intermittent tachycardia  10/15 - off pressor  LINES/TUBES: OETT 7.5 10/12 >>  RUE PICC (SNF) 10/11 >>   Foley 10/12 >> PEG >> PIV x1  ASSESSMENT / PLAN:  Mr. Heinbaugh is a  60 y.o. male with PMH CHF, CAD s/p remote stent, DM, HTN, Aflutter s/p ablation on anticoagulation, severe PVD s/p R BKA. Presenting with septic shock likely from MRSA PNA. Was on IV heparin due to history of Aflutter/Afib but held again on 10/17 due to possible abdominal tenderness although hemoglobin had been stable. Cardizem is currently at a lower dose than home to help with borderline blood pressures, which have somewhat improved. Attempted SBT 10/17 but did not pass. CBGs lower end of normal since tube feeds were stopped therefore will hold Levemir and tube feed coverage for now and monitor. Had some RUQ tenderness to palpation and abdomen seems slightly taut.  Will obtain LFT and possibly imaging to further evaluate.  INFECTIOUS A: Severe Sepsis, improving  RLL MRSA PNA: tracheal  aspirate culture with MRSA   P:   Vancomycin (Day #7) S/p Cefepime and Zosyn  PULMONARY A: Acute Hypoxic Respiratory Failure - RLL central airspace disease compatible with aspiration/PNA on Imaging (CT). Improving.  Mild Vascular congestion in CXR   P:   Full Vent Support  Daily PS Wean & SBT Intermittent CXR & ABG Albuterol nebs prn AM CXR pending  CARDIOVASCULAR A: Shock - Likely septic. Resolved and off presors. However borderline MAPs since  restarting Cardizem which has somewhat improved with lowering Cardizem dose  Elevated Troponin I - Likely demand ischemia. Echo was difficult to interpret. Questionable apical akinesis. Aortic Stenosis - Seen on poor quality TTE. SVT - Questionable Atrial flutter. H/O CAD w/ Stent H/O CHF - Dilated LV w/ decreased function November 2016. H/O A Flutter - S/P ablation   P:  Cardizem 15mg  q 6 hours  Heparin drip (held since 10/17) Continuous telemetry monitoring Vitals per unit protocol Holding home lasix, metoprolol, lisinopril, & eliquis  Goal MAP >65 & SBP >90 ASA 81mg  VT daily  RENAL A: Acute Renal Failure - Creatinine improved with lasix x 1. Urine output adequate (0.58 cc/kg/hr). Had renal US earlier in hospital stay which was unremarkable. Hypernatremia - Resolved. Hypophosphatemia - Resolved. Hypokalemia - Resolved. 3.4 this AM  Lactic Acidosis - Resolved. AGMA - Resolved.   P:   Monitoring UOP with Foley Trending Electrolytes and Renal function Free water q 4 hrs  Replacing electrolytes as indicated Kdur BID x 1 day   GASTROINTESTINAL A: Loose stools: c diff negative  RUQ tenderness - new  P:   NPO  Continuing tube feedings Protonix VT daily LFTs today. If elevated will likely obtain RUQ [place] to further evaluate.   HEMATOLOGIC A: Leukocytosis -  Downtrending; in the setting of pneumonia   Anemia - stable hgb. S/p 1 unit pRBC 10/16. Hgb stable this AM. No signs of active bleeding  Coagulopathy  - Possibly secondary to Eliquis vs Coumadin.  P:  Trending cell counts daily w/ CBC Trending Hgb/Hct q12hr Transfuse for Hgb <7.0 Heparin Drip (held since 10/18)  ENDOCRINE A: H/O DM - A1c 10.6 in June 2017. Glucose is too tightly controlled and borderline low  P:   Levemir and tube feed coverage held SSI per Moderate Agorithm Accu-Checks q4hr  NEUROLOGIC A: Acute Encephalopathy - Multifactorial from hypoxia and probable toxic metabolic. Sedation on Ventilator  P:   RASS goal: 0 to -1 Fentanyl gtt & IV prn pain & discomfort Versed IV prn sedation   INTEGUMENT:  A: Sacral Pressure Injury  P: - wound care: santyl with saline moistened gauze daily covered with dry gauze   FAMILY  - Inter-disciplinary family meet or Palliative Care meeting due by:  10/19  Palma Holter, MD PGY 2 Family Medicine  04/27/2016  6:13 AM

## 2016-04-27 NOTE — Progress Notes (Deleted)
eLink Nursing ICU Electrolyte Replacement Protocol  Patient Name: Clayton [name] DOB: Feb 14, 1956 MRN: [id number]  Date of Service  04/27/2016   HPI/Events of Note    Recent Labs Lab 04/23/16 0343  04/24/16 0330 04/24/16 0350 04/25/16 0605 04/26/16 0500 04/26/16 1410 04/27/16 0456 04/27/16 0457  NA 145  < > 143  --  140  139 141 143  --  145  K 3.0*  < > 4.1  --  4.5  4.4 3.5 3.7  --  3.4*  CL 108  < > 107  --  107  106 107 108  --  111  CO2 28  < > 28  --  24  25 25 24   --  24  GLUCOSE 174*  < > 171*  --  230*  234* 218* 124*  --  101*  BUN 56*  < > 58*  --  58*  57* 73* 73*  --  65*  CREATININE 1.08  < > 0.87  --  1.02  1.06 1.32* 1.25*  --  1.03  CALCIUM 8.0*  < > 8.0*  --  7.9*  7.9* 7.9* 7.9*  --  8.2*  MG 2.2  --   --  2.2 2.1 2.0  --  2.0  --   PHOS 1.9*  --  3.8  --  3.3 3.0  --   --  3.4  < > = values in this interval not displayed.  Estimated Creatinine Clearance: 91.7 mL/min (by C-G formula based on SCr of 1.03 mg/dL).  Intake/Output      10/17 0701 - 10/18 0700   I.V. (mL/kg) 1058.1 (9.8)   Other 350   NG/GT 300   IV Piggyback 200   Total Intake(mL/kg) 1908.1 (17.6)   Urine (mL/kg/hr) 1500 (0.6)   Stool 50 (0)   Total Output 1550   Net +358.1        - I/O DETAILED x24h    Total I/O In: 1072.8 [I.V.:547.8; Other:125; NG/GT:300; IV Piggyback:100] Out: 890 [Urine:890] - I/O THIS SHIFT    ASSESSMENT   eICURN Interventions  K+ 3.4 replaced using ICU protocol   ASSESSMENT: MAJOR ELECTROLYTE    Merita NortonFrye, Ambyr Qadri Nicole 04/27/2016, 6:36 AM

## 2016-04-27 NOTE — Progress Notes (Signed)
Nutrition Follow-up  DOCUMENTATION CODES:   Obesity unspecified  INTERVENTION:    Resume TF as able: Vital High Protein at 50 ml/h (1200 ml per day) and Prostat 30 ml TID to provide 1500 kcals, 150 gm protein, 1003 ml free water daily.  NUTRITION DIAGNOSIS:   Inadequate oral intake related to inability to eat as evidenced by NPO status.  Ongoing  GOAL:   Provide needs based on ASPEN/SCCM guidelines  Unmet  MONITOR:   Vent status, Labs, Weight trends, TF tolerance, Skin, I & O's  ASSESSMENT:   60 yo male SNF resident with hx CHF, CAD s/p remote stent, DM, HTN, A flutter s/p ablation on coumadin, severe PVD s/p R BKA, being treated for unknown infection at SNF with rocephin.  Presented 10/12 from SNF after being found unresponsive with agonal breaths. Required intubation.  Discussed patient in ICU rounds and with RN today. Patient failed wean this morning. TF on hold due to RUQ tenderness. Abdominal scan has been ordered. Patient is currently intubated on ventilator support Temp (24hrs), Avg:98.8 F (37.1 C), Min:97.2 F (36.2 C), Max:100.2 F (37.9 C)   Diet Order:  Diet NPO time specified  Skin:  Wound (see comment) (DTI to sacrum)  Last BM:  10/17  Height:   Ht Readings from Last 1 Encounters:  04/20/2016 5\' 10"  (1.778 m)    Weight:   Wt Readings from Last 1 Encounters:  04/27/16 239 lb 3.2 oz (108.5 kg)    Ideal Body Weight:  75.5 kg  BMI:  Body mass index is 34.32 kg/m.  Estimated Nutritional Needs:   Kcal:  [id number]  Protein:  >/= 150 gm  Fluid:  2 L  EDUCATION NEEDS:   No education needs identified at this time  Joaquin CourtsKimberly Harris, RD, LDN, CNSC Pager [phone] After Hours Pager [phone]

## 2016-04-27 NOTE — Care Management Note (Signed)
Case Management Note  Patient Details  Name: Clayton [name] MRN: [id number] Date of Birth: [birth year]  Subjective/Objective:    Pt admitted with sepsis                Action/Plan:  Per H&P pt is from SNF or Assisted Living.  Pt is intubated without family at bedside.  CM unable to reach next of kin via phone.  CSW consulted 05/03/2016 by attending.  CM will verbally communicate with CSW.  CM will continue to follow for discharge needs   Expected Discharge Date:                  Expected Discharge Plan:  Skilled Nursing Facility  In-House Referral:  Clinical Social Work  Discharge planning Services  CM Consult  Post Acute Care Choice:    Choice offered to:     DME Arranged:    DME Agency:     HH Arranged:    HH Agency:     Status of Service:  In process, will continue to follow  If discussed at Long Length of Stay Meetings, dates discussed:    Additional Comments: CM consulted with CSW and was informed [facility]  Pt remains intubated and will likely be SNF consult - pt previously recommended for SNF but declined on prev admission. Clayton [name], Clayton Meador S, RN 04/27/2016, 11:02 AM

## 2016-04-27 NOTE — Progress Notes (Signed)
RUQ U/S ordered for tenderness on exam and elevated AST and decreasing hgb. Plan to initiate heparin drip for afib/aflutter once RUQ U/S results available.

## 2016-04-27 NOTE — Progress Notes (Signed)
ANTICOAGULATION CONSULT NOTE - Follow Up Consult  Pharmacy Consult for Heparin Indication: atrial fibrillation  Allergies  Allergen Reactions  . Niaspan [Niacin Er] Hives and Other (See Comments)    Reaction:  Facial redness/hotness     Patient Measurements: Height: 5' 10" (177.8 cm) Weight: 239 lb 3.2 oz (108.5 kg) IBW/kg (Calculated) : 73  Vital Signs: Temp: 99.1 F (37.3 C) (10/18 0800) BP: 123/68 (10/18 0800) Pulse Rate: 94 (10/18 0800)  Labs:  Recent Labs  04/25/16 1842 04/25/16 2300 04/26/16 0500 04/26/16 1015 04/26/16 1410 04/26/16 1914 04/27/16 0456 04/27/16 0457  HGB  --  8.0* 7.9*  --   --  7.6* 8.0*  --   HCT  --  26.0* 25.6*  --   --  24.4* 26.3*  --   PLT  --   --  203  --   --  227 278  --   HEPARINUNFRC <0.10* 0.10*  --  0.12*  --   --   --   --   CREATININE  --   --  1.32*  --  1.25*  --   --  1.03    Estimated Creatinine Clearance: 91.7 mL/min (by C-G formula based on SCr of 1.03 mg/dL).   Medications:  Scheduled:  . aspirin  81 mg Oral Daily  . chlorhexidine gluconate (MEDLINE KIT)  15 mL Mouth Rinse BID  . collagenase   Topical Daily  . diltiazem  15 mg Per Tube Q6H  . free water  100 mL Per Tube Q4H  . insulin aspart  0-15 Units Subcutaneous Q4H  . mouth rinse  15 mL Mouth Rinse QID  . pantoprazole sodium  40 mg Per Tube Q1200  . potassium chloride  40 mEq Oral BID WC  . vancomycin  500 mg Intravenous Q12H    Assessment: A 60 year old male with atrial flutter on warfarin PTA now on heparin per pharmacy dosing. Started Heparin drip on 10/12 and was subtherapeutic. Heparin put on hold 10/14 due to decrease Hgb and PLT. Heparin resumed 10/16 and remained subtherapeutic (0.12) on Heparin drip 1750 units/hr. Heparin was held 10/17 due to concern about low Hgb of 7.6 and pain upon abdominal exam. Today, Hgb stable and no signs/symptoms of bleeding noted so Heparin will be resumed with no bolus.  Goal of Therapy: Heparin level 0.3 - 0.7  units/mL Monitor platelets by anticoagulation protocol: Yes  Plan:  - No Heparin bolus - Restart Heparin at increased dose of 2000 units/hr - Check Heparin level in 6 hours. - Follow daily heparin level, CBC, S/Sx bleeding  Karlyn Agee, PharmD Candidate 04/27/2016, 10:24 AM

## 2016-04-28 ENCOUNTER — Inpatient Hospital Stay (HOSPITAL_COMMUNITY): Payer: Medicare HMO

## 2016-04-28 LAB — GLUCOSE, CAPILLARY
GLUCOSE-CAPILLARY: 175 mg/dL — AB (ref 65–99)
GLUCOSE-CAPILLARY: 225 mg/dL — AB (ref 65–99)
Glucose-Capillary: 131 mg/dL — ABNORMAL HIGH (ref 65–99)
Glucose-Capillary: 142 mg/dL — ABNORMAL HIGH (ref 65–99)
Glucose-Capillary: 224 mg/dL — ABNORMAL HIGH (ref 65–99)

## 2016-04-28 LAB — RENAL FUNCTION PANEL
Albumin: 1.8 g/dL — ABNORMAL LOW (ref 3.5–5.0)
Anion gap: 8 (ref 5–15)
BUN: 47 mg/dL — AB (ref 6–20)
CHLORIDE: 117 mmol/L — AB (ref 101–111)
CO2: 23 mmol/L (ref 22–32)
CREATININE: 1.02 mg/dL (ref 0.61–1.24)
Calcium: 8.3 mg/dL — ABNORMAL LOW (ref 8.9–10.3)
GFR calc Af Amer: >60 mL/min (ref >60)
Glucose, Bld: 147 mg/dL — ABNORMAL HIGH (ref 65–99)
POTASSIUM: 4 mmol/L (ref 3.5–5.1)
Phosphorus: 3 mg/dL (ref 2.5–4.6)
Sodium: 148 mmol/L — ABNORMAL HIGH (ref 135–145)

## 2016-04-28 LAB — CBC
HEMATOCRIT: 25.1 % — AB (ref 39.0–52.0)
HEMOGLOBIN: 7.6 g/dL — AB (ref 13.0–17.0)
MCH: 27.2 pg (ref 26.0–34.0)
MCHC: 30.3 g/dL (ref 30.0–36.0)
MCV: 90 fL (ref 78.0–100.0)
Platelets: 263 10*3/uL (ref 150–400)
RBC: 2.79 MIL/uL — ABNORMAL LOW (ref 4.22–5.81)
RDW: 19 % — ABNORMAL HIGH (ref 11.5–15.5)
WBC: 7.6 10*3/uL (ref 4.0–10.5)

## 2016-04-28 LAB — MAGNESIUM: MAGNESIUM: 2 mg/dL (ref 1.7–2.4)

## 2016-04-28 LAB — LIPASE, BLOOD: Lipase: 14 U/L (ref 11–51)

## 2016-04-28 LAB — HEPARIN LEVEL (UNFRACTIONATED)
HEPARIN UNFRACTIONATED: 0.25 [IU]/mL — AB (ref 0.30–0.70)
HEPARIN UNFRACTIONATED: 0.26 [IU]/mL — AB (ref 0.30–0.70)

## 2016-04-28 LAB — AMYLASE: AMYLASE: 17 U/L — AB (ref 28–100)

## 2016-04-28 MED ORDER — FREE WATER
200.0000 mL | Status: DC
  Administered 2016-04-28 – 2016-04-29 (×5): 200 mL

## 2016-04-28 MED ORDER — FREE WATER
300.0000 mL | Status: DC
  Administered 2016-04-28: 300 mL

## 2016-04-28 MED ORDER — FUROSEMIDE 10 MG/ML IJ SOLN
40.0000 mg | Freq: Three times a day (TID) | INTRAMUSCULAR | Status: DC
  Administered 2016-04-28 (×3): 40 mg via INTRAVENOUS
  Filled 2016-04-28 (×5): qty 4

## 2016-04-28 MED ORDER — DEXMEDETOMIDINE HCL IN NACL 400 MCG/100ML IV SOLN
0.2000 ug/kg/h | INTRAVENOUS | Status: DC
  Administered 2016-04-28: 0.6 ug/kg/h via INTRAVENOUS
  Administered 2016-04-28 – 2016-04-29 (×7): 0.7 ug/kg/h via INTRAVENOUS
  Administered 2016-04-29: 0.5 ug/kg/h via INTRAVENOUS
  Administered 2016-04-30 – 2016-05-02 (×10): 0.7 ug/kg/h via INTRAVENOUS
  Administered 2016-05-03: 0.6 ug/kg/h via INTRAVENOUS
  Administered 2016-05-03: 0.4 ug/kg/h via INTRAVENOUS
  Administered 2016-05-03: 0.5 ug/kg/h via INTRAVENOUS
  Filled 2016-04-28 (×7): qty 100
  Filled 2016-04-28: qty 50
  Filled 2016-04-28 (×3): qty 100
  Filled 2016-04-28: qty 50
  Filled 2016-04-28 (×2): qty 100
  Filled 2016-04-28: qty 50
  Filled 2016-04-28 (×8): qty 100
  Filled 2016-04-28: qty 50
  Filled 2016-04-28: qty 100

## 2016-04-28 MED ORDER — DEXMEDETOMIDINE HCL IN NACL 400 MCG/100ML IV SOLN
0.2000 ug/kg/h | INTRAVENOUS | Status: DC
  Administered 2016-04-28 (×2): 0.6 ug/kg/h via INTRAVENOUS
  Administered 2016-04-28: 0.5 ug/kg/h via INTRAVENOUS
  Filled 2016-04-28 (×2): qty 100

## 2016-04-28 MED ORDER — VITAL HIGH PROTEIN PO LIQD
1000.0000 mL | ORAL | Status: DC
  Administered 2016-04-28 – 2016-05-07 (×9): 1000 mL
  Filled 2016-04-28: qty 1000

## 2016-04-28 NOTE — Progress Notes (Signed)
PULMONARY / CRITICAL CARE MEDICINE   Name: Clayton [name] MRN: [id number] DOB: June 01, 1956    ADMISSION DATE:  2016/05/02  REFERRING MD:  Rubin Payor (EDP)   CHIEF COMPLAINT:  Sepsis, respiratory failure   HISTORY OF PRESENT ILLNESS:   60yo male SNF resident with hx CHF, CAD s/p remote stent, DM, HTN, Aflutter s/p ablation on coumadin, severe PVD s/p R BKA, being treated for unknown infection at SNF with rocephin.  Presented 10/12 from SNF after being found unresponsive with agonal breaths.  Bagged by EMS en route and intubated on arrival to ER.  In ER was febrile to 104, hypotensive with SBP 70's.  Labs notable for AKI with K 7.1, glucose 500, WBC 36, lactate 3.6.  PCCM consulted for admit.   SUBJECTIVE: NO acute events overnight. RT attempted to wean this AM but failed due to increased RR and low Vt  So switched back to full support. Fever curve is improving with tmax of 100.9 over 24 hours. HR controlled BP improved and stable   REVIEW OF SYSTEMS:  Answers questions intermittently. VITAL SIGNS: BP 129/78   Pulse 69   Temp 100.2 F (37.9 C)   Resp (!) 22   Ht 5\' 10"  (1.778 m)   Wt 108.5 kg (239 lb 3.2 oz)   SpO2 100%   BMI 34.32 kg/m   HEMODYNAMICS:    VENTILATOR SETTINGS: Vent Mode: PSV;CPAP FiO2 (%):  [40 %-40.9 %] 40 % Set Rate:  [21 bmp-22 bmp] 22 bmp Vt Set:  [580 mL] 580 mL PEEP:  [5 cmH20] 5 cmH20 Pressure Support:  [5 cmH20] 5 cmH20 Plateau Pressure:  [22 cmH20-25 cmH20] 23 cmH20  INTAKE / OUTPUT: I/O last 3 completed shifts: In: 2150.2 [I.V.:1120.2; Other:125; NG/GT:705; IV Piggyback:200] Out: 2650 [Urine:2450; Stool:200]  PHYSICAL EXAMINATION:  General:  NAD Neuro:  Sedated. Eyes open spontaneously and follows some commands . HEENT:  ETT in place. No scleral icterus or injection. Cardiovascular:  Irregular rhythm  No appreciable JVD. Lungs: coarse breath sounds. Symmetric chest rise on ventilator.  Abdomen:  Somewhat hypoactive bowel sounds, nontender to  palpation today,  Flexi-Seal rectal tube in place for stool. Musculoskeletal:  Warm and dry. 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  LABS:  BMET  Recent Labs Lab 04/26/16 1410 04/27/16 0457 04/28/16 0430  NA 143 145 148*  K 3.7 3.4* 4.0  CL 108 111 117*  CO2 24 24 23   BUN 73* 65* 47*  CREATININE 1.25* 1.03 1.02  GLUCOSE 124* 101* 147*    Electrolytes  Recent Labs Lab 04/26/16 0500 04/26/16 1410 04/27/16 0456 04/27/16 0457 04/28/16 0430  CALCIUM 7.9* 7.9*  --  8.2* 8.3*  MG 2.0  --  2.0  --  2.0  PHOS 3.0  --   --  3.4 3.0    CBC  Recent Labs Lab 04/26/16 1914 04/27/16 0456 04/28/16 0430  WBC 10.9* 10.7* 7.6  HGB 7.6* 8.0* 7.6*  HCT 24.4* 26.3* 25.1*  PLT 227 278 263    Coag's  Recent Labs Lab 2016-05-02 0937  APTT 34  INR 1.76    Sepsis Markers  Recent Labs Lab 05-02-2016 0937  04/22/16 0441  04/22/16 1314 04/22/16 1827 04/23/16 0000 04/23/16 0343  LATICACIDVEN 4.0*  < > 2.5*  < > 2.7* 1.9 1.9  --   PROCALCITON 1.97  --  24.88  --   --   --   --  14.94  < > = values in this interval not displayed.  ABG  Recent Labs Lab 04/28/2016 1017 04/22/16 0803  PHART 7.270* 7.424  PCO2ART 52.6* 40.0  PO2ART 251.0* 75.0*    Liver Enzymes  Recent Labs Lab 04/17/2016 0737 04/23/2016 0937  04/27/16 0457 04/27/16 0842 04/28/16 0430  AST 32 30  --   --  80*  --   ALT 40 34  --   --  58  --   ALKPHOS 91 83  --   --  170*  --   BILITOT 1.1 1.6*  --   --  1.1  --   ALBUMIN 3.5 2.9*  < > 1.8* 1.7* 1.8*  < > = values in this interval not displayed.  Cardiac Enzymes  Recent Labs Lab 04/18/2016 0937 05/04/2016 1510 05/03/2016 2050  TROPONINI 0.06* 0.06* 0.07*    Glucose  Recent Labs Lab 04/27/16 0731 04/27/16 1112 04/27/16 1508 04/27/16 1935 04/27/16 2313 04/28/16 0359  GLUCAP 96 124* 122* 141* 130* 131*    Imaging Dg Chest Port 1 View  Result Date: 04/27/2016 CLINICAL DATA:  Endotracheal tube EXAM: PORTABLE CHEST 1 VIEW  COMPARISON:  04/26/2016 FINDINGS: Endotracheal tube remains in good position. Right arm PICC tip in the right innominate vein unchanged. Increase in mild bibasilar airspace disease most likely atelectasis due to under aeration of the lungs. No effusion or edema. IMPRESSION: Hypoventilation with increase in bibasilar atelectasis. Support lines unchanged in position. Electronically Signed   By: Marlan Palau M.D.   On: 04/27/2016 09:28   US Abdomen Limited Ruq  Result Date: 04/28/2016 CLINICAL DATA:  60 year old male with right upper quadrant abdominal pain and elevated LFTs. EXAM: US ABDOMEN LIMITED - RIGHT UPPER QUADRANT COMPARISON:  None. FINDINGS: Gallbladder: There is sludge and stone within the gallbladder. No gallbladder wall thickening or pericholecystic fluid. Negative sonographic Murphy's sign. Common bile duct: Diameter: 4 mm Liver: Increased hepatic echotexture compatible with fatty infiltration. IMPRESSION: Cholelithiasis without sonographic evidence acute cholecystitis. A hepatobiliary scintigraphy may provide better evaluation of the gallbladder if an acute cholecystitis is clinically suspected. Fatty liver. Electronically Signed   By: Elgie Collard M.D.   On: 04/28/2016 03:26     STUDIES:  Renal U/S 10/12:  No abnormality or hydronephrosis.  TTE 10/13:  Difficult study. Mild LVH w/ EF 55%. Possible akinesis apical myocardium without obvious thrombus.Moderate aortic stenosis w/ mild mitral stenosis. RV poorly visualized. CT abdomen 10/15: no evidence for retroperitoneal hemorrhage. RLL central airspace disease compatible with aspiration and/or PNA CXR: 10/17: unchanged from 10/16. No consolidation. Final read pending.  RUQ [place] 10/18: Cholelithiasis without sonographic evidence acute cholecystitis. A hepatobiliary scintigraphy may provide better evaluation of the gallbladder if an acute cholecystitis is clinically suspected. Fatty Liver.   MICROBIOLOGY: MRSA PCR 10/12:   Negative Blood Ctx x2 10/12 >> 1/2 Coag Neg Staph; 1/2 NG x 5 days  Urine Ctx 10/12:  Negative  Tracheal Asp Ctx 10/13 >> MRSA Respiratory Viral Panel PCR 10/13:  Negative   ANTIBIOTICS: Zosyn 10/12 x1 dose; 10/16 >>10/17 Vancomycin 10/12 >>10/18 Cefepime 10/12 >> 10/16  SIGNIFICANT EVENTS: 10/12 - Admit & intubation in ED 10/14 - switched from Levo to Neo due to intermittent tachycardia  10/15 - off pressor 10/17 - Failed wean 10/19 - failed wean   LINES/TUBES: OETT 7.5 10/12 >>  RUE PICC (SNF) 10/11 >>   Foley 10/12 >> PEG >> PIV x1  ASSESSMENT / PLAN:  Mr. Mceachern is a  60 y.o. male with PMH CHF, CAD s/p remote stent, DM, HTN, Aflutter s/p ablation on anticoagulation,  severe PVD s/p R BKA. Presenting with septic shock likely from MRSA PNA. Is on Heparin for history of a flutter.  Attempted SBT 10/18 but did not pass; Possibly fluid overloaded therefore will start IV Lasix.   INFECTIOUS A: Severe Sepsis, improving  RLL MRSA PNA: tracheal aspirate culture with MRSA   P:   S/p Vancomycin for 7 days  S/p Cefepime and Zosyn  PULMONARY A: Acute Hypoxic Respiratory Failure - RLL central airspace disease compatible with aspiration/PNA on Imaging (CT). Treated with abx.   P:   Full Vent Support  Daily PS Wean & SBT Intermittent CXR & ABG Albuterol nebs prn   CARDIOVASCULAR A: Shock - Likely septic. Resolved and off presors.  Elevated Troponin I - Likely demand ischemia. Echo was difficult to interpret. Questionable apical akinesis. Aortic Stenosis - Seen on poor quality TTE. SVT - Questionable Atrial flutter, HR within normal limits H/O CAD w/ Stent H/O CHF - Dilated LV w/ decreased function November 2016. H/O A Flutter - S/P ablation   P:  Cardizem 15mg  q 6 hours  Heparin drip (restarted 10/19) Continuous telemetry monitoring Vitals per unit protocol Holding home lasix, metoprolol, lisinopril, & eliquis  Goal MAP >65 & SBP >90 ASA 81mg  VT daily Lasix IV 40mg   TID starting today   RENAL A: Acute Renal Failure - Cr stable. Had renal US earlier in hospital stay which was unremarkable. Hypernatremia - 148 this AM Hypophosphatemia - Resolved Hypokalemia - Resolved Lactic Acidosis - Resolved. AGMA - Resolved.   P:   Monitoring UOP with Foley Trending Electrolytes and Renal function Free water 200ml q 4 hrs  Replacing electrolytes as indicated  GASTROINTESTINAL A: Loose stools: c diff negative  RUQ tenderness : RUQ US with cholelithiasis without sonographic evidence of cholecystitis. Tenderness resolved.   P:   NPO  Tube feeds  Protonix VT daily  HEMATOLOGIC A: Leukocytosis -  resolved; in the setting of pneumonia   Anemia - stable hgb. S/p 1 unit pRBC 10/16. Hgb stable this AM. No signs of active bleeding  Coagulopathy - Possibly secondary to Eliquis vs Coumadin.  P:  Trending cell counts daily w/ CBC Trending Hgb/Hct q12hr Transfuse for Hgb <7.0 Heparin Drip (held since 10/18)  ENDOCRINE A: H/O DM - A1c 10.6 in June 2017. Glucose is too tightly controlled and borderline low  P:   Levemir and tube feed coverage held SSI per Moderate Agorithm Accu-Checks q4hr  NEUROLOGIC A: Acute Encephalopathy - Multifactorial from hypoxia and probable toxic metabolic. Sedation on Ventilator  P:   RASS goal: 0 to -1 Precedex gtt  Versed IV prn sedation   INTEGUMENT:  A: Sacral Pressure Injury  P: - wound care: santyl with saline moistened gauze daily covered with dry gauze   FAMILY  - Inter-disciplinary family meet or Palliative Care meeting due by:  10/19  Palma HolterKanishka G Gunadasa, MD PGY 2 Family Medicine  04/28/2016  7:07 AM

## 2016-04-28 NOTE — Progress Notes (Signed)
Interim Note:   Called son Nolon BussingSteven Fung to update: patient completed treatment for MRSA Pneumonia. He was not able to be weaned today but possibly due to positive fluid status and we were giving Lasix to possibly help with wean.   Palma HolterKanishka G Jules Baty PGY 2 Family Medicine

## 2016-04-28 NOTE — Progress Notes (Signed)
STAFF NOTE: I, Dr Lavinia SharpsM Kynesha Guerin have personally reviewed patient's available data, including medical history, events of note, physical examination and test results as part of my evaluation. I have discussed with resident/NP and other care providers such as pharmacist, RN and RRT.  In addition,  I personally evaluated patient and elicited key findings of   S: failed SBT but was done on precedex. NEt 19L positive since admit 05/07/2016 (on home lasix 20mg  daily). Low grade fever +. WBC improving   O: deconditioned male Slow to respond On precedex Sync with vent   Recent Labs Lab 04/26/16 1914 04/27/16 0456 04/28/16 0430  HGB 7.6* 8.0* 7.6*  HCT 24.4* 26.3* 25.1*  WBC 10.9* 10.7* 7.6  PLT 227 278 263    Recent Labs Lab 04/24/16 0330 04/24/16 0350 04/25/16 0605 04/26/16 0500 04/26/16 1410 04/27/16 0456 04/27/16 0457 04/28/16 0430  NA 143  --  140  139 141 143  --  145 148*  K 4.1  --  4.5  4.4 3.5 3.7  --  3.4* 4.0  CL 107  --  107  106 107 108  --  111 117*  CO2 28  --  24  25 25 24   --  24 23  GLUCOSE 171*  --  230*  234* 218* 124*  --  101* 147*  BUN 58*  --  58*  57* 73* 73*  --  65* 47*  CREATININE 0.87  --  1.02  1.06 1.32* 1.25*  --  1.03 1.02  CALCIUM 8.0*  --  7.9*  7.9* 7.9* 7.9*  --  8.2* 8.3*  MG  --  2.2 2.1 2.0  --  2.0  --  2.0  PHOS 3.8  --  3.3 3.0  --   --  3.4 3.0   Estimated Creatinine Clearance: 92.6 mL/min (by C-G formula based on SCr of 1.02 mg/dL).   A:  Acute resp failure - following RLL MRSA HCAP (bedsores as well) Failure to wean due to hypoactive delirium, deconditioning and also 19L volume overload contributing Acute encephalopathy Chronic A Fib on IV heparin gtt (as of feb 2017 was on eliquis and in interim has been on coumadin) Mild hypernatremia   P: lasix start + free water Full vent support Precedex titrate for calm Resident to call Dr Jacinto HalimGanji to see rationale for coumadin v eliquis    .  Rest per NP/medical resident whose  note is outlined above and that I agree with  The patient is critically ill with multiple organ systems failure and requires high complexity decision making for assessment and support, frequent evaluation and titration of therapies, application of advanced monitoring technologies and extensive interpretation of multiple databases.   Critical Care Time devoted to patient care services described in this note is  30  Minutes. This time reflects time of care of this signee Dr Kalman ShanMurali Allyiah Gartner. This critical care time does not reflect procedure time, or teaching time or supervisory time of PA/NP/Med student/Med Resident etc but could involve care discussion time    Dr. Kalman ShanMurali Nazar Kuan, M.D., [facility]P Pulmonary and Critical Care Medicine Staff Physician [facility] [place] [facility] Pager: [phone], If no answer or between  15:00h - 7:00h: call [phone]  04/28/2016 9:04 AM

## 2016-04-28 NOTE — Progress Notes (Signed)
ANTICOAGULATION CONSULT NOTE - Follow Up Consult  Pharmacy Consult for Heparin Indication: atrial fibrillation  Allergies  Allergen Reactions  . Niaspan [Niacin Er] Hives and Other (See Comments)    Reaction:  Facial redness/hotness     Patient Measurements: Height: '5\' 10"'$  (177.8 cm) Weight: 239 lb 3.2 oz (108.5 kg) IBW/kg (Calculated) : 73  Heparin Wt: 97 kg  Vital Signs: Temp: 100.2 F (37.9 C) (10/19 1200) Temp Source: Core (Comment) (10/19 0356) BP: 113/64 (10/19 1234) Pulse Rate: 65 (10/19 1234)  Labs:  Recent Labs  04/25/16 2300  04/26/16 1015 04/26/16 1410 04/26/16 1914 04/27/16 0456 04/27/16 0457 04/28/16 0430 04/28/16 1200  HGB 8.0*  < >  --   --  7.6* 8.0*  --  7.6*  --   HCT 26.0*  < >  --   --  24.4* 26.3*  --  25.1*  --   PLT  --   < >  --   --  227 278  --  263  --   HEPARINUNFRC 0.10*  --  0.12*  --   --   --   --   --  0.25*  CREATININE  --   < >  --  1.25*  --   --  1.03 1.02  --   < > = values in this interval not displayed.  Estimated Creatinine Clearance: 92.6 mL/min (by C-G formula based on SCr of 1.02 mg/dL).   Medications:  Scheduled:  . aspirin  81 mg Oral Daily  . chlorhexidine gluconate (MEDLINE KIT)  15 mL Mouth Rinse BID  . collagenase   Topical Daily  . diltiazem  15 mg Per Tube Q6H  . free water  200 mL Per Tube Q4H  . furosemide  40 mg Intravenous TID  . insulin aspart  0-15 Units Subcutaneous Q4H  . mouth rinse  15 mL Mouth Rinse QID  . pantoprazole sodium  40 mg Per Tube Q1200    Assessment: 55 yoM with atrial flutter on warfarin PTA now on heparin per pharmacy dosing. Heparin initially held due to abdominal tenderness and decreased Hgb. Heparin restarted 10/19 at 0400 with no bolus after abdominal US negative for a bleed. Anti-Xa level subtherapeutic this afternoon at 0.25. Hgb currently 7.6, plt 263, no S/Sx bleeding noted.  Goal of Therapy: Heparin level 0.3 - 0.7 units/mL Monitor platelets by anticoagulation  protocol: Yes  Plan:  - Increase heparin to 2200 units/hr - Check anti-Xa in 6 hours. - Follow daily anti-Xa level, CBC, S/Sx bleeding  Arrie Senate, PharmD PGY-1 Pharmacy Resident Pager: [phone] 04/28/2016

## 2016-04-28 NOTE — Progress Notes (Signed)
 of fentanyl wasted with Amy keaton-RN down the sink

## 2016-04-28 NOTE — Progress Notes (Signed)
eLink Physician-Brief Progress Note Patient Name: Clayton [name] DOB: 06/27/1956 MRN: [id number]   Date of Service  04/28/2016  HPI/Events of Note  Agitation - Request to renew restraints and Precedex IV infusion.   eICU Interventions  Will renew Precedex IV infusion and restraint orders.      Intervention Category Minor Interventions: Agitation / anxiety - evaluation and management  Lakeeta Dobosz Eugene 04/28/2016, 6:40 PM

## 2016-04-28 NOTE — Progress Notes (Signed)
Rt attempted wean trial on patient. Patient failed due to increased RR and low Vt. Patient switched back to full support.

## 2016-04-28 NOTE — Progress Notes (Signed)
[place] for Heparin Indication: atrial fibrillation  Allergies  Allergen Reactions  . Niaspan [Niacin Er] Hives and Other (See Comments)    Reaction:  Facial redness/hotness     Patient Measurements: Height: '5\' 10"'$  (177.8 cm) Weight: 239 lb 3.2 oz (108.5 kg) IBW/kg (Calculated) : 73  Heparin Wt: 97 kg  Vital Signs: Temp: 100.6 F (38.1 C) (10/19 1906) BP: 134/71 (10/19 1906) Pulse Rate: 71 (10/19 1906)  Labs:  Recent Labs  04/26/16 1015 04/26/16 1410 04/26/16 1914 04/27/16 0456 04/27/16 0457 04/28/16 0430 04/28/16 1200 04/28/16 1915  HGB  --   --  7.6* 8.0*  --  7.6*  --   --   HCT  --   --  24.4* 26.3*  --  25.1*  --   --   PLT  --   --  227 278  --  263  --   --   HEPARINUNFRC 0.12*  --   --   --   --   --  0.25* 0.26*  CREATININE  --  1.25*  --   --  1.03 1.02  --   --     Estimated Creatinine Clearance: 92.6 mL/min (by C-G formula based on SCr of 1.02 mg/dL).   Medications:  Scheduled:  . aspirin  81 mg Oral Daily  . chlorhexidine gluconate (MEDLINE KIT)  15 mL Mouth Rinse BID  . collagenase   Topical Daily  . diltiazem  15 mg Per Tube Q6H  . free water  200 mL Per Tube Q4H  . furosemide  40 mg Intravenous TID  . insulin aspart  0-15 Units Subcutaneous Q4H  . mouth rinse  15 mL Mouth Rinse QID  . pantoprazole sodium  40 mg Per Tube Q1200    Assessment: 1 yoM with atrial flutter on warfarin PTA now on heparin per pharmacy dosing. Heparin initially held due to abdominal tenderness and decreased Hgb. Heparin restarted 10/19 at 0400 with no bolus after abdominal US negative for a bleed.   Heparin level remains subtherapeutic despite rate increase earlier today. hgb 7.6, plts wnl.    Goal of Therapy: Heparin level 0.3 - 0.7 units/mL Monitor platelets by anticoagulation protocol: Yes  Plan:  -Increase heparin to 2400 units/hr -Daily HL, CBC -Next level with AM labs -Monitor s/sx bleeding    Hughes Better, PharmD, BCPS Clinical Pharmacist 04/28/2016 7:52 PM

## 2016-04-29 ENCOUNTER — Inpatient Hospital Stay (HOSPITAL_COMMUNITY): Payer: Medicare HMO

## 2016-04-29 LAB — PROCALCITONIN: Procalcitonin: 0.71 ng/mL

## 2016-04-29 LAB — CBC
HEMATOCRIT: 25.3 % — AB (ref 39.0–52.0)
HEMOGLOBIN: 7.6 g/dL — AB (ref 13.0–17.0)
MCH: 27 pg (ref 26.0–34.0)
MCHC: 30 g/dL (ref 30.0–36.0)
MCV: 89.7 fL (ref 78.0–100.0)
Platelets: 259 10*3/uL (ref 150–400)
RBC: 2.82 MIL/uL — AB (ref 4.22–5.81)
RDW: 18.5 % — ABNORMAL HIGH (ref 11.5–15.5)
WBC: 9.5 10*3/uL (ref 4.0–10.5)

## 2016-04-29 LAB — RENAL FUNCTION PANEL
ALBUMIN: 1.8 g/dL — AB (ref 3.5–5.0)
ANION GAP: 7 (ref 5–15)
BUN: 44 mg/dL — ABNORMAL HIGH (ref 6–20)
CHLORIDE: 111 mmol/L (ref 101–111)
CO2: 25 mmol/L (ref 22–32)
Calcium: 7.9 mg/dL — ABNORMAL LOW (ref 8.9–10.3)
Creatinine, Ser: 1.01 mg/dL (ref 0.61–1.24)
GFR calc Af Amer: >60 mL/min (ref >60)
GFR calc non Af Amer: >60 mL/min (ref >60)
GLUCOSE: 261 mg/dL — AB (ref 65–99)
PHOSPHORUS: 3 mg/dL (ref 2.5–4.6)
POTASSIUM: 2.7 mmol/L — AB (ref 3.5–5.1)
Sodium: 143 mmol/L (ref 135–145)

## 2016-04-29 LAB — MAGNESIUM: Magnesium: 1.6 mg/dL — ABNORMAL LOW (ref 1.7–2.4)

## 2016-04-29 LAB — LACTIC ACID, PLASMA: Lactic Acid, Venous: 1.8 mmol/L (ref 0.5–1.9)

## 2016-04-29 LAB — HEPATIC FUNCTION PANEL
ALBUMIN: 1.8 g/dL — AB (ref 3.5–5.0)
ALK PHOS: 142 U/L — AB (ref 38–126)
ALT: 54 U/L (ref 17–63)
AST: 52 U/L — AB (ref 15–41)
BILIRUBIN TOTAL: 0.9 mg/dL (ref 0.3–1.2)
Bilirubin, Direct: 0.3 mg/dL (ref 0.1–0.5)
Indirect Bilirubin: 0.6 mg/dL (ref 0.3–0.9)
Total Protein: 6.5 g/dL (ref 6.5–8.1)

## 2016-04-29 LAB — GLUCOSE, CAPILLARY
GLUCOSE-CAPILLARY: 178 mg/dL — AB (ref 65–99)
GLUCOSE-CAPILLARY: 192 mg/dL — AB (ref 65–99)
GLUCOSE-CAPILLARY: 236 mg/dL — AB (ref 65–99)
GLUCOSE-CAPILLARY: 271 mg/dL — AB (ref 65–99)
Glucose-Capillary: 204 mg/dL — ABNORMAL HIGH (ref 65–99)
Glucose-Capillary: 242 mg/dL — ABNORMAL HIGH (ref 65–99)
Glucose-Capillary: 264 mg/dL — ABNORMAL HIGH (ref 65–99)

## 2016-04-29 LAB — CK: Total CK: 136 U/L (ref 49–397)

## 2016-04-29 LAB — BASIC METABOLIC PANEL
ANION GAP: 6 (ref 5–15)
BUN: 40 mg/dL — ABNORMAL HIGH (ref 6–20)
CHLORIDE: 116 mmol/L — AB (ref 101–111)
CO2: 25 mmol/L (ref 22–32)
Calcium: 8 mg/dL — ABNORMAL LOW (ref 8.9–10.3)
Creatinine, Ser: 1.05 mg/dL (ref 0.61–1.24)
GFR calc Af Amer: >60 mL/min (ref >60)
GLUCOSE: 171 mg/dL — AB (ref 65–99)
POTASSIUM: 3.9 mmol/L (ref 3.5–5.1)
Sodium: 147 mmol/L — ABNORMAL HIGH (ref 135–145)

## 2016-04-29 LAB — HEPARIN LEVEL (UNFRACTIONATED)
HEPARIN UNFRACTIONATED: 0.29 [IU]/mL — AB (ref 0.30–0.70)
HEPARIN UNFRACTIONATED: 0.42 [IU]/mL (ref 0.30–0.70)

## 2016-04-29 MED ORDER — POTASSIUM CHLORIDE 20 MEQ/15ML (10%) PO SOLN
40.0000 meq | ORAL | Status: AC
  Administered 2016-04-29 (×2): 40 meq via ORAL
  Filled 2016-04-29: qty 30

## 2016-04-29 MED ORDER — POTASSIUM CHLORIDE 20 MEQ/15ML (10%) PO SOLN
ORAL | Status: AC
  Administered 2016-04-29: 40 meq
  Filled 2016-04-29: qty 30

## 2016-04-29 MED ORDER — SODIUM CHLORIDE 0.9 % IV SOLN
INTRAVENOUS | Status: DC
  Administered 2016-04-29 – 2016-05-04 (×2): via INTRAVENOUS

## 2016-04-29 MED ORDER — INSULIN ASPART 100 UNIT/ML ~~LOC~~ SOLN
0.0000 [IU] | SUBCUTANEOUS | Status: DC
  Administered 2016-04-29 (×2): 4 [IU] via SUBCUTANEOUS
  Administered 2016-04-29 (×2): 11 [IU] via SUBCUTANEOUS
  Administered 2016-04-30 – 2016-05-01 (×7): 7 [IU] via SUBCUTANEOUS
  Administered 2016-05-01 (×2): 4 [IU] via SUBCUTANEOUS

## 2016-04-29 MED ORDER — FUROSEMIDE 10 MG/ML IJ SOLN
80.0000 mg | Freq: Three times a day (TID) | INTRAMUSCULAR | Status: DC
  Administered 2016-04-29 – 2016-04-30 (×6): 80 mg via INTRAVENOUS
  Filled 2016-04-29 (×8): qty 8

## 2016-04-29 MED ORDER — POTASSIUM CHLORIDE CRYS ER 20 MEQ PO TBCR
40.0000 meq | EXTENDED_RELEASE_TABLET | ORAL | Status: DC
  Filled 2016-04-29: qty 2

## 2016-04-29 MED ORDER — POTASSIUM CHLORIDE 20 MEQ/15ML (10%) PO SOLN
40.0000 meq | Freq: Two times a day (BID) | ORAL | Status: DC
  Administered 2016-04-29 – 2016-05-01 (×5): 40 meq via ORAL
  Filled 2016-04-29 (×7): qty 30

## 2016-04-29 MED ORDER — POTASSIUM CHLORIDE 10 MEQ/50ML IV SOLN
10.0000 meq | INTRAVENOUS | Status: AC
  Administered 2016-04-29 (×4): 10 meq via INTRAVENOUS
  Filled 2016-04-29 (×4): qty 50

## 2016-04-29 MED ORDER — MAGNESIUM SULFATE 2 GM/50ML IV SOLN
2.0000 g | Freq: Once | INTRAVENOUS | Status: AC
  Administered 2016-04-29: 2 g via INTRAVENOUS
  Filled 2016-04-29: qty 50

## 2016-04-29 MED ORDER — POTASSIUM CHLORIDE 10 MEQ/100ML IV SOLN
10.0000 meq | INTRAVENOUS | Status: DC
  Filled 2016-04-29: qty 100

## 2016-04-29 MED ORDER — INSULIN DETEMIR 100 UNIT/ML ~~LOC~~ SOLN
5.0000 [IU] | Freq: Every day | SUBCUTANEOUS | Status: DC
  Administered 2016-04-29 – 2016-05-01 (×3): 5 [IU] via SUBCUTANEOUS
  Filled 2016-04-29 (×3): qty 0.05

## 2016-04-29 MED ORDER — POTASSIUM CHLORIDE 10 MEQ/50ML IV SOLN
INTRAVENOUS | Status: AC
  Administered 2016-04-29: 10 meq
  Filled 2016-04-29: qty 50

## 2016-04-29 MED ORDER — INSULIN ASPART 100 UNIT/ML ~~LOC~~ SOLN
4.0000 [IU] | SUBCUTANEOUS | Status: DC
  Administered 2016-04-29 – 2016-05-01 (×13): 4 [IU] via SUBCUTANEOUS

## 2016-04-29 NOTE — Care Management Note (Signed)
Case Management Note  Patient Details  Name: Clayton [name] MRN: [id number] Date of Birth: 09/18/1955  Subjective/Objective:    Pt admitted with sepsis                Action/Plan:  Per H&P pt is from SNF or Assisted Living.  Pt is intubated without family at bedside.  CM unable to reach next of kin via phone.  CSW consulted 04/17/2016 by attending.  CM will verbally communicate with CSW.  CM will continue to follow for discharge needs   Expected Discharge Date:  05/10/16               Expected Discharge Plan:  Skilled Nursing Facility  In-House Referral:  Clinical Social Work  Discharge planning Services  CM Consult  Post Acute Care Choice:    Choice offered to:     DME Arranged:    DME Agency:     HH Arranged:    HH Agency:     Status of Service:  In process, will continue to follow  If discussed at Long Length of Stay Meetings, dates discussed:    Additional Comments: 04/29/2016 Pt remains intubated  04/27/16 CM consulted with CSW and was informed [facility].  Pt remains intubated and will likely be SNF consult - pt previously recommended for SNF but declined on prev admission. Clayton [name], Clayton Heindl S, RN 04/29/2016, 4:28 PM

## 2016-04-29 NOTE — Progress Notes (Signed)
[place] for Heparin Indication: atrial fibrillation  Allergies  Allergen Reactions  . Niaspan [Niacin Er] Hives and Other (See Comments)    Reaction:  Facial redness/hotness     Patient Measurements: Height: '5\' 10"'$  (177.8 cm) Weight: 239 lb 3.2 oz (108.5 kg) IBW/kg (Calculated) : 73  Heparin Wt: 97 kg  Vital Signs: Temp: 100.9 F (38.3 C) (10/20 0600) BP: 138/70 (10/20 0600) Pulse Rate: 64 (10/20 0600)  Labs:  Recent Labs  04/26/16 1410  04/27/16 0456 04/27/16 0457 04/28/16 0430 04/28/16 1200 04/28/16 1915 04/29/16 0500  HGB  --   < > 8.0*  --  7.6*  --   --  7.6*  HCT  --   < > 26.3*  --  25.1*  --   --  25.3*  PLT  --   < > 278  --  263  --   --  259  HEPARINUNFRC  --   --   --   --   --  0.25* 0.26* 0.29*  CREATININE 1.25*  --   --  1.03 1.02  --   --   --   < > = values in this interval not displayed.  Estimated Creatinine Clearance: 92.6 mL/min (by C-G formula based on SCr of 1.02 mg/dL).   Medications:  Scheduled:  . aspirin  81 mg Oral Daily  . chlorhexidine gluconate (MEDLINE KIT)  15 mL Mouth Rinse BID  . collagenase   Topical Daily  . diltiazem  15 mg Per Tube Q6H  . free water  200 mL Per Tube Q4H  . furosemide  40 mg Intravenous TID  . insulin aspart  0-20 Units Subcutaneous Q4H  . insulin aspart  4 Units Subcutaneous Q4H  . mouth rinse  15 mL Mouth Rinse QID  . pantoprazole sodium  40 mg Per Tube Q1200    Assessment: 81 yoM with atrial flutter on warfarin PTA now on heparin per pharmacy dosing. abdominal US negative for a bleed. Heparin level slightly subtherapeutic (0.29) on gtt at 2400 units/hr. No issues with line or bleeding reported per RN.  Goal of Therapy: Heparin level 0.3 - 0.7 units/mL Monitor platelets by anticoagulation protocol: Yes  Plan:  -Increase heparin to 2600 units/hr -F/u 6hr heparin level  Sherlon Handing, PharmD, BCPS Clinical pharmacist, pager [phone] 04/29/2016 6:55 AM

## 2016-04-29 NOTE — Progress Notes (Signed)
[place] for Heparin Indication: atrial fibrillation  Allergies  Allergen Reactions  . Niaspan [Niacin Er] Hives and Other (See Comments)    Reaction:  Facial redness/hotness     Patient Measurements: Height: _0  (177.8 cm) Weight: 239 lb 3.2 oz (108.5 kg) IBW/kg (Calculated) : 73  Heparin Wt: 97 kg  Vital Signs: Temp: 99.1 F (37.3 C) (10/20 1300) Temp Source: Core (Comment) (10/20 1200) BP: 125/67 (10/20 1300) Pulse Rate: 80 (10/20 1300)  Labs:  Recent Labs  04/27/16 0456 04/27/16 0457 04/28/16 0430  04/28/16 1915 04/29/16 0500 04/29/16 1120 04/29/16 1330  HGB 8.0*  --  7.6*  --   --  7.6*  --   --   HCT 26.3*  --  25.1*  --   --  25.3*  --   --   PLT 278  --  263  --   --  259  --   --   HEPARINUNFRC  --   --   --   < > 0.26* 0.29*  --  0.42  CREATININE  --  1.03 1.02  --   --  1.01  --   --   CKTOTAL  --   --   --   --   --   --  136  --   < > = values in this interval not displayed.  Estimated Creatinine Clearance: 93.5 mL/min (by C-G formula based on SCr of 1.01 mg/dL).   Medications:  Scheduled:  . aspirin  81 mg Oral Daily  . chlorhexidine gluconate (MEDLINE KIT)  15 mL Mouth Rinse BID  . collagenase   Topical Daily  . diltiazem  15 mg Per Tube Q6H  . furosemide  80 mg Intravenous TID  . insulin aspart  0-20 Units Subcutaneous Q4H  . insulin aspart  4 Units Subcutaneous Q4H  . insulin detemir  5 Units Subcutaneous Daily  . mouth rinse  15 mL Mouth Rinse QID  . pantoprazole sodium  40 mg Per Tube Q1200  . potassium chloride  10 mEq Intravenous Q1 Hr x 5  . potassium chloride  40 mEq Oral BID    Assessment: 51 yoM with atrial flutter on warfarin PTA now on heparin per pharmacy dosing. abdominal US negative for a bleed. Heparin level slightly subtherapeutic (0.29) on gtt at 2400 units/hr. No issues with line or bleeding reported per RN.  Repeat HL is therapeutic at 0.42 on heparin 2600 units/hr. No issues  with infusion or bleeding.  Goal of Therapy: Heparin level 0.3 - 0.7 units/mL Monitor platelets by anticoagulation protocol: Yes  Plan:  Continue heparin 2600 units/hr Daily HL/CBC Monitor s/sx of bleeding  Andrey Cota. Diona Foley, PharmD, [place] Clinical Pharmacist Pager [phone] 04/29/2016 2:51 PM

## 2016-04-29 NOTE — Progress Notes (Signed)
Results for [name], Nikitas K (MRN [id number]) as of 04/29/2016 10:28  Ref. Range 04/28/2016 16:01 04/28/2016 19:34 04/29/2016 00:06 04/29/2016 04:07 04/29/2016 08:11  Glucose-Capillary Latest Ref Range: 65 - 99 mg/dL 409224 (H) 811225 (H) 914236 (H) 242 (H) 264 (H)   Noted that patient's blood sugars are greater than 180 mg/dl.  Patient was on Levemir 25 U BID on 10/16 when on continuous tube feedings. Recommend increasing Levemir to 15 -20 units daily and continuing Novolog correction scale and tube feed coverage. May need to titrate dosage as needed. Smith MinceKendra Jalan Bodi RN BSN CDE

## 2016-04-29 NOTE — Progress Notes (Signed)
PULMONARY / CRITICAL CARE MEDICINE   Name: WILFRED DAYRIT MRN: [id number] DOB: July 15, 1955    ADMISSION DATE:  04/13/2016  REFERRING MD:  Rubin Payor (EDP)   CHIEF COMPLAINT:  Sepsis, respiratory failure   HISTORY OF PRESENT ILLNESS:   60yo male SNF resident with hx CHF, CAD s/p remote stent, DM, HTN, Aflutter s/p ablation on coumadin, severe PVD s/p R BKA, being treated for unknown infection at SNF with rocephin.  Presented 10/12 from SNF after being found unresponsive with agonal breaths.  Bagged by EMS en route and intubated on arrival to ER.  In ER was febrile to 104, hypotensive with SBP 70's.  Labs notable for AKI with K 7.1, glucose 500, WBC 36, lactate 3.6.  PCCM consulted for admit.   SUBJECTIVE: No events overnight. Patient had continued low grade fevers up to 100.21F and 101.1 this AM. Vitals otherwise stable overnight.  Nursing mentioned of some elevated CBGs; patient is on tube feeds. We are diuresing patient and he had 4.8 L out overnight; still net positive of 15L from 17L. Potassium 2.7 this AM; repletion ordered.   REVIEW OF SYSTEMS:  Answers questions with nodding/shanking of head intermittently. VITAL SIGNS: BP 138/70   Pulse 64   Temp (!) 100.9 F (38.3 C)   Resp (!) 22   Ht 5\' 10"  (1.778 m)   Wt 108.5 kg (239 lb 3.2 oz)   SpO2 100%   BMI 34.32 kg/m   HEMODYNAMICS:    VENTILATOR SETTINGS: Vent Mode: PRVC FiO2 (%):  [40 %] 40 % Set Rate:  [22 bmp] 22 bmp Vt Set:  [580 mL] 580 mL PEEP:  [5 cmH20] 5 cmH20 Pressure Support:  [18 cmH20] 18 cmH20 Plateau Pressure:  [19 cmH20-23 cmH20] 23 cmH20  INTAKE / OUTPUT: I/O last 3 completed shifts: In: 2042.4 [I.V.:1094.9; Other:125; NG/GT:722.5; IV Piggyback:100] Out: 3960 [Urine:3960]  PHYSICAL EXAMINATION:  General:  NAD  Neuro:  Answers questions  HEENT:  ETT in place. No scleral icterus or injection. Cardiovascular: regular rhythm. No appreciable JVD. Lungs: coarse breath sounds. Symmetric chest rise on  ventilator.  Abdomen:  Somewhat hypoactive bowel sounds, nontender to palpation ,  Flexi-Seal rectal tube in place for stool. Musculoskeletal:  Warm and dry. 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  LABS:  BMET  Recent Labs Lab 04/26/16 1410 04/27/16 0457 04/28/16 0430  NA 143 145 148*  K 3.7 3.4* 4.0  CL 108 111 117*  CO2 24 24 23   BUN 73* 65* 47*  CREATININE 1.25* 1.03 1.02  GLUCOSE 124* 101* 147*    Electrolytes  Recent Labs Lab 04/26/16 0500 04/26/16 1410 04/27/16 0456 04/27/16 0457 04/28/16 0430  CALCIUM 7.9* 7.9*  --  8.2* 8.3*  MG 2.0  --  2.0  --  2.0  PHOS 3.0  --   --  3.4 3.0    CBC  Recent Labs Lab 04/26/16 1914 04/27/16 0456 04/28/16 0430  WBC 10.9* 10.7* 7.6  HGB 7.6* 8.0* 7.6*  HCT 24.4* 26.3* 25.1*  PLT 227 278 263    Coag's No results for input(s): APTT, INR in the last 168 hours.  Sepsis Markers  Recent Labs Lab 04/22/16 1314 04/22/16 1827 04/23/16 0000 04/23/16 0343  LATICACIDVEN 2.7* 1.9 1.9  --   PROCALCITON  --   --   --  14.94    ABG  Recent Labs Lab 04/22/16 0803  PHART 7.424  PCO2ART 40.0  PO2ART 75.0*    Liver Enzymes  Recent Labs Lab 04/27/16  [id number] 04/27/16 0842 04/28/16 0430  AST  --  80*  --   ALT  --  58  --   ALKPHOS  --  170*  --   BILITOT  --  1.1  --   ALBUMIN 1.8* 1.7* 1.8*    Cardiac Enzymes No results for input(s): TROPONINI, PROBNP in the last 168 hours.  Glucose  Recent Labs Lab 04/28/16 0740 04/28/16 1121 04/28/16 1601 04/28/16 1934 04/29/16 0006 04/29/16 0407  GLUCAP 142* 175* 224* 225* 236* 242*    Imaging Dg Chest Port 1 View  Result Date: 04/28/2016 CLINICAL DATA:  Pneumonia, diabetes EXAM: PORTABLE CHEST 1 VIEW COMPARISON:  04/27/2016 FINDINGS: Cardiomediastinal silhouette is stable. Status post CABG. Stable endotracheal tube position. No convincing pulmonary edema. Hazy right infrahilar and right basilar atelectasis or infiltrate. IMPRESSION: No  pulmonary edema. Stable endotracheal tube position. Hazy right infrahilar and right base atelectasis or infiltrate. Electronically Signed   By: Natasha MeadLiviu  Pop M.D.   On: 04/28/2016 08:47     STUDIES:  Renal U/S 10/12:  No abnormality or hydronephrosis.  TTE 10/13:  Difficult study. Mild LVH w/ EF 55%. Possible akinesis apical myocardium without obvious thrombus.Moderate aortic stenosis w/ mild mitral stenosis. RV poorly visualized. CT abdomen 10/15: no evidence for retroperitoneal hemorrhage. RLL central airspace disease compatible with aspiration and/or PNA CXR: 10/17: unchanged from 10/16. No consolidation. Final read pending.  RUQ US 10/18: Cholelithiasis without sonographic evidence acute cholecystitis. A hepatobiliary scintigraphy may provide better evaluation of the gallbladder if an acute cholecystitis is clinically suspected. Fatty Liver.  CXR 10/19: No pulmonary edema. Stable endotracheal tube position. Hazy right infrahilar and right base atelectasis or infiltrate CXR 10/20: pending  MICROBIOLOGY: MRSA PCR 10/12:  Negative Blood Ctx x2 10/12 >> 1/2 Coag Neg Staph; 1/2 NG x 5 days  Urine Ctx 10/12:  Negative  Tracheal Asp Ctx 10/13 >> MRSA Respiratory Viral Panel PCR 10/13:  Negative   ANTIBIOTICS: Zosyn 10/12 x1 dose; 10/16 >>10/17 Vancomycin 10/12 >>10/18 Cefepime 10/12 >> 10/16  SIGNIFICANT EVENTS: 10/12 - Admit & intubation in ED 10/14 - switched from Levo to Neo due to intermittent tachycardia  10/15 - off pressor 10/17 - Failed wean 10/18 - failed wean started lasix  10/19 - Failed wean   LINES/TUBES: OETT 7.5 10/12 >>  RUE PICC (SNF) 10/11 >>   Foley 10/12 >> PEG >> PIV x1  ASSESSMENT / PLAN:  Mr. Yetta BarreJones is a  60 y.o. male with PMH CHF, CAD s/p remote stent, DM, HTN, Aflutter s/p ablation on anticoagulation, severe PVD s/p R BKA. Presenting with septic shock likely from MRSA PNA. Is on Heparin for history of a flutter.  Attempted SBT 10/18 and 10/19 but did not  pass; Possibly fluid overloaded therefore diuresing  Lasix.   INFECTIOUS A: Severe Sepsis, improving  RLL MRSA PNA: tracheal aspirate culture with MRSA   P:   S/p Vancomycin for 7 days  S/p Cefepime and Zosyn  PULMONARY A: Acute Hypoxic Respiratory Failure - RLL central airspace disease compatible with aspiration/PNA on Imaging (CT). Treated with abx.  P:   Full Vent Support  Daily PS Wean & SBT Intermittent CXR & ABG Albuterol nebs prn   CARDIOVASCULAR A: Shock - Likely septic. Resolved and off presors.  Elevated Troponin I - Likely demand ischemia. Echo was difficult to interpret. Questionable apical akinesis. Aortic Stenosis - Seen on poor quality TTE. SVT - Questionable Atrial flutter, HR within normal limits H/O CAD w/ Stent H/O CHF -  Dilated LV w/ decreased function November 2016. H/O A Flutter - S/P ablation   P:  Cardizem 15mg  q 6 hours  Heparin drip  Continuous telemetry monitoring Vitals per unit protocol Holding home lasix, metoprolol, lisinopril, & eliquis  Goal MAP >65 & SBP >90 ASA 81mg  VT daily Lasix IV 40mg  TID   RENAL A: Acute Renal Failure - Cr stable while on diuresis. Had renal US earlier in hospital stay which was unremarkable. Hypernatremia - resolved Hypophosphatemia - Resolved Hypokalemia - 2.7 this AM in the setting of aggressive diuresis.  Hypomagnesemia - 1.6 this AM Lactic Acidosis - Resolved. AGMA - Resolved.   P:   Monitoring UOP with Foley Trending Electrolytes and Renal function Discontinue free water Replacing electrolytes as indicated: Kcl IV x 5 and Kdur x 2 doses today.  IV Mag 2g  BMP this afternoon  Will likely need scheduled Kdur if continuing to diurese  GASTROINTESTINAL A: Loose stools: c diff negative  RUQ tenderness : RUQ [place] with cholelithiasis without sonographic evidence of cholecystitis. Tenderness resolved.   P:   NPO  Tube feeds  Protonix VT daily   HEMATOLOGIC A: Leukocytosis -   resolved; in the setting of pneumonia   Anemia - stable hgb. S/p 1 unit pRBC 10/16. Hgb stable this AM. No signs of active bleeding  Coagulopathy - Possibly secondary to Eliquis vs Coumadin.  P:  Trending cell counts daily w/ CBC Trending Hgb/Hct q12hr Transfuse for Hgb <7.0 Heparin Drip   ENDOCRINE A: H/O DM - A1c 10.6 in June 2017. CBGs in 200s in the setting of tube feeds   P:   SSI per Resistant Algorithm Start Scheduled Novolog 4 units q 4 hrs (with tube feeds) Start Levemir 5 units  Accu-Checks q4hr  NEUROLOGIC A: Acute Encephalopathy - Multifactorial from hypoxia and probable toxic metabolic. Sedation on Ventilator  P:   RASS goal: 0 to -1 Precedex gtt  Versed IV prn sedation   INTEGUMENT:  A: Sacral Pressure Injury  P: - wound care: santyl with saline moistened gauze daily covered with dry gauze    Palma Holter, MD PGY 2 Family Medicine  04/29/2016  6:26 AM

## 2016-04-30 ENCOUNTER — Inpatient Hospital Stay (HOSPITAL_COMMUNITY): Payer: Medicare HMO

## 2016-04-30 DIAGNOSIS — Z833 Family history of diabetes mellitus: Secondary | ICD-10-CM

## 2016-04-30 DIAGNOSIS — Z950 Presence of cardiac pacemaker: Secondary | ICD-10-CM

## 2016-04-30 DIAGNOSIS — L97519 Non-pressure chronic ulcer of other part of right foot with unspecified severity: Secondary | ICD-10-CM

## 2016-04-30 DIAGNOSIS — Z89512 Acquired absence of left leg below knee: Secondary | ICD-10-CM

## 2016-04-30 DIAGNOSIS — I4892 Unspecified atrial flutter: Secondary | ICD-10-CM

## 2016-04-30 DIAGNOSIS — I110 Hypertensive heart disease with heart failure: Secondary | ICD-10-CM

## 2016-04-30 DIAGNOSIS — E875 Hyperkalemia: Secondary | ICD-10-CM

## 2016-04-30 DIAGNOSIS — Z888 Allergy status to other drugs, medicaments and biological substances status: Secondary | ICD-10-CM

## 2016-04-30 DIAGNOSIS — R740 Nonspecific elevation of levels of transaminase and lactic acid dehydrogenase [LDH]: Secondary | ICD-10-CM

## 2016-04-30 DIAGNOSIS — I509 Heart failure, unspecified: Secondary | ICD-10-CM

## 2016-04-30 DIAGNOSIS — Z89431 Acquired absence of right foot: Secondary | ICD-10-CM

## 2016-04-30 DIAGNOSIS — R509 Fever, unspecified: Secondary | ICD-10-CM

## 2016-04-30 DIAGNOSIS — Z7901 Long term (current) use of anticoagulants: Secondary | ICD-10-CM

## 2016-04-30 DIAGNOSIS — I2510 Atherosclerotic heart disease of native coronary artery without angina pectoris: Secondary | ICD-10-CM

## 2016-04-30 DIAGNOSIS — Z9911 Dependence on respirator [ventilator] status: Secondary | ICD-10-CM

## 2016-04-30 DIAGNOSIS — Z8249 Family history of ischemic heart disease and other diseases of the circulatory system: Secondary | ICD-10-CM

## 2016-04-30 DIAGNOSIS — Z9289 Personal history of other medical treatment: Secondary | ICD-10-CM

## 2016-04-30 DIAGNOSIS — R1011 Right upper quadrant pain: Secondary | ICD-10-CM

## 2016-04-30 DIAGNOSIS — R7401 Elevation of levels of liver transaminase levels: Secondary | ICD-10-CM

## 2016-04-30 DIAGNOSIS — E1151 Type 2 diabetes mellitus with diabetic peripheral angiopathy without gangrene: Secondary | ICD-10-CM

## 2016-04-30 DIAGNOSIS — E11621 Type 2 diabetes mellitus with foot ulcer: Secondary | ICD-10-CM

## 2016-04-30 LAB — RENAL FUNCTION PANEL
ALBUMIN: 2 g/dL — AB (ref 3.5–5.0)
Anion gap: 8 (ref 5–15)
BUN: 38 mg/dL — AB (ref 6–20)
CO2: 26 mmol/L (ref 22–32)
Calcium: 8.1 mg/dL — ABNORMAL LOW (ref 8.9–10.3)
Chloride: 112 mmol/L — ABNORMAL HIGH (ref 101–111)
Creatinine, Ser: 1.04 mg/dL (ref 0.61–1.24)
GFR calc Af Amer: >60 mL/min (ref >60)
GFR calc non Af Amer: >60 mL/min (ref >60)
GLUCOSE: 234 mg/dL — AB (ref 65–99)
PHOSPHORUS: 3.4 mg/dL (ref 2.5–4.6)
POTASSIUM: 3.1 mmol/L — AB (ref 3.5–5.1)
Sodium: 146 mmol/L — ABNORMAL HIGH (ref 135–145)

## 2016-04-30 LAB — CBC
HEMATOCRIT: 26.5 % — AB (ref 39.0–52.0)
HEMOGLOBIN: 7.9 g/dL — AB (ref 13.0–17.0)
MCH: 26.7 pg (ref 26.0–34.0)
MCHC: 29.8 g/dL — AB (ref 30.0–36.0)
MCV: 89.5 fL (ref 78.0–100.0)
Platelets: 272 10*3/uL (ref 150–400)
RBC: 2.96 MIL/uL — AB (ref 4.22–5.81)
RDW: 18.4 % — ABNORMAL HIGH (ref 11.5–15.5)
WBC: 10.7 10*3/uL — ABNORMAL HIGH (ref 4.0–10.5)

## 2016-04-30 LAB — GLUCOSE, CAPILLARY
GLUCOSE-CAPILLARY: 224 mg/dL — AB (ref 65–99)
GLUCOSE-CAPILLARY: 224 mg/dL — AB (ref 65–99)
Glucose-Capillary: 185 mg/dL — ABNORMAL HIGH (ref 65–99)
Glucose-Capillary: 201 mg/dL — ABNORMAL HIGH (ref 65–99)
Glucose-Capillary: 203 mg/dL — ABNORMAL HIGH (ref 65–99)

## 2016-04-30 LAB — BASIC METABOLIC PANEL
Anion gap: 6 (ref 5–15)
BUN: 34 mg/dL — ABNORMAL HIGH (ref 6–20)
CHLORIDE: 114 mmol/L — AB (ref 101–111)
CO2: 27 mmol/L (ref 22–32)
Calcium: 8.2 mg/dL — ABNORMAL LOW (ref 8.9–10.3)
Creatinine, Ser: 0.98 mg/dL (ref 0.61–1.24)
GFR calc non Af Amer: >60 mL/min (ref >60)
Glucose, Bld: 242 mg/dL — ABNORMAL HIGH (ref 65–99)
POTASSIUM: 3.3 mmol/L — AB (ref 3.5–5.1)
SODIUM: 147 mmol/L — AB (ref 135–145)

## 2016-04-30 LAB — PHOSPHORUS: Phosphorus: 3.4 mg/dL (ref 2.5–4.6)

## 2016-04-30 LAB — HEPARIN LEVEL (UNFRACTIONATED): HEPARIN UNFRACTIONATED: 0.44 [IU]/mL (ref 0.30–0.70)

## 2016-04-30 LAB — MAGNESIUM: Magnesium: 1.8 mg/dL (ref 1.7–2.4)

## 2016-04-30 MED ORDER — POTASSIUM CHLORIDE 20 MEQ/15ML (10%) PO SOLN
40.0000 meq | Freq: Once | ORAL | Status: AC
  Administered 2016-04-30: 40 meq
  Filled 2016-04-30: qty 30

## 2016-04-30 MED ORDER — MAGNESIUM SULFATE 2 GM/50ML IV SOLN
2.0000 g | Freq: Once | INTRAVENOUS | Status: AC
  Administered 2016-04-30: 2 g via INTRAVENOUS
  Filled 2016-04-30: qty 50

## 2016-04-30 NOTE — Consult Note (Signed)
Date of Admission:  04/19/2016  Date of Consult:  04/30/2016  Reason for Consult: FUO Referring Physician: Dr. Brantley Persons   HPI: Clayton [name] is an 60 y.o. male SNF resident with hx CHF, CAD s/p remote stent, DM, HTN, Aflutter s/p ablation WITH PACEMAKER  on coumadin, severe PVD s/p R BKA, being treated for? infection at SNF with rocephin.  Presented 10/12 from SNF after being found unresponsive with agonal breaths.  Bagged by EMS en route and intubated on arrival to ER.  In ER was febrile to 104, hypotensive with SBP 70's.  Labs notable for AKI with K 7.1, glucose 500, WBC 36, lactate 3.6. He was admitted blood cultures were obtained and he was treated with broad-spectrum antibiotics. CT abdomen was performed on the 15th due to concerns about retroperitoneal hemorrhage and this did not show rich peroneal hemorrhage but did show a right lower lobe consolidation can assistant with pneumonia. He was treated with vancomycin and cefepime the 16th. Then cultures that were taken from his respiratory tract yielded MRSA and he was narrowed to vancomycin alone and completed therapy on October 18. He remains on the ventilator and has had some further temperatures in the 100 to 101 range.  His CXR is not especially overwhelming at this point vs admission imaging. He has a wound in his non amputated leg with some bloody material on bandage. Not have discernible abdominal pain. It is not easy to interview the patient currently with him still on the ventilator and restrained.   Past Medical History:  Diagnosis Date  . CHF (congestive heart failure) ([place])   . Coronary artery disease   . CVA (cerebral infarction) 2007  . Diabetes ([place])   . Diabetes mellitus without complication ([place])   . Diabetic neuropathy ([place])   . Diabetic retinopathy ([place])   . GERD (gastroesophageal reflux disease)   . Hyperlipidemia   . Hypertension   . MI (myocardial infarction)    x2  . PNA (pneumonia)   . Stroke [facility])      Past Surgical History:  Procedure Laterality Date  . APPENDECTOMY    . BELOW KNEE LEG AMPUTATION Left   . ELECTROPHYSIOLOGIC STUDY N/A 08/03/2015   Procedure: A-Flutter Ablation;  Surgeon: Evans Lance, MD;  Location: [facility];  Service: Cardiovascular;  Laterality: N/A;  . PERCUTANEOUS PLACEMENT INTRAVASCULAR STENT CERVICAL CAROTID ARTERY Right     Social History:  reports that he has quit smoking. He has never used smokeless tobacco. He reports that he does not drink alcohol or use drugs.   Family History  Problem Relation Age of Onset  . Heart disease Father   . Diabetes Father   . Diabetes Sister     Allergies  Allergen Reactions  . Niaspan [Niacin Er] Hives and Other (See Comments)    Reaction:  Facial redness/hotness      Medications: I have reviewed patients current medications as documented in Epic Anti-infectives    Start     Dose/Rate Route Frequency Ordered Stop   04/26/16 1600  vancomycin (VANCOCIN) 500 mg in sodium chloride 0.9 % 100 mL IVPB     500 mg 100 mL/hr over 60 Minutes Intravenous Every 12 hours 04/26/16 1112 04/27/16 1626   04/26/16 1100  doxycycline (VIBRAMYCIN) 100 mg in dextrose 5 % 250 mL IVPB  Status:  Discontinued     100 mg 125 mL/hr over 120 Minutes Intravenous Every 12 hours 04/26/16 1053 04/26/16 1100  04/25/16 1600  vancomycin (VANCOCIN) 500 mg in sodium chloride 0.9 % 100 mL IVPB  Status:  Discontinued     500 mg 100 mL/hr over 60 Minutes Intravenous Every 12 hours 04/25/16 1558 04/26/16 1053   04/25/16 1230  piperacillin-tazobactam (ZOSYN) IVPB 3.375 g  Status:  Discontinued     3.375 g 12.5 mL/hr over 240 Minutes Intravenous Every 8 hours 04/25/16 1222 04/26/16 1053   04/23/16 0000  ceFEPIme (MAXIPIME) 1 g in dextrose 5 % 50 mL IVPB  Status:  Discontinued     1 g 100 mL/hr over 30 Minutes Intravenous Every 12 hours 04/22/16 1439 04/25/16 1117   04/22/16 1500  vancomycin (VANCOCIN) IVPB 750 mg/150 ml premix  Status:   Discontinued     750 mg 150 mL/hr over 60 Minutes Intravenous Every 12 hours 04/22/16 1439 04/25/16 1550   05/07/2016 1400  piperacillin-tazobactam (ZOSYN) IVPB 2.25 g  Status:  Discontinued     2.25 g 100 mL/hr over 30 Minutes Intravenous Every 6 hours 04/20/2016 0831 04/18/2016 0914   04/12/2016 1200  ceFEPIme (MAXIPIME) 1 g in dextrose 5 % 50 mL IVPB  Status:  Discontinued     1 g 100 mL/hr over 30 Minutes Intravenous Every 24 hours 04/24/2016 0914 04/22/16 1439   04/26/2016 0730  piperacillin-tazobactam (ZOSYN) IVPB 3.375 g     3.375 g 100 mL/hr over 30 Minutes Intravenous  Once 05/04/2016 0723 04/19/2016 0815   04/14/2016 0730  vancomycin (VANCOCIN) IVPB 1000 mg/200 mL premix  Status:  Discontinued     1,000 mg 200 mL/hr over 60 Minutes Intravenous  Once 04/28/2016 0723 05/07/2016 0726   04/16/2016 0730  vancomycin (VANCOCIN) 2,000 mg in sodium chloride 0.9 % 500 mL IVPB     2,000 mg 250 mL/hr over 120 Minutes Intravenous  Once 04/16/2016 0726 04/16/2016 1033         ROS: as in HPI otherwise remainder of 12 point Review of Systems is not obtainable due to patient being on ventilator   Blood pressure (!) 97/17, pulse 72, temperature (!) 101.3 F (38.5 C), resp. rate 17, height '5\' 10"'$  (1.778 m), weight 239 lb 3.2 oz (108.5 kg), SpO2 100 %.   General: Alert and awake, on the ventilator restrained  HEENT: anicteric sclera,  EOMI, oropharynx clear and without exudate  Cardiovascular: regular rate, normal r,  no murmur rubs or gallops Pulmonary: fairly clear to auscultation bilaterally, Gastrointestinal: soft nontender, nondistended, normal bowel sounds, Musculoskeletal/skin:  Left BKA is clean  Right foot amputation site with wound see pic 04/30/16:       Neuro: nonfocal, strength and sensation intact   Results for orders placed or performed during the hospital encounter of 05/05/2016 (from the past 48 hour(s))  Heparin level (unfractionated)     Status: Abnormal   Collection Time: 04/28/16   7:15 PM  Result Value Ref Range   Heparin Unfractionated 0.26 (L) 0.30 - 0.70 IU/mL    Comment:        IF HEPARIN RESULTS ARE BELOW EXPECTED VALUES, AND PATIENT DOSAGE HAS BEEN CONFIRMED, SUGGEST FOLLOW UP TESTING OF ANTITHROMBIN III LEVELS.   Glucose, capillary     Status: Abnormal   Collection Time: 04/28/16  7:34 PM  Result Value Ref Range   Glucose-Capillary 225 (H) 65 - 99 mg/dL   Comment 1 Notify RN    Comment 2 Document in Chart   Glucose, capillary     Status: Abnormal   Collection Time: 04/29/16 12:06 AM  Result  Value Ref Range   Glucose-Capillary 236 (H) 65 - 99 mg/dL   Comment 1 Notify RN    Comment 2 Document in Chart   Glucose, capillary     Status: Abnormal   Collection Time: 04/29/16  4:07 AM  Result Value Ref Range   Glucose-Capillary 242 (H) 65 - 99 mg/dL   Comment 1 Notify RN    Comment 2 Document in Chart   Renal function panel     Status: Abnormal   Collection Time: 04/29/16  5:00 AM  Result Value Ref Range   Sodium 143 135 - 145 mmol/L   Potassium 2.7 (LL) 3.5 - 5.1 mmol/L    Comment: CRITICAL RESULT CALLED TO, READ BACK BY AND VERIFIED WITH: JEN MOTLEY,RN AT 0711 04/29/16 BY ZBEECH.    Chloride 111 101 - 111 mmol/L   CO2 25 22 - 32 mmol/L   Glucose, Bld 261 (H) 65 - 99 mg/dL   BUN 44 (H) 6 - 20 mg/dL   Creatinine, Ser 2.41 0.61 - 1.24 mg/dL   Calcium 7.9 (L) 8.9 - 10.3 mg/dL   Phosphorus 3.0 2.5 - 4.6 mg/dL   Albumin 1.8 (L) 3.5 - 5.0 g/dL   GFR calc non Af Amer >60 >60 mL/min   GFR calc Af Amer >60 >60 mL/min    Comment: (NOTE) The eGFR has been calculated using the CKD EPI equation. This calculation has not been validated in all clinical situations. eGFR's persistently <60 mL/min signify possible Chronic Kidney Disease.    Anion gap 7 5 - 15  Magnesium     Status: Abnormal   Collection Time: 04/29/16  5:00 AM  Result Value Ref Range   Magnesium 1.6 (L) 1.7 - 2.4 mg/dL  CBC     Status: Abnormal   Collection Time: 04/29/16  5:00 AM    Result Value Ref Range   WBC 9.5 4.0 - 10.5 K/uL   RBC 2.82 (L) 4.22 - 5.81 MIL/uL   Hemoglobin 7.6 (L) 13.0 - 17.0 g/dL    Comment: CONSISTENT WITH PREVIOUS RESULT   HCT 25.3 (L) 39.0 - 52.0 %   MCV 89.7 78.0 - 100.0 fL   MCH 27.0 26.0 - 34.0 pg   MCHC 30.0 30.0 - 36.0 g/dL   RDW 59.0 (H) 17.2 - 41.9 %   Platelets 259 150 - 400 K/uL  Heparin level (unfractionated)     Status: Abnormal   Collection Time: 04/29/16  5:00 AM  Result Value Ref Range   Heparin Unfractionated 0.29 (L) 0.30 - 0.70 IU/mL    Comment:        IF HEPARIN RESULTS ARE BELOW EXPECTED VALUES, AND PATIENT DOSAGE HAS BEEN CONFIRMED, SUGGEST FOLLOW UP TESTING OF ANTITHROMBIN III LEVELS.   Glucose, capillary     Status: Abnormal   Collection Time: 04/29/16  8:11 AM  Result Value Ref Range   Glucose-Capillary 264 (H) 65 - 99 mg/dL   Comment 1 Notify RN   Lactic acid, plasma     Status: None   Collection Time: 04/29/16 10:00 AM  Result Value Ref Range   Lactic Acid, Venous 1.8 0.5 - 1.9 mmol/L  Hepatic function panel     Status: Abnormal   Collection Time: 04/29/16 11:20 AM  Result Value Ref Range   Total Protein 6.5 6.5 - 8.1 g/dL   Albumin 1.8 (L) 3.5 - 5.0 g/dL   AST 52 (H) 15 - 41 U/L   ALT 54 17 - 63 U/L   Alkaline  Phosphatase 142 (H) 38 - 126 U/L   Total Bilirubin 0.9 0.3 - 1.2 mg/dL   Bilirubin, Direct 0.3 0.1 - 0.5 mg/dL   Indirect Bilirubin 0.6 0.3 - 0.9 mg/dL  CK     Status: None   Collection Time: 04/29/16 11:20 AM  Result Value Ref Range   Total CK 136 49 - 397 U/L  Procalcitonin - Baseline     Status: None   Collection Time: 04/29/16 11:20 AM  Result Value Ref Range   Procalcitonin 0.71 ng/mL    Comment:        Interpretation: PCT > 0.5 ng/mL and <= 2 ng/mL: Systemic infection (sepsis) is possible, but other conditions are known to elevate PCT as well. (NOTE)         ICU PCT Algorithm               Non ICU PCT Algorithm    ----------------------------      ------------------------------         PCT < 0.25 ng/mL                 PCT < 0.1 ng/mL     Stopping of antibiotics            Stopping of antibiotics       strongly encouraged.               strongly encouraged.    ----------------------------     ------------------------------       PCT level decrease by               PCT < 0.25 ng/mL       >= 80% from peak PCT       OR PCT 0.25 - 0.5 ng/mL          Stopping of antibiotics                                             encouraged.     Stopping of antibiotics           encouraged.    ----------------------------     ------------------------------       PCT level decrease by              PCT >= 0.25 ng/mL       < 80% from peak PCT        AND PCT >= 0.5 ng/mL             Continuing antibiotics                                              encouraged.       Continuing antibiotics            encouraged.    ----------------------------     ------------------------------     PCT level increase compared          PCT > 0.5 ng/mL         with peak PCT AND          PCT >= 0.5 ng/mL             Escalation of antibiotics  strongly encouraged.      Escalation of antibiotics        strongly encouraged.   Glucose, capillary     Status: Abnormal   Collection Time: 04/29/16 11:36 AM  Result Value Ref Range   Glucose-Capillary 271 (H) 65 - 99 mg/dL   Comment 1 Notify RN   Heparin level (unfractionated)     Status: None   Collection Time: 04/29/16  1:30 PM  Result Value Ref Range   Heparin Unfractionated 0.42 0.30 - 0.70 IU/mL    Comment:        IF HEPARIN RESULTS ARE BELOW EXPECTED VALUES, AND PATIENT DOSAGE HAS BEEN CONFIRMED, SUGGEST FOLLOW UP TESTING OF ANTITHROMBIN III LEVELS.   Basic metabolic panel     Status: Abnormal   Collection Time: 04/29/16  3:00 PM  Result Value Ref Range   Sodium 147 (H) 135 - 145 mmol/L   Potassium 3.9 3.5 - 5.1 mmol/L    Comment: DELTA CHECK NOTED   Chloride 116 (H)  101 - 111 mmol/L   CO2 25 22 - 32 mmol/L   Glucose, Bld 171 (H) 65 - 99 mg/dL   BUN 40 (H) 6 - 20 mg/dL   Creatinine, Ser 1.38 0.61 - 1.24 mg/dL   Calcium 8.0 (L) 8.9 - 10.3 mg/dL   GFR calc non Af Amer >60 >60 mL/min   GFR calc Af Amer >60 >60 mL/min    Comment: (NOTE) The eGFR has been calculated using the CKD EPI equation. This calculation has not been validated in all clinical situations. eGFR's persistently <60 mL/min signify possible Chronic Kidney Disease.    Anion gap 6 5 - 15  Glucose, capillary     Status: Abnormal   Collection Time: 04/29/16  3:04 PM  Result Value Ref Range   Glucose-Capillary 178 (H) 65 - 99 mg/dL   Comment 1 Notify RN   Glucose, capillary     Status: Abnormal   Collection Time: 04/29/16  8:08 PM  Result Value Ref Range   Glucose-Capillary 192 (H) 65 - 99 mg/dL   Comment 1 Notify RN    Comment 2 Document in Chart   Glucose, capillary     Status: Abnormal   Collection Time: 04/29/16 11:47 PM  Result Value Ref Range   Glucose-Capillary 204 (H) 65 - 99 mg/dL   Comment 1 Notify RN    Comment 2 Document in Chart   Glucose, capillary     Status: Abnormal   Collection Time: 04/30/16  3:45 AM  Result Value Ref Range   Glucose-Capillary 203 (H) 65 - 99 mg/dL   Comment 1 Notify RN    Comment 2 Document in Chart   Heparin level (unfractionated)     Status: None   Collection Time: 04/30/16  4:35 AM  Result Value Ref Range   Heparin Unfractionated 0.44 0.30 - 0.70 IU/mL    Comment:        IF HEPARIN RESULTS ARE BELOW EXPECTED VALUES, AND PATIENT DOSAGE HAS BEEN CONFIRMED, SUGGEST FOLLOW UP TESTING OF ANTITHROMBIN III LEVELS.   Renal function panel     Status: Abnormal   Collection Time: 04/30/16  4:43 AM  Result Value Ref Range   Sodium 146 (H) 135 - 145 mmol/L   Potassium 3.1 (L) 3.5 - 5.1 mmol/L    Comment: DELTA CHECK NOTED   Chloride 112 (H) 101 - 111 mmol/L   CO2 26 22 - 32 mmol/L   Glucose, Bld 234 (H) 65 - 99  mg/dL   BUN 38 (H) 6 - 20  mg/dL   Creatinine, Ser 1.04 0.61 - 1.24 mg/dL   Calcium 8.1 (L) 8.9 - 10.3 mg/dL   Phosphorus 3.4 2.5 - 4.6 mg/dL   Albumin 2.0 (L) 3.5 - 5.0 g/dL   GFR calc non Af Amer >60 >60 mL/min   GFR calc Af Amer >60 >60 mL/min    Comment: (NOTE) The eGFR has been calculated using the CKD EPI equation. This calculation has not been validated in all clinical situations. eGFR's persistently <60 mL/min signify possible Chronic Kidney Disease.    Anion gap 8 5 - 15  Magnesium     Status: None   Collection Time: 04/30/16  4:43 AM  Result Value Ref Range   Magnesium 1.8 1.7 - 2.4 mg/dL  CBC     Status: Abnormal   Collection Time: 04/30/16  4:43 AM  Result Value Ref Range   WBC 10.7 (H) 4.0 - 10.5 K/uL   RBC 2.96 (L) 4.22 - 5.81 MIL/uL   Hemoglobin 7.9 (L) 13.0 - 17.0 g/dL   HCT 26.5 (L) 39.0 - 52.0 %   MCV 89.5 78.0 - 100.0 fL   MCH 26.7 26.0 - 34.0 pg   MCHC 29.8 (L) 30.0 - 36.0 g/dL   RDW 18.4 (H) 11.5 - 15.5 %   Platelets 272 150 - 400 K/uL  Phosphorus     Status: None   Collection Time: 04/30/16  4:43 AM  Result Value Ref Range   Phosphorus 3.4 2.5 - 4.6 mg/dL  Glucose, capillary     Status: Abnormal   Collection Time: 04/30/16  7:51 AM  Result Value Ref Range   Glucose-Capillary 224 (H) 65 - 99 mg/dL   Comment 1 Notify RN    Comment 2 Document in Chart   Glucose, capillary     Status: Abnormal   Collection Time: 04/30/16 12:19 PM  Result Value Ref Range   Glucose-Capillary 201 (H) 65 - 99 mg/dL   Comment 1 Notify RN    Comment 2 Document in Chart    '@BRIEFLABTABLE'$ (sdes,specrequest,cult,reptstatus)   ) Recent Results (from the past 720 hour(s))  Blood Culture (routine x 2)     Status: None   Collection Time: 04/19/2016  7:30 AM  Result Value Ref Range Status   Specimen Description BLOOD LEFT ANTECUBITAL  Final   Special Requests BOTTLES DRAWN AEROBIC AND ANAEROBIC 5CC  Final   Culture NO GROWTH 5 DAYS  Final   Report Status 04/26/2016 FINAL  Final  Blood Culture  (routine x 2)     Status: Abnormal   Collection Time: 04/18/2016  7:35 AM  Result Value Ref Range Status   Specimen Description BLOOD RIGHT HAND  Final   Special Requests BOTTLES DRAWN AEROBIC ONLY 5CC  Final   Culture  Setup Time   Final    GRAM POSITIVE COCCI IN CLUSTERS AEROBIC BOTTLE ONLY Organism ID to follow CRITICAL RESULT CALLED TO, READ BACK BY AND VERIFIED WITH: J. CARNEY, PHARM AT 0740 ON 101[address] BY Rhea Bleacher    Culture (A)  Final    STAPHYLOCOCCUS SPECIES (COAGULASE NEGATIVE) THE SIGNIFICANCE OF ISOLATING THIS ORGANISM FROM A SINGLE SET OF BLOOD CULTURES WHEN MULTIPLE SETS ARE DRAWN IS UNCERTAIN. PLEASE NOTIFY THE MICROBIOLOGY DEPARTMENT WITHIN ONE WEEK IF SPECIATION AND SENSITIVITIES ARE REQUIRED.    Report Status 04/24/2016 FINAL  Final  Blood Culture ID Panel (Reflexed)     Status: Abnormal   Collection Time: 05/06/2016  7:35  AM  Result Value Ref Range Status   Enterococcus species NOT DETECTED NOT DETECTED Final   Listeria monocytogenes NOT DETECTED NOT DETECTED Final   Staphylococcus species DETECTED (A) NOT DETECTED Final    Comment: CRITICAL RESULT CALLED TO, READ BACK BY AND VERIFIED WITH: J. CARNEY, [place] [address] BY S. YARBROUGH    Staphylococcus aureus NOT DETECTED NOT DETECTED Final   Methicillin resistance DETECTED (A) NOT DETECTED Final    Comment: CRITICAL RESULT CALLED TO, READ BACK BY AND VERIFIED WITH: J. CARNEY, [place] [address] BY S. YARBROUGH    Streptococcus species NOT DETECTED NOT DETECTED Final   Streptococcus agalactiae NOT DETECTED NOT DETECTED Final   Streptococcus pneumoniae NOT DETECTED NOT DETECTED Final   Streptococcus pyogenes NOT DETECTED NOT DETECTED Final   Acinetobacter baumannii NOT DETECTED NOT DETECTED Final   Enterobacteriaceae species NOT DETECTED NOT DETECTED Final   Enterobacter cloacae complex NOT DETECTED NOT DETECTED Final   Escherichia coli NOT DETECTED NOT DETECTED Final   Klebsiella oxytoca NOT DETECTED  NOT DETECTED Final   Klebsiella pneumoniae NOT DETECTED NOT DETECTED Final   Proteus species NOT DETECTED NOT DETECTED Final   Serratia marcescens NOT DETECTED NOT DETECTED Final   Haemophilus influenzae NOT DETECTED NOT DETECTED Final   Neisseria meningitidis NOT DETECTED NOT DETECTED Final   Pseudomonas aeruginosa NOT DETECTED NOT DETECTED Final   Candida albicans NOT DETECTED NOT DETECTED Final   Candida glabrata NOT DETECTED NOT DETECTED Final   Candida krusei NOT DETECTED NOT DETECTED Final   Candida parapsilosis NOT DETECTED NOT DETECTED Final   Candida tropicalis NOT DETECTED NOT DETECTED Final  Urine culture     Status: None   Collection Time: 05/07/2016  8:41 AM  Result Value Ref Range Status   Specimen Description URINE, CATHETERIZED  Final   Special Requests NONE  Final   Culture NO GROWTH  Final   Report Status 04/22/2016 FINAL  Final  MRSA PCR Screening     Status: None   Collection Time: 04/28/2016 12:20 PM  Result Value Ref Range Status   MRSA by PCR NEGATIVE NEGATIVE Final    Comment:        The GeneXpert MRSA Assay (FDA approved for NASAL specimens only), is one component of a comprehensive MRSA colonization surveillance program. It is not intended to diagnose MRSA infection nor to guide or monitor treatment for MRSA infections.   Culture, respiratory (NON-Expectorated)     Status: None   Collection Time: 04/22/16 12:18 PM  Result Value Ref Range Status   Specimen Description TRACHEAL ASPIRATE  Final   Special Requests Normal  Final   Gram Stain   Final    MODERATE WBC PRESENT, PREDOMINANTLY PMN FEW GRAM POSITIVE COCCI IN PAIRS IN SINGLES    Culture   Final    MODERATE METHICILLIN RESISTANT STAPHYLOCOCCUS AUREUS   Report Status 04/25/2016 FINAL  Final   Organism ID, Bacteria METHICILLIN RESISTANT STAPHYLOCOCCUS AUREUS  Final      Susceptibility   Methicillin resistant staphylococcus aureus - MIC*    CIPROFLOXACIN >=8 RESISTANT Resistant     ERYTHROMYCIN  >=8 RESISTANT Resistant     GENTAMICIN <=0.5 SENSITIVE Sensitive     OXACILLIN >=4 RESISTANT Resistant     TETRACYCLINE <=1 SENSITIVE Sensitive     VANCOMYCIN 1 SENSITIVE Sensitive     TRIMETH/SULFA <=10 SENSITIVE Sensitive     CLINDAMYCIN >=8 RESISTANT Resistant     RIFAMPIN <=0.5 SENSITIVE Sensitive  Inducible Clindamycin NEGATIVE Sensitive     * MODERATE METHICILLIN RESISTANT STAPHYLOCOCCUS AUREUS  Respiratory Panel by PCR     Status: None   Collection Time: 04/22/16  1:11 PM  Result Value Ref Range Status   Adenovirus NOT DETECTED NOT DETECTED Final   Coronavirus 229E NOT DETECTED NOT DETECTED Final   Coronavirus HKU1 NOT DETECTED NOT DETECTED Final   Coronavirus NL63 NOT DETECTED NOT DETECTED Final   Coronavirus OC43 NOT DETECTED NOT DETECTED Final   Metapneumovirus NOT DETECTED NOT DETECTED Final   Rhinovirus / Enterovirus NOT DETECTED NOT DETECTED Final   Influenza A NOT DETECTED NOT DETECTED Final   Influenza B NOT DETECTED NOT DETECTED Final   Parainfluenza Virus 1 NOT DETECTED NOT DETECTED Final   Parainfluenza Virus 2 NOT DETECTED NOT DETECTED Final   Parainfluenza Virus 3 NOT DETECTED NOT DETECTED Final   Parainfluenza Virus 4 NOT DETECTED NOT DETECTED Final   Respiratory Syncytial Virus NOT DETECTED NOT DETECTED Final   Bordetella pertussis NOT DETECTED NOT DETECTED Final   Chlamydophila pneumoniae NOT DETECTED NOT DETECTED Final   Mycoplasma pneumoniae NOT DETECTED NOT DETECTED Final  C difficile quick scan w PCR reflex     Status: None   Collection Time: 04/25/16 10:56 PM  Result Value Ref Range Status   C Diff antigen NEGATIVE NEGATIVE Final   C Diff toxin NEGATIVE NEGATIVE Final   C Diff interpretation No C. difficile detected.  Final     Impression/Recommendation  Active Problems:   Sepsis ([place])   Acute respiratory failure with hypoxia ([facility])   Endotracheally intubated   JASMEET GEHL is a 60 y.o. male with  esident with hx CHF, CAD s/p remote stent,  DM, HTN, Aflutter s/p ablation WITH PACEMAKER  on coumadin, severe PVD s/p R BKA, being treated for? infection at SNF with rocephin.  Presented 10/12 from SNF after being found unresponsive with agonal breaths sp broad-spectrum antibiotics of vancomycin and cefepime narrowed to vancomycin after MRSA isolated from his 3 cultures still on ventilator and still some low-grade temperatures.  #1 Low-grade temperatures and fever of unknown origin:  I would not rush to do a full FUO workup yet with him still on the ventilator  Obviously his foot on the left is something that I would like to image more aggressively  WOuld be good to know what he was being treated for at SNF  I am ok observing him off antibiotics for now  If his resp status or clinical status worsens would get new blood and resp cultures and restart vancomycin and add back some gram negative coverage but for now would like to restrain from so doing  He DOES have a Pacemaker so IF he turns out to be bacteremic during this admission or find out that he was being treated for bacteremia before then his pacemaker will need to be investigated with a transesophageal echocardiogram and certainly if the bacteremia was with something like Staphylococcus aureus the pacemaker would need to be removed.  I would similarly consider look at the pacemaker with a TEE if the patient begins to have higher fevers  He DOES need an HIV and Hep panel done  #2 Diabetic foot ulcers: will get plain films of the right foot when stable. Consider MRI with gadolinum IF plain films not revealing and osteo of concern  04/30/2016, 4:02 PM   Thank you so much for this interesting consult  [facility] for [facility] [phone] (  pager) [phone] (office) 04/30/2016, 4:02 PM  Rhina Br[place] 04/30/2016, 4:02 PM

## 2016-04-30 NOTE — Progress Notes (Signed)
[place] for Heparin Indication: atrial fibrillation  Allergies  Allergen Reactions  . Niaspan [Niacin Er] Hives and Other (See Comments)    Reaction:  Facial redness/hotness     Patient Measurements: Height: '5\' 10"'$  (177.8 cm) Weight: 239 lb 3.2 oz (108.5 kg) IBW/kg (Calculated) : 73  Heparin Wt: 97 kg  Vital Signs: Temp: 100.6 F (38.1 C) (10/21 0825) Temp Source: Core (Comment) (10/21 0800) BP: 136/73 (10/21 0800) Pulse Rate: 70 (10/21 0825)  Labs:  Recent Labs  04/28/16 0430  04/29/16 0500 04/29/16 1120 04/29/16 1330 04/29/16 1500 04/30/16 0435 04/30/16 0443  HGB 7.6*  --  7.6*  --   --   --   --  7.9*  HCT 25.1*  --  25.3*  --   --   --   --  26.5*  PLT 263  --  259  --   --   --   --  272  HEPARINUNFRC  --   < > 0.29*  --  0.42  --  0.44  --   CREATININE 1.02  --  1.01  --   --  1.05  --  1.04  CKTOTAL  --   --   --  136  --   --   --   --   < > = values in this interval not displayed.  Estimated Creatinine Clearance: 90.8 mL/min (by C-G formula based on SCr of 1.04 mg/dL).   Medications:  Scheduled:  . aspirin  81 mg Oral Daily  . chlorhexidine gluconate (MEDLINE KIT)  15 mL Mouth Rinse BID  . collagenase   Topical Daily  . diltiazem  15 mg Per Tube Q6H  . furosemide  80 mg Intravenous TID  . insulin aspart  0-20 Units Subcutaneous Q4H  . insulin aspart  4 Units Subcutaneous Q4H  . insulin detemir  5 Units Subcutaneous Daily  . magnesium sulfate 1 - 4 g bolus IVPB  2 g Intravenous Once  . mouth rinse  15 mL Mouth Rinse QID  . pantoprazole sodium  40 mg Per Tube Q1200  . potassium chloride  40 mEq Oral BID    Assessment: 54 yoM with atrial flutter on warfarin PTA now on heparin per pharmacy dosing. abdominal US negative for a bleed. Heparin level slightly subtherapeutic (0.29) on gtt at 2400 units/hr. No issues with line or bleeding reported per RN.  This morning's heparin level remains therapeutic at 0.44 on  heparin 2600 units/hr. No issues with infusion or bleeding.  Goal of Therapy: Heparin level 0.3 - 0.7 units/mL Monitor platelets by anticoagulation protocol: Yes  Plan:  Continue heparin 2600 units/hr Daily HL/CBC Monitor s/sx of bleeding  Andrey Cota. Diona Foley, PharmD, BCPS Clinical Pharmacist Pager [phone] 04/30/2016 9:25 AM

## 2016-04-30 NOTE — Progress Notes (Signed)
PULMONARY / CRITICAL CARE MEDICINE   Name: Clayton [name] MRN: [id number] DOB: 11-11-1955    ADMISSION DATE:  March 17, 2016  REFERRING MD:  Rubin PayorPickering (EDP)   CHIEF COMPLAINT:  Sepsis, respiratory failure   brief 60yo male SNF resident with hx CHF, CAD s/p remote stent, DM, HTN, Aflutter s/p ablation on coumadin, severe PVD s/p R BKA, being treated for unknown infection at SNF with rocephin.  Presented 10/12 from SNF after being found unresponsive with agonal breaths.  Bagged by EMS en route and intubated on arrival to ER.  In ER was febrile to 104, hypotensive with SBP 70's.  Labs notable for AKI with K 7.1, glucose 500, WBC 36, lactate 3.6.  PCCM consulted for admit.     STUDIES:  Renal U/S 10/12:  No abnormality or hydronephrosis.  TTE 10/13:  Difficult study. Mild LVH w/ EF 55%. Possible akinesis apical myocardium without obvious thrombus.Moderate aortic stenosis w/ mild mitral stenosis. RV poorly visualized. CT abdomen 10/15: no evidence for retroperitoneal hemorrhage. RLL central airspace disease compatible with aspiration and/or PNA CXR: 10/17: unchanged from 10/16. No consolidation. Final read pending.  RUQ US 10/18: Cholelithiasis without sonographic evidence acute cholecystitis. A hepatobiliary scintigraphy may provide better evaluation of the gallbladder if an acute cholecystitis is clinically suspected. Fatty Liver.  CXR 10/19: No pulmonary edema. Stable endotracheal tube position. Hazy right infrahilar and right base atelectasis or infiltrate CXR 10/20: pending  MICROBIOLOGY: MRSA PCR 10/12:  Negative Blood Ctx x2 10/12 >> 1/2 Coag Neg Staph; 1/2 NG x 5 days  Urine Ctx 10/12:  Negative  Tracheal Asp Ctx 10/13 >> MRSA Respiratory Viral Panel PCR 10/13:  Negative  C dif 10/6 - negaive ....... Blood 10/20 Urine 10/20 Trach 10/20     ANTIBIOTICS: Zosyn 10/12 x1 dose; 10/16 >>10/17 Vancomycin 10/12 >>10/18 Cefepime 10/12 >> 10/16  LINES/TUBES: OETT 7.5 10/12 >>  RUE  PICC (SNF) 10/11 >>   Foley 10/12 >> PEG >> PIV x1   SIGNIFICANT EVENTS: 10/12 - Admit & intubation in ED 10/14 - switched from Levo to Neo due to intermittent tachycardia  10/15 - off pressor 10/17 - Failed wean 10/18 - failed wean started lasix  For 20L + bal sinc admit 10/19 - Failed wean  04/29/16 -  No events overnight. Patient had continued low grade fevers up to 100.42F and 101.1 this AM. Vitals otherwise stable overnight.  Nursing mentioned of some elevated CBGs; patient is on tube feeds. We are diuresing patient and he had 4.8 L out overnight; still net positive of 15L from 17L. Potassium 2.7 this AM; repletion ordered.    SUBJECTIVE/OVERNIGHT/INTERVAL HX 04/30/16 - diuresing. Now down to 12L +ve bal.Still febrile. Failed SBT yesterday and right now to RR 50s.Seems more awake     VITAL SIGNS: BP 111/84   Pulse 70   Temp (!) 100.6 F (38.1 C)   Resp 13   Ht 5\' 10"  (1.778 m)   Wt 108.5 kg (239 lb 3.2 oz)   SpO2 100%   BMI 34.32 kg/m   HEMODYNAMICS:    VENTILATOR SETTINGS: Vent Mode: CPAP;PSV FiO2 (%):  [40 %] 40 % Set Rate:  [22 bmp] 22 bmp Vt Set:  [580 mL] 580 mL PEEP:  [5 cmH20] 5 cmH20 Pressure Support:  [12 cmH20] 12 cmH20 Plateau Pressure:  [20 cmH20-24 cmH20] 21 cmH20  INTAKE / OUTPUT: I/O last 3 completed shifts: In: 2853.9 [I.V.:1583.9; Other:370; NG/GT:600; IV Piggyback:300] Out: 8815 [Urine:8815]  PHYSICAL EXAMINATION:  General:  NAD  Neuro:  On precedex - nodded yes/no HEENT:  ETT in place. No scleral icterus or injection. Cardiovascular: regular rhythm. No appreciable JVD. Lungs: coarse breath sounds. Symmetric chest rise on ventilator.  Abdomen:  Somewhat hypoactive bowel sounds, nontender to palpation ,  Flexi-Seal rectal tube in place for stool. Musculoskeletal:  Warm and dry. 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 BAck - sacaral decub -pre-admit +  LABS:  PULMONARY No results for input(s): PHART, PCO2ART, PO2ART, HCO3,  TCO2, O2SAT in the last 168 hours.  Invalid input(s): PCO2, PO2  CBC  Recent Labs Lab 04/28/16 0430 04/29/16 0500 04/30/16 0443  HGB 7.6* 7.6* 7.9*  HCT 25.1* 25.3* 26.5*  WBC 7.6 9.5 10.7*  PLT 263 259 272    COAGULATION No results for input(s): INR in the last 168 hours.  CARDIAC  No results for input(s): TROPONINI in the last 168 hours. No results for input(s): PROBNP in the last 168 hours.   CHEMISTRY  Recent Labs Lab 04/26/16 0500  04/27/16 0456 04/27/16 0457 04/28/16 0430 04/29/16 0500 04/29/16 1500 04/30/16 0443  NA 141  < >  --  145 148* 143 147* 146*  K 3.5  < >  --  3.4* 4.0 2.7* 3.9 3.1*  CL 107  < >  --  111 117* 111 116* 112*  CO2 25  < >  --  24 23 25 25 26   GLUCOSE 218*  < >  --  101* 147* 261* 171* 234*  BUN 73*  < >  --  65* 47* 44* 40* 38*  CREATININE 1.32*  < >  --  1.03 1.02 1.01 1.05 1.04  CALCIUM 7.9*  < >  --  8.2* 8.3* 7.9* 8.0* 8.1*  MG 2.0  --  2.0  --  2.0 1.6*  --  1.8  PHOS 3.0  --   --  3.4 3.0 3.0  --  3.4  3.4  < > = values in this interval not displayed. Estimated Creatinine Clearance: 90.8 mL/min (by C-G formula based on SCr of 1.04 mg/dL).   LIVER  Recent Labs Lab 04/27/16 0842 04/28/16 0430 04/29/16 0500 04/29/16 1120 04/30/16 0443  AST 80*  --   --  52*  --   ALT 58  --   --  54  --   ALKPHOS 170*  --   --  142*  --   BILITOT 1.1  --   --  0.9  --   PROT 6.0*  --   --  6.5  --   ALBUMIN 1.7* 1.8* 1.8* 1.8* 2.0*     INFECTIOUS  Recent Labs Lab 04/29/16 1000 04/29/16 1120  LATICACIDVEN 1.8  --   PROCALCITON  --  0.71     ENDOCRINE CBG (last 3)   Recent Labs  04/29/16 2347 04/30/16 0345 04/30/16 0751  GLUCAP 204* 203* 224*         IMAGING x48h  - image(s) personally visualized  -   highlighted in bold Dg Chest Port 1 View  Result Date: 04/30/2016 CLINICAL DATA:  Status post intubation EXAM: PORTABLE CHEST 1 VIEW COMPARISON:  04/29/2016 FINDINGS: Cardiac shadow is mildly enlarged.  Postsurgical changes are again seen. Endotracheal tube is again noted in satisfactory position but just above the carina. Right PICC line is seen in the right innominate vein and stable. The lungs are well aerated bilaterally. Very minimal right basilar atelectasis is seen. IMPRESSION: No significant interval change from the previous day. Persistent right basilar atelectasis  is noted. Electronically Signed   By: Alcide Clever M.D.   On: 04/30/2016 07:30   Dg Chest Port 1 View  Result Date: 04/29/2016 CLINICAL DATA:  Intubation EXAM: PORTABLE CHEST 1 VIEW COMPARISON:  Portable exam 0420 hours compared to 04/28/2016 FINDINGS: Tip of endotracheal tube and carina poorly visualized, tip estimated 13 mm above carina. RIGHT arm PICC line tip projects over junction of RIGHT subclavian vein and SVC, unchanged. Upper normal heart size post median sternotomy. Mediastinal contours normal. Decreased lung volumes with bibasilar atelectasis versus infiltrate. Aeration at RIGHT lung base appears minimally improved. No gross pleural effusion or pneumothorax. IMPRESSION: Mild bibasilar atelectasis versus infiltrate with slightly improved aeration at RIGHT base versus previous exam. Electronically Signed   By: Ulyses Southward M.D.   On: 04/29/2016 07:47       ASSESSMENT / PLAN:  Mr. Kenyon is a  60 y.o. male with PMH CHF, CAD s/p remote stent, DM, HTN, Aflutter s/p ablation on anticoagulation, severe PVD s/p R BKA. Presenting with septic shock likely from MRSA PNA. Is on Heparin for history of a flutter.  Attempted SBT 10/18 and 10/19 but did not pass; Possibly fluid overloaded therefore diuresing  Lasix.   INFECTIOUS A: Severe Sepsis, improving  RLL MRSA PNA: tracheal aspirate culture with MRSA  04/22/16  S/p Vancomycin for 7 days  S/p Cefepime and Zosyn  Current 04/30/2016 = still febrile  P Pan culture 04/30/16 ID consult - d/w Dr Synthia Innocent    PULMONARY A: Acute Hypoxic Respiratory Failure - RLL central  airspace disease compatible with aspiration/PNA on Imaging (CT). Treated with abx.   - failed sBT - still 12L +ve balance + fever  P:   Full Vent Support  Daily PS Wean & SBT Intermittent CXR & ABG Albuterol nebs prn [place] lasix   CARDIOVASCULAR A: #baseline H/O CAD w/ Stent H/O CHF - Dilated LV w/ decreased function November 2016. H/O A Flutter - S/P ablation   #since admission Shock - Likely septic. Resolved and off presors.  Elevated Troponin I - Likely demand ischemia. Echo was difficult to interpret. Questionable apical akinesis. Aortic Stenosis - Seen on poor quality TTE. SVT - Questionable Atrial flutter, HR within normal limits  - 10/21 - maintains bp/hr. On heparin for A Fib   P:  Cardizem 15mg  q 6 hours  Heparin drip  Continuous telemetry monitoring Vitals per unit protocol Goal MAP >65 & SBP >90 ASA 81mg  VT daily Lasix   RENAL A: Acute Renal Insuff - resolved 04/26/16   - mild low mag and low K on 04/30/16    P:   Monitor with lasix Replete mag and k  GASTROINTESTINAL A: Loose stools: c diff negative  RUQ tenderness : RUQ [place] with cholelithiasis without sonographic evidence of cholecystitis. Tenderness resolved 10/20  P:   NPO  Tube feeds  Protonix VT daily   HEMATOLOGIC A: Anemia - stable hgb. S/p 1 unit pRBC 10/16. Hgb stable this AM. No signs of active bleeding . No RP bleed 04/24/16 CT   P:  Trending cell counts daily w/ CBC Trending Hgb/Hct q12hr Transfuse for Hgb <7.0 Heparin Drip   ENDOCRINE A: H/O DM - A1c 10.6 in June 2017. CBGs in 200s in the setting of tube feeds   P:   SSI per Resistant Algorithm Start Scheduled Novolog 4 units q 4 hrs (with tube feeds) Start Levemir 5 units  Accu-Checks q4hr  NEUROLOGIC A: Acute Encephalopathy - Multifactorial from hypoxia and probable  toxic metabolic. Sedation on Ventilator    - definitely a barrier to extubation  P:   RASS goal: 0 to -1 Precedex gtt  Versed IV prn  sedation   INTEGUMENT:  A: Sacral Pressure Injury  P: - wound care: santyl with saline moistened gauze daily covered with dry gauze    GLOBAL Fails sbt. Continue lasix. Address ongoing fever- pan culkture + ID consult   FAMILY None at bedside 04/30/2016   The patient is critically ill with multiple organ systems failure and requires high complexity decision making for assessment and support, frequent evaluation and titration of therapies, application of advanced monitoring technologies and extensive interpretation of multiple databases.   Critical Care Time devoted to patient care services described in this note is  30  Minutes. This time reflects time of care of this signee Dr Kalman Shan. This critical care time does not reflect procedure time, or teaching time or supervisory time of PA/NP/Med student/Med Resident etc but could involve care discussion time    Dr. Kalman Shan, M.D., [facility].C.P Pulmonary and Critical Care Medicine Staff Physician [facility] Pager: [phone], If no answer or between  15:00h - 7:00h: call [phone]  04/30/2016 8:07 AM

## 2016-04-30 NOTE — Progress Notes (Signed)
Inpatient Diabetes Program Recommendations  AACE/ADA: New Consensus Statement on Inpatient Glycemic Control (2015)  Target Ranges:  Prepandial:   less than 140 mg/dL      Peak postprandial:   less than 180 mg/dL (1-2 hours)      Critically ill patients:  140 - 180 mg/dL   Results for Clayton [name], Clayton K (MRN [id number]) as of 04/30/2016 22:12  Ref. Range 04/29/2016 23:47 04/30/2016 03:45 04/30/2016 07:51 04/30/2016 12:19 04/30/2016 19:32  Glucose-Capillary Latest Ref Range: 65 - 99 mg/dL 696204 (H) 295203 (H) 284224 (H) 201 (H) 224 (H)    Home DM Meds: Levemir 15 units QHS  Current Insulin Orders: Levemir 5 units daily      Novolog Resistant Correction Scale/ SSI (0-20 units) Q4 hours      Novolog 4 units Q4 hours (TF Coverage)      MD- Please consider the following in-hospital insulin adjustments:  1. Increase Levemir to 15 units daily (home dose)  2. Increase Novolog tube feed coverage to Novolog 6 units Q4 hours      --Will follow patient during hospitalization--  Ambrose FinlandJeannine Johnston Aileena Iglesia RN, MSN, CDE Diabetes Coordinator Inpatient Glycemic Control Team Team Pager: [phone] (8a-5p)

## 2016-05-01 ENCOUNTER — Inpatient Hospital Stay (HOSPITAL_COMMUNITY): Payer: Medicare HMO

## 2016-05-01 DIAGNOSIS — E11621 Type 2 diabetes mellitus with foot ulcer: Secondary | ICD-10-CM

## 2016-05-01 DIAGNOSIS — L97509 Non-pressure chronic ulcer of other part of unspecified foot with unspecified severity: Secondary | ICD-10-CM

## 2016-05-01 DIAGNOSIS — R7881 Bacteremia: Secondary | ICD-10-CM

## 2016-05-01 DIAGNOSIS — R509 Fever, unspecified: Secondary | ICD-10-CM

## 2016-05-01 DIAGNOSIS — B952 Enterococcus as the cause of diseases classified elsewhere: Secondary | ICD-10-CM

## 2016-05-01 LAB — BLOOD CULTURE ID PANEL (REFLEXED)
Acinetobacter baumannii: NOT DETECTED
CANDIDA ALBICANS: NOT DETECTED
CANDIDA TROPICALIS: NOT DETECTED
Candida glabrata: NOT DETECTED
Candida krusei: NOT DETECTED
Candida parapsilosis: NOT DETECTED
ENTEROBACTERIACEAE SPECIES: NOT DETECTED
ENTEROCOCCUS SPECIES: DETECTED — AB
Enterobacter cloacae complex: NOT DETECTED
Escherichia coli: NOT DETECTED
HAEMOPHILUS INFLUENZAE: NOT DETECTED
KLEBSIELLA PNEUMONIAE: NOT DETECTED
Klebsiella oxytoca: NOT DETECTED
LISTERIA MONOCYTOGENES: NOT DETECTED
METHICILLIN RESISTANCE: DETECTED — AB
Neisseria meningitidis: NOT DETECTED
PROTEUS SPECIES: NOT DETECTED
Pseudomonas aeruginosa: NOT DETECTED
STAPHYLOCOCCUS AUREUS BCID: NOT DETECTED
STREPTOCOCCUS PYOGENES: NOT DETECTED
Serratia marcescens: NOT DETECTED
Staphylococcus species: DETECTED — AB
Streptococcus agalactiae: NOT DETECTED
Streptococcus pneumoniae: NOT DETECTED
Streptococcus species: NOT DETECTED
VANCOMYCIN RESISTANCE: NOT DETECTED

## 2016-05-01 LAB — HEPARIN LEVEL (UNFRACTIONATED)
HEPARIN UNFRACTIONATED: 0.82 [IU]/mL — AB (ref 0.30–0.70)
HEPARIN UNFRACTIONATED: 0.78 [IU]/mL — AB (ref 0.30–0.70)
Heparin Unfractionated: 0.96 IU/mL — ABNORMAL HIGH (ref 0.30–0.70)

## 2016-05-01 LAB — GLUCOSE, CAPILLARY
GLUCOSE-CAPILLARY: 202 mg/dL — AB (ref 65–99)
GLUCOSE-CAPILLARY: 223 mg/dL — AB (ref 65–99)
GLUCOSE-CAPILLARY: 238 mg/dL — AB (ref 65–99)
GLUCOSE-CAPILLARY: 273 mg/dL — AB (ref 65–99)
GLUCOSE-CAPILLARY: 214 mg/dL — AB (ref 65–99)
GLUCOSE-CAPILLARY: 208 mg/dL — AB (ref 65–99)
GLUCOSE-CAPILLARY: 145 mg/dL — AB (ref 65–99)
Glucose-Capillary: 196 mg/dL — ABNORMAL HIGH (ref 65–99)
Glucose-Capillary: 246 mg/dL — ABNORMAL HIGH (ref 65–99)
Glucose-Capillary: 265 mg/dL — ABNORMAL HIGH (ref 65–99)
Glucose-Capillary: 241 mg/dL — ABNORMAL HIGH (ref 65–99)
Glucose-Capillary: 188 mg/dL — ABNORMAL HIGH (ref 65–99)

## 2016-05-01 LAB — SEDIMENTATION RATE

## 2016-05-01 LAB — CBC
HCT: 27.5 % — ABNORMAL LOW (ref 39.0–52.0)
Hemoglobin: 8.2 g/dL — ABNORMAL LOW (ref 13.0–17.0)
MCH: 27 pg (ref 26.0–34.0)
MCHC: 29.8 g/dL — AB (ref 30.0–36.0)
MCV: 90.5 fL (ref 78.0–100.0)
PLATELETS: 270 10*3/uL (ref 150–400)
RBC: 3.04 MIL/uL — ABNORMAL LOW (ref 4.22–5.81)
RDW: 18.6 % — AB (ref 11.5–15.5)
WBC: 11.3 10*3/uL — ABNORMAL HIGH (ref 4.0–10.5)

## 2016-05-01 LAB — RENAL FUNCTION PANEL
ALBUMIN: 2.1 g/dL — AB (ref 3.5–5.0)
Anion gap: 8 (ref 5–15)
BUN: 34 mg/dL — ABNORMAL HIGH (ref 6–20)
CALCIUM: 8.4 mg/dL — AB (ref 8.9–10.3)
CO2: 28 mmol/L (ref 22–32)
CREATININE: 1.03 mg/dL (ref 0.61–1.24)
Chloride: 111 mmol/L (ref 101–111)
GFR calc Af Amer: >60 mL/min (ref >60)
GFR calc non Af Amer: >60 mL/min (ref >60)
GLUCOSE: 220 mg/dL — AB (ref 65–99)
PHOSPHORUS: 3.6 mg/dL (ref 2.5–4.6)
Potassium: 3.4 mmol/L — ABNORMAL LOW (ref 3.5–5.1)
SODIUM: 147 mmol/L — AB (ref 135–145)

## 2016-05-01 LAB — C-REACTIVE PROTEIN: CRP: 4.2 mg/dL — ABNORMAL HIGH (ref <1.0)

## 2016-05-01 LAB — HIV ANTIBODY (ROUTINE TESTING W REFLEX): HIV SCREEN 4TH GENERATION: NONREACTIVE

## 2016-05-01 LAB — PHOSPHORUS: Phosphorus: 3.5 mg/dL (ref 2.5–4.6)

## 2016-05-01 LAB — URINE CULTURE: Culture: NO GROWTH

## 2016-05-01 LAB — PROCALCITONIN: Procalcitonin: 0.34 ng/mL

## 2016-05-01 LAB — PREALBUMIN: Prealbumin: 13.9 mg/dL — ABNORMAL LOW (ref 18–38)

## 2016-05-01 LAB — MAGNESIUM: Magnesium: 1.9 mg/dL (ref 1.7–2.4)

## 2016-05-01 MED ORDER — CEFAZOLIN SODIUM-DEXTROSE 2-4 GM/100ML-% IV SOLN
2.0000 g | Freq: Three times a day (TID) | INTRAVENOUS | Status: DC
  Filled 2016-05-01: qty 100

## 2016-05-01 MED ORDER — INSULIN ASPART 100 UNIT/ML ~~LOC~~ SOLN
2.0000 [IU] | SUBCUTANEOUS | Status: DC
  Administered 2016-05-01: 6 [IU] via SUBCUTANEOUS

## 2016-05-01 MED ORDER — SODIUM CHLORIDE 0.9 % IV SOLN
INTRAVENOUS | Status: DC
  Administered 2016-05-01: 2.1 [IU]/h via INTRAVENOUS
  Filled 2016-05-01: qty 2.5

## 2016-05-01 MED ORDER — FUROSEMIDE 10 MG/ML IJ SOLN
80.0000 mg | Freq: Two times a day (BID) | INTRAMUSCULAR | Status: DC
  Administered 2016-05-01 – 2016-05-02 (×3): 80 mg via INTRAVENOUS
  Filled 2016-05-01 (×3): qty 8

## 2016-05-01 MED ORDER — HEPARIN (PORCINE) IN NACL 100-0.45 UNIT/ML-% IJ SOLN
2150.0000 [IU]/h | INTRAMUSCULAR | Status: DC
  Administered 2016-05-01 – 2016-05-06 (×10): 2000 [IU]/h via INTRAVENOUS
  Administered 2016-05-07: 2150 [IU]/h via INTRAVENOUS
  Filled 2016-05-01 (×23): qty 250

## 2016-05-01 MED ORDER — POTASSIUM CHLORIDE 20 MEQ/15ML (10%) PO SOLN
20.0000 meq | Freq: Once | ORAL | Status: AC
  Administered 2016-05-01: 20 meq
  Filled 2016-05-01: qty 15

## 2016-05-01 MED ORDER — VANCOMYCIN HCL 500 MG IV SOLR
500.0000 mg | Freq: Two times a day (BID) | INTRAVENOUS | Status: DC
  Administered 2016-05-01 – 2016-05-07 (×13): 500 mg via INTRAVENOUS
  Filled 2016-05-01 (×14): qty 500

## 2016-05-01 MED ORDER — SODIUM CHLORIDE 0.9 % IV SOLN
2.0000 g | Freq: Four times a day (QID) | INTRAVENOUS | Status: DC
  Administered 2016-05-01 – 2016-05-03 (×11): 2 g via INTRAVENOUS
  Filled 2016-05-01 (×12): qty 2000

## 2016-05-01 MED ORDER — MAGNESIUM SULFATE 2 GM/50ML IV SOLN
2.0000 g | Freq: Once | INTRAVENOUS | Status: AC
  Administered 2016-05-01: 2 g via INTRAVENOUS
  Filled 2016-05-01: qty 50

## 2016-05-01 MED ORDER — CEFAZOLIN SODIUM-DEXTROSE 2-4 GM/100ML-% IV SOLN
2.0000 g | Freq: Three times a day (TID) | INTRAVENOUS | Status: DC
  Filled 2016-05-01 (×2): qty 100

## 2016-05-01 MED ORDER — WHITE PETROLATUM GEL
Status: AC
  Administered 2016-05-01: 18:00:00
  Filled 2016-05-01: qty 1

## 2016-05-01 NOTE — Progress Notes (Signed)
PHARMACY - PHYSICIAN COMMUNICATION CRITICAL VALUE ALERT - BLOOD CULTURE IDENTIFICATION (BCID)  Results for orders placed or performed during the hospital encounter of 04/15/2016  Blood Culture ID Panel (Reflexed) (Collected: 04/30/2016 12:38 PM)  Result Value Ref Range   Enterococcus species DETECTED (A) NOT DETECTED   Vancomycin resistance NOT DETECTED NOT DETECTED   Listeria monocytogenes NOT DETECTED NOT DETECTED   Staphylococcus species DETECTED (A) NOT DETECTED   Staphylococcus aureus NOT DETECTED NOT DETECTED   Methicillin resistance DETECTED (A) NOT DETECTED   Streptococcus species NOT DETECTED NOT DETECTED   Streptococcus agalactiae NOT DETECTED NOT DETECTED   Streptococcus pneumoniae NOT DETECTED NOT DETECTED   Streptococcus pyogenes NOT DETECTED NOT DETECTED   Acinetobacter baumannii NOT DETECTED NOT DETECTED   Enterobacteriaceae species NOT DETECTED NOT DETECTED   Enterobacter cloacae complex NOT DETECTED NOT DETECTED   Escherichia coli NOT DETECTED NOT DETECTED   Klebsiella oxytoca NOT DETECTED NOT DETECTED   Klebsiella pneumoniae NOT DETECTED NOT DETECTED   Proteus species NOT DETECTED NOT DETECTED   Serratia marcescens NOT DETECTED NOT DETECTED   Haemophilus influenzae NOT DETECTED NOT DETECTED   Neisseria meningitidis NOT DETECTED NOT DETECTED   Pseudomonas aeruginosa NOT DETECTED NOT DETECTED   Candida albicans NOT DETECTED NOT DETECTED   Candida glabrata NOT DETECTED NOT DETECTED   Candida krusei NOT DETECTED NOT DETECTED   Candida parapsilosis NOT DETECTED NOT DETECTED   Candida tropicalis NOT DETECTED NOT DETECTED    Name of physician (or Provider) Contacted: Dr. Daiva EvesVan Dam  Changes to prescribed antibiotics required: d/c ancef and start ampicillin while continuing vancomycin  Arlean Hoppingorey M. Newman PiesBall, PharmD, BCPS Clinical Pharmacist Pager [phone] 05/01/2016  9:40 AM

## 2016-05-01 NOTE — Progress Notes (Signed)
Subjective:  Patient more agitated today  Antibiotics:  Anti-infectives    Start     Dose/Rate Route Frequency Ordered Stop   05/01/16 1000  ampicillin (OMNIPEN) 2 g in sodium chloride 0.9 % 50 mL IVPB     2 g 150 mL/hr over 20 Minutes Intravenous Every 6 hours 05/01/16 0946     05/01/16 0900  ceFAZolin (ANCEF) IVPB 2g/100 mL premix  Status:  Discontinued     2 g 200 mL/hr over 30 Minutes Intravenous Every 8 hours 05/01/16 0835 05/01/16 0844   05/01/16 0900  vancomycin (VANCOCIN) 500 mg in sodium chloride 0.9 % 100 mL IVPB     500 mg 100 mL/hr over 60 Minutes Intravenous Every 12 hours 05/01/16 0835     05/01/16 0900  ceFAZolin (ANCEF) IVPB 2g/100 mL premix  Status:  Discontinued     2 g 200 mL/hr over 30 Minutes Intravenous Every 8 hours 05/01/16 0844 05/01/16 0946   04/26/16 1600  vancomycin (VANCOCIN) 500 mg in sodium chloride 0.9 % 100 mL IVPB     500 mg 100 mL/hr over 60 Minutes Intravenous Every 12 hours 04/26/16 1112 04/27/16 1626   04/26/16 1100  doxycycline (VIBRAMYCIN) 100 mg in dextrose 5 % 250 mL IVPB  Status:  Discontinued     100 mg 125 mL/hr over 120 Minutes Intravenous Every 12 hours 04/26/16 1053 04/26/16 1100   04/25/16 1600  vancomycin (VANCOCIN) 500 mg in sodium chloride 0.9 % 100 mL IVPB  Status:  Discontinued     500 mg 100 mL/hr over 60 Minutes Intravenous Every 12 hours 04/25/16 1558 04/26/16 1053   04/25/16 1230  piperacillin-tazobactam (ZOSYN) IVPB 3.375 g  Status:  Discontinued     3.375 g 12.5 mL/hr over 240 Minutes Intravenous Every 8 hours 04/25/16 1222 04/26/16 1053   04/23/16 0000  ceFEPIme (MAXIPIME) 1 g in dextrose 5 % 50 mL IVPB  Status:  Discontinued     1 g 100 mL/hr over 30 Minutes Intravenous Every 12 hours 04/22/16 1439 04/25/16 1117   04/22/16 1500  vancomycin (VANCOCIN) IVPB 750 mg/150 ml premix  Status:  Discontinued     750 mg 150 mL/hr over 60 Minutes Intravenous Every 12 hours 04/22/16 1439 04/25/16 1550   04/16/2016 1400   piperacillin-tazobactam (ZOSYN) IVPB 2.25 g  Status:  Discontinued     2.25 g 100 mL/hr over 30 Minutes Intravenous Every 6 hours 04/13/2016 0831 05/10/2016 0914   05/10/2016 1200  ceFEPIme (MAXIPIME) 1 g in dextrose 5 % 50 mL IVPB  Status:  Discontinued     1 g 100 mL/hr over 30 Minutes Intravenous Every 24 hours 04/16/2016 0914 04/22/16 1439   05/09/2016 0730  piperacillin-tazobactam (ZOSYN) IVPB 3.375 g     3.375 g 100 mL/hr over 30 Minutes Intravenous  Once 04/26/2016 0723 04/20/2016 0815   04/12/2016 0730  vancomycin (VANCOCIN) IVPB 1000 mg/200 mL premix  Status:  Discontinued     1,000 mg 200 mL/hr over 60 Minutes Intravenous  Once 04/13/2016 0723 04/17/2016 0726   04/10/2016 0730  vancomycin (VANCOCIN) 2,000 mg in sodium chloride 0.9 % 500 mL IVPB     2,000 mg 250 mL/hr over 120 Minutes Intravenous  Once 04/11/2016 0726 04/15/2016 1033      Medications: Scheduled Meds: . ampicillin (OMNIPEN) IV  2 g Intravenous Q6H  . aspirin  81 mg Oral Daily  . chlorhexidine gluconate (MEDLINE KIT)  15 mL Mouth Rinse BID  .  collagenase   Topical Daily  . diltiazem  15 mg Per Tube Q6H  . furosemide  80 mg Intravenous Q12H  . insulin aspart  0-20 Units Subcutaneous Q4H  . insulin aspart  2-6 Units Subcutaneous Q4H  . insulin detemir  5 Units Subcutaneous Daily  . magnesium sulfate 1 - 4 g bolus IVPB  2 g Intravenous Once  . mouth rinse  15 mL Mouth Rinse QID  . pantoprazole sodium  40 mg Per Tube Q1200  . potassium chloride  40 mEq Oral BID  . vancomycin  500 mg Intravenous Q12H   Continuous Infusions: . sodium chloride 10 mL/hr at 04/30/16 0600  . dexmedetomidine 0.7 mcg/kg/hr (05/01/16 0846)  . feeding supplement (VITAL HIGH PROTEIN) 1,000 mL (05/01/16 0857)  . heparin 2,400 Units/hr (05/01/16 0530)   PRN Meds:.acetaminophen (TYLENOL) oral liquid 160 mg/5 mL, albuterol, fentaNYL (SUBLIMAZE) injection, metoprolol, midazolam    Objective: Weight change:   Intake/Output Summary (Last 24 hours) at 05/01/16  1032 Last data filed at 05/01/16 1000  Gross per 24 hour  Intake             1994 ml  Output             4325 ml  Net            -2331 ml   Blood pressure 130/68, pulse 64, temperature (!) 101.5 F (38.6 C), temperature source Core (Comment), resp. rate (!) 22, height _0  (1.778 m), weight 239 lb 3.2 oz (108.5 kg), SpO2 100 %. 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  Right foot amputation site with wound see pic 04/30/16:       Neuro: nonfocal, strength and sensation intact  CBC:  CBC Latest Ref Rng & Units 05/01/2016 04/30/2016 04/29/2016  WBC 4.0 - 10.5 K/uL 11.3(H) 10.7(H) 9.5  Hemoglobin 13.0 - 17.0 g/dL 8.2(L) 7.9(L) 7.6(L)  Hematocrit 39.0 - 52.0 % 27.5(L) 26.5(L) 25.3(L)  Platelets 150 - 400 K/uL 270 272 259      BMET  Recent Labs  04/30/16 1755 05/01/16 0321  NA 147* 147*  K 3.3* 3.4*  CL 114* 111  CO2 27 28  GLUCOSE 242* 220*  BUN 34* 34*  CREATININE 0.98 1.03  CALCIUM 8.2* 8.4*     Liver Panel   Recent Labs  04/29/16 1120 04/30/16 0443 05/01/16 0321  PROT 6.5  --   --   ALBUMIN 1.8* 2.0* 2.1*  AST 52*  --   --   ALT 54  --   --   ALKPHOS 142*  --   --   BILITOT 0.9  --   --   BILIDIR 0.3  --   --   IBILI 0.6  --   --        Sedimentation Rate  Recent Labs  05/01/16 0321  ESRSEDRATE >140*   C-Reactive Protein  Recent Labs  05/01/16 0321  CRP 4.2*    Micro Results: Recent Results (from the past 720 hour(s))  Blood Culture (routine x 2)     Status: None  Collection Time:  04/20/2016  7:30 AM  Result Value Ref Range Status   Specimen Description BLOOD LEFT ANTECUBITAL  Final   Special Requests BOTTLES DRAWN AEROBIC AND ANAEROBIC 5CC  Final   Culture NO GROWTH 5 DAYS  Final   Report Status 04/26/2016 FINAL  Final  Blood Culture (routine x 2)     Status: Abnormal   Collection Time: 05/05/2016  7:35 AM  Result Value Ref Range Status   Specimen Description BLOOD RIGHT HAND  Final   Special Requests BOTTLES DRAWN AEROBIC ONLY 5CC  Final   Culture  Setup Time   Final    GRAM POSITIVE COCCI IN CLUSTERS AEROBIC BOTTLE ONLY Organism ID to follow CRITICAL RESULT CALLED TO, READ BACK BY AND VERIFIED WITH: J. CARNEY, PHARM AT 0740 ON [id number] BY Rhea Bleacher    Culture (A)  Final    STAPHYLOCOCCUS SPECIES (COAGULASE NEGATIVE) THE SIGNIFICANCE OF ISOLATING THIS ORGANISM FROM A SINGLE SET OF BLOOD CULTURES WHEN MULTIPLE SETS ARE DRAWN IS UNCERTAIN. PLEASE NOTIFY THE MICROBIOLOGY DEPARTMENT WITHIN ONE WEEK IF SPECIATION AND SENSITIVITIES ARE REQUIRED.    Report Status 04/24/2016 FINAL  Final  Blood Culture ID Panel (Reflexed)     Status: Abnormal   Collection Time: 04/24/2016  7:35 AM  Result Value Ref Range Status   Enterococcus species NOT DETECTED NOT DETECTED Final   Listeria monocytogenes NOT DETECTED NOT DETECTED Final   Staphylococcus species DETECTED (A) NOT DETECTED Final    Comment: CRITICAL RESULT CALLED TO, READ BACK BY AND VERIFIED WITH: J. CARNEY, [place] [address] BY S. YARBROUGH    Staphylococcus aureus NOT DETECTED NOT DETECTED Final   Methicillin resistance DETECTED (A) NOT DETECTED Final    Comment: CRITICAL RESULT CALLED TO, READ BACK BY AND VERIFIED WITH: J. CARNEY, [place] [address] BY S. YARBROUGH    Streptococcus species NOT DETECTED NOT DETECTED Final   Streptococcus agalactiae NOT DETECTED NOT DETECTED Final   Streptococcus pneumoniae NOT DETECTED NOT DETECTED Final   Streptococcus pyogenes NOT DETECTED NOT DETECTED Final    Acinetobacter baumannii NOT DETECTED NOT DETECTED Final   Enterobacteriaceae species NOT DETECTED NOT DETECTED Final   Enterobacter cloacae complex NOT DETECTED NOT DETECTED Final   Escherichia coli NOT DETECTED NOT DETECTED Final   Klebsiella oxytoca NOT DETECTED NOT DETECTED Final   Klebsiella pneumoniae NOT DETECTED NOT DETECTED Final   Proteus species NOT DETECTED NOT DETECTED Final   Serratia marcescens NOT DETECTED NOT DETECTED Final   Haemophilus influenzae NOT DETECTED NOT DETECTED Final   Neisseria meningitidis NOT DETECTED NOT DETECTED Final   Pseudomonas aeruginosa NOT DETECTED NOT DETECTED Final   Candida albicans NOT DETECTED NOT DETECTED Final   Candida glabrata NOT DETECTED NOT DETECTED Final   Candida krusei NOT DETECTED NOT DETECTED Final   Candida parapsilosis NOT DETECTED NOT DETECTED Final   Candida tropicalis NOT DETECTED NOT DETECTED Final  Urine culture     Status: None   Collection Time: 05/02/2016  8:41 AM  Result Value Ref Range Status   Specimen Description URINE, CATHETERIZED  Final   Special Requests NONE  Final   Culture NO GROWTH  Final   Report Status 04/22/2016 FINAL  Final  MRSA PCR Screening     Status: None   Collection Time: 04/29/2016 12:20 PM  Result Value Ref Range Status   MRSA by PCR NEGATIVE NEGATIVE Final    Comment:        The GeneXpert MRSA Assay (  FDA approved for NASAL specimens only), is one component of a comprehensive MRSA colonization surveillance program. It is not intended to diagnose MRSA infection nor to guide or monitor treatment for MRSA infections.   Culture, respiratory (NON-Expectorated)     Status: None   Collection Time: 04/22/16 12:18 PM  Result Value Ref Range Status   Specimen Description TRACHEAL ASPIRATE  Final   Special Requests Normal  Final   Gram Stain   Final    MODERATE WBC PRESENT, PREDOMINANTLY PMN FEW GRAM POSITIVE COCCI IN PAIRS IN SINGLES    Culture   Final    MODERATE METHICILLIN RESISTANT  STAPHYLOCOCCUS AUREUS   Report Status 04/25/2016 FINAL  Final   Organism ID, Bacteria METHICILLIN RESISTANT STAPHYLOCOCCUS AUREUS  Final      Susceptibility   Methicillin resistant staphylococcus aureus - MIC*    CIPROFLOXACIN >=8 RESISTANT Resistant     ERYTHROMYCIN >=8 RESISTANT Resistant     GENTAMICIN <=0.5 SENSITIVE Sensitive     OXACILLIN >=4 RESISTANT Resistant     TETRACYCLINE <=1 SENSITIVE Sensitive     VANCOMYCIN 1 SENSITIVE Sensitive     TRIMETH/SULFA <=10 SENSITIVE Sensitive     CLINDAMYCIN >=8 RESISTANT Resistant     RIFAMPIN <=0.5 SENSITIVE Sensitive     Inducible Clindamycin NEGATIVE Sensitive     * MODERATE METHICILLIN RESISTANT STAPHYLOCOCCUS AUREUS  Respiratory Panel by PCR     Status: None   Collection Time: 04/22/16  1:11 PM  Result Value Ref Range Status   Adenovirus NOT DETECTED NOT DETECTED Final   Coronavirus 229E NOT DETECTED NOT DETECTED Final   Coronavirus HKU1 NOT DETECTED NOT DETECTED Final   Coronavirus NL63 NOT DETECTED NOT DETECTED Final   Coronavirus OC43 NOT DETECTED NOT DETECTED Final   Metapneumovirus NOT DETECTED NOT DETECTED Final   Rhinovirus / Enterovirus NOT DETECTED NOT DETECTED Final   Influenza A NOT DETECTED NOT DETECTED Final   Influenza B NOT DETECTED NOT DETECTED Final   Parainfluenza Virus 1 NOT DETECTED NOT DETECTED Final   Parainfluenza Virus 2 NOT DETECTED NOT DETECTED Final   Parainfluenza Virus 3 NOT DETECTED NOT DETECTED Final   Parainfluenza Virus 4 NOT DETECTED NOT DETECTED Final   Respiratory Syncytial Virus NOT DETECTED NOT DETECTED Final   Bordetella pertussis NOT DETECTED NOT DETECTED Final   Chlamydophila pneumoniae NOT DETECTED NOT DETECTED Final   Mycoplasma pneumoniae NOT DETECTED NOT DETECTED Final  C difficile quick scan w PCR reflex     Status: None   Collection Time: 04/25/16 10:56 PM  Result Value Ref Range Status   C Diff antigen NEGATIVE NEGATIVE Final   C Diff toxin NEGATIVE NEGATIVE Final   C Diff  interpretation No C. difficile detected.  Final  Culture, respiratory (NON-Expectorated)     Status: None (Preliminary result)   Collection Time: 04/30/16  9:10 AM  Result Value Ref Range Status   Specimen Description TRACHEAL ASPIRATE  Final   Special Requests NONE  Final   Gram Stain   Final    ABUNDANT WBC PRESENT,BOTH PMN AND MONONUCLEAR ABUNDANT GRAM POSITIVE COCCI IN CLUSTERS    Culture ABUNDANT STAPHYLOCOCCUS AUREUS  Final   Report Status PENDING  Incomplete  Culture, blood (Routine X 2) w Reflex to ID Panel     Status: None (Preliminary result)   Collection Time: 04/30/16 12:38 PM  Result Value Ref Range Status   Specimen Description BLOOD RIGHT HAND  Final   Special Requests IN PEDIATRIC BOTTLE 4CC  Final  Culture  Setup Time   Final    GRAM POSITIVE COCCI IN CLUSTERS IN PEDIATRIC BOTTLE CRITICAL RESULT CALLED TO, READ BACK BY AND VERIFIED WITH: K AMEND,PHARMD AT 0720 05/01/16 BY L BENFIELD CRITICAL RESULT CALLED TO, READ BACK BY AND VERIFIED WITH: C BALL,PHARMD AT 0935 05/01/16 BY L BENFIELD BCID REQUESTED REPEAT BY DR VAN DAMM    Culture GRAM POSITIVE COCCI  Final   Report Status PENDING  Incomplete  Blood Culture ID Panel (Reflexed)     Status: Abnormal   Collection Time: 04/30/16 12:38 PM  Result Value Ref Range Status   Enterococcus species DETECTED (A) NOT DETECTED Final    Comment: CRITICAL RESULT CALLED TO, READ BACK BY AND VERIFIED WITH: C BALL,PHARMD AT 0935 05/01/16 BY L BENFIELD    Vancomycin resistance NOT DETECTED NOT DETECTED Final   Listeria monocytogenes NOT DETECTED NOT DETECTED Final   Staphylococcus species DETECTED (A) NOT DETECTED Final    Comment: CRITICAL RESULT CALLED TO, READ BACK BY AND VERIFIED WITH: C BALL,PHARMD AT 0935 05/01/16 BY L BENFIELD    Staphylococcus aureus NOT DETECTED NOT DETECTED Final   Methicillin resistance DETECTED (A) NOT DETECTED Final    Comment: CRITICAL RESULT CALLED TO, READ BACK BY AND VERIFIED WITH: C  BALL,PHARMD AT 0935 05/01/16 BY L BENFIELD    Streptococcus species NOT DETECTED NOT DETECTED Final   Streptococcus agalactiae NOT DETECTED NOT DETECTED Final   Streptococcus pneumoniae NOT DETECTED NOT DETECTED Final   Streptococcus pyogenes NOT DETECTED NOT DETECTED Final   Acinetobacter baumannii NOT DETECTED NOT DETECTED Final   Enterobacteriaceae species NOT DETECTED NOT DETECTED Final   Enterobacter cloacae complex NOT DETECTED NOT DETECTED Final   Escherichia coli NOT DETECTED NOT DETECTED Final   Klebsiella oxytoca NOT DETECTED NOT DETECTED Final   Klebsiella pneumoniae NOT DETECTED NOT DETECTED Final   Proteus species NOT DETECTED NOT DETECTED Final   Serratia marcescens NOT DETECTED NOT DETECTED Final   Haemophilus influenzae NOT DETECTED NOT DETECTED Final   Neisseria meningitidis NOT DETECTED NOT DETECTED Final   Pseudomonas aeruginosa NOT DETECTED NOT DETECTED Final   Candida albicans NOT DETECTED NOT DETECTED Final   Candida glabrata NOT DETECTED NOT DETECTED Final   Candida krusei NOT DETECTED NOT DETECTED Final   Candida parapsilosis NOT DETECTED NOT DETECTED Final   Candida tropicalis NOT DETECTED NOT DETECTED Final    Studies/Results: Dg Chest Port 1 View  Result Date: 05/01/2016 CLINICAL DATA:  Shortness of Breath EXAM: PORTABLE CHEST 1 VIEW COMPARISON:  04/30/2016 FINDINGS: Cardiac shadow is stable. Postoperative changes are again seen. A right-sided PICC line and endotracheal tube are again noted and stable. The lungs are well-aerated without focal infiltrate. The previously seen right basilar atelectasis has resolved in the interval. IMPRESSION: No acute abnormality noted. Electronically Signed   By: Inez Catalina M.D.   On: 05/01/2016 08:56   Dg Chest Port 1 View  Result Date: 04/30/2016 CLINICAL DATA:  Status post intubation EXAM: PORTABLE CHEST 1 VIEW COMPARISON:  04/29/2016 FINDINGS: Cardiac shadow is mildly enlarged. Postsurgical changes are again seen.  Endotracheal tube is again noted in satisfactory position but just above the carina. Right PICC line is seen in the right innominate vein and stable. The lungs are well aerated bilaterally. Very minimal right basilar atelectasis is seen. IMPRESSION: No significant interval change from the previous day. Persistent right basilar atelectasis is noted. Electronically Signed   By: Inez Catalina M.D.   On: 04/30/2016 07:30   Dg  Foot 2 Views Right  Result Date: 04/30/2016 CLINICAL DATA:  Diabetic foot ulcer EXAM: RIGHT FOOT - 2 VIEW COMPARISON:  None. FINDINGS: Two views of the right foot submitted. No acute fracture or subluxation. The patient is status post amputation of metatarsals and toes. Tiny plantar spur of calcaneus. Mild dorsal spurring tarsal region. Mild degenerative changes tibiotalar joint. No definite evidence of bone destruction to suggest osteomyelitis. Atherosclerotic vascular calcifications are noted. IMPRESSION: No acute fracture or subluxation. The patient is status post amputation of metatarsals and toes. Tiny plantar spur of calcaneus. Mild dorsal spurring tarsal region. Mild degenerative changes tibiotalar joint. No definite evidence of bone destruction to suggest osteomyelitis. Atherosclerotic vascular calcifications are noted. Electronically Signed   By: Lahoma Crocker M.D.   On: 04/30/2016 18:05      Assessment/Plan:  INTERVAL HISTORY:   1/2 Blood cx + enterococcus (not VANCO R) and MR  Coag Neg Staph  Resp culture abundant Staph Aureus  Xray foot not revealing for infection   Active Problems:   Sepsis ([place])   Acute respiratory failure with hypoxia ([facility])   Endotracheally intubated   History of ETT   Hyperkalemia   RUQ pain   Transaminitis   FUO (fever of unknown origin)    Clayton [name] is a 60 y.o. male with  esident with hx CHF, CAD s/p remote stent, DM, HTN, Aflutter s/p ablation WITH PACEMAKER  on coumadin, severe PVD s/p R BKA, being treated for? infection at SNF  with rocephin. Presented 10/12 from SNF after being found unresponsive with agonal breaths sp broad-spectrum antibiotics of vancomycin and cefepime narrowed to vancomycin after MRSA isolated from his resp cultures still on ventilator and still some low-grade temperatures. He is now growing an Enterococcus and MR Coag neg staph from 1/2 cultures taken yesterday  #1 FUO: perhaps due to a true bacteremia or difficult to resolve PNA. However with 1/2 and mixed culture I am skeptical that the bacteremia is real. Also his CXR is improving  Foot could be a source and could get CT with contrast (cannot get MRI unless device compatible with MRI) but would hold off for now  Changed to Vancomycin and AMP pending sensis on the enterococcus  If he grows the Enterococcus or the Coag Neg Staph in 2/2 would get a TEE given ICD and concern for endocarditis  Can we get records from SNF. All that is in the chart is that he was on rocephin through the 12th per the [facility]  #2 Bacteremia: see above  #3 DFU: see above. Considering CT w contrast but want to get clarity on his bacteremia if it is real or not and hopefully get him of the ventilator so we can talk to him and get more records  Dr. Baxter Flattery is back tomorrow.   LOS: 10 days   Alcide Evener 05/01/2016, 10:32 AM

## 2016-05-01 NOTE — Progress Notes (Signed)
ANTICOAGULATION CONSULT NOTE  Pharmacy Consult for Heparin Indication: atrial fibrillation  Allergies  Allergen Reactions  . Niaspan [Niacin Er] Hives and Other (See Comments)    Reaction:  Facial redness/hotness     Patient Measurements: Height: 5\' 10"  (177.8 cm) Weight: 239 lb 3.2 oz (108.5 kg) IBW/kg (Calculated) : 73  Heparin Wt: 97 kg  Vital Signs: Temp: 101.5 F (38.6 C) (10/21 1900) Temp Source: Core (Comment) (10/21 1800) BP: 105/58 (10/22 0326) Pulse Rate: 70 (10/22 0326)  Labs:  Recent Labs  04/29/16 0500 04/29/16 1120 04/29/16 1330  04/30/16 0435 04/30/16 0443 04/30/16 1755 05/01/16 0321  HGB 7.6*  --   --   --   --  7.9*  --  8.2*  HCT 25.3*  --   --   --   --  26.5*  --  27.5*  PLT 259  --   --   --   --  272  --  270  HEPARINUNFRC 0.29*  --  0.42  --  0.44  --   --  0.96*  CREATININE 1.01  --   --   < >  --  1.04 0.98 1.03  CKTOTAL  --  136  --   --   --   --   --   --   < > = values in this interval not displayed.  Estimated Creatinine Clearance: 91.7 mL/min (by C-G formula based on SCr of 1.03 mg/dL).  Assessment: [age] yoM with atrial flutter on warfarin PTA now on heparin per pharmacy dosing. Abdominal US negative for a bleed. Heparin level now supratherapeutic (0.96) on gtt at 2600 units/hr. No issues with bleeding reported per RN. Confirmed that level drawn appropriately.  Goal of Therapy: Heparin level 0.3 - 0.7 units/mL Monitor platelets by anticoagulation protocol: Yes  Plan:  Decrease heparin back to 2400 units/hr F/u 6 hr heparin level  Christoper Fabianaron Kimbley Sprague, PharmD, BCPS Clinical pharmacist, pager [phone] 05/01/2016 4:35 AM

## 2016-05-01 NOTE — Progress Notes (Signed)
PULMONARY / CRITICAL CARE MEDICINE   Name: Clayton [name] MRN: [id number] DOB: April 07, 1956    ADMISSION DATE:  04/16/2016  REFERRING MD:  Rubin Payor (EDP)   CHIEF COMPLAINT:  Sepsis, respiratory failure   brief 60yo male SNF resident with hx CHF, CAD s/p remote stent, DM, HTN, Aflutter s/p ablation on coumadin, severe PVD s/p R BKA, being treated for unknown infection at SNF with rocephin.  Presented 10/12 from SNF after being found unresponsive with agonal breaths.  Bagged by EMS en route and intubated on arrival to ER.  In ER was febrile to 104, hypotensive with SBP 70's.  Labs notable for AKI with K 7.1, glucose 500, WBC 36, lactate 3.6.  PCCM consulted for admit.     STUDIES:  Renal U/S 10/12:  No abnormality or hydronephrosis.  TTE 10/13:  Difficult study. Mild LVH w/ EF 55%. Possible akinesis apical myocardium without obvious thrombus.Moderate aortic stenosis w/ mild mitral stenosis. RV poorly visualized. CT abdomen 10/15: no evidence for retroperitoneal hemorrhage. RLL central airspace disease compatible with aspiration and/or PNA CXR: 10/17: unchanged from 10/16. No consolidation. Final read pending.  RUQ [place] 10/18: Cholelithiasis without sonographic evidence acute cholecystitis. A hepatobiliary scintigraphy may provide better evaluation of the gallbladder if an acute cholecystitis is clinically suspected. Fatty Liver.  CXR 10/19: No pulmonary edema. Stable endotracheal tube position. Hazy right infrahilar and right base atelectasis or infiltrate CXR 10/20: pending  MICROBIOLOGY: MRSA PCR 10/12:  Negative Blood Ctx x2 10/12 >> 1/2 Coag Neg Staph; 1/2 NG x 5 days  Urine Ctx 10/12:  Negative  Tracheal Asp Ctx 10/13 >> MRSA Respiratory Viral Panel PCR 10/13:  Negative  C dif 10/6 - negaive ....... Blood 10/20 - GPC in 1 of 2. BIOFIRE Pending Urine 10/20 Trach 10/20 ID consult 10/21 >> ....................Marland Kitchen      ANTIBIOTICS: Zosyn 10/12 x1 dose; 10/16 >>10/17 Vancomycin  10/12 >>10/18 Cefepime 10/12 >> 10/16  LINES/TUBES: OETT 7.5 10/12 >>  RUE PICC (SNF) 10/11 >>   Foley 10/12 >> PEG >> PIV x1   SIGNIFICANT EVENTS: 10/12 - Admit & intubation in ED 10/14 - switched from Levo to Neo due to intermittent tachycardia  10/15 - off pressor 10/17 - Failed wean 10/18 - failed wean started lasix  For 20L + bal sinc admit 10/19 - Failed wean  04/29/16 -  No events overnight. Patient had continued low grade fevers up to 100.50F and 101.1 this AM. Vitals otherwise stable overnight.  Nursing mentioned of some elevated CBGs; patient is on tube feeds. We are diuresing patient and he had 4.8 L out overnight; still net positive of 15L from 17L. Potassium 2.7 this AM; repletion ordered.  04/30/16 - diuresing. Now down to 12L +ve bal.Still febrile. Failed SBT yesterday and right now to RR 50s.Seems more awake    SUBJECTIVE/OVERNIGHT/INTERVAL HX 05/01/16 - Diuresing - down to 10L positive, cxr better. Stil febrile to 101F -> ID following (might need TEE has pacer, MRI foot ideal bu thas pacer). Failed sBT again per RN  . WUA - on less precedex - > gets agitated    VITAL SIGNS: BP 130/68 (BP Location: Left Arm)   Pulse 64   Temp (!) 101.5 F (38.6 C) (Core (Comment))   Resp (!) 22   Ht 5\' 10"  (1.778 m)   Wt 108.5 kg (239 lb 3.2 oz)   SpO2 100%   BMI 34.32 kg/m   HEMODYNAMICS:    VENTILATOR SETTINGS: Vent Mode: PRVC FiO2 (%):  [  40 %] 40 % Set Rate:  [22 bmp] 22 bmp Vt Set:  [580 mL] 580 mL PEEP:  [5 cmH20] 5 cmH20 Plateau Pressure:  [18 cmH20-23 cmH20] 22 cmH20  INTAKE / OUTPUT: I/O last 3 completed shifts: In: 3039 [I.V.:1939; NG/GT:1050; IV Piggyback:50] Out: 7990 [Urine:7940; Stool:50]  PHYSICAL EXAMINATION:  General:  NAD  Neuro:  On precedex - nodded yes/no. Agitatedon less precedex HEENT:  ETT in place. No scleral icterus or injection. Cardiovascular: regular rhythm. No appreciable JVD. Lungs: coarse breath sounds. Symmetric chest rise on  ventilator.  Abdomen:  Somewhat hypoactive bowel sounds, nontender to palpation ,  Flexi-Seal rectal tube in place for stool. Musculoskeletal:  Warm and dry. 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 BAck - sacaral decub -pre-admit +  LABS:  PULMONARY No results for input(s): PHART, PCO2ART, PO2ART, HCO3, TCO2, O2SAT in the last 168 hours.  Invalid input(s): PCO2, PO2  CBC  Recent Labs Lab 04/29/16 0500 04/30/16 0443 05/01/16 0321  HGB 7.6* 7.9* 8.2*  HCT 25.3* 26.5* 27.5*  WBC 9.5 10.7* 11.3*  PLT 259 272 270    COAGULATION No results for input(s): INR in the last 168 hours.  CARDIAC  No results for input(s): TROPONINI in the last 168 hours. No results for input(s): PROBNP in the last 168 hours.   CHEMISTRY  Recent Labs Lab 04/27/16 0456 04/27/16 0457 04/28/16 0430 04/29/16 0500 04/29/16 1500 04/30/16 0443 04/30/16 1755 05/01/16 0321  NA  --  145 148* 143 147* 146* 147* 147*  K  --  3.4* 4.0 2.7* 3.9 3.1* 3.3* 3.4*  CL  --  111 117* 111 116* 112* 114* 111  CO2  --  24 23 25 25 26 27 28   GLUCOSE  --  101* 147* 261* 171* 234* 242* 220*  BUN  --  65* 47* 44* 40* 38* 34* 34*  CREATININE  --  1.03 1.02 1.01 1.05 1.04 0.98 1.03  CALCIUM  --  8.2* 8.3* 7.9* 8.0* 8.1* 8.2* 8.4*  MG 2.0  --  2.0 1.6*  --  1.8  --  1.9  PHOS  --  3.4 3.0 3.0  --  3.4  3.4  --  3.5  3.6   Estimated Creatinine Clearance: 91.7 mL/min (by C-G formula based on SCr of 1.03 mg/dL).   LIVER  Recent Labs Lab 04/27/16 0842 04/28/16 0430 04/29/16 0500 04/29/16 1120 04/30/16 0443 05/01/16 0321  AST 80*  --   --  52*  --   --   ALT 58  --   --  54  --   --   ALKPHOS 170*  --   --  142*  --   --   BILITOT 1.1  --   --  0.9  --   --   PROT 6.0*  --   --  6.5  --   --   ALBUMIN 1.7* 1.8* 1.8* 1.8* 2.0* 2.1*     INFECTIOUS  Recent Labs Lab 04/29/16 1000 04/29/16 1120 05/01/16 0321  LATICACIDVEN 1.8  --   --   PROCALCITON  --  0.71 0.34     ENDOCRINE CBG  (last 3)   Recent Labs  05/01/16 0321 05/01/16 0348 05/01/16 0745  GLUCAP 202* 223* 196*         IMAGING x48h  - image(s) personally visualized  -   highlighted in bold Dg Chest Port 1 View  Result Date: 04/30/2016 CLINICAL DATA:  Status post intubation EXAM: PORTABLE CHEST 1 VIEW  COMPARISON:  04/29/2016 FINDINGS: Cardiac shadow is mildly enlarged. Postsurgical changes are again seen. Endotracheal tube is again noted in satisfactory position but just above the carina. Right PICC line is seen in the right innominate vein and stable. The lungs are well aerated bilaterally. Very minimal right basilar atelectasis is seen. IMPRESSION: No significant interval change from the previous day. Persistent right basilar atelectasis is noted. Electronically Signed   By: Alcide CleverMark  Lukens M.D.   On: 04/30/2016 07:30   Dg Foot 2 Views Right  Result Date: 04/30/2016 CLINICAL DATA:  Diabetic foot ulcer EXAM: RIGHT FOOT - 2 VIEW COMPARISON:  None. FINDINGS: Two views of the right foot submitted. No acute fracture or subluxation. The patient is status post amputation of metatarsals and toes. Tiny plantar spur of calcaneus. Mild dorsal spurring tarsal region. Mild degenerative changes tibiotalar joint. No definite evidence of bone destruction to suggest osteomyelitis. Atherosclerotic vascular calcifications are noted. IMPRESSION: No acute fracture or subluxation. The patient is status post amputation of metatarsals and toes. Tiny plantar spur of calcaneus. Mild dorsal spurring tarsal region. Mild degenerative changes tibiotalar joint. No definite evidence of bone destruction to suggest osteomyelitis. Atherosclerotic vascular calcifications are noted. Electronically Signed   By: Natasha MeadLiviu  Pop M.D.   On: 04/30/2016 18:05       ASSESSMENT / PLAN:  Mr. Clayton [name] is a  60 y.o. male with PMH CHF, CAD s/p remote stent, DM, HTN, Aflutter s/p ablation on anticoagulation, severe PVD s/p R BKA. Presenting with septic shock  likely from MRSA PNA. Is on Heparin for history of a flutter.  Attempted SBT 10/18 and 10/19 but did not pass; Possibly fluid overloaded therefore diuresing  Lasix.   INFECTIOUS A: Severe Sepsis, improving  RLL MRSA PNA: tracheal aspirate culture with MRSA  04/22/16  S/p Vancomycin for 7 days  S/p Cefepime and Zosyn  Current 05/01/2016 = still febrile  P Monitor off abx Pan culture 04/30/16 - pending ID consult - d/w Dr Synthia InnocentVan Damm - will need TEE and/or CT foot at some point    PULMONARY A: Acute Hypoxic Respiratory Failure - RLL central airspace disease compatible with aspiration/PNA on Imaging (CT). Treated with abx.   - failed sBT . CXR improved. - still 10L +ve balance + fever  P:   Full Vent Support  Daily PS Wean & SBT Intermittent CXR & ABG Albuterol nebs prn [place] lasix but reduce dose   CARDIOVASCULAR A: #baseline H/O CAD w/ Stent H/O CHF - Dilated LV w/ decreased function November 2016. H/O A Flutter - S/P ablation   #since admission Shock - Likely septic. Resolved and off presors.  Elevated Troponin I - Likely demand ischemia. Echo was difficult to interpret. Questionable apical akinesis. Aortic Stenosis - Seen on poor quality TTE. SVT - Questionable Atrial flutter, HR within normal limits  - 10/22 - maintains bp/hr. On heparin for A Fib, ? NSvtach    P:  Cardizem 15mg  q 6 hours  Heparin drip  Continuous telemetry monitoring Vitals per unit protocol Goal MAP >65 & SBP >90 ASA 81mg  VT daily Lasix   RENAL A: Acute Renal Insuff - resolved 04/26/16   - mild low mag and low K on 05/01/16 but getting hypernatrremic and pre-renal    P:   Monitor with lasix but reduce Replete mag and k esp due to reported  Ns vtach  GASTROINTESTINAL A: Loose stools: c diff negative  RUQ tenderness : RUQ US with cholelithiasis without sonographic evidence of cholecystitis. Tenderness resolved  10/20  P:   NPO  Tube feeds  Protonix VT daily    HEMATOLOGIC   A: Anemia - stable hgb. S/p 1 unit pRBC 10/16. Hgb stable this AM. No signs of active bleeding . No RP bleed 04/24/16 CT   P:  Trending cell counts daily w/ CBC Trending Hgb/Hct q12hr Transfuse for Hgb <7.0 Heparin Drip   ENDOCRINE A: H/O DM - A1c 10.6 in June 2017. CBGs in 200s in the setting of tube feeds   P:   SSI per Resistant Algorithm Start Scheduled Novolog 4 units q 4 hrs (with tube feeds) Start Levemir 5 units  Accu-Checks q4hr  NEUROLOGIC A: Acute Encephalopathy - Multifactorial from hypoxia and probable toxic metabolic. Sedation on Ventilator    - acute  Encephalopathy definitely a barrier to extubation  P:   RASS goal: 0 to -1 Precedex gtt  Versed IV prn sedation   INTEGUMENT:  A: Sacral Pressure Injury  P: - wound care: santyl with saline moistened gauze daily covered with dry gauze    GLOBAL Fails sbt. Continue lasix. Address ongoing fever- ID consult   FAMILY None at bedside 05/01/2016   The patient is critically ill with multiple organ systems failure and requires high complexity decision making for assessment and support, frequent evaluation and titration of therapies, application of advanced monitoring technologies and extensive interpretation of multiple databases.   Critical Care Time devoted to patient care services described in this note is  30  Minutes. This time reflects time of care of this signee Dr Kalman Shan. This critical care time does not reflect procedure time, or teaching time or supervisory time of PA/NP/Med student/Med Resident etc but could involve care discussion time    Dr. Kalman Shan, M.D., [facility].C.P Pulmonary and Critical Care Medicine Staff Physician [facility] [place] [facility] Pager: [phone], If no answer or between  15:00h - 7:00h: call [phone]  05/01/2016 8:40 AM

## 2016-05-01 NOTE — Consult Note (Signed)
WOC Nurse wound consult note Reason for Consult:right lateral foot full thickness wound (chronic, non-healing) Wound type: neuropathic Pressure Ulcer POA: No Measurement: 1.5cm z 1cm x 0.2cm in a previously healed area measuring 7cm x 3cm Wound WUJ:WJXBbed:Pink, moist, non-granulating Drainage (amount, consistency, odor) scant serous. Periwound: Ring of dried callous removed from 75% of periphery, 25% of callous ring remains firmly adherent. Dressing procedure/placement/frequency: I will implement a daily white petrolatum dressing to foster a therapeutically moist environment that will simultaneous soften the peripheral callous. A pressure redistribution boot will also correct lateral rotation of foot and perhaps expedite healing while preventing pressure from delaying wound/tissue repair. WOC nursing team will not follow, but will remain available to this patient, the nursing and medical teams.  Please re-consult if needed. Thanks, Ladona MowLaurie Addilynn Mowrer, MSN, RN, GNP, Hans EdenCWOCN, CWON-AP, FAAN  Pager# [phone]

## 2016-05-01 NOTE — Progress Notes (Signed)
ANTICOAGULATION CONSULT NOTE  Pharmacy Consult for Heparin Indication: atrial fibrillation  Allergies  Allergen Reactions  . Niaspan [Niacin Er] Hives and Other (See Comments)    Reaction:  Facial redness/hotness     Patient Measurements: Height: 5\' 10"  (177.8 cm) Weight: 239 lb 3.2 oz (108.5 kg) IBW/kg (Calculated) : 73  Heparin Wt: 97 kg  Vital Signs: Temp: 101.5 F (38.6 C) (10/22 0800) Temp Source: Core (Comment) (10/22 0800) BP: 153/73 (10/22 1129) Pulse Rate: 79 (10/22 1129)  Labs:  Recent Labs  04/29/16 0500 04/29/16 1120  04/30/16 0435 04/30/16 0443 04/30/16 1755 05/01/16 0321 05/01/16 1140  HGB 7.6*  --   --   --  7.9*  --  8.2*  --   HCT 25.3*  --   --   --  26.5*  --  27.5*  --   PLT 259  --   --   --  272  --  270  --   HEPARINUNFRC 0.29*  --   < > 0.44  --   --  0.96* 0.82*  CREATININE 1.01  --   < >  --  1.04 0.98 1.03  --   CKTOTAL  --  136  --   --   --   --   --   --   < > = values in this interval not displayed.  Estimated Creatinine Clearance: 91.7 mL/min (by C-G formula based on SCr of 1.03 mg/dL).  Assessment: [age] yoM with atrial flutter on warfarin PTA now on heparin per pharmacy dosing. Abdominal US negative for a bleed. Heparin level now supratherapeutic (0.96) on gtt at 2600 units/hr. No issues with bleeding reported per RN. Confirmed that level drawn appropriately.  Repeat heparin level remains elevated but trending down to 0.82 on heparin 2400 units/hr. Nurse reports no issues with infusion or bleeding.  Goal of Therapy: Heparin level 0.3 - 0.7 units/mL Monitor platelets by anticoagulation protocol: Yes  Plan:  Decrease heparin to 2200 units/hr 6h HL Daily HL/CBC  Arlean Hoppingorey M. Newman PiesBall, PharmD, BCPS Clinical Pharmacist Pager [phone] 05/01/2016 12:44 PM

## 2016-05-01 NOTE — Progress Notes (Signed)
Pharmacy Antibiotic Note  Clayton [name] is a 60 y.o. male known to pharmacy from previous abx consults and we are currently managing his heparin therapy.  Micro lab called to report that 1/2 bcx with GPC in clusters.  Per Dr. Daiva EvesVan Dam, to resume vancomycin and start ancef for suspected bacteremia.  Micro lab will also perform BCID on GPC per Dr. Clinton GallantVan Dam's request.   Plan: - ancef 2 gm IV q8h - vancomycin 500 mg IV q12h  - f/u cultures  _______________________________  Height: 5\' 10"  (177.8 cm) Weight: 239 lb 3.2 oz (108.5 kg) IBW/kg (Calculated) : 73  Temp (24hrs), Avg:101 F (38.3 C), Min:99.9 F (37.7 C), Max:101.7 F (38.7 C)   Recent Labs Lab 04/25/16 1430  04/27/16 0456  04/28/16 0430 04/29/16 0500 04/29/16 1000 04/29/16 1500 04/30/16 0443 04/30/16 1755 05/01/16 0321  WBC  --   < > 10.7*  --  7.6 9.5  --   --  10.7*  --  11.3*  CREATININE  --   < >  --   < > 1.02 1.01  --  1.05 1.04 0.98 1.03  LATICACIDVEN  --   --   --   --   --   --  1.8  --   --   --   --   VANCOTROUGH 23*  --   --   --   --   --   --   --   --   --   --   < > = values in this interval not displayed.  Estimated Creatinine Clearance: 91.7 mL/min (by C-G formula based on SCr of 1.03 mg/dL).    Allergies  Allergen Reactions  . Niaspan [Niacin Er] Hives and Other (See Comments)    Reaction:  Facial redness/hotness     Antimicrobials this admission: Zosyn 10/12 x1, 1016 >>10/17 Vanc 10/12>>10/18>> resume 10/22>> Cefepime 10/12>>10/15 Ancef 10/22>>  Dose adjustments this admission: VT 10/16 = 23 on 750mg  IV every 12h >>changed to 500 q12h  Microbiology results: 10/13 BCID: +staph spp, methicillin-resistance  10/12 BCx: 1/2 CNS 10/12 UCx: ngtd 10/12 MRSA PCR: neg 10/13 Resp panel: neg 10/13 Sputum: MRSA 10/16: C. Diff negative 10/21 bcx x2: 1/2 GPC in clusters (micro to repeat BCID per Dr. Daiva EvesVan Dam)  Thank you for allowing pharmacy to be a part of this patient's care.  Lucia Gaskinsham, Alizia Greif  P 05/01/2016 8:17 AM

## 2016-05-01 NOTE — Progress Notes (Signed)
ANTICOAGULATION CONSULT NOTE  Pharmacy Consult for Heparin Indication: atrial fibrillation  Allergies  Allergen Reactions  . Niaspan [Niacin Er] Hives and Other (See Comments)    Reaction:  Facial redness/hotness     Patient Measurements: Height: 5\' 10"  (177.8 cm) Weight: 239 lb 3.2 oz (108.5 kg) IBW/kg (Calculated) : 73  Heparin Wt: 97 kg  Vital Signs: Temp: 101.1 F (38.4 C) (10/22 1600) Temp Source: Core (Comment) (10/22 1600) BP: 127/74 (10/22 1930) Pulse Rate: 78 (10/22 1930)  Labs:  Recent Labs  04/29/16 0500 04/29/16 1120  04/30/16 0443 04/30/16 1755 05/01/16 0321 05/01/16 1140 05/01/16 2040  HGB 7.6*  --   --  7.9*  --  8.2*  --   --   HCT 25.3*  --   --  26.5*  --  27.5*  --   --   PLT 259  --   --  272  --  270  --   --   HEPARINUNFRC 0.29*  --   < >  --   --  0.96* 0.82* 0.78*  CREATININE 1.01  --   < > 1.04 0.98 1.03  --   --   CKTOTAL  --  136  --   --   --   --   --   --   < > = values in this interval not displayed.  Estimated Creatinine Clearance: 91.7 mL/min (by C-G formula based on SCr of 1.03 mg/dL).  Assessment: [age] yoM with atrial flutter on warfarin PTA now on heparin per pharmacy dosing. Abdominal US negative for a bleed. Heparin level supratherapeutic (0.78) on gtt at 2200 units/hr. No issues with bleeding reported per RN.   Goal of Therapy: Heparin level 0.3 - 0.7 units/mL Monitor platelets by anticoagulation protocol: Yes  Plan:  Hold heparin drip for 1 hr  Decrease heparin to 2000 units/hr Daily HL/CBC  Leota SauersLisa Maitland Lesiak Pharm.D. CPP, BCPS Clinical Pharmacist [phone] 05/01/2016 9:21 PM

## 2016-05-02 ENCOUNTER — Inpatient Hospital Stay (HOSPITAL_COMMUNITY): Payer: Medicare HMO

## 2016-05-02 DIAGNOSIS — E8770 Fluid overload, unspecified: Secondary | ICD-10-CM

## 2016-05-02 DIAGNOSIS — Z960 Presence of urogenital implants: Secondary | ICD-10-CM

## 2016-05-02 DIAGNOSIS — Z930 Tracheostomy status: Secondary | ICD-10-CM

## 2016-05-02 DIAGNOSIS — Z79899 Other long term (current) drug therapy: Secondary | ICD-10-CM

## 2016-05-02 DIAGNOSIS — Z955 Presence of coronary angioplasty implant and graft: Secondary | ICD-10-CM

## 2016-05-02 DIAGNOSIS — Z89511 Acquired absence of right leg below knee: Secondary | ICD-10-CM

## 2016-05-02 DIAGNOSIS — R7881 Bacteremia: Secondary | ICD-10-CM

## 2016-05-02 DIAGNOSIS — Z794 Long term (current) use of insulin: Secondary | ICD-10-CM

## 2016-05-02 DIAGNOSIS — J15212 Pneumonia due to Methicillin resistant Staphylococcus aureus: Secondary | ICD-10-CM

## 2016-05-02 LAB — BASIC METABOLIC PANEL
ANION GAP: 8 (ref 5–15)
BUN: 43 mg/dL — ABNORMAL HIGH (ref 6–20)
CALCIUM: 8.1 mg/dL — AB (ref 8.9–10.3)
CHLORIDE: 117 mmol/L — AB (ref 101–111)
CO2: 28 mmol/L (ref 22–32)
Creatinine, Ser: 1.21 mg/dL (ref 0.61–1.24)
GFR calc non Af Amer: >60 mL/min (ref >60)
Glucose, Bld: 208 mg/dL — ABNORMAL HIGH (ref 65–99)
Potassium: 3.2 mmol/L — ABNORMAL LOW (ref 3.5–5.1)
Sodium: 153 mmol/L — ABNORMAL HIGH (ref 135–145)

## 2016-05-02 LAB — RENAL FUNCTION PANEL
ALBUMIN: 2.3 g/dL — AB (ref 3.5–5.0)
ANION GAP: 10 (ref 5–15)
BUN: 40 mg/dL — ABNORMAL HIGH (ref 6–20)
CALCIUM: 8.3 mg/dL — AB (ref 8.9–10.3)
CO2: 29 mmol/L (ref 22–32)
CREATININE: 1.14 mg/dL (ref 0.61–1.24)
Chloride: 115 mmol/L — ABNORMAL HIGH (ref 101–111)
GFR calc Af Amer: >60 mL/min (ref >60)
GFR calc non Af Amer: >60 mL/min (ref >60)
GLUCOSE: 130 mg/dL — AB (ref 65–99)
PHOSPHORUS: 3.4 mg/dL (ref 2.5–4.6)
Potassium: 3.2 mmol/L — ABNORMAL LOW (ref 3.5–5.1)
SODIUM: 154 mmol/L — AB (ref 135–145)

## 2016-05-02 LAB — GLUCOSE, CAPILLARY
GLUCOSE-CAPILLARY: 114 mg/dL — AB (ref 65–99)
GLUCOSE-CAPILLARY: 139 mg/dL — AB (ref 65–99)
GLUCOSE-CAPILLARY: 149 mg/dL — AB (ref 65–99)
GLUCOSE-CAPILLARY: 162 mg/dL — AB (ref 65–99)
GLUCOSE-CAPILLARY: 197 mg/dL — AB (ref 65–99)
GLUCOSE-CAPILLARY: 255 mg/dL — AB (ref 65–99)
Glucose-Capillary: 221 mg/dL — ABNORMAL HIGH (ref 65–99)
Glucose-Capillary: 152 mg/dL — ABNORMAL HIGH (ref 65–99)
Glucose-Capillary: 131 mg/dL — ABNORMAL HIGH (ref 65–99)
Glucose-Capillary: 125 mg/dL — ABNORMAL HIGH (ref 65–99)
Glucose-Capillary: 158 mg/dL — ABNORMAL HIGH (ref 65–99)
Glucose-Capillary: 189 mg/dL — ABNORMAL HIGH (ref 65–99)

## 2016-05-02 LAB — CULTURE, RESPIRATORY

## 2016-05-02 LAB — CBC
HCT: 28.8 % — ABNORMAL LOW (ref 39.0–52.0)
HEMOGLOBIN: 8.4 g/dL — AB (ref 13.0–17.0)
MCH: 27 pg (ref 26.0–34.0)
MCHC: 29.2 g/dL — AB (ref 30.0–36.0)
MCV: 92.6 fL (ref 78.0–100.0)
PLATELETS: 273 10*3/uL (ref 150–400)
RBC: 3.11 MIL/uL — ABNORMAL LOW (ref 4.22–5.81)
RDW: 18.5 % — AB (ref 11.5–15.5)
WBC: 10.8 10*3/uL — ABNORMAL HIGH (ref 4.0–10.5)

## 2016-05-02 LAB — HEPARIN LEVEL (UNFRACTIONATED)
HEPARIN UNFRACTIONATED: 0.5 [IU]/mL (ref 0.30–0.70)
HEPARIN UNFRACTIONATED: 0.55 [IU]/mL (ref 0.30–0.70)

## 2016-05-02 LAB — HEMOGLOBIN A1C
HEMOGLOBIN A1C: 7 % — AB (ref 4.8–5.6)
MEAN PLASMA GLUCOSE: 154 mg/dL

## 2016-05-02 LAB — HEPATITIS C ANTIBODY (REFLEX)

## 2016-05-02 LAB — HCV COMMENT:

## 2016-05-02 LAB — CULTURE, RESPIRATORY W GRAM STAIN

## 2016-05-02 LAB — MAGNESIUM: MAGNESIUM: 2.4 mg/dL (ref 1.7–2.4)

## 2016-05-02 MED ORDER — INSULIN ASPART 100 UNIT/ML ~~LOC~~ SOLN
0.0000 [IU] | SUBCUTANEOUS | Status: DC
  Administered 2016-05-02 (×2): 4 [IU] via SUBCUTANEOUS
  Administered 2016-05-02 (×2): 11 [IU] via SUBCUTANEOUS
  Administered 2016-05-03 (×2): 7 [IU] via SUBCUTANEOUS
  Administered 2016-05-03 (×2): 4 [IU] via SUBCUTANEOUS
  Administered 2016-05-03: 7 [IU] via SUBCUTANEOUS
  Administered 2016-05-04: 15 [IU] via SUBCUTANEOUS
  Administered 2016-05-04: 7 [IU] via SUBCUTANEOUS
  Administered 2016-05-04: 11 [IU] via SUBCUTANEOUS
  Administered 2016-05-04: 7 [IU] via SUBCUTANEOUS
  Administered 2016-05-04: 4 [IU] via SUBCUTANEOUS
  Administered 2016-05-04: 3 [IU] via SUBCUTANEOUS
  Administered 2016-05-05: 4 [IU] via SUBCUTANEOUS
  Administered 2016-05-05: 9 [IU] via SUBCUTANEOUS
  Administered 2016-05-05 (×2): 7 [IU] via SUBCUTANEOUS
  Administered 2016-05-05 – 2016-05-06 (×2): 4 [IU] via SUBCUTANEOUS
  Administered 2016-05-06: 11 [IU] via SUBCUTANEOUS
  Administered 2016-05-06: 7 [IU] via SUBCUTANEOUS
  Administered 2016-05-06: 12 [IU] via SUBCUTANEOUS
  Administered 2016-05-06 (×2): 7 [IU] via SUBCUTANEOUS
  Administered 2016-05-07: 4 [IU] via SUBCUTANEOUS
  Administered 2016-05-07: 7 [IU] via SUBCUTANEOUS
  Administered 2016-05-07: 4 [IU] via SUBCUTANEOUS

## 2016-05-02 MED ORDER — FREE WATER: 250.0000 mL | Status: DC

## 2016-05-02 MED ORDER — PRO-STAT SUGAR FREE PO LIQD
30.0000 mL | Freq: Three times a day (TID) | ORAL | Status: DC
  Administered 2016-05-02 – 2016-05-07 (×15): 30 mL
  Filled 2016-05-02 (×17): qty 30

## 2016-05-02 MED ORDER — FREE WATER
250.0000 mL | Status: DC
  Administered 2016-05-02 – 2016-05-04 (×12): 250 mL

## 2016-05-02 MED ORDER — INSULIN ASPART 100 UNIT/ML ~~LOC~~ SOLN
3.0000 [IU] | SUBCUTANEOUS | Status: DC
  Administered 2016-05-02 – 2016-05-03 (×5): 3 [IU] via SUBCUTANEOUS

## 2016-05-02 MED ORDER — DEXTROSE 10 % IV SOLN: INTRAVENOUS | Status: DC | PRN

## 2016-05-02 MED ORDER — INSULIN ASPART 100 UNIT/ML ~~LOC~~ SOLN: 2.0000 [IU] | SUBCUTANEOUS | Status: DC

## 2016-05-02 MED ORDER — INSULIN GLARGINE 100 UNIT/ML ~~LOC~~ SOLN
20.0000 [IU] | SUBCUTANEOUS | Status: DC
  Administered 2016-05-02 – 2016-05-03 (×2): 20 [IU] via SUBCUTANEOUS
  Filled 2016-05-02 (×2): qty 0.2

## 2016-05-02 MED ORDER — POTASSIUM CHLORIDE 20 MEQ/15ML (10%) PO SOLN
40.0000 meq | Freq: Once | ORAL | Status: AC
Start: 2016-05-02 — End: 2016-05-02
  Administered 2016-05-02: 40 meq

## 2016-05-02 MED ORDER — FUROSEMIDE 10 MG/ML IJ SOLN
40.0000 mg | Freq: Four times a day (QID) | INTRAMUSCULAR | Status: AC
  Administered 2016-05-02 – 2016-05-03 (×3): 40 mg via INTRAVENOUS
  Filled 2016-05-02 (×3): qty 4

## 2016-05-02 MED ORDER — POTASSIUM CHLORIDE 20 MEQ/15ML (10%) PO SOLN
40.0000 meq | Freq: Four times a day (QID) | ORAL | Status: AC
  Administered 2016-05-02 – 2016-05-03 (×3): 40 meq via ORAL
  Filled 2016-05-02 (×4): qty 30

## 2016-05-02 NOTE — Progress Notes (Signed)
PULMONARY / CRITICAL CARE MEDICINE   Name: Clayton [name] MRN: [id number] DOB: 1956/03/16    ADMISSION DATE:  04/16/2016  REFERRING MD:  Alvino Chapel (EDP)   CHIEF COMPLAINT:  Sepsis, respiratory failure   HISTORY OF PRESENT ILLNESS:   60yo male SNF resident with hx CHF, CAD s/p remote stent, DM, HTN, Aflutter s/p ablation on coumadin, severe PVD s/p R BKA, being treated for unknown infection at SNF with rocephin.  Presented 10/12 from SNF after being found unresponsive with agonal breaths.  Bagged by EMS en route and intubated on arrival to ER.  In ER was febrile to 104, hypotensive with SBP 70's.  Labs notable for AKI with K 7.1, glucose 500, WBC 36, lactate 3.6.  PCCM consulted for admit.   SUBJECTIVE: No events overnight. Febrile to 101. HR within normal limits. Failed SBT this AM.   REVIEW OF SYSTEMS:  Answers questions with nodding/shanking of head. Reports of abdominal pain. Reports he does not have appendix.  VITAL SIGNS: BP 97/67   Pulse 74   Temp (!) 101.1 F (38.4 C) (Core (Comment))   Resp (!) 22   Ht '5\' 10"'$  (1.778 m)   Wt 94 kg (207 lb 3.7 oz)   SpO2 100%   BMI 29.73 kg/m   HEMODYNAMICS:    VENTILATOR SETTINGS: Vent Mode: PRVC FiO2 (%):  [40 %] 40 % Set Rate:  [22 bmp] 22 bmp Vt Set:  [580 mL] 580 mL PEEP:  [5 cmH20] 5 cmH20 Plateau Pressure:  [20 cmH20-25 cmH20] 21 cmH20  INTAKE / OUTPUT: I/O last 3 completed shifts: In: 3600.8 [I.V.:1700.8; NG/GT:1500; IV Piggyback:400] Out: 5025 [Urine:5025]  PHYSICAL EXAMINATION:  General:  NAD  Neuro:  Answers questions  HEENT:  ETT in place. No scleral icterus or injection. Cardiovascular: regular rhythm. Systolic murmur noted. No appreciable JVD. Lungs: coarse breath sounds. Symmetric chest rise on ventilator.  Abdomen:  Normal bowel bowel sounds. Reports of tenderness to palpation on RLQ of abdomen. No guarding or rebound  Musculoskeletal:  Warm and dry. 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 Right  foot is wrapped in soft support.   LABS:  BMET  Recent Labs Lab 04/30/16 1755 05/01/16 0321 05/02/16 0415  NA 147* 147* 154*  K 3.3* 3.4* 3.2*  CL 114* 111 115*  CO2 '27 28 29  '$ BUN 34* 34* 40*  CREATININE 0.98 1.03 1.14  GLUCOSE 242* 220* 130*    Electrolytes  Recent Labs Lab 04/30/16 0443 04/30/16 1755 05/01/16 0321 05/02/16 0415  CALCIUM 8.1* 8.2* 8.4* 8.3*  MG 1.8  --  1.9 2.4  PHOS 3.4  3.4  --  3.5  3.6 3.4    CBC  Recent Labs Lab 04/30/16 0443 05/01/16 0321 05/02/16 0415  WBC 10.7* 11.3* 10.8*  HGB 7.9* 8.2* 8.4*  HCT 26.5* 27.5* 28.8*  PLT 272 270 273    Coag's No results for input(s): APTT, INR in the last 168 hours.  Sepsis Markers  Recent Labs Lab 04/29/16 1000 04/29/16 1120 05/01/16 0321  LATICACIDVEN 1.8  --   --   PROCALCITON  --  0.71 0.34    ABG No results for input(s): PHART, PCO2ART, PO2ART in the last 168 hours.  Liver Enzymes  Recent Labs Lab 04/27/16 0842  04/29/16 1120 04/30/16 0443 05/01/16 0321 05/02/16 0415  AST 80*  --  52*  --   --   --   ALT 58  --  54  --   --   --  ALKPHOS 170*  --  142*  --   --   --   BILITOT 1.1  --  0.9  --   --   --   ALBUMIN 1.7*  < > 1.8* 2.0* 2.1* 2.3*  < > = values in this interval not displayed.  Cardiac Enzymes No results for input(s): TROPONINI, PROBNP in the last 168 hours.  Glucose  Recent Labs Lab 05/02/16 0007 05/02/16 0124 05/02/16 0230 05/02/16 0416 05/02/16 0526 05/02/16 0634  GLUCAP 114* 149* 152* 131* 125* 139*    Imaging No results found.   STUDIES:  Renal U/S 10/12:  No abnormality or hydronephrosis.  TTE 10/13:  Difficult study. Mild LVH w/ EF 55%. Possible akinesis apical myocardium without obvious thrombus.Moderate aortic stenosis w/ mild mitral stenosis. RV poorly visualized. CT abdomen 10/15: no evidence for retroperitoneal hemorrhage. RLL central airspace disease compatible with aspiration and/or PNA CXR: 10/17: unchanged from 10/16. No  consolidation. Final read pending.  RUQ [place] 10/18: Cholelithiasis without sonographic evidence acute cholecystitis. A hepatobiliary scintigraphy may provide better evaluation of the gallbladder if an acute cholecystitis is clinically suspected. Fatty Liver.  CXR 10/19: No pulmonary edema. Stable endotracheal tube position. Hazy right infrahilar and right base atelectasis or infiltrate CXR 10/22: no acute abnormality  X-ray R Foot 10/21: no osteomyelitis   HIV 10/22: non reactive    MICROBIOLOGY: MRSA PCR 10/12:  Negative Blood Ctx x2 10/12 >> 1/2 Coag Neg Staph; 1/2 NG x 5 days  Urine Ctx 10/12:  Negative  Tracheal Asp Ctx 10/13 >> MRSA Respiratory Viral Panel PCR 10/13:  Negative  Blood Cx 10/21: PCR enterococcus, staph MRSA. Cx- gram positive cocci  Tracheal Aspirate cx 10/21: abundant staph aureus  Urine Culture 10/21: No growth final   ANTIBIOTICS: Zosyn 10/12 x1 dose; 10/16 >>10/17 Vancomycin 10/12 >>10/18 Cefepime 10/12 >> 10/16 Vancomycin 10/22 >> Ampicillin 10/22 >>  SIGNIFICANT EVENTS: 10/12 - Admit & intubation in ED 10/14 - switched from Levo to Neo due to intermittent tachycardia  10/15 - off pressor 10/17 - Failed wean 10/18 - failed wean started lasix; Completed Vanc for PNA  10/19 - Failed wean  10/20 - continues to have fevers despite completing abx therapy for PNA. ID consulted.  ESR and CRP elevated.  10/22 - possible bacteremia with Enterococcus/MRSA on PCA with pending cultures. Started on Vanc and Ampicillin; Lasix switched from TID to BID.    LINES/TUBES: OETT 7.5 10/12 >>  RUE PICC (SNF) 10/11 >>   Foley 10/12 >> PEG >> PIV x1  ASSESSMENT / PLAN:  Clayton [name] is a  60 y.o. male with PMH CHF, CAD s/p remote stent, DM, HTN, Aflutter s/p ablation on anticoagulation, severe PVD s/p R BKA. Presenting with septic shock likely from MRSA PNA. Is on Heparin for history of a flutter.  Attempted SBT but did not pass; Possibly fluid overloaded therefore diuresing  wtih Lasix. Continues to have fevers despite completing antibiotic therapy for MRSA PNA. He was found to have possible bacteremia with Enterococcus/MRSA on PCR in 1/2 bottles with pending cultures from 10/22 and started on Vancomycin and Ampicillin.   INFECTIOUS A: Severe Sepsis, improving  RLL MRSA PNA: tracheal aspirate culture with MRSA   Low Grade Temperatures: elevated CRP/ESR with Possible bacteremia with Enterococcus/MRSA on PCR in 1/2 bottles with pending cultures from 10/22.   P:   S/p Vancomycin for 7 days  S/p Cefepime and Zosyn Restarted Vanc 10/22 Ampicillin (10/22 >) ID Consulted:  If he grows the Enterococcus  or the Coag Neg Staph in 2/2 would get a TEE given ICD and concern for endocarditis. Will touch base with ID as patient denies having pacemaker and unable to find in chart.  Procalcitonin Hepatitis Panel pending  PULMONARY A: Acute Hypoxic Respiratory Failure - RLL central airspace disease compatible with aspiration/PNA on Imaging (CT). Treated with abx.  P:   Full Vent Support  Daily PS Wean & SBT Intermittent CXR & ABG Albuterol nebs prn  CARDIOVASCULAR A: Shock - Likely septic. Resolved and off presors.  Elevated Troponin I - Likely demand ischemia. Echo was difficult to interpret. Questionable apical akinesis. Aortic Stenosis - Seen on poor quality TTE. H/O CAD w/ Stent H/O CHF - Dilated LV w/ decreased function November 2016. H/O A Flutter - S/P ablation, but had intermittent episodes while hospitalized   P:  Cardizem '15mg'$  q 6 hours  Heparin drip  Continuous telemetry monitoring Vitals per unit protocol ASA '81mg'$  VT daily Lasix IV '40mg'$  BID Holding home lasix, metoprolol, lisinopril, & eliquis   RENAL A: Acute Renal Failure - Resolved. Cr stable while on diuresis. Had renal US earlier in hospital stay which was unremarkable. Hypernatremia - elevated 154 this AM  Hypophosphatemia - Resolved Hypokalemia - 3.2 this AM  Hypomagnesemia -  resolved Lactic Acidosis - Resolved. AGMA - Resolved.   P:   Monitoring UOP with Foley Trending Electrolytes and Renal function KCl soln 90mq BID scheduled while on diuresis  Will add 422m dose x 1 today  GASTROINTESTINAL A: Loose stools: c diff negative  RUQ tenderness : RUQ [place] cholelithiasis without sonographic evidence of cholecystitis. Tenderness resolved.  RLQ Tenderness: reported by patient, but without guarding or rebound   P:   NPO  Tube feeds  Protonix VT daily   HEMATOLOGIC A: Leukocytosis -  resolved; in the setting of pneumonia   Anemia - stable hgb. S/p 1 unit pRBC 10/16. Hgb stable this AM. No signs of active bleeding  Coagulopathy - Possibly secondary to Eliquis vs Coumadin.  P:  Trending cell counts daily w/ CBC Trending Hgb/Hct q12hr Transfuse for Hgb <7.0 Heparin Drip   ENDOCRINE A: H/O DM - A1c 10.6 in June 2017. CBGs in 200s in the setting of tube feeds   P:   SSI standard Scheduled Novolog 3 units q 4 hrs (only with tube feeds) Lantus 20 U  Accu-Checks q4hr  NEUROLOGIC A: Acute Encephalopathy - Multifactorial from hypoxia and probable toxic metabolic. Sedation on Ventilator  P:   RASS goal: 0 to -1 Precedex gtt  Versed IV prn sedation   INTEGUMENT:  A: Sacral Pressure Injury  P: - wound care: santyl with saline moistened gauze daily covered with dry gauze    KaSmiley HousemanMD PGY 2 Family Medicine  05/02/2016  7:06 AM  WeRush FarmerM.D. [facility]. Pager: [phone] hours pager: [phone]

## 2016-05-02 NOTE — Progress Notes (Signed)
PULMONARY / CRITICAL CARE MEDICINE   Name: Clayton [name] MRN: [id number] DOB: [birth year]    ADMISSION DATE:  05/03/2016  REFERRING MD:  Alvino Chapel (EDP)   CHIEF COMPLAINT:  Sepsis, respiratory failure   HISTORY OF PRESENT ILLNESS:   60yo male SNF resident with hx CHF, CAD s/p remote stent, DM, HTN, Aflutter s/p ablation on coumadin, severe PVD s/p R BKA, being treated for unknown infection at SNF with rocephin.  Presented 10/12 from SNF after being found unresponsive with agonal breaths.  Bagged by EMS en route and intubated on arrival to ER.  In ER was febrile to 104, hypotensive with SBP 70's.  Labs notable for AKI with K 7.1, glucose 500, WBC 36, lactate 3.6.  PCCM consulted for admit.   SUBJECTIVE: No events overnight. Febrile to 101. HR within normal limits. Failed SBT this AM.   VITAL SIGNS: BP (!) 134/39   Pulse 71   Temp (!) 101.5 F (38.6 C)   Resp (!) 24   Ht _0  (1.778 m)   Wt 94 kg (207 lb 3.7 oz)   SpO2 100%   BMI 29.73 kg/m   HEMODYNAMICS:    VENTILATOR SETTINGS: Vent Mode: PRVC FiO2 (%):  [40 %] 40 % Set Rate:  [22 bmp] 22 bmp Vt Set:  [580 mL] 580 mL PEEP:  [5 cmH20] 5 cmH20 Pressure Support:  [15 cmH20] 15 cmH20 Plateau Pressure:  [20 cmH20-25 cmH20] 21 cmH20  INTAKE / OUTPUT: I/O last 3 completed shifts: In: 3600.8 [I.V.:1700.8; NG/GT:1500; IV Piggyback:400] Out: 7564 [Urine:5025]  PHYSICAL EXAMINATION:  General:  NAD, sedate and intubated Neuro:  Nodding appropriately, moving all ext to command HEENT:  ETT in place. No scleral icterus or injection. Cardiovascular: regular rhythm. Systolic murmur noted. No appreciable JVD. Lungs: coarse breath sounds. Symmetric chest rise on ventilator.  Abdomen:  Normal bowel bowel sounds. Reports of tenderness to palpation on RLQ of abdomen. No guarding or rebound  Musculoskeletal:  Warm and dry. 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 Right foot is wrapped in soft support.    LABS:  BMET  Recent Labs Lab 04/30/16 1755 05/01/16 0321 05/02/16 0415  NA 147* 147* 154*  K 3.3* 3.4* 3.2*  CL 114* 111 115*  CO2 _1 BUN 34* 34* 40*  CREATININE 0.98 1.03 1.14  GLUCOSE 242* 220* 130*    Electrolytes  Recent Labs Lab 04/30/16 0443 04/30/16 1755 05/01/16 0321 05/02/16 0415  CALCIUM 8.1* 8.2* 8.4* 8.3*  MG 1.8  --  1.9 2.4  PHOS 3.4  3.4  --  3.5  3.6 3.4    CBC  Recent Labs Lab 04/30/16 0443 05/01/16 0321 05/02/16 0415  WBC 10.7* 11.3* 10.8*  HGB 7.9* 8.2* 8.4*  HCT 26.5* 27.5* 28.8*  PLT 272 270 273    Coag's No results for input(s): APTT, INR in the last 168 hours.  Sepsis Markers  Recent Labs Lab 04/29/16 1000 04/29/16 1120 05/01/16 0321  LATICACIDVEN 1.8  --   --   PROCALCITON  --  0.71 0.34    ABG No results for input(s): PHART, PCO2ART, PO2ART in the last 168 hours.  Liver Enzymes  Recent Labs Lab 04/27/16 0842  04/29/16 1120 04/30/16 0443 05/01/16 0321 05/02/16 0415  AST 80*  --  52*  --   --   --   ALT 58  --  54  --   --   --   ALKPHOS 170*  --  142*  --   --   --  BILITOT 1.1  --  0.9  --   --   --   ALBUMIN 1.7*  < > 1.8* 2.0* 2.1* 2.3*  < > = values in this interval not displayed.  Cardiac Enzymes No results for input(s): TROPONINI, PROBNP in the last 168 hours.  Glucose  Recent Labs Lab 05/02/16 0230 05/02/16 0416 05/02/16 0526 05/02/16 0634 05/02/16 0817 05/02/16 0953  GLUCAP 152* 131* 125* 139* 162* 158*    Imaging Dg Chest Port 1 View  Result Date: 05/02/2016 CLINICAL DATA:  Check endotracheal tube placement EXAM: PORTABLE CHEST 1 VIEW COMPARISON:  05/01/2016 FINDINGS: Cardiac shadow is within normal limits. Endotracheal tube is again seen but lies just above the carina. This could be withdrawn 2-3 cm. A right-sided PICC line is again noted in satisfactory position. The overall inspiratory effort is poor. Mild bibasilar atelectatic changes are seen. No bony abnormality is  noted. IMPRESSION: Endotracheal tube at the level of the carina. This should be withdrawn 2-3 cm. These results will be called to the ordering clinician or representative by the Radiologist Assistant, and communication documented in the PACS or zVision Dashboard. Electronically Signed   By: Inez Catalina M.D.   On: 05/02/2016 07:38     STUDIES:  Renal U/S 10/12:  No abnormality or hydronephrosis.  TTE 10/13:  Difficult study. Mild LVH w/ EF 55%. Possible akinesis apical myocardium without obvious thrombus.Moderate aortic stenosis w/ mild mitral stenosis. RV poorly visualized. CT abdomen 10/15: no evidence for retroperitoneal hemorrhage. RLL central airspace disease compatible with aspiration and/or PNA CXR: 10/17: unchanged from 10/16. No consolidation. Final read pending.  RUQ [place] 10/18: Cholelithiasis without sonographic evidence acute cholecystitis. A hepatobiliary scintigraphy may provide better evaluation of the gallbladder if an acute cholecystitis is clinically suspected. Fatty Liver.  CXR 10/19: No pulmonary edema. Stable endotracheal tube position. Hazy right infrahilar and right base atelectasis or infiltrate CXR 10/22: no acute abnormality  X-ray R Foot 10/21: no osteomyelitis   HIV 10/22: non reactive    MICROBIOLOGY: MRSA PCR 10/12:  Negative Blood Ctx x2 10/12 >> 1/2 Coag Neg Staph; 1/2 NG x 5 days  Urine Ctx 10/12:  Negative  Tracheal Asp Ctx 10/13 >> MRSA Respiratory Viral Panel PCR 10/13:  Negative  Blood Cx 10/21: PCR enterococcus, staph MRSA. Cx- gram positive cocci  Tracheal Aspirate cx 10/21: abundant staph aureus  Urine Culture 10/21: No growth final   ANTIBIOTICS: Zosyn 10/12 x1 dose; 10/16 >>10/17 Vancomycin 10/12 >>10/18 Cefepime 10/12 >> 10/16 Vancomycin 10/22 >> Ampicillin 10/22 >>  SIGNIFICANT EVENTS: 10/12 - Admit & intubation in ED 10/14 - switched from Levo to Neo due to intermittent tachycardia  10/15 - off pressor 10/17 - Failed wean 10/18 - failed  wean started lasix; Completed Vanc for PNA  10/19 - Failed wean  10/20 - continues to have fevers despite completing abx therapy for PNA. ID consulted.  ESR and CRP elevated.  10/22 - possible bacteremia with Enterococcus/MRSA on PCA with pending cultures. Started on Vanc and Ampicillin; Lasix switched from TID to BID.    LINES/TUBES: OETT 7.5 10/12 >>  RUE PICC (SNF) 10/11 >>   Foley 10/12 >> PEG >> PIV x1  ASSESSMENT / PLAN:  Mr. Mccallum is a  60 y.o. male with PMH CHF, CAD s/p remote stent, DM, HTN, Aflutter s/p ablation on anticoagulation, severe PVD s/p R BKA. Presenting with septic shock likely from MRSA PNA. Is on Heparin for history of a flutter.  Attempted SBT but did not  pass; Possibly fluid overloaded therefore diuresing wtih Lasix. Continues to have fevers despite completing antibiotic therapy for MRSA PNA. He was found to have possible bacteremia with Enterococcus/MRSA on PCR in 1/2 bottles with pending cultures from 10/22 and started on Vancomycin and Ampicillin.   INFECTIOUS A: Severe Sepsis, improving  RLL MRSA PNA: tracheal aspirate culture with MRSA   Low Grade Temperatures: elevated CRP/ESR with Possible bacteremia with Enterococcus/MRSA on PCR in 1/2 bottles with pending cultures from 10/22.   P:   S/p Vancomycin for 7 days  S/p Cefepime and Zosyn Restarted Vanc 10/22, continue for now Ampicillin 10/22 > ID Consulted:  If he grows the Enterococcus or the Coag Neg Staph in 2/2 would get a TEE given ICD and concern for endocarditis. Will touch base with ID as patient denies having pacemaker and unable to find in chart.  Procalcitonin Hepatitis Panel pending  PULMONARY A: Acute Hypoxic Respiratory Failure - RLL central airspace disease compatible with aspiration/PNA on Imaging (CT). Treated with abx.  P:   Full Vent Support  Daily PS Wean & SBT Intermittent CXR & ABG Albuterol nebs prn Continue active diureses  CARDIOVASCULAR A: Shock - Likely septic.  Resolved and off presors.  Elevated Troponin I - Likely demand ischemia. Echo was difficult to interpret. Questionable apical akinesis. Aortic Stenosis - Seen on poor quality TTE. H/O CAD w/ Stent H/O CHF - Dilated LV w/ decreased function November 2016. H/O A Flutter - S/P ablation, but had intermittent episodes while hospitalized   P:  Cardizem 78m q 6 hours  Heparin drip, was on coumadin prior Continuous telemetry monitoring Vitals per unit protocol ASA 857mVT daily Active diureses  RENAL A: Acute Renal Failure - Resolved. Cr stable while on diuresis. Had renal [place] in hospital stay which was unremarkable. Hypernatremia - elevated 154 this AM  Hypophosphatemia - Resolved Hypokalemia - 3.2 this AM  Hypomagnesemia - resolved Lactic Acidosis - Resolved. AGMA - Resolved.   P:   Monitoring UOP with Foley Trending Electrolytes and Renal function Lasix 40 mg IV q6 x3 doses Free water 250 q4. Replace electrolytes as indicated  GASTROINTESTINAL A: Loose stools: c diff negative  RUQ tenderness : RUQ [place] cholelithiasis without sonographic evidence of cholecystitis. Tenderness resolved.  RLQ Tenderness: reported by patient, but without guarding or rebound   P:   NPO  Tube feeds  Protonix VT daily   HEMATOLOGIC A: Leukocytosis -  resolved; in the setting of pneumonia   Anemia - stable hgb. S/p 1 unit pRBC 10/16. Hgb stable this AM. No signs of active bleeding  Coagulopathy - Possibly secondary to Eliquis vs Coumadin.  P:  Trending cell counts daily w/ CBC Trending Hgb/Hct q12hr Transfuse for Hgb <7.0 Heparin Drip   ENDOCRINE A: H/O DM - A1c 10.6 in June 2017. CBGs in 200s in the setting of tube feeds   P:   SSI standard Scheduled Novolog 3 units q 4 hrs (only with tube feeds) Lantus 20 U  Accu-Checks q4hr  NEUROLOGIC A: Acute Encephalopathy - Multifactorial from hypoxia and probable toxic metabolic. Sedation on Ventilator  P:   RASS goal: 0 to  -1 Precedex gtt  Versed IV prn sedation   INTEGUMENT:  A: Sacral Pressure Injury  P: - wound care: santyl with saline moistened gauze daily covered with dry gauze   No family bedside to update.  The patient is critically ill with multiple organ systems failure and requires high complexity decision making for assessment and  support, frequent evaluation and titration of therapies, application of advanced monitoring technologies and extensive interpretation of multiple databases.   Critical Care Time devoted to patient care services described in this note is  35  Minutes. This time reflects time of care of this signee Dr Jennet Maduro. This critical care time does not reflect procedure time, or teaching time or supervisory time of PA/NP/Med student/Med Resident etc but could involve care discussion time.  Rush Farmer, M.D. [facility] Pulmonary/Critical Care Medicine. Pager: [phone]. After hours pager: [phone].

## 2016-05-02 NOTE — Progress Notes (Signed)
CSW confirmed with Patton SallesKathy Campbell, [facility] and not [facility] an ALF as previously mentioned. CSW following for disposition and return to SNF when medically stable and appropriate.          Lance MussAshley Gardner,MSW, LCSW [facility]/90M Clinical Social Worker [phone]

## 2016-05-02 NOTE — Progress Notes (Signed)
RT note-Placed back to full support due to RR 45-50 bpm. RN aware

## 2016-05-02 NOTE — Progress Notes (Signed)
Subjective:  Still having fevers, remains disoriented  Antibiotics:  Anti-infectives    Start     Dose/Rate Route Frequency Ordered Stop   05/01/16 1000  ampicillin (OMNIPEN) 2 g in sodium chloride 0.9 % 50 mL IVPB     2 g 150 mL/hr over 20 Minutes Intravenous Every 6 hours 05/01/16 0946     05/01/16 0900  ceFAZolin (ANCEF) IVPB 2g/100 mL premix  Status:  Discontinued     2 g 200 mL/hr over 30 Minutes Intravenous Every 8 hours 05/01/16 0835 05/01/16 0844   05/01/16 0900  vancomycin (VANCOCIN) 500 mg in sodium chloride 0.9 % 100 mL IVPB     500 mg 100 mL/hr over 60 Minutes Intravenous Every 12 hours 05/01/16 0835     05/01/16 0900  ceFAZolin (ANCEF) IVPB 2g/100 mL premix  Status:  Discontinued     2 g 200 mL/hr over 30 Minutes Intravenous Every 8 hours 05/01/16 0844 05/01/16 0946   04/26/16 1600  vancomycin (VANCOCIN) 500 mg in sodium chloride 0.9 % 100 mL IVPB     500 mg 100 mL/hr over 60 Minutes Intravenous Every 12 hours 04/26/16 1112 04/27/16 1626   04/26/16 1100  doxycycline (VIBRAMYCIN) 100 mg in dextrose 5 % 250 mL IVPB  Status:  Discontinued     100 mg 125 mL/hr over 120 Minutes Intravenous Every 12 hours 04/26/16 1053 04/26/16 1100   04/25/16 1600  vancomycin (VANCOCIN) 500 mg in sodium chloride 0.9 % 100 mL IVPB  Status:  Discontinued     500 mg 100 mL/hr over 60 Minutes Intravenous Every 12 hours 04/25/16 1558 04/26/16 1053   04/25/16 1230  piperacillin-tazobactam (ZOSYN) IVPB 3.375 g  Status:  Discontinued     3.375 g 12.5 mL/hr over 240 Minutes Intravenous Every 8 hours 04/25/16 1222 04/26/16 1053   04/23/16 0000  ceFEPIme (MAXIPIME) 1 g in dextrose 5 % 50 mL IVPB  Status:  Discontinued     1 g 100 mL/hr over 30 Minutes Intravenous Every 12 hours 04/22/16 1439 04/25/16 1117   04/22/16 1500  vancomycin (VANCOCIN) IVPB 750 mg/150 ml premix  Status:  Discontinued     750 mg 150 mL/hr over 60 Minutes Intravenous Every 12 hours 04/22/16 1439 04/25/16 1550   04/14/2016 1400  piperacillin-tazobactam (ZOSYN) IVPB 2.25 g  Status:  Discontinued     2.25 g 100 mL/hr over 30 Minutes Intravenous Every 6 hours 05/05/2016 0831 05/04/2016 0914   05/01/2016 1200  ceFEPIme (MAXIPIME) 1 g in dextrose 5 % 50 mL IVPB  Status:  Discontinued     1 g 100 mL/hr over 30 Minutes Intravenous Every 24 hours 04/22/2016 0914 04/22/16 1439   05/06/2016 0730  piperacillin-tazobactam (ZOSYN) IVPB 3.375 g     3.375 g 100 mL/hr over 30 Minutes Intravenous  Once 04/10/2016 0723 05/02/2016 0815   04/18/2016 0730  vancomycin (VANCOCIN) IVPB 1000 mg/200 mL premix  Status:  Discontinued     1,000 mg 200 mL/hr over 60 Minutes Intravenous  Once 04/27/2016 0723 05/05/2016 0726   04/12/2016 0730  vancomycin (VANCOCIN) 2,000 mg in sodium chloride 0.9 % 500 mL IVPB     2,000 mg 250 mL/hr over 120 Minutes Intravenous  Once 04/11/2016 0726 04/14/2016 1033      Medications: Scheduled Meds: . ampicillin (OMNIPEN) IV  2 g Intravenous Q6H  . aspirin  81 mg Oral Daily  . chlorhexidine gluconate (MEDLINE KIT)  15 mL Mouth Rinse BID  .  collagenase   Topical Daily  . diltiazem  15 mg Per Tube Q6H  . feeding supplement (PRO-STAT SUGAR FREE 64)  30 mL Per Tube TID  . free water  250 mL Per Tube Q4H  . furosemide  40 mg Intravenous Q6H  . insulin aspart  0-20 Units Subcutaneous Q4H  . insulin aspart  3 Units Subcutaneous Q4H  . insulin glargine  20 Units Subcutaneous Q24H  . mouth rinse  15 mL Mouth Rinse QID  . pantoprazole sodium  40 mg Per Tube Q1200  . potassium chloride  40 mEq Oral Q6H  . vancomycin  500 mg Intravenous Q12H   Continuous Infusions: . sodium chloride 10 mL/hr at 04/30/16 0600  . dexmedetomidine 0.7 mcg/kg/hr (05/02/16 1352)  . dextrose    . feeding supplement (VITAL HIGH PROTEIN) 1,000 mL (05/01/16 0857)  . heparin 2,000 Units/hr (05/02/16 1554)    Objective: Weight change:   Intake/Output Summary (Last 24 hours) at 05/02/16 1615 Last data filed at 05/02/16 1600  Gross per 24 hour    Intake          3056.72 ml  Output             3150 ml  Net           -93.28 ml   Blood pressure 134/70, pulse 72, temperature (!) 102.2 F (39 C), resp. rate (!) 22, height _0  (1.778 m), weight 207 lb 3.7 oz (94 kg), SpO2 100 %. Temp:  [101.5 F (38.6 C)-102.2 F (39 C)] 102.2 F (39 C) (10/23 1500) Pulse Rate:  [41-79] 72 (10/23 1500) Resp:  [16-25] 22 (10/23 1500) BP: (97-142)/(39-77) 134/70 (10/23 1500) SpO2:  [100 %] 100 % (10/23 1500) FiO2 (%):  [40 %] 40 % (10/23 1234) Weight:  [207 lb 3.7 oz (94 kg)] 207 lb 3.7 oz (94 kg) (10/23 0432)  Physical Exam: Cardiovascular: regular rate, normal r,  no murmur rubs or gallops Pulmonary:few rhonchi Gastrointestinal: soft nontender, nondistended, normal bowel sounds, sitting in liquid stool. Foley in place Musculoskeletal/skin: Left BKA is clean and Right foot amputation site with healing ulcer laterally Neuro: nonfocal, strength and sensation intact  CBC:  CBC Latest Ref Rng & Units 05/02/2016 05/01/2016 04/30/2016  WBC 4.0 - 10.5 K/uL 10.8(H) 11.3(H) 10.7(H)  Hemoglobin 13.0 - 17.0 g/dL 8.4(L) 8.2(L) 7.9(L)  Hematocrit 39.0 - 52.0 % 28.8(L) 27.5(L) 26.5(L)  Platelets 150 - 400 K/uL 273 270 272      BMET  Recent Labs  05/02/16 0415 05/02/16 1455  NA 154* 153*  K 3.2* 3.2*  CL 115* 117*  CO2 29 28  GLUCOSE 130* 208*  BUN 40* 43*  CREATININE 1.14 1.21  CALCIUM 8.3* 8.1*     Liver Panel   Recent Labs  05/01/16 0321 05/02/16 0415  ALBUMIN 2.1* 2.3*       Sedimentation Rate  Recent Labs  05/01/16 0321  ESRSEDRATE >140*   C-Reactive Protein  Recent Labs  05/01/16 0321  CRP 4.2*    Micro Results: Recent Results (from the past 720 hour(s))  Blood Culture (routine x 2)     Status: None   Collection Time: 04/30/2016  7:30 AM  Result Value Ref Range Status   Specimen Description BLOOD LEFT ANTECUBITAL  Final   Special Requests BOTTLES DRAWN AEROBIC AND ANAEROBIC 5CC  Final   Culture NO  GROWTH 5 DAYS  Final   Report Status 04/26/2016 FINAL  Final  Blood Culture (routine x 2)  Status: Abnormal   Collection Time: 05/07/2016  7:35 AM  Result Value Ref Range Status   Specimen Description BLOOD RIGHT HAND  Final   Special Requests BOTTLES DRAWN AEROBIC ONLY 5CC  Final   Culture  Setup Time   Final    GRAM POSITIVE COCCI IN CLUSTERS AEROBIC BOTTLE ONLY Organism ID to follow CRITICAL RESULT CALLED TO, READ BACK BY AND VERIFIED WITH: J. CARNEY, PHARM AT 0740 ON [id number] BY Rhea Bleacher    Culture (A)  Final    STAPHYLOCOCCUS SPECIES (COAGULASE NEGATIVE) THE SIGNIFICANCE OF ISOLATING THIS ORGANISM FROM A SINGLE SET OF BLOOD CULTURES WHEN MULTIPLE SETS ARE DRAWN IS UNCERTAIN. PLEASE NOTIFY THE MICROBIOLOGY DEPARTMENT WITHIN ONE WEEK IF SPECIATION AND SENSITIVITIES ARE REQUIRED.    Report Status 04/24/2016 FINAL  Final  Blood Culture ID Panel (Reflexed)     Status: Abnormal   Collection Time: 04/15/2016  7:35 AM  Result Value Ref Range Status   Enterococcus species NOT DETECTED NOT DETECTED Final   Listeria monocytogenes NOT DETECTED NOT DETECTED Final   Staphylococcus species DETECTED (A) NOT DETECTED Final    Comment: CRITICAL RESULT CALLED TO, READ BACK BY AND VERIFIED WITH: J. CARNEY, [place] [address] BY S. YARBROUGH    Staphylococcus aureus NOT DETECTED NOT DETECTED Final   Methicillin resistance DETECTED (A) NOT DETECTED Final    Comment: CRITICAL RESULT CALLED TO, READ BACK BY AND VERIFIED WITH: J. CARNEY, [place] [address] BY S. YARBROUGH    Streptococcus species NOT DETECTED NOT DETECTED Final   Streptococcus agalactiae NOT DETECTED NOT DETECTED Final   Streptococcus pneumoniae NOT DETECTED NOT DETECTED Final   Streptococcus pyogenes NOT DETECTED NOT DETECTED Final   Acinetobacter baumannii NOT DETECTED NOT DETECTED Final   Enterobacteriaceae species NOT DETECTED NOT DETECTED Final   Enterobacter cloacae complex NOT DETECTED NOT DETECTED Final    Escherichia coli NOT DETECTED NOT DETECTED Final   Klebsiella oxytoca NOT DETECTED NOT DETECTED Final   Klebsiella pneumoniae NOT DETECTED NOT DETECTED Final   Proteus species NOT DETECTED NOT DETECTED Final   Serratia marcescens NOT DETECTED NOT DETECTED Final   Haemophilus influenzae NOT DETECTED NOT DETECTED Final   Neisseria meningitidis NOT DETECTED NOT DETECTED Final   Pseudomonas aeruginosa NOT DETECTED NOT DETECTED Final   Candida albicans NOT DETECTED NOT DETECTED Final   Candida glabrata NOT DETECTED NOT DETECTED Final   Candida krusei NOT DETECTED NOT DETECTED Final   Candida parapsilosis NOT DETECTED NOT DETECTED Final   Candida tropicalis NOT DETECTED NOT DETECTED Final  Urine culture     Status: None   Collection Time: 04/14/2016  8:41 AM  Result Value Ref Range Status   Specimen Description URINE, CATHETERIZED  Final   Special Requests NONE  Final   Culture NO GROWTH  Final   Report Status 04/22/2016 FINAL  Final  MRSA PCR Screening     Status: None   Collection Time: 04/20/2016 12:20 PM  Result Value Ref Range Status   MRSA by PCR NEGATIVE NEGATIVE Final    Comment:        The GeneXpert MRSA Assay (FDA approved for NASAL specimens only), is one component of a comprehensive MRSA colonization surveillance program. It is not intended to diagnose MRSA infection nor to guide or monitor treatment for MRSA infections.   Culture, respiratory (NON-Expectorated)     Status: None   Collection Time: 04/22/16 12:18 PM  Result Value Ref Range Status   Specimen Description  TRACHEAL ASPIRATE  Final   Special Requests Normal  Final   Gram Stain   Final    MODERATE WBC PRESENT, PREDOMINANTLY PMN FEW GRAM POSITIVE COCCI IN PAIRS IN SINGLES    Culture   Final    MODERATE METHICILLIN RESISTANT STAPHYLOCOCCUS AUREUS   Report Status 04/25/2016 FINAL  Final   Organism ID, Bacteria METHICILLIN RESISTANT STAPHYLOCOCCUS AUREUS  Final      Susceptibility   Methicillin resistant  staphylococcus aureus - MIC*    CIPROFLOXACIN >=8 RESISTANT Resistant     ERYTHROMYCIN >=8 RESISTANT Resistant     GENTAMICIN <=0.5 SENSITIVE Sensitive     OXACILLIN >=4 RESISTANT Resistant     TETRACYCLINE <=1 SENSITIVE Sensitive     VANCOMYCIN 1 SENSITIVE Sensitive     TRIMETH/SULFA <=10 SENSITIVE Sensitive     CLINDAMYCIN >=8 RESISTANT Resistant     RIFAMPIN <=0.5 SENSITIVE Sensitive     Inducible Clindamycin NEGATIVE Sensitive     * MODERATE METHICILLIN RESISTANT STAPHYLOCOCCUS AUREUS  Respiratory Panel by PCR     Status: None   Collection Time: 04/22/16  1:11 PM  Result Value Ref Range Status   Adenovirus NOT DETECTED NOT DETECTED Final   Coronavirus 229E NOT DETECTED NOT DETECTED Final   Coronavirus HKU1 NOT DETECTED NOT DETECTED Final   Coronavirus NL63 NOT DETECTED NOT DETECTED Final   Coronavirus OC43 NOT DETECTED NOT DETECTED Final   Metapneumovirus NOT DETECTED NOT DETECTED Final   Rhinovirus / Enterovirus NOT DETECTED NOT DETECTED Final   Influenza A NOT DETECTED NOT DETECTED Final   Influenza B NOT DETECTED NOT DETECTED Final   Parainfluenza Virus 1 NOT DETECTED NOT DETECTED Final   Parainfluenza Virus 2 NOT DETECTED NOT DETECTED Final   Parainfluenza Virus 3 NOT DETECTED NOT DETECTED Final   Parainfluenza Virus 4 NOT DETECTED NOT DETECTED Final   Respiratory Syncytial Virus NOT DETECTED NOT DETECTED Final   Bordetella pertussis NOT DETECTED NOT DETECTED Final   Chlamydophila pneumoniae NOT DETECTED NOT DETECTED Final   Mycoplasma pneumoniae NOT DETECTED NOT DETECTED Final  C difficile quick scan w PCR reflex     Status: None   Collection Time: 04/25/16 10:56 PM  Result Value Ref Range Status   C Diff antigen NEGATIVE NEGATIVE Final   C Diff toxin NEGATIVE NEGATIVE Final   C Diff interpretation No C. difficile detected.  Final  Culture, Urine     Status: None   Collection Time: 04/30/16  9:08 AM  Result Value Ref Range Status   Specimen Description URINE,  CATHETERIZED  Final   Special Requests NONE  Final   Culture NO GROWTH  Final   Report Status 05/01/2016 FINAL  Final  Culture, respiratory (NON-Expectorated)     Status: None   Collection Time: 04/30/16  9:10 AM  Result Value Ref Range Status   Specimen Description TRACHEAL ASPIRATE  Final   Special Requests NONE  Final   Gram Stain   Final    ABUNDANT WBC PRESENT,BOTH PMN AND MONONUCLEAR ABUNDANT GRAM POSITIVE COCCI IN CLUSTERS    Culture   Final    ABUNDANT METHICILLIN RESISTANT STAPHYLOCOCCUS AUREUS   Report Status 05/02/2016 FINAL  Final   Organism ID, Bacteria METHICILLIN RESISTANT STAPHYLOCOCCUS AUREUS  Final      Susceptibility   Methicillin resistant staphylococcus aureus - MIC*    CIPROFLOXACIN >=8 RESISTANT Resistant     ERYTHROMYCIN >=8 RESISTANT Resistant     GENTAMICIN <=0.5 SENSITIVE Sensitive     OXACILLIN >=4  RESISTANT Resistant     TETRACYCLINE <=1 SENSITIVE Sensitive     VANCOMYCIN 1 SENSITIVE Sensitive     TRIMETH/SULFA <=10 SENSITIVE Sensitive     CLINDAMYCIN >=8 RESISTANT Resistant     RIFAMPIN <=0.5 SENSITIVE Sensitive     Inducible Clindamycin NEGATIVE Sensitive     * ABUNDANT METHICILLIN RESISTANT STAPHYLOCOCCUS AUREUS  Culture, blood (Routine X 2) w Reflex to ID Panel     Status: None (Preliminary result)   Collection Time: 04/30/16 12:38 PM  Result Value Ref Range Status   Specimen Description BLOOD RIGHT HAND  Final   Special Requests IN PEDIATRIC BOTTLE 4CC  Final   Culture  Setup Time   Final    GRAM POSITIVE COCCI IN CLUSTERS IN PEDIATRIC BOTTLE CRITICAL RESULT CALLED TO, READ BACK BY AND VERIFIED WITH: K AMEND,PHARMD AT 0720 05/01/16 BY L BENFIELD CRITICAL RESULT CALLED TO, READ BACK BY AND VERIFIED WITH: C BALL,PHARMD AT 0935 05/01/16 BY L BENFIELD BCID REQUESTED REPEAT BY DR VAN DAMM    Culture GRAM POSITIVE COCCI  Final   Report Status PENDING  Incomplete  Blood Culture ID Panel (Reflexed)     Status: Abnormal   Collection Time: 04/30/16  12:38 PM  Result Value Ref Range Status   Enterococcus species DETECTED (A) NOT DETECTED Final    Comment: CRITICAL RESULT CALLED TO, READ BACK BY AND VERIFIED WITH: C BALL,PHARMD AT 0935 05/01/16 BY L BENFIELD    Vancomycin resistance NOT DETECTED NOT DETECTED Final   Listeria monocytogenes NOT DETECTED NOT DETECTED Final   Staphylococcus species DETECTED (A) NOT DETECTED Final    Comment: CRITICAL RESULT CALLED TO, READ BACK BY AND VERIFIED WITH: C BALL,PHARMD AT 0935 05/01/16 BY L BENFIELD    Staphylococcus aureus NOT DETECTED NOT DETECTED Final   Methicillin resistance DETECTED (A) NOT DETECTED Final    Comment: CRITICAL RESULT CALLED TO, READ BACK BY AND VERIFIED WITH: C BALL,PHARMD AT 0935 05/01/16 BY L BENFIELD    Streptococcus species NOT DETECTED NOT DETECTED Final   Streptococcus agalactiae NOT DETECTED NOT DETECTED Final   Streptococcus pneumoniae NOT DETECTED NOT DETECTED Final   Streptococcus pyogenes NOT DETECTED NOT DETECTED Final   Acinetobacter baumannii NOT DETECTED NOT DETECTED Final   Enterobacteriaceae species NOT DETECTED NOT DETECTED Final   Enterobacter cloacae complex NOT DETECTED NOT DETECTED Final   Escherichia coli NOT DETECTED NOT DETECTED Final   Klebsiella oxytoca NOT DETECTED NOT DETECTED Final   Klebsiella pneumoniae NOT DETECTED NOT DETECTED Final   Proteus species NOT DETECTED NOT DETECTED Final   Serratia marcescens NOT DETECTED NOT DETECTED Final   Haemophilus influenzae NOT DETECTED NOT DETECTED Final   Neisseria meningitidis NOT DETECTED NOT DETECTED Final   Pseudomonas aeruginosa NOT DETECTED NOT DETECTED Final   Candida albicans NOT DETECTED NOT DETECTED Final   Candida glabrata NOT DETECTED NOT DETECTED Final   Candida krusei NOT DETECTED NOT DETECTED Final   Candida parapsilosis NOT DETECTED NOT DETECTED Final   Candida tropicalis NOT DETECTED NOT DETECTED Final  Culture, blood (Routine X 2) w Reflex to ID Panel     Status: None  (Preliminary result)   Collection Time: 04/30/16 12:56 PM  Result Value Ref Range Status   Specimen Description BLOOD RIGHT ARM  Final   Special Requests BOTTLES DRAWN AEROBIC AND ANAEROBIC 10CC  Final   Culture NO GROWTH 2 DAYS  Final   Report Status PENDING  Incomplete    Studies/Results: Dg Chest Port 1 View  Result Date: 05/02/2016 CLINICAL DATA:  Check endotracheal tube placement EXAM: PORTABLE CHEST 1 VIEW COMPARISON:  05/01/2016 FINDINGS: Cardiac shadow is within normal limits. Endotracheal tube is again seen but lies just above the carina. This could be withdrawn 2-3 cm. A right-sided PICC line is again noted in satisfactory position. The overall inspiratory effort is poor. Mild bibasilar atelectatic changes are seen. No bony abnormality is noted. IMPRESSION: Endotracheal tube at the level of the carina. This should be withdrawn 2-3 cm. These results will be called to the ordering clinician or representative by the Radiologist Assistant, and communication documented in the PACS or zVision Dashboard. Electronically Signed   By: Inez Catalina M.D.   On: 05/02/2016 07:38   Dg Chest Port 1 View  Result Date: 05/01/2016 CLINICAL DATA:  Shortness of Breath EXAM: PORTABLE CHEST 1 VIEW COMPARISON:  04/30/2016 FINDINGS: Cardiac shadow is stable. Postoperative changes are again seen. A right-sided PICC line and endotracheal tube are again noted and stable. The lungs are well-aerated without focal infiltrate. The previously seen right basilar atelectasis has resolved in the interval. IMPRESSION: No acute abnormality noted. Electronically Signed   By: Inez Catalina M.D.   On: 05/01/2016 08:56   Dg Foot 2 Views Right  Result Date: 04/30/2016 CLINICAL DATA:  Diabetic foot ulcer EXAM: RIGHT FOOT - 2 VIEW COMPARISON:  None. FINDINGS: Two views of the right foot submitted. No acute fracture or subluxation. The patient is status post amputation of metatarsals and toes. Tiny plantar spur of calcaneus. Mild  dorsal spurring tarsal region. Mild degenerative changes tibiotalar joint. No definite evidence of bone destruction to suggest osteomyelitis. Atherosclerotic vascular calcifications are noted. IMPRESSION: No acute fracture or subluxation. The patient is status post amputation of metatarsals and toes. Tiny plantar spur of calcaneus. Mild dorsal spurring tarsal region. Mild degenerative changes tibiotalar joint. No definite evidence of bone destruction to suggest osteomyelitis. Atherosclerotic vascular calcifications are noted. Electronically Signed   By: Lahoma Crocker M.D.   On: 04/30/2016 18:05   Micro:  10/21 blood cx: 1 of 2 sets showing GPC, but BCID suggesting MRSE and enterococcus 10/21trach aspirate: showing MRSA  Assessment/Plan:   Active Problems:   Sepsis ([place])   Acute respiratory failure with hypoxia ([facility])   Endotracheally intubated   History of ETT   Hyperkalemia   RUQ pain   Transaminitis   FUO (fever of unknown origin)   Diabetic foot ulcer ([place])   Coag negative Staphylococcus bacteremia   Bacteremia due to Enterococcus    ADVAITH LAMARQUE is a 60 y.o. male with  esident with hx CHF, CAD s/p remote stent, DM, HTN, Aflutter s/p ablation WITH PACEMAKER  on coumadin, severe PVD s/p R BKA, being treated for possible infection at SNF with rocephin. Presented 10/12 from SNF after being found unresponsive with agonal breaths sp broad-spectrum antibiotics of vancomycin and cefepime narrowed to vancomycin after MRSA isolated from his resp cultures still on ventilator and still some low-grade temperatures. He is now growing an Enterococcus and MR Coag neg staph from 1/2 cultures on 10/21  #1 FUO: still difficult to identify source be it pneumonia vs. DFU. His + blood cx are not impressive since it is 2 different isolates from 1 set which is suggestive of poor collection/contaminant. Currently, continue on   Vancomycin and AMP pending sensis on the enterococcus. We will see if enterococcus  is isolated again, we would get a TEE given ICD and concern for endocarditis.  #2 Bacteremia: see above  #  3: MRSA pneumonia = would treat for a total of 14 days with vancomcyin  #3 DFU: if no other source of fever is found, we will look at his foot as a potential source and could get CT with contrast    LOS: 11 days   Alannie Amodio 05/02/2016, 4:15 PM

## 2016-05-02 NOTE — Progress Notes (Addendum)
Nutrition Follow-up / Consult  DOCUMENTATION CODES:   Obesity unspecified  INTERVENTION:    Continue Vital High Protein at 50 ml/h.  Add Prostat 30 ml TID  Total intake 1500 kcal, 150 gm protein, 1003 ml free water daily.  NUTRITION DIAGNOSIS:   Inadequate oral intake related to inability to eat as evidenced by NPO status.  Ongoing  GOAL:   Provide needs based on ASPEN/SCCM guidelines  Progressing  MONITOR:   Vent status, Labs, Weight trends, TF tolerance, Skin, I & O's  REASON FOR ASSESSMENT:   Consult Wound healing  ASSESSMENT:   60 yo male SNF resident with hx CHF, CAD s/p remote stent, DM, HTN, A flutter s/p ablation on coumadin, severe PVD s/p L BKA, being treated for unknown infection at SNF with rocephin.  Presented 10/12 from SNF after being found unresponsive with agonal breaths. Required intubation.  RD following patient for TF management. Received consult for wound healing. Patient with a chronic, non-healing full thickness wound to his right foot. Ongoing fever, ID following. Abdominal ultrasound on 10/19 showed fatty liver. Patient is currently receiving Vital High Protein via PEG at 50 ml/h (1200 ml/day) to provide 1200 kcals, 105 gm protein, 1003 ml free water daily.  Discussed patient in ICU rounds and with RN today.  Patient is currently intubated on ventilator support MV: 12.7 L/min Temp (24hrs), Avg:101.5 F (38.6 C), Min:101.1 F (38.4 C), Max:101.8 F (38.8 C)  Labs reviewed: sodium elevated, potassium low. CBG's: [phone] Medications reviewed and include Lasix, insulin, KCl.   Diet Order:  Diet NPO time specified  Skin:  Wound (see comment) (open wound to R foot; DTI to sacrum)  Last BM:  10/22  Height:   Ht Readings from Last 1 Encounters:  04/19/2016 5\' 10"  (1.778 m)    Weight:   Wt Readings from Last 1 Encounters:  05/02/16 207 lb 3.7 oz (94 kg)    Ideal Body Weight:  75.5 kg  BMI:  Body mass index is 29.73  kg/m.  Estimated Nutritional Needs:   Kcal:  [id number]  Protein:  >/= 150 gm  Fluid:  2 L  EDUCATION NEEDS:   No education needs identified at this time  Joaquin CourtsKimberly Delila Kuklinski, RD, LDN, CNSC Pager [phone] After Hours Pager [phone]

## 2016-05-02 NOTE — Consult Note (Addendum)
WOC Nurse wound consult note Reason for Consult:  sacral pressure injury Wound type: Pressure Injury, evolving DTI Pressure Ulcer POA: Yes Measurement: 7 cm x 6 cm Wound bed:20% darkened purple tissue, 10% red tissue, 70% yellow slough.  Drainage:Small serosangenous Periwound: Fragile, intact skin Dressing procedure/placement/frequency: Continue Santyl with saline moistened gauze daily, covered with dry gauze. Pt is on a low airloss bed to reduce pressure. Please re-consult if further assistance is needed.  Thank-you,  Cammie Mcgeeawn Makaila Windle MSN, RN, CWOCN, [place], CNS [phone]

## 2016-05-02 NOTE — Progress Notes (Signed)
Interim Progress Note:   Called [facility] (SNF) to ask about Ceftriaxone patient was on prior to admission to the hospital. SNF staff reports there is no clear diagnosis but was for fever spikes. Reports blood cultures sent and CXR was obtained. Per staff, reports patient only received 1 day of IM CTX and had a PICC line placed.   Palma HolterKanishka G Ayren Zumbro, MD PGY 2 Family Medicine

## 2016-05-02 NOTE — Progress Notes (Signed)
Addendum: Follow-up anti-Xa level remains therapeutic at 0.55.  Plan: -Continue heparin 2000 units/hr -Daily CBC, anti-Xa level, S/Sx bleeding  ANTICOAGULATION CONSULT NOTE  Pharmacy Consult for Heparin Indication: atrial fibrillation  Allergies  Allergen Reactions  . Niaspan [Niacin Er] Hives and Other (See Comments)    Reaction:  Facial redness/hotness     Patient Measurements: Height: 5\' 10"  (177.8 cm) Weight: 207 lb 3.7 oz (94 kg) IBW/kg (Calculated) : 73  Heparin Wt: 97 kg  Vital Signs: BP: 97/67 (10/23 0309) Pulse Rate: 74 (10/23 0309)  Labs:  Recent Labs  04/29/16 1120  04/30/16 0443 04/30/16 1755 05/01/16 0321 05/01/16 1140 05/01/16 2040 05/02/16 0415  HGB  --   < > 7.9*  --  8.2*  --   --  8.4*  HCT  --   --  26.5*  --  27.5*  --   --  28.8*  PLT  --   --  272  --  270  --   --  273  HEPARINUNFRC  --   < >  --   --  0.96* 0.82* 0.78* 0.50  CREATININE  --   < > 1.04 0.98 1.03  --   --  1.14  CKTOTAL 136  --   --   --   --   --   --   --   < > = values in this interval not displayed.  Estimated Creatinine Clearance: 77.4 mL/min (by C-G formula based on SCr of 1.14 mg/dL).  Assessment: Clayton [name] with atrial flutter on warfarin PTA now on heparin per pharmacy dosing. Abdominal US negative for a bleed. Anti-Xa level now therapeutic at 0.50 after dose reduction. No issues with bleeding noted, Hgb/plt stable.   Goal of Therapy: Heparin level 0.3 - 0.7 units/mL Monitor platelets by anticoagulation protocol: Yes  Plan:  -Continue heparin 2000 units/hr -Follow-up 6-hr anti-Xa level -Monitor daily anti-Xa, CBC, S/Sx bleeding  Fredonia HighlandMichael Darnesha Diloreto, PharmD PGY-1 Pharmacy Resident Pager: [phone] 05/02/2016

## 2016-05-03 ENCOUNTER — Inpatient Hospital Stay (HOSPITAL_COMMUNITY): Payer: Medicare HMO

## 2016-05-03 LAB — BLOOD GAS, ARTERIAL
ACID-BASE EXCESS: 3.5 mmol/L — AB (ref 0.0–2.0)
Bicarbonate: 25.8 mmol/L (ref 20.0–28.0)
Drawn by: 345601
FIO2: 0.4
O2 SAT: 99.5 %
PCO2 ART: 29.9 mmHg — AB (ref 32.0–48.0)
PEEP: 5 cmH2O
Patient temperature: 101.3
RATE: 22 resp/min
VT: 580 mL
pH, Arterial: 7.551 — ABNORMAL HIGH (ref 7.350–7.450)
pO2, Arterial: 164 mmHg — ABNORMAL HIGH (ref 83.0–108.0)

## 2016-05-03 LAB — BASIC METABOLIC PANEL
ANION GAP: 10 (ref 5–15)
BUN: 64 mg/dL — AB (ref 6–20)
CALCIUM: 8.6 mg/dL — AB (ref 8.9–10.3)
CO2: 27 mmol/L (ref 22–32)
Chloride: 116 mmol/L — ABNORMAL HIGH (ref 101–111)
Creatinine, Ser: 1.39 mg/dL — ABNORMAL HIGH (ref 0.61–1.24)
GFR calc Af Amer: >60 mL/min (ref >60)
GFR calc non Af Amer: 53 mL/min — ABNORMAL LOW (ref >60)
GLUCOSE: 254 mg/dL — AB (ref 65–99)
Potassium: 3.6 mmol/L (ref 3.5–5.1)
Sodium: 153 mmol/L — ABNORMAL HIGH (ref 135–145)

## 2016-05-03 LAB — RENAL FUNCTION PANEL
ANION GAP: 9 (ref 5–15)
Albumin: 2.2 g/dL — ABNORMAL LOW (ref 3.5–5.0)
BUN: 52 mg/dL — ABNORMAL HIGH (ref 6–20)
CALCIUM: 8.1 mg/dL — AB (ref 8.9–10.3)
CHLORIDE: 116 mmol/L — AB (ref 101–111)
CO2: 26 mmol/L (ref 22–32)
Creatinine, Ser: 1.21 mg/dL (ref 0.61–1.24)
GFR calc non Af Amer: >60 mL/min (ref >60)
Glucose, Bld: 227 mg/dL — ABNORMAL HIGH (ref 65–99)
Phosphorus: 3 mg/dL (ref 2.5–4.6)
Potassium: 3.5 mmol/L (ref 3.5–5.1)
Sodium: 151 mmol/L — ABNORMAL HIGH (ref 135–145)

## 2016-05-03 LAB — CBC
HCT: 26.9 % — ABNORMAL LOW (ref 39.0–52.0)
HEMOGLOBIN: 7.8 g/dL — AB (ref 13.0–17.0)
MCH: 27.3 pg (ref 26.0–34.0)
MCHC: 29 g/dL — ABNORMAL LOW (ref 30.0–36.0)
MCV: 94.1 fL (ref 78.0–100.0)
Platelets: 235 10*3/uL (ref 150–400)
RBC: 2.86 MIL/uL — AB (ref 4.22–5.81)
RDW: 18.9 % — ABNORMAL HIGH (ref 11.5–15.5)
WBC: 9.4 10*3/uL (ref 4.0–10.5)

## 2016-05-03 LAB — GLUCOSE, CAPILLARY
GLUCOSE-CAPILLARY: 200 mg/dL — AB (ref 65–99)
GLUCOSE-CAPILLARY: 198 mg/dL — AB (ref 65–99)
GLUCOSE-CAPILLARY: 233 mg/dL — AB (ref 65–99)
Glucose-Capillary: 216 mg/dL — ABNORMAL HIGH (ref 65–99)
Glucose-Capillary: 219 mg/dL — ABNORMAL HIGH (ref 65–99)
Glucose-Capillary: 299 mg/dL — ABNORMAL HIGH (ref 65–99)

## 2016-05-03 LAB — PROCALCITONIN: PROCALCITONIN: 0.24 ng/mL

## 2016-05-03 LAB — MAGNESIUM: Magnesium: 2.3 mg/dL (ref 1.7–2.4)

## 2016-05-03 LAB — HEPARIN LEVEL (UNFRACTIONATED): HEPARIN UNFRACTIONATED: 0.64 [IU]/mL (ref 0.30–0.70)

## 2016-05-03 MED ORDER — POTASSIUM CHLORIDE 20 MEQ/15ML (10%) PO SOLN
40.0000 meq | Freq: Three times a day (TID) | ORAL | Status: AC
  Administered 2016-05-03 (×2): 40 meq
  Filled 2016-05-03 (×3): qty 30

## 2016-05-03 MED ORDER — DILTIAZEM 12 MG/ML ORAL SUSPENSION
15.0000 mg | Freq: Four times a day (QID) | ORAL | Status: DC
  Administered 2016-05-03: 15 mg
  Filled 2016-05-03 (×3): qty 3

## 2016-05-03 MED ORDER — CLONAZEPAM 0.5 MG PO TABS
0.5000 mg | ORAL_TABLET | Freq: Two times a day (BID) | ORAL | Status: DC
  Administered 2016-05-03 – 2016-05-04 (×3): 0.5 mg
  Filled 2016-05-03 (×3): qty 1

## 2016-05-03 MED ORDER — METOPROLOL TARTRATE 5 MG/5ML IV SOLN
5.0000 mg | Freq: Once | INTRAVENOUS | Status: AC
  Administered 2016-05-03: 5 mg via INTRAVENOUS
  Filled 2016-05-03: qty 5

## 2016-05-03 MED ORDER — FUROSEMIDE 10 MG/ML IJ SOLN
80.0000 mg | Freq: Four times a day (QID) | INTRAMUSCULAR | Status: AC
Start: 2016-05-03 — End: 2016-05-03
  Administered 2016-05-03 (×3): 80 mg via INTRAVENOUS
  Filled 2016-05-03 (×5): qty 8

## 2016-05-03 MED ORDER — INSULIN GLARGINE 100 UNIT/ML ~~LOC~~ SOLN
30.0000 [IU] | SUBCUTANEOUS | Status: DC
  Administered 2016-05-04: 30 [IU] via SUBCUTANEOUS
  Filled 2016-05-03: qty 0.3

## 2016-05-03 MED ORDER — AMIODARONE LOAD VIA INFUSION
150.0000 mg | Freq: Once | INTRAVENOUS | Status: AC
  Administered 2016-05-04: 150 mg via INTRAVENOUS
  Filled 2016-05-03: qty 83.34

## 2016-05-03 MED ORDER — METOLAZONE 5 MG PO TABS
5.0000 mg | ORAL_TABLET | Freq: Every day | ORAL | Status: AC
  Administered 2016-05-03: 5 mg via ORAL
  Filled 2016-05-03: qty 1

## 2016-05-03 MED ORDER — AMIODARONE HCL IN DEXTROSE 360-4.14 MG/200ML-% IV SOLN
30.0000 mg/h | INTRAVENOUS | Status: DC
  Filled 2016-05-03: qty 200

## 2016-05-03 MED ORDER — INSULIN ASPART 100 UNIT/ML ~~LOC~~ SOLN
5.0000 [IU] | SUBCUTANEOUS | Status: DC
  Administered 2016-05-03 – 2016-05-06 (×17): 5 [IU] via SUBCUTANEOUS

## 2016-05-03 MED ORDER — POTASSIUM CHLORIDE 20 MEQ/15ML (10%) PO SOLN
40.0000 meq | Freq: Once | ORAL | Status: AC
  Administered 2016-05-03: 40 meq

## 2016-05-03 MED ORDER — AMIODARONE HCL IN DEXTROSE 360-4.14 MG/200ML-% IV SOLN
60.0000 mg/h | INTRAVENOUS | Status: DC
Start: 2016-05-04 — End: 2016-05-04
  Administered 2016-05-04 (×2): 60 mg/h via INTRAVENOUS
  Filled 2016-05-03 (×2): qty 200

## 2016-05-03 MED ORDER — INSULIN GLARGINE 100 UNIT/ML ~~LOC~~ SOLN
10.0000 [IU] | SUBCUTANEOUS | Status: AC
  Administered 2016-05-03: 10 [IU] via SUBCUTANEOUS
  Filled 2016-05-03: qty 0.1

## 2016-05-03 NOTE — Progress Notes (Signed)
ANTICOAGULATION CONSULT NOTE  Pharmacy Consult for Heparin Indication: atrial fibrillation  Allergies  Allergen Reactions  . Niaspan [Niacin Er] Hives and Other (See Comments)    Reaction:  Facial redness/hotness     Patient Measurements: Height: 5\' 10"  (177.8 cm) Weight: 207 lb 3.7 oz (94 kg) IBW/kg (Calculated) : 73  Heparin Wt: 97 kg  Vital Signs: Temp: 100.9 F (38.3 C) (10/24 0700) BP: 109/66 (10/24 0700) Pulse Rate: 65 (10/24 0700)  Labs:  Recent Labs  05/01/16 0321  05/02/16 0415 05/02/16 1030 05/02/16 1455 05/03/16 0420  HGB 8.2*  --  8.4*  --   --  7.8*  HCT 27.5*  --  28.8*  --   --  26.9*  PLT 270  --  273  --   --  235  HEPARINUNFRC 0.96*  < > 0.50 0.55  --  0.64  CREATININE 1.03  --  1.14  --  1.21 1.21  < > = values in this interval not displayed.  Estimated Creatinine Clearance: 72.9 mL/min (by C-G formula based on SCr of 1.21 mg/dL).  Assessment: 60 year old male with atrial flutter on warfarin PTA now on heparin per pharmacy dosing. Abdominal US negative for a bleed. Heparin level therapeutic x3 on 2000units/hr. No issues with bleeding noted, Hgb decreased 8.4 >> 7.8, platelets stable.   Goal of Therapy: Heparin level 0.3 - 0.7 units/mL Monitor platelets by anticoagulation protocol: Yes  Plan:  - Continue heparin 2000 units/hr - Monitor daily heparin level, CBC, S/Sx bleeding  Dannial MonarchHailey Makenzye Troutman, PharmD Candidate  05/03/2016

## 2016-05-03 NOTE — Progress Notes (Signed)
Blood cultures from 10/21 are likely contaminant. Would recommend to discontinue the ampicillin and continue with vancomycin for his MRSA pneumonia

## 2016-05-03 NOTE — Progress Notes (Signed)
Interim Progress Note:  Patient continues to have elevated HR despite IV lopressor and per tube Diltiazem x 1. Blood pressure is in the low 100s for SBP. Patient is being diuresed in hopes that diuresis will help with passing SBT/successfull extubation later this week. Patient already received last ordered dose of IV Lasix. Repeat BMP this evening showed K 3.7 with increased creatinine (repleted potassium this evening). May have slightly over diuresed patient. Discussed with Dr. Arsenio LoaderSommer from [facility]. BP in low normal and patient's BP will likely not tolerate Cardizem drip. EKG with QTc at 453. Will start Amiodarone drip for now and discontinue the Cardizem per tube order.  Will also obtain magnesium level. Will continue to monitor.   Palma HolterKanishka G Gunadasa, MD PGY 2 Family Medicine

## 2016-05-03 NOTE — Care Management Note (Signed)
Case Management Note  Patient Details  Name: Clayton [name] MRN: [id number] Date of Birth: 06-07-1956  Subjective/Objective:    Pt admitted with sepsis                Action/Plan:  Per H&P pt is from SNF or Assisted Living.  Pt is intubated without family at bedside.  CM unable to reach next of kin via phone.  CSW consulted 07/30/2015 by attending.  CM will verbally communicate with CSW.  CM will continue to follow for discharge needs   Expected Discharge Date:  05/10/16               Expected Discharge Plan:  Skilled Nursing Facility  In-House Referral:  Clinical Social Work  Discharge planning Services  CM Consult  Post Acute Care Choice:    Choice offered to:     DME Arranged:    DME Agency:     HH Arranged:    HH Agency:     Status of Service:  In process, will continue to follow  If discussed at Long Length of Stay Meetings, dates discussed:    Additional Comments: 05/03/2016  CM informed by unit assigned CSW [facility] - plan is for pt to discharge back to facility when medically stable.  Previous communication regarding pre admit living status was provided by covering CSW.  05/02/16 Pt remains intubated  04/27/16 CM consulted with CSW and was informed  that pt is [facility] from [facility] with wife.  Pt remains intubated and will likely be SNF consult - pt previously recommended for SNF but declined on prev admission. Cherylann ParrClaxton, Sudie Bandel S, RN 05/03/2016, 9:14 AM

## 2016-05-03 NOTE — Progress Notes (Signed)
Inpatient Diabetes Program Recommendations  AACE/ADA: New Consensus Statement on Inpatient Glycemic Control (2015)  Target Ranges:  Prepandial:   less than 140 mg/dL      Peak postprandial:   less than 180 mg/dL (1-2 hours)      Critically ill patients:  140 - 180 mg/dL   Lab Results  Component Value Date   GLUCAP 200 (H) 05/03/2016   HGBA1C 7.0 (H) 05/01/2016    Review of Glycemic Control:  Results for Clayton [name], Clayton [name] (MRN [id number]) as of 05/03/2016 11:00  Ref. Range 05/02/2016 23:39 05/03/2016 03:47 05/03/2016 07:40  Glucose-Capillary Latest Ref Range: 65 - 99 mg/dL 782299 (H) 956216 (H) 213200 (H)   Inpatient Diabetes Program Recommendations:    Note tube feed coverage increased to 5 units q 4 hours.  If CBG's remain >200 mg/dL, consider also increasing Lantus to 30 units daily.   Thanks, Beryl MeagerJenny Sybel Standish, RN, BC-ADM Inpatient Diabetes Coordinator Pager [phone] (8a-5p)

## 2016-05-03 NOTE — Progress Notes (Signed)
PULMONARY / CRITICAL CARE MEDICINE   Name: Clayton [name] MRN: [id number] DOB: [birth year]    ADMISSION DATE:  04/11/2016  REFERRING MD:  Alvino Chapel (EDP)   CHIEF COMPLAINT:  Sepsis, respiratory failure   HISTORY OF PRESENT ILLNESS:   60yo male SNF resident with hx CHF, CAD s/p remote stent, DM, HTN, Aflutter s/p ablation on coumadin, severe PVD s/p R BKA, being treated for unknown infection at SNF with rocephin.  Presented 10/12 from SNF after being found unresponsive with agonal breaths.  Bagged by EMS en route and intubated on arrival to ER.  In ER was febrile to 104, hypotensive with SBP 70's.  Labs notable for AKI with K 7.1, glucose 500, WBC 36, lactate 3.6.  PCCM consulted for admit.   SUBJECTIVE: No events overnight. Febrile to 101. HR within normal limits. BP overall stable. Cardizem held due to not meeting parameters (HR lower end). Yesterday received Lasix '40mg'$  IV x 3 doses (and '80mg'$  x 1) with 3L out. No pain per patient today.   REVIEW OF SYSTEMS:  Answers questions with nodding/shanking of head. No pain.   VITAL SIGNS: BP 109/66   Pulse 65   Temp (!) 100.9 F (38.3 C)   Resp 20   Ht '5\' 10"'$  (1.778 m)   Wt 207 lb 3.7 oz (94 kg)   SpO2 100%   BMI 29.73 kg/m   HEMODYNAMICS:    VENTILATOR SETTINGS: Vent Mode: PRVC FiO2 (%):  [40 %] 40 % Set Rate:  [22 bmp] 22 bmp Vt Set:  [580 mL] 580 mL PEEP:  [5 cmH20] 5 cmH20 Pressure Support:  [15 cmH20] 15 cmH20 Plateau Pressure:  [19 cmH20-24 cmH20] 20 cmH20  INTAKE / OUTPUT: I/O last 3 completed shifts: In: 4313.2 [I.V.:1513.2; Other:150; NG/GT:2050; IV Piggyback:600] Out: 4600 [EOFHQ:1975]  PHYSICAL EXAMINATION:  General:  NAD  Neuro:  Answers questions with nodding or shaking of head  HEENT:  ETT in place. No scleral icterus or injection. Cardiovascular: regular rhythm. No appreciable JVD. Lungs: coarse breath sounds. Symmetric chest rise on ventilator.  Abdomen:  Normal bowel bowel sounds. PEG tube in place without  surrounding erythema, Nontender, + BS Musculoskeletal:  Warm and dry. 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 Right foot is wrapped in soft support.   LABS:  BMET  Recent Labs Lab 05/02/16 0415 05/02/16 1455 05/03/16 0420  NA 154* 153* 151*  K 3.2* 3.2* 3.5  CL 115* 117* 116*  CO2 '29 28 26  '$ BUN 40* 43* 52*  CREATININE 1.14 1.21 1.21  GLUCOSE 130* 208* 227*    Electrolytes  Recent Labs Lab 05/01/16 0321 05/02/16 0415 05/02/16 1455 05/03/16 0420  CALCIUM 8.4* 8.3* 8.1* 8.1*  MG 1.9 2.4  --  2.3  PHOS 3.5  3.6 3.4  --  3.0    CBC  Recent Labs Lab 05/01/16 0321 05/02/16 0415 05/03/16 0420  WBC 11.3* 10.8* 9.4  HGB 8.2* 8.4* 7.8*  HCT 27.5* 28.8* 26.9*  PLT 270 273 235    Coag's No results for input(s): APTT, INR in the last 168 hours.  Sepsis Markers  Recent Labs Lab 04/29/16 1000 04/29/16 1120 05/01/16 0321 05/03/16 0420  LATICACIDVEN 1.8  --   --   --   PROCALCITON  --  0.71 0.34 0.24    ABG  Recent Labs Lab 05/03/16 0402  PHART 7.551*  PCO2ART 29.9*  PO2ART 164*    Liver Enzymes  Recent Labs Lab 04/27/16 0842  04/29/16 1120  05/01/16 0321 05/02/16  1950 05/03/16 0420  AST 80*  --  52*  --   --   --   --   ALT 58  --  54  --   --   --   --   ALKPHOS 170*  --  142*  --   --   --   --   BILITOT 1.1  --  0.9  --   --   --   --   ALBUMIN 1.7*  < > 1.8*  < > 2.1* 2.3* 2.2*  < > = values in this interval not displayed.  Cardiac Enzymes No results for input(s): TROPONINI, PROBNP in the last 168 hours.  Glucose  Recent Labs Lab 05/02/16 0953 05/02/16 1118 05/02/16 1558 05/02/16 1951 05/02/16 2339 05/03/16 0347  GLUCAP 158* 189* 197* 255* 299* 216*    Imaging No results found.   STUDIES:  Renal U/S 10/12:  No abnormality or hydronephrosis.  TTE 10/13:  Difficult study. Mild LVH w/ EF 55%. Possible akinesis apical myocardium without obvious thrombus.Moderate aortic stenosis w/ mild mitral stenosis. RV poorly  visualized. CT abdomen 10/15: no evidence for retroperitoneal hemorrhage. RLL central airspace disease compatible with aspiration and/or PNA CXR: 10/17: unchanged from 10/16. No consolidation. Final read pending.  RUQ [place] 10/18: Cholelithiasis without sonographic evidence acute cholecystitis. A hepatobiliary scintigraphy may provide better evaluation of the gallbladder if an acute cholecystitis is clinically suspected. Fatty Liver.  CXR 10/19: No pulmonary edema. Stable endotracheal tube position. Hazy right infrahilar and right base atelectasis or infiltrate CXR 10/22: no acute abnormality  X-ray R Foot 10/21: no osteomyelitis   HIV 10/22: non reactive  Hep C 10/22: negative Hep B 10/22: pending   MICROBIOLOGY: MRSA PCR 10/12:  Negative Blood Ctx x2 10/12 >> 1/2 Coag Neg Staph; 1/2 NG x 5 days  Urine Ctx 10/12:  Negative  Tracheal Asp Ctx 10/13 >> MRSA Respiratory Viral Panel PCR 10/13:  Negative  Blood Cx 10/21: PCR enterococcus, coag neg staph MR. Cx- gram positive cocci in one bottle; 2nd bottle No growth x 2 days Tracheal Aspirate cx 10/21: MRSA Urine Culture 10/21: No growth final   ANTIBIOTICS: Zosyn 10/12 x1 dose; 10/16 >>10/17 Vancomycin 10/12 >>10/18 Cefepime 10/12 >> 10/16 Vancomycin 10/22 >> Ampicillin 10/22 >>  SIGNIFICANT EVENTS: 10/12 - Admit & intubation in ED 10/14 - switched from Levo to Neo due to intermittent tachycardia  10/15 - off pressor 10/17 - Failed wean 10/18 - failed wean started lasix; Completed Vanc for PNA  10/19 - Failed wean  10/20 - continues to have fevers despite completing abx therapy for PNA. ID consulted.  ESR and CRP elevated.  10/22 - possible bacteremia with Enterococcus/MRSA on PCA with pending cultures. Started on Vanc and Ampicillin; Lasix switched from TID to BID.    LINES/TUBES: OETT 7.5 10/12 >>  RUE PICC (SNF) 10/11 >>   Foley 10/12 >> PEG >> PIV x1  ASSESSMENT / PLAN:  Mr. Rijo is a  60 y.o. male with PMH CHF, CAD s/p  remote stent, DM, HTN, Aflutter s/p ablation on anticoagulation, severe PVD s/p R BKA. Presenting with septic shock likely from MRSA PNA. Is on Heparin for history of a flutter.  Attempted SBT but did not pass; Possibly fluid overloaded therefore diuresing wtih Lasix. Continues to have fevers despite completing antibiotic therapy for MRSA PNA. He was found to have possible bacteremia with Enterococcus/coagulase neg MR staph on PCR in 1/2 bottles with pending cultures from 10/22 and started on  Vancomycin and Ampicillin. His repeat tracheal aspirate culture grew MRSA again. Procalcitonin is down-trending.   INFECTIOUS A: Severe Sepsis, resolved RLL MRSA PNA: tracheal aspirate culture with MRSA which was treated but repeat cx grew MRSA. Low Grade Temperatures: elevated CRP/ESR with Possible bacteremia with Enterococcus/ MR staph (not aureus) on PCR in 1/2 bottles with pending cultures from 10/22. More likely a contaminant   P:   S/p Vancomycin for 7 days  S/p Cefepime and Zosyn Restarted Vanc 10/22 > Ampicillin (10/22 >) ID Consulted:  Continue Vanc and Ampicillin pending sensitivity of enterococcus. Positive blood culture not impressive and more suggestive of contaminant; would treat for total of 14 days of Vancomycin. If no other source of fever found, could get CT  Hepatitis Panel pending  PULMONARY A: Acute Hypoxic Respiratory Failure - likely due to PNA Respiratory Alkalosis P:   Full Vent Support  Daily PS Wean & SBT Intermittent CXR & ABG Albuterol nebs prn  CARDIOVASCULAR A: Shock - Likely septic. Resolved and off presors.  Elevated Troponin I - Likely demand ischemia. Echo was difficult to interpret. Questionable apical akinesis. Aortic Stenosis - Seen on poor quality TTE. H/O CAD w/ Stent H/O CHF - Dilated LV w/ decreased function November 2016. H/O A Flutter - S/P ablation, but had intermittent episodes while hospitalized   P:  Cardizem '15mg'$  q 6 hours  Heparin drip   Continuous telemetry monitoring Vitals per unit protocol ASA '81mg'$  VT daily Holding home lasix, metoprolol, lisinopril, & eliquis   RENAL A: Acute Renal Failure - Resolved. Cr stable since starting diuresis. Had renal US earlier in hospital stay which was unremarkable. Hypernatremia - improving  Hypophosphatemia - Resolved Hypokalemia - resolved but 3.5 this AM  Hypomagnesemia - resolved Lactic Acidosis - Resolved. AGMA - Resolved.   P:   Monitoring UOP with Foley Trending Electrolytes and Renal function Free water 250cc q 4 hrs   GASTROINTESTINAL A: Loose stools: c diff negative  RUQ tenderness : RUQ [place] with cholelithiasis without sonographic evidence of cholecystitis. Tenderness resolved.  RLQ Tenderness: Resolved; initially reported by patient  P:   NPO  Tube feeds  Protonix VT daily   HEMATOLOGIC A: Leukocytosis -  resolved; in the setting of pneumonia   Anemia - stable hgb. S/p 1 unit pRBC 10/16. hgb slightly decreased from yesterday No signs of active bleeding  Coagulopathy - resolved; on coumadin at home  P:  Trending cell counts daily w/ CBC Transfuse for Hgb <7.0 Heparin Drip   ENDOCRINE A: H/O DM - A1c 10.6 in June 2017. A1c 7.0 this admission. CBGs in 200s in the setting of tube feeds   P:   SSI resistant Scheduled Novolog 3 units q 4 hrs (only with tube feeds) > increase to 5 units q 4 hours with tube feeds Lantus 20 U  Accu-Checks q4hr  NEUROLOGIC A: Acute Encephalopathy - Multifactorial from hypoxia and probable toxic metabolic. Sedation on Ventilator  P:   RASS goal: 0 to -1 Precedex gtt  Versed IV prn sedation   INTEGUMENT:  A: Sacral Pressure Injury  P: - wound care: santyl with saline moistened gauze daily covered with dry gauze    Smiley Houseman, MD PGY 2 Family Medicine  05/03/2016  7:19 AM  Rush Farmer, M.D. [facility] Pulmonary/Critical Care Medicine. Pager: [phone]. After hours pager: [phone].

## 2016-05-03 NOTE — Progress Notes (Signed)
PULMONARY / CRITICAL CARE MEDICINE   Name: Clayton [name] MRN: [id number] DOB: [birth year]    ADMISSION DATE:  04/22/2016  REFERRING MD:  Alvino Chapel (EDP)   CHIEF COMPLAINT:  Sepsis, respiratory failure   HISTORY OF PRESENT ILLNESS:   60yo male SNF resident with hx CHF, CAD s/p remote stent, DM, HTN, Aflutter s/p ablation on coumadin, severe PVD s/p R BKA, being treated for unknown infection at SNF with rocephin.  Presented 10/12 from SNF after being found unresponsive with agonal breaths.  Bagged by EMS en route and intubated on arrival to ER.  In ER was febrile to 104, hypotensive with SBP 70's.  Labs notable for AKI with K 7.1, glucose 500, WBC 36, lactate 3.6.  PCCM consulted for admit.   SUBJECTIVE: No events overnight. Febrile to 101. HR within normal limits. BP overall stable. Cardizem held due to not meeting parameters (HR lower end). Yesterday received Lasix 63m IV x 3 doses (and 898mx 1) with 3L out. No pain per patient today.   REVIEW OF SYSTEMS:  Answers questions with nodding/shanking of head. No pain.   VITAL SIGNS: BP 109/66   Pulse 65   Temp (!) 100.9 F (38.3 C)   Resp 20   Ht _0  (1.778 m)   Wt 94 kg (207 lb 3.7 oz)   SpO2 100%   BMI 29.73 kg/m   HEMODYNAMICS:    VENTILATOR SETTINGS: Vent Mode: PSV;CPAP FiO2 (%):  [40 %] 40 % Set Rate:  [22 bmp] 22 bmp Vt Set:  [580 mL] 580 mL PEEP:  [5 cmH20] 5 cmH20 Pressure Support:  [10 cmH20] 10 cmH20 Plateau Pressure:  [19 cmH20-24 cmH20] 20 cmH20  INTAKE / OUTPUT: I/O last 3 completed shifts: In: 4313.2 [I.V.:1513.2; Other:150; NG/GT:2050; IV Piggyback:600] Out: 4600 [U[WUJWJ:1914]PHYSICAL EXAMINATION:  General:  NAD  Neuro:  Answers questions with nodding or shaking of head  HEENT:  ETT in place. No scleral icterus or injection. Cardiovascular: regular rhythm. No appreciable JVD. Lungs: coarse breath sounds. Symmetric chest rise on ventilator.  Abdomen:  Normal bowel bowel sounds. PEG tube in place  without surrounding erythema, Nontender, + BS Musculoskeletal:  Warm and dry. 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 Right foot is wrapped in soft support.   LABS:  BMET  Recent Labs Lab 05/02/16 0415 05/02/16 1455 05/03/16 0420  NA 154* 153* 151*  K 3.2* 3.2* 3.5  CL 115* 117* 116*  CO2 _1 BUN 40* 43* 52*  CREATININE 1.14 1.21 1.21  GLUCOSE 130* 208* 227*    Electrolytes  Recent Labs Lab 05/01/16 0321 05/02/16 0415 05/02/16 1455 05/03/16 0420  CALCIUM 8.4* 8.3* 8.1* 8.1*  MG 1.9 2.4  --  2.3  PHOS 3.5  3.6 3.4  --  3.0    CBC  Recent Labs Lab 05/01/16 0321 05/02/16 0415 05/03/16 0420  WBC 11.3* 10.8* 9.4  HGB 8.2* 8.4* 7.8*  HCT 27.5* 28.8* 26.9*  PLT 270 273 235    Coag's No results for input(s): APTT, INR in the last 168 hours.  Sepsis Markers  Recent Labs Lab 04/29/16 1000 04/29/16 1120 05/01/16 0321 05/03/16 0420  LATICACIDVEN 1.8  --   --   --   PROCALCITON  --  0.71 0.34 0.24    ABG  Recent Labs Lab 05/03/16 0402  PHART 7.551*  PCO2ART 29.9*  PO2ART 164*    Liver Enzymes  Recent Labs Lab 04/27/16 0842  04/29/16 1120  05/01/16 0321 05/02/16  0165 05/03/16 0420  AST 80*  --  52*  --   --   --   --   ALT 58  --  54  --   --   --   --   ALKPHOS 170*  --  142*  --   --   --   --   BILITOT 1.1  --  0.9  --   --   --   --   ALBUMIN 1.7*  < > 1.8*  < > 2.1* 2.3* 2.2*  < > = values in this interval not displayed.  Cardiac Enzymes No results for input(s): TROPONINI, PROBNP in the last 168 hours.  Glucose  Recent Labs Lab 05/02/16 1118 05/02/16 1558 05/02/16 1951 05/02/16 2339 05/03/16 0347 05/03/16 0740  GLUCAP 189* 197* 255* 299* 216* 200*    Imaging Dg Chest Port 1 View  Result Date: 05/03/2016 CLINICAL DATA:  Intubation. EXAM: PORTABLE CHEST 1 VIEW COMPARISON:  05/02/2016. FINDINGS: Right PICC line noted with tip projected over the right brachiocephalic region. Endotracheal tube tip in  unchanged position approximately 1.7 cm above the lower portion of the carina. Withdrawal of approximately 2 cm should be considered. Low lung volumes. Mild infiltrate right lung base cannot be excluded. Prior CABG . Heart size stable . No acute bony abnormality . IMPRESSION: 1. Endotracheal tube tip 1.7 cm above the carina. Proximal repositioning of approximately 2 cm should be considered. Right PICC line in unchanged position. 2. Prior CABG.  Heart size stable. 3. Low lung volumes. Mild right base infiltrate cannot be excluded on today's exam . Electronically Signed   By: Marcello Moores  Register   On: 05/03/2016 08:03     STUDIES:  Renal U/S 10/12:  No abnormality or hydronephrosis.  TTE 10/13:  Difficult study. Mild LVH w/ EF 55%. Possible akinesis apical myocardium without obvious thrombus.Moderate aortic stenosis w/ mild mitral stenosis. RV poorly visualized. CT abdomen 10/15: no evidence for retroperitoneal hemorrhage. RLL central airspace disease compatible with aspiration and/or PNA CXR: 10/17: unchanged from 10/16. No consolidation. Final read pending.  RUQ [place] 10/18: Cholelithiasis without sonographic evidence acute cholecystitis. A hepatobiliary scintigraphy may provide better evaluation of the gallbladder if an acute cholecystitis is clinically suspected. Fatty Liver.  CXR 10/19: No pulmonary edema. Stable endotracheal tube position. Hazy right infrahilar and right base atelectasis or infiltrate CXR 10/22: no acute abnormality  X-ray R Foot 10/21: no osteomyelitis   HIV 10/22: non reactive  Hep C 10/22: negative Hep B 10/22: pending   MICROBIOLOGY: MRSA PCR 10/12:  Negative Blood Ctx x2 10/12 >> 1/2 Coag Neg Staph; 1/2 NG x 5 days  Urine Ctx 10/12:  Negative  Tracheal Asp Ctx 10/13 >> MRSA Respiratory Viral Panel PCR 10/13:  Negative  Blood Cx 10/21: PCR enterococcus, coag neg staph MR. Cx- gram positive cocci in one bottle; 2nd bottle No growth x 2 days Tracheal Aspirate cx 10/21:  MRSA Urine Culture 10/21: No growth final   ANTIBIOTICS: Zosyn 10/12 x1 dose; 10/16 >>10/17 Vancomycin 10/12 >>10/18 Cefepime 10/12 >> 10/16 Vancomycin 10/22 >> Ampicillin 10/22 >>  SIGNIFICANT EVENTS: 10/12 - Admit & intubation in ED 10/14 - switched from Levo to Neo due to intermittent tachycardia  10/15 - off pressor 10/17 - Failed wean 10/18 - failed wean started lasix; Completed Vanc for PNA  10/19 - Failed wean  10/20 - continues to have fevers despite completing abx therapy for PNA. ID consulted.  ESR and CRP elevated.  10/22 - possible  bacteremia with Enterococcus/MRSA on PCA with pending cultures. Started on Vanc and Ampicillin; Lasix switched from TID to BID.    LINES/TUBES: OETT 7.5 10/12 >>  RUE PICC (SNF) 10/11 >>   Foley 10/12 >> PEG >> PIV x1  ASSESSMENT / PLAN:  Mr. Helzer is a  60 y.o. male with PMH CHF, CAD s/p remote stent, DM, HTN, Aflutter s/p ablation on anticoagulation, severe PVD s/p R BKA. Presenting with septic shock likely from MRSA PNA. Is on Heparin for history of a flutter.  Attempted SBT but did not pass; Possibly fluid overloaded therefore diuresing wtih Lasix. Continues to have fevers despite completing antibiotic therapy for MRSA PNA. He was found to have possible bacteremia with Enterococcus/coagulase neg MR staph on PCR in 1/2 bottles with pending cultures from 10/22 and started on Vancomycin and Ampicillin. His repeat tracheal aspirate culture grew MRSA again. Procalcitonin is down-trending.   INFECTIOUS A: Severe Sepsis, resolved RLL MRSA PNA: tracheal aspirate culture with MRSA which was treated but repeat cx grew MRSA. Low Grade Temperatures: elevated CRP/ESR with Possible bacteremia with Enterococcus/ MR staph (not aureus) on PCR in 1/2 bottles with pending cultures from 10/22. More likely a contaminant   P:   S/p Vancomycin for 7 days  S/p Cefepime and Zosyn complete Restarted Vanc 10/22 > Ampicillin 10/22 >>>10/24 ID Consulted:   Continue Vanc and Ampicillin pending sensitivity of enterococcus. Positive blood culture not impressive and more suggestive of contaminant; would treat for total of 14 days of Vancomycin. Could likely d/c amp tonight. Hepatitis Panel HCV negative HBV pending  PULMONARY A: Acute Hypoxic Respiratory Failure - likely due to PNA Respiratory Alkalosis P:   Full Vent Support  Daily PS Wean & SBT Intermittent CXR & ABG Albuterol nebs prn Diureses  CARDIOVASCULAR A: Shock - Likely septic. Resolved and off presors.  Elevated Troponin I - Likely demand ischemia. Echo was difficult to interpret. Questionable apical akinesis. Aortic Stenosis - Seen on poor quality TTE. H/O CAD w/ Stent H/O CHF - Dilated LV w/ decreased function November 2016. H/O A Flutter - S/P ablation, but had intermittent episodes while hospitalized   P:  D/C Cardizem Heparin drip  Continuous telemetry monitoring Vitals per unit protocol ASA 61m VT daily Lasix 80 mg IV q6 x3 doses Zaroxolyn 5 mg PO x1  RENAL A: Acute Renal Failure - Resolved. Cr stable since starting diuresis. Had renal [place] in hospital stay which was unremarkable. Hypernatremia - improving  Hypophosphatemia - Resolved Hypokalemia - resolved but 3.5 this AM  Hypomagnesemia - resolved Lactic Acidosis - Resolved. AGMA - Resolved.   P:   Monitoring UOP with Foley Trending Electrolytes and Renal function Free water 250cc q 4 hrs   GASTROINTESTINAL A: Loose stools: c diff negative  RUQ tenderness : RUQ [place] cholelithiasis without sonographic evidence of cholecystitis. Tenderness resolved.  RLQ Tenderness: Resolved; initially reported by patient  P:   NPO  Tube feeds  Protonix VT daily   HEMATOLOGIC A: Leukocytosis -  resolved; in the setting of pneumonia   Anemia - stable hgb. S/p 1 unit pRBC 10/16. hgb slightly decreased from yesterday No signs of active bleeding  Coagulopathy - resolved; on coumadin at home  P:   Trending cell counts daily w/ CBC Transfuse for Hgb <7.0 Heparin Drip   ENDOCRINE A: H/O DM - A1c 10.6 in June 2017. A1c 7.0 this admission. CBGs in 200s in the setting of tube feeds   P:   SSI resistant Scheduled  Novolog 3 units q 4 hrs (only with tube feeds) > increase to 5 units q 4 hours with tube feeds Lantus 20 U  Accu-Checks q4hr  NEUROLOGIC A: Acute Encephalopathy - Multifactorial from hypoxia and probable toxic metabolic. Sedation on Ventilator  P:   RASS goal: 0 to -1 Precedex gtt to off as able Versed IV prn sedation  Klonipin 0.5 BID  INTEGUMENT:  A: Sacral Pressure Injury  P: - wound care: santyl with saline moistened gauze daily covered with dry gauze   If no diureses by AM then will need to speak with family regarding trach/peg.  The patient is critically ill with multiple organ systems failure and requires high complexity decision making for assessment and support, frequent evaluation and titration of therapies, application of advanced monitoring technologies and extensive interpretation of multiple databases.   Critical Care Time devoted to patient care services described in this note is  35  Minutes. This time reflects time of care of this signee Dr Jennet Maduro. This critical care time does not reflect procedure time, or teaching time or supervisory time of PA/NP/Med student/Med Resident etc but could involve care discussion time.  Rush Farmer, M.D. [facility] Pulmonary/Critical Care Medicine. Pager: [phone]. After hours pager: [phone].

## 2016-05-03 NOTE — Progress Notes (Signed)
Interim Progress Note:   Nursing reported patient's HR in 120s-130s despite one dose of IV metop, and PRN pain meds. He has history of aflutter/afib and is on Cardizem at home. This medication was held due to holding parameters over the past day or so and was discontinued. Patient's HR had been overall stable at least over the past day or so while Cardizem is held. EKG reads Afib with HR 112, but it seems like there are P waves. This is possibly aflutter. BP is stable.  Will reorder Cardizem per tube and monitor.   Palma HolterKanishka G Nayzeth Altman, MD PGY 2 Family Medicine

## 2016-05-04 ENCOUNTER — Inpatient Hospital Stay (HOSPITAL_COMMUNITY): Payer: Medicare HMO

## 2016-05-04 LAB — GLUCOSE, CAPILLARY
GLUCOSE-CAPILLARY: 311 mg/dL — AB (ref 65–99)
GLUCOSE-CAPILLARY: 206 mg/dL — AB (ref 65–99)
GLUCOSE-CAPILLARY: 182 mg/dL — AB (ref 65–99)
Glucose-Capillary: 115 mg/dL — ABNORMAL HIGH (ref 65–99)
Glucose-Capillary: 129 mg/dL — ABNORMAL HIGH (ref 65–99)
Glucose-Capillary: 228 mg/dL — ABNORMAL HIGH (ref 65–99)
Glucose-Capillary: 274 mg/dL — ABNORMAL HIGH (ref 65–99)

## 2016-05-04 LAB — BLOOD GAS, ARTERIAL
ACID-BASE EXCESS: 2 mmol/L (ref 0.0–2.0)
Bicarbonate: 24.9 mmol/L (ref 20.0–28.0)
Drawn by: 365271
FIO2: 40
O2 SAT: 99.5 %
PEEP/CPAP: 5 cmH2O
PH ART: 7.5 — AB (ref 7.350–7.450)
Patient temperature: 100.4
RATE: 22 resp/min
VT: 580 mL
pCO2 arterial: 32.5 mmHg (ref 32.0–48.0)
pO2, Arterial: 179 mmHg — ABNORMAL HIGH (ref 83.0–108.0)

## 2016-05-04 LAB — CULTURE, BLOOD (ROUTINE X 2)

## 2016-05-04 LAB — CBC
HEMATOCRIT: 30.2 % — AB (ref 39.0–52.0)
HEMOGLOBIN: 8.6 g/dL — AB (ref 13.0–17.0)
MCH: 26.9 pg (ref 26.0–34.0)
MCHC: 28.5 g/dL — ABNORMAL LOW (ref 30.0–36.0)
MCV: 94.4 fL (ref 78.0–100.0)
Platelets: 267 10*3/uL (ref 150–400)
RBC: 3.2 MIL/uL — ABNORMAL LOW (ref 4.22–5.81)
RDW: 18.8 % — ABNORMAL HIGH (ref 11.5–15.5)
WBC: 12.1 10*3/uL — AB (ref 4.0–10.5)

## 2016-05-04 LAB — BASIC METABOLIC PANEL
ANION GAP: 12 (ref 5–15)
BUN: 68 mg/dL — ABNORMAL HIGH (ref 6–20)
CO2: 27 mmol/L (ref 22–32)
Calcium: 8.6 mg/dL — ABNORMAL LOW (ref 8.9–10.3)
Chloride: 113 mmol/L — ABNORMAL HIGH (ref 101–111)
Creatinine, Ser: 1.39 mg/dL — ABNORMAL HIGH (ref 0.61–1.24)
GFR calc Af Amer: >60 mL/min (ref >60)
GFR, EST NON AFRICAN AMERICAN: 53 mL/min — AB (ref >60)
Glucose, Bld: 314 mg/dL — ABNORMAL HIGH (ref 65–99)
POTASSIUM: 3.9 mmol/L (ref 3.5–5.1)
SODIUM: 152 mmol/L — AB (ref 135–145)

## 2016-05-04 LAB — VANCOMYCIN, TROUGH: Vancomycin Tr: 18 ug/mL (ref 15–20)

## 2016-05-04 LAB — MAGNESIUM
MAGNESIUM: 1.9 mg/dL (ref 1.7–2.4)
MAGNESIUM: 2 mg/dL (ref 1.7–2.4)

## 2016-05-04 LAB — PHOSPHORUS: PHOSPHORUS: 3.7 mg/dL (ref 2.5–4.6)

## 2016-05-04 LAB — HEPATITIS B SURFACE ANTIGEN: HEP B S AG: NEGATIVE

## 2016-05-04 LAB — HEPARIN LEVEL (UNFRACTIONATED): Heparin Unfractionated: 0.63 IU/mL (ref 0.30–0.70)

## 2016-05-04 MED ORDER — FENTANYL CITRATE (PF) 100 MCG/2ML IJ SOLN
100.0000 ug | INTRAMUSCULAR | Status: DC | PRN
  Administered 2016-05-04: 100 ug via INTRAVENOUS
  Filled 2016-05-04: qty 2

## 2016-05-04 MED ORDER — IOPAMIDOL (ISOVUE-300) INJECTION 61%
15.0000 mL | INTRAVENOUS | Status: AC
  Administered 2016-05-04 (×2): 15 mL via ORAL

## 2016-05-04 MED ORDER — POTASSIUM CHLORIDE 10 MEQ/50ML IV SOLN: 10.0000 meq | INTRAVENOUS | Status: DC

## 2016-05-04 MED ORDER — INSULIN GLARGINE 100 UNIT/ML ~~LOC~~ SOLN
10.0000 [IU] | Freq: Once | SUBCUTANEOUS | Status: AC
  Administered 2016-05-04: 10 [IU] via SUBCUTANEOUS
  Filled 2016-05-04: qty 0.1

## 2016-05-04 MED ORDER — MAGNESIUM SULFATE IN D5W 1-5 GM/100ML-% IV SOLN: 1.0000 g | Freq: Once | INTRAVENOUS | Status: DC

## 2016-05-04 MED ORDER — INSULIN GLARGINE 100 UNIT/ML ~~LOC~~ SOLN
40.0000 [IU] | SUBCUTANEOUS | Status: DC
  Administered 2016-05-05 – 2016-05-07 (×3): 40 [IU] via SUBCUTANEOUS
  Filled 2016-05-04 (×3): qty 0.4

## 2016-05-04 MED ORDER — CLONAZEPAM 0.5 MG PO TABS
1.0000 mg | ORAL_TABLET | Freq: Two times a day (BID) | ORAL | Status: DC
  Administered 2016-05-04 – 2016-05-07 (×6): 1 mg
  Filled 2016-05-04 (×6): qty 2

## 2016-05-04 MED ORDER — CLONAZEPAM 0.5 MG PO TABS
0.5000 mg | ORAL_TABLET | ORAL | Status: AC
  Administered 2016-05-04: 0.5 mg via ORAL
  Filled 2016-05-04: qty 1

## 2016-05-04 MED ORDER — FREE WATER
300.0000 mL | Status: DC
  Administered 2016-05-04 – 2016-05-06 (×12): 300 mL

## 2016-05-04 MED ORDER — FENTANYL CITRATE (PF) 100 MCG/2ML IJ SOLN
INTRAMUSCULAR | Status: AC
  Administered 2016-05-04: 100 ug
  Filled 2016-05-04: qty 2

## 2016-05-04 NOTE — Progress Notes (Signed)
PULMONARY / CRITICAL CARE MEDICINE   Name: Clayton [name] MRN: [id number] DOB: 05-Jan-1956    ADMISSION DATE:  05/04/2016  REFERRING MD:  Alvino Chapel (EDP)   CHIEF COMPLAINT:  Sepsis, respiratory failure   HISTORY OF PRESENT ILLNESS:   60yo male SNF resident with hx CHF, CAD s/p remote stent, DM, HTN, Aflutter s/p ablation on coumadin, severe PVD s/p R BKA, being treated for unknown infection at SNF with rocephin.  Presented 10/12 from SNF after being found unresponsive with agonal breaths.  Bagged by EMS en route and intubated on arrival to ER.  In ER was febrile to 104, hypotensive with SBP 70's.  Labs notable for AKI with K 7.1, glucose 500, WBC 36, lactate 3.6.  PCCM consulted for admit.   SUBJECTIVE:  Patient noted to have HR in 120s-130s despite one dose of IV metop, and PRN pain meds. EKG obtained seemed consistent with Aflutter. He was started on amiodarone drip (with x1 bolus). His electrolytes were normal. HR has been stable since then. His blood pressures started to be in low normal range since he had elevated heart rate. They continue to be soft even after HR is better controlled. However MAPs have mainly been 70s to 80 with two intermittent MAP in 64 and 62. He continues to spike fevers throughout the day with max up to 101.F. He is back to NSR this AM.   REVIEW OF SYSTEMS:  Answers questions with nodding/shanking of head. No pain.   VITAL SIGNS: BP (!) 80/53   Pulse (!) 49   Temp (!) 101.1 F (38.4 C)   Resp (!) 22   Ht '5\' 10"'$  (1.778 m)   Wt 93.9 kg (207 lb 0.2 oz)   SpO2 100%   BMI 29.70 kg/m   HEMODYNAMICS:    VENTILATOR SETTINGS: Vent Mode: PRVC FiO2 (%):  [0.9 %-40 %] 40 % Set Rate:  [22 bmp] 22 bmp Vt Set:  [580 mL] 580 mL PEEP:  [5 cmH20] 5 cmH20 Pressure Support:  [10 cmH20] 10 cmH20 Plateau Pressure:  [19 cmH20-23 cmH20] 23 cmH20  INTAKE / OUTPUT: I/O last 3 completed shifts: In: 4778.1 [I.V.:1708.1; Other:520; NG/GT:2250; IV Piggyback:300] Out: 5150  [Urine:5150]  PHYSICAL EXAMINATION:  General:  NAD  Neuro:  Answers questions with nodding or shaking of head  HEENT:  ETT in place. No scleral icterus or injection. Cardiovascular: regular rhythm. No appreciable JVD. Lungs: coarse breath sounds. Symmetric chest rise on ventilator.  Abdomen:  Normal bowel bowel sounds. PEG tube in place without surrounding erythema, Nontender, + BS Musculoskeletal:  Warm and dry. 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 Right foot is wrapped in soft support.   LABS:  BMET  Recent Labs Lab 05/03/16 0420 05/03/16 2155 05/04/16 0430  NA 151* 153* 152*  K 3.5 3.6 3.9  CL 116* 116* 113*  CO2 '26 27 27  '$ BUN 52* 64* 68*  CREATININE 1.21 1.39* 1.39*  GLUCOSE 227* 254* 314*    Electrolytes  Recent Labs Lab 05/02/16 0415  05/03/16 0420 05/03/16 2155 05/04/16 0020 05/04/16 0430  CALCIUM 8.3*  < > 8.1* 8.6*  --  8.6*  MG 2.4  --  2.3  --  1.9 2.0  PHOS 3.4  --  3.0  --   --  3.7  < > = values in this interval not displayed.  CBC  Recent Labs Lab 05/02/16 0415 05/03/16 0420 05/04/16 0430  WBC 10.8* 9.4 12.1*  HGB 8.4* 7.8* 8.6*  HCT 28.8* 26.9* 30.2*  PLT 273 235 267    Coag's No results for input(s): APTT, INR in the last 168 hours.  Sepsis Markers  Recent Labs Lab 04/29/16 1000 04/29/16 1120 05/01/16 0321 05/03/16 0420  LATICACIDVEN 1.8  --   --   --   PROCALCITON  --  0.71 0.34 0.24    ABG  Recent Labs Lab 05/03/16 0402 05/04/16 0341  PHART 7.551* 7.500*  PCO2ART 29.9* 32.5  PO2ART 164* 179*    Liver Enzymes  Recent Labs Lab 04/27/16 0842  04/29/16 1120  05/01/16 0321 05/02/16 0415 05/03/16 0420  AST 80*  --  52*  --   --   --   --   ALT 58  --  54  --   --   --   --   ALKPHOS 170*  --  142*  --   --   --   --   BILITOT 1.1  --  0.9  --   --   --   --   ALBUMIN 1.7*  < > 1.8*  < > 2.1* 2.3* 2.2*  < > = values in this interval not displayed.  Cardiac Enzymes No results for input(s):  TROPONINI, PROBNP in the last 168 hours.  Glucose  Recent Labs Lab 05/03/16 0740 05/03/16 1125 05/03/16 1529 05/03/16 2045 05/03/16 2347 05/04/16 0344  GLUCAP 200* 219* 198* 233* 228* 274*    Imaging No results found.   STUDIES:  Renal U/S 10/12:  No abnormality or hydronephrosis.  TTE 10/13:  Difficult study. Mild LVH w/ EF 55%. Possible akinesis apical myocardium without obvious thrombus.Moderate aortic stenosis w/ mild mitral stenosis. RV poorly visualized. CT abdomen 10/15: no evidence for retroperitoneal hemorrhage. RLL central airspace disease compatible with aspiration and/or PNA CXR: 10/17: unchanged from 10/16. No consolidation. Final read pending.  RUQ [place] 10/18: Cholelithiasis without sonographic evidence acute cholecystitis. A hepatobiliary scintigraphy may provide better evaluation of the gallbladder if an acute cholecystitis is clinically suspected. Fatty Liver.  CXR 10/19: No pulmonary edema. Stable endotracheal tube position. Hazy right infrahilar and right base atelectasis or infiltrate CXR 10/22: no acute abnormality  X-ray R Foot 10/21: no osteomyelitis   HIV 10/22: non reactive  Hep C 10/22: negative Hep B 10/22: pending   MICROBIOLOGY: MRSA PCR 10/12:  Negative Blood Ctx x2 10/12 >> 1/2 Coag Neg Staph; 1/2 NG x 5 days  Urine Ctx 10/12:  Negative  Tracheal Asp Ctx 10/13 >> MRSA Respiratory Viral Panel PCR 10/13:  Negative  Blood Cx 10/21: PCR enterococcus, coag neg staph MR. Cx- coag neg staph in one botle; 2nd bottle No growth x 3 days Tracheal Aspirate cx 10/21: MRSA Urine Culture 10/21: No growth final   ANTIBIOTICS: Zosyn 10/12 x1 dose; 10/16 >>10/17 Vancomycin 10/12 >>10/18 Cefepime 10/12 >> 10/16 Vancomycin 10/22 >> Ampicillin 10/22 >>10/24  SIGNIFICANT EVENTS: 10/12 - Admit & intubation in ED 10/14 - switched from Levo to Neo due to intermittent tachycardia  10/15 - off pressor 10/17 - Failed wean 10/18 - failed wean started lasix;  Completed Vanc for PNA  10/19 - Failed wean  10/20 - continues to have fevers despite completing abx therapy for PNA. ID consulted.  ESR and CRP elevated.  10/22 - possible bacteremia with Enterococcus/MRSA on PCA with pending cultures. Started on Vanc and Ampicillin; Lasix switched from TID to BID.  10/24 - discontinued Ampicillin per ID. Had Aflutter started on amiodarone drip.    LINES/TUBES: OETT 7.5 10/12 >>  RUE PICC (SNF)  10/11 >>   Foley 10/12 >> PEG >> PIV x1  ASSESSMENT / PLAN:  Mr. Bushart is a  60 y.o. male with PMH CHF, CAD s/p remote stent, DM, HTN, Aflutter s/p ablation on anticoagulation, severe PVD s/p R BKA. Presenting with septic shock likely from MRSA PNA. Is on Heparin for history of a flutter.  Attempted SBT but did not pass; Possibly fluid overloaded therefore diuresing wtih Lasix. Continues to have fevers despite completing antibiotic therapy for MRSA PNA. He was found to have possible bacteremia with Enterococcus/coagulase neg MR staph on PCR in 1/2 bottles with pending cultures from 10/22 and started on Vancomycin and Ampicillin. However this grew coag negative staph in one bottle which is likely a contaminant and therefore Ampicillin was discontinued 10/24, and Vancomycin was continued for treatment of MRSA PNA. Despite being on antibiotics he still continues to spike fevers. He also developed atrial flutter overnight but HR now controlled with amiodarone. He had soft blood pressures possibly due to overdiuresis.   INFECTIOUS A: Severe Sepsis, resolved RLL MRSA PNA: tracheal aspirate culture with MRSA which was treated but repeat cx grew MRSA. Continued fevers: unlikely bacteremia thus far as 1 bottle with coag negative staph.   P:   S/p Vancomycin for 7 days  S/p Cefepime and Zosyn complete Restarted Vanc 10/22 > Ampicillin 10/22 >>>10/24 ID Consulted: Would treat for total of 14 days of Vancomycin for MRSA PNA Hepatitis Panel HCV negative HBV S ag negative   Consider CT to evaluate empyema due to high fevers?  PULMONARY A: Acute Hypoxic Respiratory Failure - likely due to PNA Respiratory Alkalosis - improving  P:   Full Vent Support  Daily PS Wean & SBT Intermittent CXR & ABG Albuterol nebs prn  CARDIOVASCULAR A: Hypotension: Maps mainly in the 70s. Likely due to diuresis  Shock - Likely septic. Resolved and off presors.  Elevated Troponin I - Likely demand ischemia. Echo was difficult to interpret. Questionable apical akinesis. Aortic Stenosis - Seen on poor quality TTE. H/O CAD w/ Stent H/O CHF - Dilated LV w/ decreased function November 2016. A Flutter - S/P ablation, had episode overnight 10/24 but NSR this AM after starting amiodarone, QTC 463 this AM (from 453)  P:  Amiodarone drip > consider PO  Heparin drip  Continuous telemetry monitoring Vitals per unit protocol ASA '81mg'$  VT daily  RENAL A: Acute Renal Failure - Creatinine slightly increased with diuresis Had renal US earlier in hospital stay which was unremarkable. Hypernatremia - improving  Hypophosphatemia - Resolved Hypokalemia - resolved  Hypomagnesemia - resolved Lactic Acidosis - Resolved. AGMA - Resolved.   P:   Monitoring UOP with Foley Trending Electrolytes and Renal function Free water 250cc q 4 hrs   GASTROINTESTINAL A: Loose stools: c diff negative  RUQ tenderness : RUQ [place] with cholelithiasis without sonographic evidence of cholecystitis. Tenderness resolved.  RLQ Tenderness: Resolved; initially reported by patient  P:   NPO  Tube feeds  Protonix VT daily   HEMATOLOGIC A: Leukocytosis -  resolved; in the setting of pneumonia   Anemia - stable hgb. S/p 1 unit pRBC 10/16. No signs of active bleeding  Coagulopathy - resolved; on coumadin at home  P:  Trending cell counts daily w/ CBC Transfuse for Hgb <7.0 Heparin Drip   ENDOCRINE A: H/O DM - A1c 10.6 in June 2017. A1c 7.0 this admission. CBGs in 200s in the setting of tube feeds    P:   SSI resistant Scheduled Novolog 5  units q 4 hrs (only with tube feeds)  Lantus 30 U > 40 Units  Accu-Checks q4hr  NEUROLOGIC A: Acute Encephalopathy - Multifactorial from hypoxia and probable toxic metabolic. Sedation on Ventilator  P:   RASS goal: 0 to -1 Precedex gtt to off as able Versed IV prn sedation  Klonipin 0.5 BID  INTEGUMENT:  A: Sacral Pressure Injury  P: - wound care: santyl with saline moistened gauze daily covered with dry gauze   Smiley Houseman, MD PGY 2 Family Medicine   Rush Farmer, M.D. [facility] Pulmonary/Critical Care Medicine. Pager: [phone]. After hours pager: [phone].

## 2016-05-04 NOTE — Progress Notes (Deleted)
[facility] FOR AM LAB REPLACEMENT ONLY  The patient does apply for the [facility] Adult ICU Electrolyte Replacment Protocol based on the criteria listed below:   1. Is GFR >/= 40 ml/min? Yes.    Patient's GFR today is >60 2. Is urine output >/= 0.5 ml/kg/hr for the last 6 hours? Yes.   Patient's UOP is 1.15 ml/kg/hr 3. Is BUN < 60 mg/dL? Yes.    Patient's BUN today is 43 4. Abnormal electrolyte  K 3.1 5. Ordered repletion with: per protocol 6. If a panic level lab has been reported, has the CCM MD in charge been notified? Yes.  .   Physician:  Tonny BranchSommer  Danner Paulding, Lang Snowlizabeth McEachran 05/04/2016 3:38 AM

## 2016-05-04 NOTE — Procedures (Signed)
Pt transported to CT and back to 2M04 on vent, without complication.

## 2016-05-04 NOTE — Progress Notes (Signed)
ANTICOAGULATION CONSULT NOTE  Pharmacy Consult for Heparin Indication: atrial fibrillation  Allergies  Allergen Reactions  . Niaspan [Niacin Er] Hives and Other (See Comments)    Reaction:  Facial redness/hotness     Patient Measurements: Height: 5\' 10"  (177.8 cm) Weight: 207 lb 0.2 oz (93.9 kg) IBW/kg (Calculated) : 73  Heparin Wt: 97 kg  Vital Signs: Temp: 101.3 F (38.5 C) (10/25 0600) Temp Source: Core (Comment) (10/25 0400) BP: 91/57 (10/25 0600) Pulse Rate: 91 (10/25 0600)  Labs:  Recent Labs  05/02/16 0415 05/02/16 1030  05/03/16 0420 05/03/16 2155 05/04/16 0430  HGB 8.4*  --   --  7.8*  --  8.6*  HCT 28.8*  --   --  26.9*  --  30.2*  PLT 273  --   --  235  --  267  HEPARINUNFRC 0.50 0.55  --  0.64  --  0.63  CREATININE 1.14  --   < > 1.21 1.39* 1.39*  < > = values in this interval not displayed.  Estimated Creatinine Clearance: 63.4 mL/min (by C-G formula based on SCr of 1.39 mg/dL (H)).  Assessment: [age] yoM with atrial flutter on warfarin PTA now on heparin per pharmacy dosing. Abdominal US negative for a bleed. Heparin level continues to be therapeutic on 2000 units/hr at 0.63 this morning. No issues with bleeding noted, Hgb back up to 8.6, platelets remain stable.   Goal of Therapy: Heparin level 0.3 - 0.7 units/mL Monitor platelets by anticoagulation protocol: Yes  Plan:  - Continue heparin 2000 units/hr - Monitor daily heparin level, CBC, S/Sx bleeding  Fredonia HighlandMichael Dim Meisinger, PharmD PGY-1 Pharmacy Resident Pager: [phone] 05/04/2016

## 2016-05-04 NOTE — Progress Notes (Signed)
Pharmacy Antibiotic Note - Vancomycin  Clayton [name] is a 60 y.o. male on vancomycin per pharmacy dosing. Blood culture showed 1/2 GPC in clusters, BCID showed enterococcus and MR staph species, and repeat trach aspirate showed continued MRSA pneumonia. Resumed vancomycin and started ampicillin for suspected Enterococcal bacteremia. ID consulted and suspected enterococcus to be contaminant and ampicillin discontinued 10/24. ID recommends continuing vancomycin for 14 days for MRSA pneumonia. Vancomycin trough this morning was at goal at 18 on 500 mg q12h. He continues to spike fevers (Tmax 101.3), WBC increased 12.1, PCT trending down, SCr has increased 1.21 >> 1.39.   Plan: - Continue vancomycin 500 mg IV q12h (goal trough 15-20) - Monitor clinical status, renal function, and cultures   Height: 5\' 10"  (177.8 cm) Weight: 207 lb 0.2 oz (93.9 kg) IBW/kg (Calculated) : 73  Temp (24hrs), Avg:101.1 F (38.4 C), Min:100.4 F (38 C), Max:101.5 F (38.6 C)   Recent Labs Lab 04/29/16 1000  04/30/16 0443  05/01/16 0321 05/02/16 0415 05/02/16 1455 05/03/16 0420 05/03/16 2155 05/04/16 0430 05/04/16 0800  WBC  --   --  10.7*  --  11.3* 10.8*  --  9.4  --  12.1*  --   CREATININE  --   < > 1.04  < > 1.03 1.14 1.21 1.21 1.39* 1.39*  --   LATICACIDVEN 1.8  --   --   --   --   --   --   --   --   --   --   VANCOTROUGH  --   --   --   --   --   --   --   --   --   --  18  < > = values in this interval not displayed.  Estimated Creatinine Clearance: 63.4 mL/min (by C-G formula based on SCr of 1.39 mg/dL (H)).    Allergies  Allergen Reactions  . Niaspan [Niacin Er] Hives and Other (See Comments)    Reaction:  Facial redness/hotness     Antimicrobials this admission: Zosyn 10/12 x1, 1016 >>10/17 Vanc 10/12>>10/18>> resume 10/22 >> Cefepime 10/12>>10/15 Ampicillin 10/22 >> 10/25  Dose adjustments this admission: VT 10/16 = 23 on 750 mg IV every 12h >>changed to 500 mg q12h VT 10/25 = 18 on  500 mg q12h  Microbiology results: 10/13 BCID: +staph spp, methicillin-resistance  10/12 BCx: 1/2 CNS 10/12 UCx: ngtd 10/12 MRSA PCR: neg 10/13 Resp panel: neg 10/13 Sputum: MRSA 10/16: C. Diff negative 10/21 BCx2: 1/2 GPC in clusters  10/22 BCID: enterococcus and MR staph species    Thank you for allowing pharmacy to be a part of this patient's care.  Clayton [name], PharmD Candidate 05/04/2016 10:37 AM

## 2016-05-04 NOTE — Progress Notes (Signed)
PULMONARY / CRITICAL CARE MEDICINE   Name: Clayton [name] MRN: [id number] DOB: 1955/12/24    ADMISSION DATE:  05/09/2016  REFERRING MD:  Alvino Chapel (EDP)   CHIEF COMPLAINT:  Sepsis, respiratory failure   HISTORY OF PRESENT ILLNESS:   60yo male SNF resident with hx CHF, CAD s/p remote stent, DM, HTN, Aflutter s/p ablation on coumadin, severe PVD s/p R BKA, being treated for unknown infection at SNF with rocephin.  Presented 10/12 from SNF after being found unresponsive with agonal breaths.  Bagged by EMS en route and intubated on arrival to ER.  In ER was febrile to 104, hypotensive with SBP 70's.  Labs notable for AKI with K 7.1, glucose 500, WBC 36, lactate 3.6.  PCCM consulted for admit.   SUBJECTIVE:  Patient noted to have HR in 120s-130s despite one dose of IV metop, and PRN pain meds. EKG obtained seemed consistent with Aflutter. He was started on amiodarone drip (with x1 bolus). His electrolytes were normal. HR has been stable since then. His blood pressures started to be in low normal range since he had elevated heart rate. They continue to be soft even after HR is better controlled. However MAPs have mainly been 70s to 80 with two intermittent MAP in 64 and 62. He continues to spike fevers throughout the day with max up to 101.F. He is back to NSR this AM.   REVIEW OF SYSTEMS:  Answers questions with nodding/shanking of head. No pain.   VITAL SIGNS: BP (!) 143/72   Pulse 97   Temp (!) 100.8 F (38.2 C)   Resp (!) 22   Ht _0  (1.778 m)   Wt 93.9 kg (207 lb 0.2 oz)   SpO2 100%   BMI 29.70 kg/m   HEMODYNAMICS:    VENTILATOR SETTINGS: Vent Mode: PRVC FiO2 (%):  [0.9 %-40 %] 40 % Set Rate:  [22 bmp] 22 bmp Vt Set:  [580 mL] 580 mL PEEP:  [5 cmH20] 5 cmH20 Plateau Pressure:  [19 cmH20-23 cmH20] 20 cmH20  INTAKE / OUTPUT: I/O last 3 completed shifts: In: 4880.2 [I.V.:1760.2; Other:520; NG/GT:2300; IV Piggyback:300] Out: 5150 [Urine:5150]  PHYSICAL EXAMINATION:   General:  NAD  Neuro:  Answers questions with nodding or shaking of head  HEENT:  ETT in place. No scleral icterus or injection. Cardiovascular: regular rhythm. No appreciable JVD. Lungs: coarse breath sounds. Symmetric chest rise on ventilator.  Abdomen:  Normal bowel bowel sounds. PEG tube in place without surrounding erythema, Nontender, + BS Musculoskeletal:  Warm and dry. 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 Right foot is wrapped in soft support.   LABS:  BMET  Recent Labs Lab 05/03/16 0420 05/03/16 2155 05/04/16 0430  NA 151* 153* 152*  K 3.5 3.6 3.9  CL 116* 116* 113*  CO2 _1 BUN 52* 64* 68*  CREATININE 1.21 1.39* 1.39*  GLUCOSE 227* 254* 314*    Electrolytes  Recent Labs Lab 05/02/16 0415  05/03/16 0420 05/03/16 2155 05/04/16 0020 05/04/16 0430  CALCIUM 8.3*  < > 8.1* 8.6*  --  8.6*  MG 2.4  --  2.3  --  1.9 2.0  PHOS 3.4  --  3.0  --   --  3.7  < > = values in this interval not displayed.  CBC  Recent Labs Lab 05/02/16 0415 05/03/16 0420 05/04/16 0430  WBC 10.8* 9.4 12.1*  HGB 8.4* 7.8* 8.6*  HCT 28.8* 26.9* 30.2*  PLT 273 235 267  Coag's No results for input(s): APTT, INR in the last 168 hours.  Sepsis Markers  Recent Labs Lab 04/29/16 1000 04/29/16 1120 05/01/16 0321 05/03/16 0420  LATICACIDVEN 1.8  --   --   --   PROCALCITON  --  0.71 0.34 0.24    ABG  Recent Labs Lab 05/03/16 0402 05/04/16 0341  PHART 7.551* 7.500*  PCO2ART 29.9* 32.5  PO2ART 164* 179*    Liver Enzymes  Recent Labs Lab 04/29/16 1120  05/01/16 0321 05/02/16 0415 05/03/16 0420  AST 52*  --   --   --   --   ALT 54  --   --   --   --   ALKPHOS 142*  --   --   --   --   BILITOT 0.9  --   --   --   --   ALBUMIN 1.8*  < > 2.1* 2.3* 2.2*  < > = values in this interval not displayed.  Cardiac Enzymes No results for input(s): TROPONINI, PROBNP in the last 168 hours.  Glucose  Recent Labs Lab 05/03/16 1125 05/03/16 1529  05/03/16 2045 05/03/16 2347 05/04/16 0344 05/04/16 0727  GLUCAP 219* 198* 233* 228* 274* 311*    Imaging Dg Chest Port 1 View  Result Date: 05/04/2016 CLINICAL DATA:  Intubation. EXAM: PORTABLE CHEST 1 VIEW COMPARISON:  05/03/2016. FINDINGS: Endotracheal tube and right PICC line in stable position. Prior CABG. Heart size stable. No focal infiltrate. No pleural effusion or pneumothorax . IMPRESSION: 1. Lines and tubes stable position. 2. Prior CABG. Heart size stable. No acute pulmonary disease. Chest is stable from prior exam. Electronically Signed   By: Marcello Moores  Register   On: 05/04/2016 07:28     STUDIES:  Renal U/S 10/12:  No abnormality or hydronephrosis.  TTE 10/13:  Difficult study. Mild LVH w/ EF 55%. Possible akinesis apical myocardium without obvious thrombus.Moderate aortic stenosis w/ mild mitral stenosis. RV poorly visualized. CT abdomen 10/15: no evidence for retroperitoneal hemorrhage. RLL central airspace disease compatible with aspiration and/or PNA CXR: 10/17: unchanged from 10/16. No consolidation. Final read pending.  RUQ [place] 10/18: Cholelithiasis without sonographic evidence acute cholecystitis. A hepatobiliary scintigraphy may provide better evaluation of the gallbladder if an acute cholecystitis is clinically suspected. Fatty Liver.  CXR 10/19: No pulmonary edema. Stable endotracheal tube position. Hazy right infrahilar and right base atelectasis or infiltrate CXR 10/22: no acute abnormality  X-ray R Foot 10/21: no osteomyelitis   HIV 10/22: non reactive  Hep C 10/22: negative Hep B 10/22: pending   MICROBIOLOGY: MRSA PCR 10/12:  Negative Blood Ctx x2 10/12 >> 1/2 Coag Neg Staph; 1/2 NG x 5 days  Urine Ctx 10/12:  Negative  Tracheal Asp Ctx 10/13 >> MRSA Respiratory Viral Panel PCR 10/13:  Negative  Blood Cx 10/21: PCR enterococcus, coag neg staph MR. Cx- coag neg staph in one botle; 2nd bottle No growth x 3 days Tracheal Aspirate cx 10/21: MRSA Urine Culture  10/21: No growth final   ANTIBIOTICS: Zosyn 10/12 x1 dose; 10/16 >>10/17 Vancomycin 10/12 >>10/18 Cefepime 10/12 >> 10/16 Vancomycin 10/22 >> Ampicillin 10/22 >>10/24  SIGNIFICANT EVENTS: 10/12 - Admit & intubation in ED 10/14 - switched from Levo to Neo due to intermittent tachycardia  10/15 - off pressor 10/17 - Failed wean 10/18 - failed wean started lasix; Completed Vanc for PNA  10/19 - Failed wean  10/20 - continues to have fevers despite completing abx therapy for PNA. ID consulted.  ESR and  CRP elevated.  10/22 - possible bacteremia with Enterococcus/MRSA on PCA with pending cultures. Started on Vanc and Ampicillin; Lasix switched from TID to BID.  10/24 - discontinued Ampicillin per ID. Had Aflutter started on amiodarone drip.    LINES/TUBES: OETT 7.5 10/12 >>  RUE PICC (SNF) 10/11 >>   Foley 10/12 >> PEG >> PIV x1  ASSESSMENT / PLAN:  Mr. Lawhorn is a  60 y.o. male with PMH CHF, CAD s/p remote stent, DM, HTN, Aflutter s/p ablation on anticoagulation, severe PVD s/p R BKA. Presenting with septic shock likely from MRSA PNA. Is on Heparin for history of a flutter.  Attempted SBT but did not pass; Possibly fluid overloaded therefore diuresing wtih Lasix. Continues to have fevers despite completing antibiotic therapy for MRSA PNA. He was found to have possible bacteremia with Enterococcus/coagulase neg MR staph on PCR in 1/2 bottles with pending cultures from 10/22 and started on Vancomycin and Ampicillin. However this grew coag negative staph in one bottle which is likely a contaminant and therefore Ampicillin was discontinued 10/24, and Vancomycin was continued for treatment of MRSA PNA. Despite being on antibiotics he still continues to spike fevers. He also developed atrial flutter overnight but HR now controlled with amiodarone. He had soft blood pressures possibly due to overdiuresis.   INFECTIOUS A: Severe Sepsis, resolved RLL MRSA PNA: tracheal aspirate culture with MRSA  which was treated but repeat cx grew MRSA. Continued fevers: unlikely bacteremia thus far as 1 bottle with coag negative staph.   P:   S/p Vancomycin for 7 days  S/p Cefepime and Zosyn complete Restarted Vanc 10/22 > Ampicillin 10/22 >>>10/24 ID Consulted: Would treat for total of 14 days of Vancomycin for MRSA PNA Hepatitis Panel HCV negative HBV S ag negative  CT of chest, abdomen and plevis  PULMONARY A: Acute Hypoxic Respiratory Failure - likely due to PNA Respiratory Alkalosis - improving  P:   Full Vent Support  Daily PS Wean & SBT Intermittent CXR & ABG Albuterol nebs prn  CARDIOVASCULAR A: Hypotension: Maps mainly in the 70s. Likely due to diuresis  Shock - Likely septic. Resolved and off presors.  Elevated Troponin I - Likely demand ischemia. Echo was difficult to interpret. Questionable apical akinesis. Aortic Stenosis - Seen on poor quality TTE. H/O CAD w/ Stent H/O CHF - Dilated LV w/ decreased function November 2016. A Flutter - S/P ablation, had episode overnight 10/24 but NSR this AM after starting amiodarone, QTC 463 this AM (from 453)  P:  Amiodarone drip > d/c Heparin drip  Continuous telemetry monitoring Vitals per unit protocol ASA 30m VT daily  RENAL A: Acute Renal Failure - Creatinine slightly increased with diuresis Had renal [place] stay which was unremarkable. Hypernatremia - improving  Hypophosphatemia - Resolved Hypokalemia - resolved  Hypomagnesemia - resolved Lactic Acidosis - Resolved. AGMA - Resolved.   P:   Monitoring UOP with Foley Trending Electrolytes and Renal function Free water 250cc q 4 hrs   GASTROINTESTINAL A: Loose stools: c diff negative  RUQ tenderness : RUQ [place] chole[place] without sonographic evidence of cholecystitis. Tenderness resolved.  RLQ Tenderness: Resolved; initially reported by patient  P:   NPO  Tube feeds  Protonix VT daily   HEMATOLOGIC A: Leukocytosis -  resolved; in  the setting of pneumonia   Anemia - stable hgb. S/p 1 unit pRBC 10/16. No signs of active bleeding  Coagulopathy - resolved; on coumadin at home  P:  Trending cell  counts daily w/ CBC Transfuse for Hgb <7.0 Heparin Drip   ENDOCRINE A: H/O DM - A1c 10.6 in June 2017. A1c 7.0 this admission. CBGs in 200s in the setting of tube feeds   P:   SSI resistant Scheduled Novolog 5 units q 4 hrs (only with tube feeds)  Lantus 30 U > 40 Units  Accu-Checks q4hr  NEUROLOGIC A: Acute Encephalopathy - Multifactorial from hypoxia and probable toxic metabolic. Sedation on Ventilator  P:   RASS goal: 0 to -1. Precedex gtt d/c 10/25. Versed IV prn sedation. Klonipin 0.5 BID.  INTEGUMENT:  A: Sacral Pressure Injury  P: - Wound care: santyl with saline moistened gauze daily covered with dry gauze   Family to come back to [place] tomorrow with decision on trach/peg.  The patient is critically ill with multiple organ systems failure and requires high complexity decision making for assessment and support, frequent evaluation and titration of therapies, application of advanced monitoring technologies and extensive interpretation of multiple databases.   Critical Care Time devoted to patient care services described in this note is  35  Minutes. This time reflects time of care of this signee Dr Jennet Maduro. This critical care time does not reflect procedure time, or teaching time or supervisory time of PA/NP/Med student/Med Resident etc but could involve care discussion time.  Rush Farmer, M.D. [facility] Pulmonary/Critical Care Medicine. Pager: [phone]. After hours pager: [phone].

## 2016-05-04 NOTE — Progress Notes (Signed)
Subjective:  Had fevers up til last night, afebrile this morning. He had chest abd pelvis ct that showed some improvement in pulmonary process. He is still requiring FiO2 at 40%  Antibiotics:  Anti-infectives    Start     Dose/Rate Route Frequency Ordered Stop   05/01/16 1000  ampicillin (OMNIPEN) 2 g in sodium chloride 0.9 % 50 mL IVPB  Status:  Discontinued     2 g 150 mL/hr over 20 Minutes Intravenous Every 6 hours 05/01/16 0946 05/03/16 2250   05/01/16 0900  ceFAZolin (ANCEF) IVPB 2g/100 mL premix  Status:  Discontinued     2 g 200 mL/hr over 30 Minutes Intravenous Every 8 hours 05/01/16 0835 05/01/16 0844   05/01/16 0900  vancomycin (VANCOCIN) 500 mg in sodium chloride 0.9 % 100 mL IVPB     500 mg 100 mL/hr over 60 Minutes Intravenous Every 12 hours 05/01/16 0835     05/01/16 0900  ceFAZolin (ANCEF) IVPB 2g/100 mL premix  Status:  Discontinued     2 g 200 mL/hr over 30 Minutes Intravenous Every 8 hours 05/01/16 0844 05/01/16 0946   04/26/16 1600  vancomycin (VANCOCIN) 500 mg in sodium chloride 0.9 % 100 mL IVPB     500 mg 100 mL/hr over 60 Minutes Intravenous Every 12 hours 04/26/16 1112 04/27/16 1626   04/26/16 1100  doxycycline (VIBRAMYCIN) 100 mg in dextrose 5 % 250 mL IVPB  Status:  Discontinued     100 mg 125 mL/hr over 120 Minutes Intravenous Every 12 hours 04/26/16 1053 04/26/16 1100   04/25/16 1600  vancomycin (VANCOCIN) 500 mg in sodium chloride 0.9 % 100 mL IVPB  Status:  Discontinued     500 mg 100 mL/hr over 60 Minutes Intravenous Every 12 hours 04/25/16 1558 04/26/16 1053   04/25/16 1230  piperacillin-tazobactam (ZOSYN) IVPB 3.375 g  Status:  Discontinued     3.375 g 12.5 mL/hr over 240 Minutes Intravenous Every 8 hours 04/25/16 1222 04/26/16 1053   04/23/16 0000  ceFEPIme (MAXIPIME) 1 g in dextrose 5 % 50 mL IVPB  Status:  Discontinued     1 g 100 mL/hr over 30 Minutes Intravenous Every 12 hours 04/22/16 1439 04/25/16 1117   04/22/16 1500  vancomycin  (VANCOCIN) IVPB 750 mg/150 ml premix  Status:  Discontinued     750 mg 150 mL/hr over 60 Minutes Intravenous Every 12 hours 04/22/16 1439 04/25/16 1550   04/13/2016 1400  piperacillin-tazobactam (ZOSYN) IVPB 2.25 g  Status:  Discontinued     2.25 g 100 mL/hr over 30 Minutes Intravenous Every 6 hours 04/26/2016 0831 05/10/2016 0914   05/05/2016 1200  ceFEPIme (MAXIPIME) 1 g in dextrose 5 % 50 mL IVPB  Status:  Discontinued     1 g 100 mL/hr over 30 Minutes Intravenous Every 24 hours 05/02/2016 0914 04/22/16 1439   04/17/2016 0730  piperacillin-tazobactam (ZOSYN) IVPB 3.375 g     3.375 g 100 mL/hr over 30 Minutes Intravenous  Once 04/17/2016 0723 04/18/2016 0815   05/10/2016 0730  vancomycin (VANCOCIN) IVPB 1000 mg/200 mL premix  Status:  Discontinued     1,000 mg 200 mL/hr over 60 Minutes Intravenous  Once 05/03/2016 0723 05/04/2016 0726   04/19/2016 0730  vancomycin (VANCOCIN) 2,000 mg in sodium chloride 0.9 % 500 mL IVPB     2,000 mg 250 mL/hr over 120 Minutes Intravenous  Once 04/19/2016 0726 04/23/2016 1033      Medications: Scheduled Meds: . aspirin  81 mg Oral Daily  . chlorhexidine gluconate (MEDLINE KIT)  15 mL Mouth Rinse BID  . clonazePAM  0.5 mg Oral NOW  . clonazePAM  1 mg Per Tube BID  . collagenase   Topical Daily  . feeding supplement (PRO-STAT SUGAR FREE 64)  30 mL Per Tube TID  . free water  300 mL Per Tube Q4H  . insulin aspart  0-20 Units Subcutaneous Q4H  . insulin aspart  5 Units Subcutaneous Q4H  . [START ON 05/05/2016] insulin glargine  40 Units Subcutaneous Q24H  . mouth rinse  15 mL Mouth Rinse QID  . pantoprazole sodium  40 mg Per Tube Q1200  . vancomycin  500 mg Intravenous Q12H   Continuous Infusions: . sodium chloride 10 mL/hr at 05/04/16 0957  . dextrose    . feeding supplement (VITAL HIGH PROTEIN) 1,000 mL (05/04/16 1511)  . heparin 2,000 Units/hr (05/04/16 0444)    Objective: Weight change:   Intake/Output Summary (Last 24 hours) at 05/04/16 1619 Last data filed at  05/04/16 1300  Gross per 24 hour  Intake          3973.15 ml  Output             2555 ml  Net          1418.15 ml   Blood pressure 111/72, pulse 81, temperature 99.5 F (37.5 C), temperature source Oral, resp. rate 16, height 5\' 10"  (1.778 m), weight 207 lb 0.2 oz (93.9 kg), SpO2 100 %. 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 laterally Neuro: nonfocal, strength and sensation intact  CBC:  CBC Latest Ref Rng & Units 05/04/2016 05/03/2016 05/02/2016  WBC 4.0 - 10.5 K/uL 12.1(H) 9.4 10.8(H)  Hemoglobin 13.0 - 17.0 g/dL 05/04/2016) 7.8(L) 8.4(L)  Hematocrit 39.0 - 52.0 % 30.2(L) 26.9(L) 28.8(L)  Platelets 150 - 400 K/uL 267 235 273      BMET  Recent Labs  05/03/16 2155 05/04/16 0430  NA 153* 152*  K 3.6 3.9  CL 116* 113*  CO2 27 27  GLUCOSE 254* 314*  BUN 64* 68*  CREATININE 1.39* 1.39*  CALCIUM 8.6* 8.6*     Micro Results: Recent Results (from the past 720 hour(s))  Blood Culture (routine x 2)     Status: None   Collection Time: 04/16/2016  7:30 AM  Result Value Ref Range Status   Specimen Description BLOOD LEFT ANTECUBITAL  Final   Special Requests BOTTLES DRAWN AEROBIC AND ANAEROBIC 5CC  Final   Culture NO GROWTH 5 DAYS  Final   Report Status 04/26/2016 FINAL  Final  Blood Culture (routine x 2)     Status: Abnormal   Collection Time: 04/30/2016   7:35 AM  Result Value Ref Range Status   Specimen Description BLOOD RIGHT HAND  Final   Special Requests BOTTLES DRAWN AEROBIC ONLY 5CC  Final   Culture  Setup Time   Final    GRAM POSITIVE COCCI IN CLUSTERS AEROBIC BOTTLE ONLY Organism ID to follow CRITICAL RESULT CALLED TO, READ BACK BY AND VERIFIED WITH: J. CARNEY, PHARM AT 0740 ON [id number] BY Rhea Bleacher    Culture (A)  Final    STAPHYLOCOCCUS SPECIES (COAGULASE NEGATIVE) THE SIGNIFICANCE OF ISOLATING THIS ORGANISM FROM A SINGLE SET OF BLOOD CULTURES WHEN MULTIPLE SETS ARE DRAWN IS UNCERTAIN. PLEASE NOTIFY THE MICROBIOLOGY DEPARTMENT WITHIN ONE WEEK IF SPECIATION AND SENSITIVITIES ARE REQUIRED.    Report Status 04/24/2016 FINAL  Final  Blood Culture ID Panel (Reflexed)     Status: Abnormal   Collection Time: 04/16/2016  7:35 AM  Result Value Ref Range Status   Enterococcus species NOT DETECTED NOT DETECTED Final   Listeria monocytogenes NOT DETECTED NOT DETECTED Final   Staphylococcus species DETECTED (A) NOT DETECTED Final    Comment: CRITICAL RESULT CALLED TO, READ BACK BY AND VERIFIED WITH: J. CARNEY, [place] [id number] BY S. YARBROUGH    Staphylococcus aureus NOT DETECTED NOT DETECTED Final   Methicillin resistance DETECTED (A) NOT DETECTED Final    Comment: CRITICAL RESULT CALLED TO, READ BACK BY AND VERIFIED WITH: J. CARNEY, [place] [address] BY S. YARBROUGH    Streptococcus species NOT DETECTED NOT DETECTED Final   Streptococcus agalactiae NOT DETECTED NOT DETECTED Final   Streptococcus pneumoniae NOT DETECTED NOT DETECTED Final   Streptococcus pyogenes NOT DETECTED NOT DETECTED Final   Acinetobacter baumannii NOT DETECTED NOT DETECTED Final   Enterobacteriaceae species NOT DETECTED NOT DETECTED Final   Enterobacter cloacae complex NOT DETECTED NOT DETECTED Final   Escherichia coli NOT DETECTED NOT DETECTED Final   Klebsiella oxytoca NOT DETECTED NOT DETECTED Final   Klebsiella pneumoniae NOT DETECTED NOT  DETECTED Final   Proteus species NOT DETECTED NOT DETECTED Final   Serratia marcescens NOT DETECTED NOT DETECTED Final   Haemophilus influenzae NOT DETECTED NOT DETECTED Final   Neisseria meningitidis NOT DETECTED NOT DETECTED Final   Pseudomonas aeruginosa NOT DETECTED NOT DETECTED Final   Candida albicans NOT DETECTED NOT DETECTED Final   Candida glabrata NOT DETECTED NOT DETECTED Final   Candida krusei NOT DETECTED NOT DETECTED Final   Candida parapsilosis NOT DETECTED NOT DETECTED Final   Candida tropicalis NOT DETECTED NOT DETECTED Final  Urine culture     Status: None   Collection Time: 05/10/2016  8:41 AM  Result Value Ref Range Status   Specimen Description URINE, CATHETERIZED  Final   Special Requests NONE  Final   Culture NO GROWTH  Final   Report Status 04/22/2016 FINAL  Final  MRSA PCR Screening     Status: None   Collection Time: 05/10/2016 12:20 PM  Result Value Ref Range Status   MRSA by PCR NEGATIVE NEGATIVE Final    Comment:        The GeneXpert MRSA Assay (FDA approved for NASAL specimens only), is one component of a comprehensive MRSA colonization surveillance program. It is not intended to diagnose MRSA infection nor to guide or monitor treatment for MRSA infections.   Culture, respiratory (NON-Expectorated)     Status: None   Collection Time: 04/22/16 12:18 PM  Result Value Ref Range Status   Specimen Description TRACHEAL ASPIRATE  Final   Special Requests Normal  Final   Gram Stain   Final    MODERATE WBC PRESENT, PREDOMINANTLY PMN FEW GRAM POSITIVE COCCI IN PAIRS IN SINGLES  Culture   Final    MODERATE METHICILLIN RESISTANT STAPHYLOCOCCUS AUREUS   Report Status 04/25/2016 FINAL  Final   Organism ID, Bacteria METHICILLIN RESISTANT STAPHYLOCOCCUS AUREUS  Final      Susceptibility   Methicillin resistant staphylococcus aureus - MIC*    CIPROFLOXACIN >=8 RESISTANT Resistant     ERYTHROMYCIN >=8 RESISTANT Resistant     GENTAMICIN <=0.5 SENSITIVE  Sensitive     OXACILLIN >=4 RESISTANT Resistant     TETRACYCLINE <=1 SENSITIVE Sensitive     VANCOMYCIN 1 SENSITIVE Sensitive     TRIMETH/SULFA <=10 SENSITIVE Sensitive     CLINDAMYCIN >=8 RESISTANT Resistant     RIFAMPIN <=0.5 SENSITIVE Sensitive     Inducible Clindamycin NEGATIVE Sensitive     * MODERATE METHICILLIN RESISTANT STAPHYLOCOCCUS AUREUS  Respiratory Panel by PCR     Status: None   Collection Time: 04/22/16  1:11 PM  Result Value Ref Range Status   Adenovirus NOT DETECTED NOT DETECTED Final   Coronavirus 229E NOT DETECTED NOT DETECTED Final   Coronavirus HKU1 NOT DETECTED NOT DETECTED Final   Coronavirus NL63 NOT DETECTED NOT DETECTED Final   Coronavirus OC43 NOT DETECTED NOT DETECTED Final   Metapneumovirus NOT DETECTED NOT DETECTED Final   Rhinovirus / Enterovirus NOT DETECTED NOT DETECTED Final   Influenza A NOT DETECTED NOT DETECTED Final   Influenza B NOT DETECTED NOT DETECTED Final   Parainfluenza Virus 1 NOT DETECTED NOT DETECTED Final   Parainfluenza Virus 2 NOT DETECTED NOT DETECTED Final   Parainfluenza Virus 3 NOT DETECTED NOT DETECTED Final   Parainfluenza Virus 4 NOT DETECTED NOT DETECTED Final   Respiratory Syncytial Virus NOT DETECTED NOT DETECTED Final   Bordetella pertussis NOT DETECTED NOT DETECTED Final   Chlamydophila pneumoniae NOT DETECTED NOT DETECTED Final   Mycoplasma pneumoniae NOT DETECTED NOT DETECTED Final  C difficile quick scan w PCR reflex     Status: None   Collection Time: 04/25/16 10:56 PM  Result Value Ref Range Status   C Diff antigen NEGATIVE NEGATIVE Final   C Diff toxin NEGATIVE NEGATIVE Final   C Diff interpretation No C. difficile detected.  Final  Culture, Urine     Status: None   Collection Time: 04/30/16  9:08 AM  Result Value Ref Range Status   Specimen Description URINE, CATHETERIZED  Final   Special Requests NONE  Final   Culture NO GROWTH  Final   Report Status 05/01/2016 FINAL  Final  Culture, respiratory  (NON-Expectorated)     Status: None   Collection Time: 04/30/16  9:10 AM  Result Value Ref Range Status   Specimen Description TRACHEAL ASPIRATE  Final   Special Requests NONE  Final   Gram Stain   Final    ABUNDANT WBC PRESENT,BOTH PMN AND MONONUCLEAR ABUNDANT GRAM POSITIVE COCCI IN CLUSTERS    Culture   Final    ABUNDANT METHICILLIN RESISTANT STAPHYLOCOCCUS AUREUS   Report Status 05/02/2016 FINAL  Final   Organism ID, Bacteria METHICILLIN RESISTANT STAPHYLOCOCCUS AUREUS  Final      Susceptibility   Methicillin resistant staphylococcus aureus - MIC*    CIPROFLOXACIN >=8 RESISTANT Resistant     ERYTHROMYCIN >=8 RESISTANT Resistant     GENTAMICIN <=0.5 SENSITIVE Sensitive     OXACILLIN >=4 RESISTANT Resistant     TETRACYCLINE <=1 SENSITIVE Sensitive     VANCOMYCIN 1 SENSITIVE Sensitive     TRIMETH/SULFA <=10 SENSITIVE Sensitive     CLINDAMYCIN >=8 RESISTANT Resistant  RIFAMPIN <=0.5 SENSITIVE Sensitive     Inducible Clindamycin NEGATIVE Sensitive     * ABUNDANT METHICILLIN RESISTANT STAPHYLOCOCCUS AUREUS  Culture, blood (Routine X 2) w Reflex to ID Panel     Status: Abnormal   Collection Time: 04/30/16 12:38 PM  Result Value Ref Range Status   Specimen Description BLOOD RIGHT HAND  Final   Special Requests IN PEDIATRIC BOTTLE 4CC  Final   Culture  Setup Time   Final    GRAM POSITIVE COCCI IN CLUSTERS IN PEDIATRIC BOTTLE CRITICAL RESULT CALLED TO, READ BACK BY AND VERIFIED WITH: K AMEND,PHARMD AT 0720 05/01/16 BY L BENFIELD CRITICAL RESULT CALLED TO, READ BACK BY AND VERIFIED WITH: C BALL,PHARMD AT 0935 05/01/16 BY L BENFIELD BCID REQUESTED REPEAT BY DR VAN DAMM    Culture (A)  Final    STAPHYLOCOCCUS SPECIES (COAGULASE NEGATIVE) THE SIGNIFICANCE OF ISOLATING THIS ORGANISM FROM A SINGLE VENIPUNCTURE CANNOT BE PREDICTED WITHOUT FURTHER CLINICAL AND CULTURE CORRELATION. SUSCEPTIBILITIES AVAILABLE ONLY ON REQUEST. NO ENTEROCOCCUS WAS RECOVERED IN CULTURE    Report Status  05/04/2016 FINAL  Final  Blood Culture ID Panel (Reflexed)     Status: Abnormal   Collection Time: 04/30/16 12:38 PM  Result Value Ref Range Status   Enterococcus species DETECTED (A) NOT DETECTED Final    Comment: CRITICAL RESULT CALLED TO, READ BACK BY AND VERIFIED WITH: C BALL,PHARMD AT 0935 05/01/16 BY L BENFIELD    Vancomycin resistance NOT DETECTED NOT DETECTED Final   Listeria monocytogenes NOT DETECTED NOT DETECTED Final   Staphylococcus species DETECTED (A) NOT DETECTED Final    Comment: CRITICAL RESULT CALLED TO, READ BACK BY AND VERIFIED WITH: C BALL,PHARMD AT 0935 05/01/16 BY L BENFIELD    Staphylococcus aureus NOT DETECTED NOT DETECTED Final   Methicillin resistance DETECTED (A) NOT DETECTED Final    Comment: CRITICAL RESULT CALLED TO, READ BACK BY AND VERIFIED WITH: C BALL,PHARMD AT 0935 05/01/16 BY L BENFIELD    Streptococcus species NOT DETECTED NOT DETECTED Final   Streptococcus agalactiae NOT DETECTED NOT DETECTED Final   Streptococcus pneumoniae NOT DETECTED NOT DETECTED Final   Streptococcus pyogenes NOT DETECTED NOT DETECTED Final   Acinetobacter baumannii NOT DETECTED NOT DETECTED Final   Enterobacteriaceae species NOT DETECTED NOT DETECTED Final   Enterobacter cloacae complex NOT DETECTED NOT DETECTED Final   Escherichia coli NOT DETECTED NOT DETECTED Final   Klebsiella oxytoca NOT DETECTED NOT DETECTED Final   Klebsiella pneumoniae NOT DETECTED NOT DETECTED Final   Proteus species NOT DETECTED NOT DETECTED Final   Serratia marcescens NOT DETECTED NOT DETECTED Final   Haemophilus influenzae NOT DETECTED NOT DETECTED Final   Neisseria meningitidis NOT DETECTED NOT DETECTED Final   Pseudomonas aeruginosa NOT DETECTED NOT DETECTED Final   Candida albicans NOT DETECTED NOT DETECTED Final   Candida glabrata NOT DETECTED NOT DETECTED Final   Candida krusei NOT DETECTED NOT DETECTED Final   Candida parapsilosis NOT DETECTED NOT DETECTED Final   Candida tropicalis  NOT DETECTED NOT DETECTED Final  Culture, blood (Routine X 2) w Reflex to ID Panel     Status: None (Preliminary result)   Collection Time: 04/30/16 12:56 PM  Result Value Ref Range Status   Specimen Description BLOOD RIGHT ARM  Final   Special Requests BOTTLES DRAWN AEROBIC AND ANAEROBIC 10CC  Final   Culture NO GROWTH 4 DAYS  Final   Report Status PENDING  Incomplete    Studies/Results: Ct Abdomen Pelvis Wo Contrast  Result Date: 05/04/2016  CLINICAL DATA:  Found unresponsive at nursing home 04/29/2016 with agonal respirations, respiratory failure and multi-system failure. Sepsis. EXAM: CT CHEST, ABDOMEN AND PELVIS WITHOUT CONTRAST TECHNIQUE: Multidetector CT imaging of the chest, abdomen and pelvis was performed following the standard protocol without IV contrast. COMPARISON:  Abdominal ultrasound 04/28/2016. Abdominal pelvic CT 04/24/2016. 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 Right arm PICC extends near the confluence of the brachiocephalic veins. No acute vascular findings are demonstrated on noncontrast imaging. There are mitral annular and probable aortic valvular calcifications. The heart size is normal. There is no pericardial effusion. Mediastinum/Nodes: There are no enlarged mediastinal, hilar or axillary lymph nodes. Endotracheal tube tip is in the midtrachea. The thyroid gland and esophagus appear unremarkable. Lungs/Pleura: There is no residual pleural effusion. There are improving airspace opacities at both lung bases. There are residual reticulonodular densities in the lower lobes, greater on the right. No consolidation or suspicious pulmonary nodule. Musculoskeletal/Chest wall: There are numerous nondisplaced subacute appearing rib fractures bilaterally. 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 CT ABDOMEN AND PELVIS FINDINGS Hepatobiliary: The liver appears  unremarkable as imaged in the noncontrast state. Multiple gallstones are again noted. The gallbladder remains distended with mild wall thickening and surrounding inflammation. No significant biliary dilatation. Pancreas: Unremarkable. No pancreatic ductal dilatation or surrounding inflammatory changes. Spleen: Normal in size without focal abnormality. Adrenals/Urinary Tract: Both adrenal glands appear normal. Both kidneys appear normal without evidence of mass, urinary tract calculus or hydronephrosis. There are renovascular calcifications bilaterally. Foley catheter has been removed. There is mild bladder wall thickening. A small amount of air is present within the bladder lumen. Stomach/Bowel: No evidence of bowel wall thickening, distention or surrounding inflammatory change. Appendix not clearly seen. Percutaneous gastrostomy remains in place. The rectal tube has been removed. Vascular/Lymphatic: There are no enlarged abdominal or pelvic lymph nodes. Extensive aortic and branch vessel atherosclerosis again noted. Reproductive: The prostate gland and seminal vesicles appear unremarkable. Other: There is a small amount of ascites, primarily in both pericolic gutters. No focal extraluminal fluid collection identified. Subcutaneous and mesenteric edema has improved. Musculoskeletal: No acute or significant osseous findings. IMPRESSION: 1. Interval improvement in bibasilar airspace opacities. There are residual reticulonodular densities at both lung bases, worse on the right. This could be secondary to aspiration or atypical infection. 2. Multiple gallstones with persistent gallbladder distention and possible mild pericholecystic inflammation. Appearance is similar to previous CT, and ultrasound done 6 days ago did not show findings highly suspicious for cholecystitis. If that is a clinical concern, consider hepatobiliary scan. 3. Diffuse atherosclerosis. 4. Small amount of ascites without evidence of abscess. 5.  Nondisplaced bilateral rib fractures, probably from previous CPR. Electronically Signed   By: Carey Bullocks M.D.   On: 05/04/2016 15:28   Ct Chest Wo Contrast  Result Date: 05/04/2016 CLINICAL DATA:  Found unresponsive at nursing home 04/23/2016 with agonal respirations, respiratory failure and multi-system failure. Sepsis. EXAM: CT CHEST, ABDOMEN AND PELVIS WITHOUT CONTRAST TECHNIQUE: Multidetector CT imaging of the chest, abdomen and pelvis was performed following the standard protocol without IV contrast. COMPARISON:  Abdominal ultrasound 04/28/2016. Abdominal pelvic CT 04/24/2016. 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 Right arm PICC extends near the confluence of the brachiocephalic veins. No acute vascular findings are demonstrated on noncontrast imaging. There are mitral annular and probable aortic valvular calcifications. The heart size is  normal. There is no pericardial effusion. Mediastinum/Nodes: There are no enlarged mediastinal, hilar or axillary lymph nodes. Endotracheal tube tip is in the midtrachea. The thyroid gland and esophagus appear unremarkable. Lungs/Pleura: There is no residual pleural effusion. There are improving airspace opacities at both lung bases. There are residual reticulonodular densities in the lower lobes, greater on the right. No consolidation or suspicious pulmonary nodule. Musculoskeletal/Chest wall: There are numerous nondisplaced subacute appearing rib fractures bilaterally. 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 CT ABDOMEN AND PELVIS FINDINGS Hepatobiliary: The liver appears unremarkable as imaged in the noncontrast state. Multiple gallstones are again noted. The gallbladder remains distended with mild wall thickening and surrounding inflammation. No significant biliary dilatation. Pancreas: Unremarkable. No pancreatic ductal dilatation or  surrounding inflammatory changes. Spleen: Normal in size without focal abnormality. Adrenals/Urinary Tract: Both adrenal glands appear normal. Both kidneys appear normal without evidence of mass, urinary tract calculus or hydronephrosis. There are renovascular calcifications bilaterally. Foley catheter has been removed. There is mild bladder wall thickening. A small amount of air is present within the bladder lumen. Stomach/Bowel: No evidence of bowel wall thickening, distention or surrounding inflammatory change. Appendix not clearly seen. Percutaneous gastrostomy remains in place. The rectal tube has been removed. Vascular/Lymphatic: There are no enlarged abdominal or pelvic lymph nodes. Extensive aortic and branch vessel atherosclerosis again noted. Reproductive: The prostate gland and seminal vesicles appear unremarkable. Other: There is a small amount of ascites, primarily in both pericolic gutters. No focal extraluminal fluid collection identified. Subcutaneous and mesenteric edema has improved. Musculoskeletal: No acute or significant osseous findings. IMPRESSION: 1. Interval improvement in bibasilar airspace opacities. There are residual reticulonodular densities at both lung bases, worse on the right. This could be secondary to aspiration or atypical infection. 2. Multiple gallstones with persistent gallbladder distention and possible mild pericholecystic inflammation. Appearance is similar to previous CT, and ultrasound done 6 days ago did not show findings highly suspicious for cholecystitis. If that is a clinical concern, consider hepatobiliary scan. 3. Diffuse atherosclerosis. 4. Small amount of ascites without evidence of abscess. 5. Nondisplaced bilateral rib fractures, probably from previous CPR. Electronically Signed   By: Richardean Sale M.D.   On: 05/04/2016 15:28   Dg Chest Port 1 View  Result Date: 05/04/2016 CLINICAL DATA:  Intubation. EXAM: PORTABLE CHEST 1 VIEW COMPARISON:  05/03/2016.  FINDINGS: Endotracheal tube and right PICC line in stable position. Prior CABG. Heart size stable. No focal infiltrate. No pleural effusion or pneumothorax . IMPRESSION: 1. Lines and tubes stable position. 2. Prior CABG. Heart size stable. No acute pulmonary disease. Chest is stable from prior exam. Electronically Signed   By: Marcello Moores  Register   On: 05/04/2016 07:28   Dg Chest Port 1 View  Result Date: 05/03/2016 CLINICAL DATA:  Intubation. EXAM: PORTABLE CHEST 1 VIEW COMPARISON:  05/02/2016. FINDINGS: Right PICC line noted with tip projected over the right brachiocephalic region. Endotracheal tube tip in unchanged position approximately 1.7 cm above the lower portion of the carina. Withdrawal of approximately 2 cm should be considered. Low lung volumes. Mild infiltrate right lung base cannot be excluded. Prior CABG . Heart size stable . No acute bony abnormality . IMPRESSION: 1. Endotracheal tube tip 1.7 cm above the carina. Proximal repositioning of approximately 2 cm should be considered. Right PICC line in unchanged position. 2. Prior CABG.  Heart size stable. 3. Low lung volumes. Mild right base infiltrate cannot be excluded on today's exam . Electronically Signed   By: Marcello Moores  Register   On: 05/03/2016 08:03   Micro:  10/21 blood cx: 1 of 2 sets showing GPC, but BCID suggesting MRSE and enterococcus 10/21trach aspirate: showing MRSA  Assessment/Plan:   Active Problems:   Sepsis ([place])   Acute respiratory failure with hypoxia (HCC)   Endotracheally intubated   History of ETT   Hyperkalemia   RUQ pain   Transaminitis   FUO (fever of unknown origin)   Diabetic foot ulcer ([place])   Coag negative Staphylococcus bacteremia   Bacteremia due to Enterococcus    Clayton [name] is a 60 y.o. male with  esident with hx CHF, CAD s/p remote stent, DM, HTN, Aflutter s/p ablation WITH PACEMAKER  on coumadin, severe PVD s/p R BKA, being treated for possible infection at SNF with rocephin. Presented  10/12 from SNF after being found unresponsive with agonal breaths sp broad-spectrum antibiotics of vancomycin and cefepime narrowed to vancomycin after MRSA isolated from his resp cultures still on ventilator and still some low-grade temperatures. He is now growing an Enterococcus and MR Coag neg staph from 1/2 cultures on 10/21  #1 FUO: still difficult to identify source be it pneumonia vs. DFU. He is currently afebrile. On day #11 of vanco to treat MRSA bacteremia, though chest CT shows much improvement  #2 Bacteremia: blood cx from 10/21 likely contaminant. Discontinue ampicillin last night  #3: MRSA pneumonia = would treat for a total of 14 days with vancomcyin  #3 DFU:if still febrile, would recommend to CT his right foot as potential source of infection    LOS: 13 days   Hardy Harcum 05/04/2016, 4:19 PM

## 2016-05-05 ENCOUNTER — Inpatient Hospital Stay (HOSPITAL_COMMUNITY): Payer: Medicare HMO

## 2016-05-05 DIAGNOSIS — E13621 Other specified diabetes mellitus with foot ulcer: Secondary | ICD-10-CM

## 2016-05-05 DIAGNOSIS — L97509 Non-pressure chronic ulcer of other part of unspecified foot with unspecified severity: Secondary | ICD-10-CM

## 2016-05-05 LAB — GLUCOSE, CAPILLARY
GLUCOSE-CAPILLARY: 192 mg/dL — AB (ref 65–99)
GLUCOSE-CAPILLARY: 229 mg/dL — AB (ref 65–99)
Glucose-Capillary: 172 mg/dL — ABNORMAL HIGH (ref 65–99)
Glucose-Capillary: 199 mg/dL — ABNORMAL HIGH (ref 65–99)
Glucose-Capillary: 202 mg/dL — ABNORMAL HIGH (ref 65–99)
Glucose-Capillary: 224 mg/dL — ABNORMAL HIGH (ref 65–99)

## 2016-05-05 LAB — BASIC METABOLIC PANEL
ANION GAP: 8 (ref 5–15)
Anion gap: 9 (ref 5–15)
BUN: 67 mg/dL — ABNORMAL HIGH (ref 6–20)
BUN: 58 mg/dL — ABNORMAL HIGH (ref 6–20)
CALCIUM: 8.2 mg/dL — AB (ref 8.9–10.3)
CALCIUM: 8.5 mg/dL — AB (ref 8.9–10.3)
CHLORIDE: 109 mmol/L (ref 101–111)
CO2: 27 mmol/L (ref 22–32)
CO2: 26 mmol/L (ref 22–32)
CREATININE: 1.22 mg/dL (ref 0.61–1.24)
CREATININE: 1.15 mg/dL (ref 0.61–1.24)
Chloride: 109 mmol/L (ref 101–111)
GFR calc Af Amer: >60 mL/min (ref >60)
GFR calc non Af Amer: >60 mL/min (ref >60)
GLUCOSE: 191 mg/dL — AB (ref 65–99)
GLUCOSE: 238 mg/dL — AB (ref 65–99)
Potassium: 2.6 mmol/L — CL (ref 3.5–5.1)
Potassium: 3.4 mmol/L — ABNORMAL LOW (ref 3.5–5.1)
Sodium: 144 mmol/L (ref 135–145)
Sodium: 144 mmol/L (ref 135–145)

## 2016-05-05 LAB — BLOOD GAS, ARTERIAL
Acid-Base Excess: 1.8 mmol/L (ref 0.0–2.0)
BICARBONATE: 24.6 mmol/L (ref 20.0–28.0)
Drawn by: 31101
FIO2: 40
LHR: 22 {breaths}/min
O2 Saturation: 99.7 %
PATIENT TEMPERATURE: 98.6
PCO2 ART: 30.7 mmHg — AB (ref 32.0–48.0)
PEEP: 5 cmH2O
VT: 580 mL
pH, Arterial: 7.515 — ABNORMAL HIGH (ref 7.350–7.450)
pO2, Arterial: 174 mmHg — ABNORMAL HIGH (ref 83.0–108.0)

## 2016-05-05 LAB — CBC
HCT: 30.1 % — ABNORMAL LOW (ref 39.0–52.0)
HEMOGLOBIN: 8.9 g/dL — AB (ref 13.0–17.0)
MCH: 26.9 pg (ref 26.0–34.0)
MCHC: 29.6 g/dL — AB (ref 30.0–36.0)
MCV: 90.9 fL (ref 78.0–100.0)
PLATELETS: 336 10*3/uL (ref 150–400)
RBC: 3.31 MIL/uL — ABNORMAL LOW (ref 4.22–5.81)
RDW: 18.8 % — ABNORMAL HIGH (ref 11.5–15.5)
WBC: 16.8 10*3/uL — ABNORMAL HIGH (ref 4.0–10.5)

## 2016-05-05 LAB — DIFFERENTIAL
Basophils Absolute: 0 10*3/uL (ref 0.0–0.1)
Basophils Relative: 0 %
EOS ABS: 0.3 10*3/uL (ref 0.0–0.7)
EOS PCT: 2 %
LYMPHS ABS: 2.3 10*3/uL (ref 0.7–4.0)
Lymphocytes Relative: 14 %
MONOS PCT: 3 %
Monocytes Absolute: 0.6 10*3/uL (ref 0.1–1.0)
Neutro Abs: 13.9 10*3/uL — ABNORMAL HIGH (ref 1.7–7.7)
Neutrophils Relative %: 81 %

## 2016-05-05 LAB — MAGNESIUM: MAGNESIUM: 1.8 mg/dL (ref 1.7–2.4)

## 2016-05-05 LAB — CULTURE, BLOOD (ROUTINE X 2): CULTURE: NO GROWTH

## 2016-05-05 LAB — HEPARIN LEVEL (UNFRACTIONATED): HEPARIN UNFRACTIONATED: 0.54 [IU]/mL (ref 0.30–0.70)

## 2016-05-05 LAB — PHOSPHORUS: PHOSPHORUS: 3.5 mg/dL (ref 2.5–4.6)

## 2016-05-05 MED ORDER — LOPERAMIDE HCL 1 MG/5ML PO LIQD
2.0000 mg | Freq: Three times a day (TID) | ORAL | Status: DC | PRN
  Filled 2016-05-05: qty 10

## 2016-05-05 MED ORDER — LOPERAMIDE HCL 1 MG/5ML PO LIQD: 2.0000 mg | Freq: Three times a day (TID) | ORAL | Status: DC | PRN

## 2016-05-05 MED ORDER — FUROSEMIDE 10 MG/ML IJ SOLN
40.0000 mg | Freq: Four times a day (QID) | INTRAMUSCULAR | Status: AC
  Administered 2016-05-05 (×3): 40 mg via INTRAVENOUS
  Filled 2016-05-05 (×4): qty 4

## 2016-05-05 MED ORDER — METOPROLOL TARTRATE 5 MG/5ML IV SOLN
2.5000 mg | INTRAVENOUS | Status: DC | PRN
  Administered 2016-05-05 – 2016-05-06 (×3): 2.5 mg via INTRAVENOUS
  Filled 2016-05-05 (×2): qty 5

## 2016-05-05 MED ORDER — AMIODARONE HCL IN DEXTROSE 360-4.14 MG/200ML-% IV SOLN
60.0000 mg/h | INTRAVENOUS | Status: AC
  Administered 2016-05-05 (×2): 60 mg/h via INTRAVENOUS
  Filled 2016-05-05 (×2): qty 200

## 2016-05-05 MED ORDER — AMIODARONE LOAD VIA INFUSION
150.0000 mg | Freq: Once | INTRAVENOUS | Status: AC
  Administered 2016-05-05: 150 mg via INTRAVENOUS
  Filled 2016-05-05: qty 83.34

## 2016-05-05 MED ORDER — POTASSIUM CHLORIDE 20 MEQ/15ML (10%) PO SOLN
40.0000 meq | Freq: Two times a day (BID) | ORAL | Status: AC
  Administered 2016-05-05: 40 meq via ORAL
  Filled 2016-05-05: qty 30

## 2016-05-05 MED ORDER — POTASSIUM CHLORIDE 20 MEQ/15ML (10%) PO SOLN
40.0000 meq | Freq: Three times a day (TID) | ORAL | Status: DC
  Administered 2016-05-05: 40 meq
  Filled 2016-05-05: qty 30

## 2016-05-05 MED ORDER — POTASSIUM CHLORIDE 10 MEQ/50ML IV SOLN
10.0000 meq | INTRAVENOUS | Status: DC
  Administered 2016-05-05 (×3): 10 meq via INTRAVENOUS
  Filled 2016-05-05 (×3): qty 50

## 2016-05-05 MED ORDER — MAGNESIUM SULFATE 2 GM/50ML IV SOLN
2.0000 g | Freq: Once | INTRAVENOUS | Status: AC
  Administered 2016-05-05: 2 g via INTRAVENOUS
  Filled 2016-05-05: qty 50

## 2016-05-05 MED ORDER — POTASSIUM CHLORIDE 10 MEQ/50ML IV SOLN
10.0000 meq | INTRAVENOUS | Status: AC
  Administered 2016-05-05 (×4): 10 meq via INTRAVENOUS
  Filled 2016-05-05 (×4): qty 50

## 2016-05-05 MED ORDER — AMIODARONE HCL IN DEXTROSE 360-4.14 MG/200ML-% IV SOLN
30.0000 mg/h | INTRAVENOUS | Status: DC
  Administered 2016-05-05 – 2016-05-07 (×3): 30 mg/h via INTRAVENOUS
  Filled 2016-05-05 (×10): qty 200

## 2016-05-05 NOTE — Progress Notes (Signed)
eLink Physician-Brief Progress Note Patient Name: Ree KidaLarry K Setterlund DOB: 09-22-1955 MRN: [id number]   Date of Service  05/05/2016  HPI/Events of Note  Remains in AFIB with RVR (130's), BP = 105/61. No real response to Metoprolol IV.   eICU Interventions  Will order: 1. Amiodarone IV bolus and infusion.      Intervention Category Major Interventions: Arrhythmia - evaluation and management  Sommer,Steven Eugene 05/05/2016, 4:51 AM

## 2016-05-05 NOTE — Progress Notes (Signed)
eLink Physician-Brief Progress Note Patient Name: Ree KidaLarry K Recktenwald DOB: Jul 21, 1955 MRN: [id number]   Date of Service  05/05/2016  HPI/Events of Note  K+ = 3.4 and Creatinine = 1.15.  eICU Interventions  Will replace K+.     Intervention Category Intermediate Interventions: Electrolyte abnormality - evaluation and management  Lova Urbieta Eugene 05/05/2016, 5:49 PM

## 2016-05-05 NOTE — Progress Notes (Signed)
ANTICOAGULATION CONSULT NOTE  Pharmacy Consult for Heparin Indication: atrial fibrillation  Allergies  Allergen Reactions  . Niaspan [Niacin Er] Hives and Other (See Comments)    Reaction:  Facial redness/hotness     Patient Measurements: Height: 5\' 10"  (177.8 cm) Weight: 207 lb 0.2 oz (93.9 kg) IBW/kg (Calculated) : 73  Heparin Wt: 97 kg  Vital Signs: Temp: 100.5 F (38.1 C) (10/26 0747) Temp Source: Oral (10/26 0747) BP: 133/71 (10/26 0742) Pulse Rate: 97 (10/26 0742)  Labs:  Recent Labs  05/03/16 0420 05/03/16 2155 05/04/16 0430 05/05/16 0458  HGB 7.8*  --  8.6* 8.9*  HCT 26.9*  --  30.2* 30.1*  PLT 235  --  267 336  HEPARINUNFRC 0.64  --  0.63 0.54  CREATININE 1.21 1.39* 1.39* 1.22    Estimated Creatinine Clearance: 72.3 mL/min (by C-G formula based on SCr of 1.22 mg/dL).  Assessment: [age] yoM with atrial flutter on warfarin PTA now on heparin per pharmacy dosing. Abdominal US negative for a bleed. Heparin level continues to be therapeutic on 2000 units/hr at 0.54 this morning. No issues with bleeding noted, Hgb back up to 8.9, platelets remain stable.   Goal of Therapy: Heparin level 0.3 - 0.7 units/mL Monitor platelets by anticoagulation protocol: Yes  Plan:  - Continue heparin 2000 units/hr - Monitor daily heparin level, CBC, S/Sx bleeding  Dannial MonarchHailey Dierdra Salameh, PharmD Candidate 05/05/2016

## 2016-05-05 NOTE — Progress Notes (Signed)
eLink Physician-Brief Progress Note Patient Name: Ree KidaLarry K Cassatt DOB: [birth year] MRN: [id number]   Date of Service  05/05/2016  HPI/Events of Note  AFIB - Ventricular rate 110's to 130's. BP = 106/90.  eICU Interventions  Will change Metoprolol dose to 2.5 mg IV Q 2 hours PRN HR > 130.     Intervention Category Major Interventions: Arrhythmia - evaluation and management  Sommer,Steven Eugene 05/05/2016, 12:59 AM

## 2016-05-05 NOTE — Progress Notes (Signed)
eLink Physician-Brief Progress Note Patient Name: Clayton [name] DOB: [birth year] MRN: [id number]   Date of Service  05/05/2016  HPI/Events of Note  K+ = 2.6 and Creatinine = 1.22.  eICU Interventions  Will order: 1. Replace K+. 2. BMP at 2 PM.      Intervention Category Intermediate Interventions: Electrolyte abnormality - evaluation and management  Sommer,Steven Eugene 05/05/2016, 6:10 AM

## 2016-05-05 NOTE — Progress Notes (Signed)
PULMONARY / CRITICAL CARE MEDICINE   Name: Clayton [name] MRN: [id number] DOB: 03-Sep-1955    ADMISSION DATE:  05/04/2016  REFERRING MD:  Alvino Chapel (EDP)   CHIEF COMPLAINT:  Sepsis, respiratory failure   HISTORY OF PRESENT ILLNESS:   60yo male SNF resident with hx CHF, CAD s/p remote stent, DM, HTN, Aflutter s/p ablation on coumadin, severe PVD s/p R BKA, being treated for unknown infection at SNF with rocephin.  Presented 10/12 from SNF after being found unresponsive with agonal breaths.  Bagged by EMS en route and intubated on arrival to ER.  In ER was febrile to 104, hypotensive with SBP 70's.  Labs notable for AKI with K 7.1, glucose 500, WBC 36, lactate 3.6.  PCCM consulted for admit.   SUBJECTIVE:  Patient noted to have HR in 120s-130s despite one dose of IV metop, and PRN pain meds. EKG obtained seemed consistent with Aflutter. He was started on amiodarone drip (with x1 bolus). His electrolytes were normal. HR has been stable since then. His blood pressures started to be in low normal range since he had elevated heart rate. They continue to be soft even after HR is better controlled. However MAPs have mainly been 70s to 80 with two intermittent MAP in 64 and 62. He continues to spike fevers throughout the day with max up to 101.F. He is back to NSR this AM.   REVIEW OF SYSTEMS:  Answers questions with nodding/shanking of head. No pain.   VITAL SIGNS: BP 133/71   Pulse 97   Temp (!) 100.5 F (38.1 C) (Oral)   Resp (!) 22   Ht _0  (1.778 m)   Wt 93.9 kg (207 lb 0.2 oz)   SpO2 100%   BMI 29.70 kg/m   HEMODYNAMICS:    VENTILATOR SETTINGS: Vent Mode: PRVC FiO2 (%):  [40 %] 40 % Set Rate:  [22 bmp] 22 bmp Vt Set:  [580 mL] 580 mL PEEP:  [5 cmH20] 5 cmH20 Plateau Pressure:  [20 [id number] cmH20] 20 cmH20  INTAKE / OUTPUT: I/O last 3 completed shifts: In: 5819.5 [I.V.:1629.5; YSAYT:0160; NG/GT:2700; IV Piggyback:250] Out: 3080 [Urine:3080]  PHYSICAL EXAMINATION:   General:  NAD  Neuro:  Answers questions with nodding or shaking of head  HEENT:  ETT in place. No scleral icterus or injection. Cardiovascular: regular rhythm. No appreciable JVD. Lungs: coarse breath sounds. Symmetric chest rise on ventilator.  Abdomen:  Normal bowel bowel sounds. PEG tube in place without surrounding erythema, Nontender, + BS Musculoskeletal:  Warm and dry. 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 Right foot is wrapped in soft support.   LABS:  BMET  Recent Labs Lab 05/03/16 2155 05/04/16 0430 05/05/16 0458  NA 153* 152* 144  K 3.6 3.9 2.6*  CL 116* 113* 109  CO2 _1 BUN 64* 68* 67*  CREATININE 1.39* 1.39* 1.22  GLUCOSE 254* 314* 191*    Electrolytes  Recent Labs Lab 05/03/16 0420 05/03/16 2155 05/04/16 0020 05/04/16 0430 05/05/16 0458  CALCIUM 8.1* 8.6*  --  8.6* 8.2*  MG 2.3  --  1.9 2.0 1.8  PHOS 3.0  --   --  3.7 3.5    CBC  Recent Labs Lab 05/03/16 0420 05/04/16 0430 05/05/16 0458  WBC 9.4 12.1* 16.8*  HGB 7.8* 8.6* 8.9*  HCT 26.9* 30.2* 30.1*  PLT 235 267 336    Coag's No results for input(s): APTT, INR in the last 168 hours.  Sepsis Markers  Recent Labs Lab  04/29/16 1000 04/29/16 1120 05/01/16 0321 05/03/16 0420  LATICACIDVEN 1.8  --   --   --   PROCALCITON  --  0.71 0.34 0.24    ABG  Recent Labs Lab 05/03/16 0402 05/04/16 0341 05/05/16 0405  PHART 7.551* 7.500* 7.515*  PCO2ART 29.9* 32.5 30.7*  PO2ART 164* 179* 174*    Liver Enzymes  Recent Labs Lab 04/29/16 1120  05/01/16 0321 05/02/16 0415 05/03/16 0420  AST 52*  --   --   --   --   ALT 54  --   --   --   --   ALKPHOS 142*  --   --   --   --   BILITOT 0.9  --   --   --   --   ALBUMIN 1.8*  < > 2.1* 2.3* 2.2*  < > = values in this interval not displayed.  Cardiac Enzymes No results for input(s): TROPONINI, PROBNP in the last 168 hours.  Glucose  Recent Labs Lab 05/04/16 1220 05/04/16 1528 05/04/16 2013 05/04/16 2316  05/05/16 0416 05/05/16 0745  GLUCAP 206* 115* 129* 182* 172* 192*    Imaging Ct Abdomen Pelvis Wo Contrast  Result Date: 05/04/2016 CLINICAL DATA:  Found unresponsive at nursing home 05/07/2016 with agonal respirations, respiratory failure and multi-system failure. Sepsis. EXAM: CT CHEST, ABDOMEN AND PELVIS WITHOUT CONTRAST TECHNIQUE: Multidetector CT imaging of the chest, abdomen and pelvis was performed following the standard protocol without IV contrast. COMPARISON:  Abdominal ultrasound 04/28/2016. Abdominal pelvic CT 04/24/2016. 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 Right arm PICC extends near the confluence of the brachiocephalic veins. No acute vascular findings are demonstrated on noncontrast imaging. There are mitral annular and probable aortic valvular calcifications. The heart size is normal. There is no pericardial effusion. Mediastinum/Nodes: There are no enlarged mediastinal, hilar or axillary lymph nodes. Endotracheal tube tip is in the midtrachea. The thyroid gland and esophagus appear unremarkable. Lungs/Pleura: There is no residual pleural effusion. There are improving airspace opacities at both lung bases. There are residual reticulonodular densities in the lower lobes, greater on the right. No consolidation or suspicious pulmonary nodule. Musculoskeletal/Chest wall: There are numerous nondisplaced subacute appearing rib fractures bilaterally. 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 CT ABDOMEN AND PELVIS FINDINGS Hepatobiliary: The liver appears unremarkable as imaged in the noncontrast state. Multiple gallstones are again noted. The gallbladder remains distended with mild wall thickening and surrounding inflammation. No significant biliary dilatation. Pancreas: Unremarkable. No pancreatic ductal dilatation or surrounding inflammatory changes. Spleen: Normal in size  without focal abnormality. Adrenals/Urinary Tract: Both adrenal glands appear normal. Both kidneys appear normal without evidence of mass, urinary tract calculus or hydronephrosis. There are renovascular calcifications bilaterally. Foley catheter has been removed. There is mild bladder wall thickening. A small amount of air is present within the bladder lumen. Stomach/Bowel: No evidence of bowel wall thickening, distention or surrounding inflammatory change. Appendix not clearly seen. Percutaneous gastrostomy remains in place. The rectal tube has been removed. Vascular/Lymphatic: There are no enlarged abdominal or pelvic lymph nodes. Extensive aortic and branch vessel atherosclerosis again noted. Reproductive: The prostate gland and seminal vesicles appear unremarkable. Other: There is a small amount of ascites, primarily in both pericolic gutters. No focal extraluminal fluid collection identified. Subcutaneous and mesenteric edema has improved. Musculoskeletal: No acute or significant osseous findings. IMPRESSION: 1. Interval improvement in bibasilar airspace opacities. There are residual reticulonodular densities at  both lung bases, worse on the right. This could be secondary to aspiration or atypical infection. 2. Multiple gallstones with persistent gallbladder distention and possible mild pericholecystic inflammation. Appearance is similar to previous CT, and ultrasound done 6 days ago did not show findings highly suspicious for cholecystitis. If that is a clinical concern, consider hepatobiliary scan. 3. Diffuse atherosclerosis. 4. Small amount of ascites without evidence of abscess. 5. Nondisplaced bilateral rib fractures, probably from previous CPR. Electronically Signed   By: Richardean Sale M.D.   On: 05/04/2016 15:28   Ct Chest Wo Contrast  Result Date: 05/04/2016 CLINICAL DATA:  Found unresponsive at nursing home 05/06/2016 with agonal respirations, respiratory failure and multi-system failure.  Sepsis. EXAM: CT CHEST, ABDOMEN AND PELVIS WITHOUT CONTRAST TECHNIQUE: Multidetector CT imaging of the chest, abdomen and pelvis was performed following the standard protocol without IV contrast. COMPARISON:  Abdominal ultrasound 04/28/2016. Abdominal pelvic CT 04/24/2016. 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 Right arm PICC extends near the confluence of the brachiocephalic veins. No acute vascular findings are demonstrated on noncontrast imaging. There are mitral annular and probable aortic valvular calcifications. The heart size is normal. There is no pericardial effusion. Mediastinum/Nodes: There are no enlarged mediastinal, hilar or axillary lymph nodes. Endotracheal tube tip is in the midtrachea. The thyroid gland and esophagus appear unremarkable. Lungs/Pleura: There is no residual pleural effusion. There are improving airspace opacities at both lung bases. There are residual reticulonodular densities in the lower lobes, greater on the right. No consolidation or suspicious pulmonary nodule. Musculoskeletal/Chest wall: There are numerous nondisplaced subacute appearing rib fractures bilaterally. 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 CT ABDOMEN AND PELVIS FINDINGS Hepatobiliary: The liver appears unremarkable as imaged in the noncontrast state. Multiple gallstones are again noted. The gallbladder remains distended with mild wall thickening and surrounding inflammation. No significant biliary dilatation. Pancreas: Unremarkable. No pancreatic ductal dilatation or surrounding inflammatory changes. Spleen: Normal in size without focal abnormality. Adrenals/Urinary Tract: Both adrenal glands appear normal. Both kidneys appear normal without evidence of mass, urinary tract calculus or hydronephrosis. There are renovascular calcifications bilaterally. Foley catheter has been removed. There is  mild bladder wall thickening. A small amount of air is present within the bladder lumen. Stomach/Bowel: No evidence of bowel wall thickening, distention or surrounding inflammatory change. Appendix not clearly seen. Percutaneous gastrostomy remains in place. The rectal tube has been removed. Vascular/Lymphatic: There are no enlarged abdominal or pelvic lymph nodes. Extensive aortic and branch vessel atherosclerosis again noted. Reproductive: The prostate gland and seminal vesicles appear unremarkable. Other: There is a small amount of ascites, primarily in both pericolic gutters. No focal extraluminal fluid collection identified. Subcutaneous and mesenteric edema has improved. Musculoskeletal: No acute or significant osseous findings. IMPRESSION: 1. Interval improvement in bibasilar airspace opacities. There are residual reticulonodular densities at both lung bases, worse on the right. This could be secondary to aspiration or atypical infection. 2. Multiple gallstones with persistent gallbladder distention and possible mild pericholecystic inflammation. Appearance is similar to previous CT, and ultrasound done 6 days ago did not show findings highly suspicious for cholecystitis. If that is a clinical concern, consider hepatobiliary scan. 3. Diffuse atherosclerosis. 4. Small amount of ascites without evidence of abscess. 5. Nondisplaced bilateral rib fractures, probably from previous CPR. Electronically Signed   By: Richardean Sale M.D.   On: 05/04/2016 15:28   Dg Chest Port 1 View  Result Date: 05/05/2016 CLINICAL DATA:  Intubation. EXAM: PORTABLE CHEST 1 VIEW COMPARISON:  CT 05/04/2016.  Chest x-ray 05/04/2016, 05/03/2016. FINDINGS: Endotracheal tube and right PICC line in stable position. Prior CABG. Heart size normal. Near complete clearing of basilar interstitial infiltrates. No pleural effusion or pneumothorax. IMPRESSION: 1.  Lines and tubes in stable position. 2. Prior CABG.  Heart size stable. 3.  Near  complete clearing of basilar interstitial infiltrates. Electronically Signed   By: Marcello Moores  Register   On: 05/05/2016 07:22     STUDIES:  Renal U/S 10/12:  No abnormality or hydronephrosis.  TTE 10/13:  Difficult study. Mild LVH w/ EF 55%. Possible akinesis apical myocardium without obvious thrombus.Moderate aortic stenosis w/ mild mitral stenosis. RV poorly visualized. CT abdomen 10/15: no evidence for retroperitoneal hemorrhage. RLL central airspace disease compatible with aspiration and/or PNA CXR: 10/17: unchanged from 10/16. No consolidation. Final read pending.  RUQ [place] 10/18: Cholelithiasis without sonographic evidence acute cholecystitis. A hepatobiliary scintigraphy may provide better evaluation of the gallbladder if an acute cholecystitis is clinically suspected. Fatty Liver.  CXR 10/19: No pulmonary edema. Stable endotracheal tube position. Hazy right infrahilar and right base atelectasis or infiltrate CXR 10/22: no acute abnormality  X-ray R Foot 10/21: no osteomyelitis   HIV 10/22: non reactive  Hep C 10/22: negative Hep B 10/22: pending   MICROBIOLOGY: MRSA PCR 10/12:  Negative Blood Ctx x2 10/12 >> 1/2 Coag Neg Staph; 1/2 NG x 5 days  Urine Ctx 10/12:  Negative  Tracheal Asp Ctx 10/13 >> MRSA Respiratory Viral Panel PCR 10/13:  Negative  Blood Cx 10/21: PCR enterococcus, coag neg staph MR. Cx- coag neg staph in one botle; 2nd bottle No growth x 3 days Tracheal Aspirate cx 10/21: MRSA Urine Culture 10/21: No growth final   ANTIBIOTICS: Zosyn 10/12 x1 dose; 10/16 >>10/17 Vancomycin 10/12 >>10/18 Cefepime 10/12 >> 10/16 Vancomycin 10/22 >> Ampicillin 10/22 >>10/24  SIGNIFICANT EVENTS: 10/12 - Admit & intubation in ED 10/14 - switched from Levo to Neo due to intermittent tachycardia  10/15 - off pressor 10/17 - Failed wean 10/18 - failed wean started lasix; Completed Vanc for PNA  10/19 - Failed wean  10/20 - continues to have fevers despite completing abx therapy  for PNA. ID consulted.  ESR and CRP elevated.  10/22 - possible bacteremia with Enterococcus/MRSA on PCA with pending cultures. Started on Vanc and Ampicillin; Lasix switched from TID to BID.  10/24 - discontinued Ampicillin per ID. Had Aflutter started on amiodarone drip.    LINES/TUBES: OETT 7.5 10/12 >>  RUE PICC (SNF) 10/11 >>   Foley 10/12 >> PEG >> PIV x1  ASSESSMENT / PLAN:  Mr. Dragan is a  60 y.o. male with PMH CHF, CAD s/p remote stent, DM, HTN, Aflutter s/p ablation on anticoagulation, severe PVD s/p R BKA. Presenting with septic shock likely from MRSA PNA. Is on Heparin for history of a flutter.  Attempted SBT but did not pass; Possibly fluid overloaded therefore diuresing wtih Lasix. Continues to have fevers despite completing antibiotic therapy for MRSA PNA. He was found to have possible bacteremia with Enterococcus/coagulase neg MR staph on PCR in 1/2 bottles with pending cultures from 10/22 and started on Vancomycin and Ampicillin. However this grew coag negative staph in one bottle which is likely a contaminant and therefore Ampicillin was discontinued 10/24, and Vancomycin was continued for treatment of MRSA PNA. Despite being on antibiotics he still continues to spike fevers. He also developed atrial flutter overnight but HR now controlled with amiodarone.  He had soft blood pressures possibly due to overdiuresis.   INFECTIOUS A: Severe Sepsis, resolved RLL MRSA PNA: tracheal aspirate culture with MRSA which was treated but repeat cx grew MRSA. Continued fevers: unlikely bacteremia thus far as 1 bottle with coag negative staph.   P:   S/p Vancomycin for 7 days  S/p Cefepime and Zosyn complete Restarted Vanc 10/22 > Ampicillin 10/22 >>>10/24 ID Consulted: Would treat for total of 14 days of Vancomycin for MRSA PNA Hepatitis Panel HCV negative HBV S ag negative  CT of chest, abdomen and pelvis negative for abscess  PULMONARY A: Acute Hypoxic Respiratory Failure - likely  due to PNA Respiratory Alkalosis - improving  P:   Full Vent Support, failed weaning this AM. Failed to get family to decide on trach/peg, social work attempting to find additional phone numbers. Intermittent CXR & ABG. Albuterol nebs prn.  CARDIOVASCULAR A: Hypotension: Maps mainly in the 70s. Likely due to diuresis  Shock - Likely septic. Resolved and off presors.  Elevated Troponin I - Likely demand ischemia. Echo was difficult to interpret. Questionable apical akinesis. Aortic Stenosis - Seen on poor quality TTE. H/O CAD w/ Stent H/O CHF - Dilated LV w/ decreased function November 2016. A Flutter - S/P ablation, had episode overnight 10/24 but NSR this AM after starting amiodarone, QTC 463 this AM (from 453)  P:  Amiodarone drip > d/c. Heparin drip. Continuous telemetry monitoring. Vitals per unit protocol. ASA 24m VT daily.  RENAL A: Acute Renal Failure - Creatinine slightly increased with diuresis Had renal [place] in [place] stay which was unremarkable. Hypernatremia - improving  Hypophosphatemia - Resolved Hypokalemia - resolved  Hypomagnesemia - resolved Lactic Acidosis - Resolved. AGMA - Resolved.   P:   Monitoring UOP with Foley. Trending Electrolytes and Renal function. Free water 250cc q 4 hrs. Lasix 40 mg IV q6 x3 doses.  GASTROINTESTINAL A: Loose stools: c diff negative  RUQ tenderness : RUQ [place] chole[place] without sonographic evidence of cholecystitis. Tenderness resolved.  RLQ Tenderness: Resolved; initially reported by patient  P:   NPO Tube feeds Protonix VT daily  HEMATOLOGIC A: Leukocytosis -  resolved; in the setting of pneumonia   Anemia - stable hgb. S/p 1 unit pRBC 10/16. No signs of active bleeding  Coagulopathy - resolved; on coumadin at home  P:  Trending cell counts daily w/ CBC. Transfuse for Hgb <7.0. Heparin Drip.  ENDOCRINE A: H/O DM - A1c 10.6 in June 2017. A1c 7.0 this admission. CBGs in 200s in the  setting of tube feeds   P:   SSI resistant Scheduled Novolog 5 units q 4 hrs (only with tube feeds)  Lantus 40 Units daily Accu-Checks q4hr  NEUROLOGIC A: Acute Encephalopathy - Multifactorial from hypoxia and probable toxic metabolic. Sedation on Ventilator  P:   RASS goal: 0 to -1. Precedex gtt d/c 10/25. Versed IV prn sedation. Klonipin 1 BID.  INTEGUMENT:  A: Sacral Pressure Injury  P: - Wound care: Santyl with saline moistened gauze daily covered with dry gauze   Contacted family today, sister is leaning towards no trach, she will call son and [place] with [place] on tr[place]/peg vs withdrawal.  [place]  The patient is critically ill with multiple organ systems failure and requires high complexity decision making for assessment and support, frequent evaluation and titration of therapies, application of advanced monitoring technologies and extensive interpretation of multiple databases.   Critical Care Time devoted  to patient care services described in this note is  35  Minutes. This time reflects time of care of this signee Dr Jennet Maduro. This critical care time does not reflect procedure time, or teaching time or supervisory time of PA/NP/Med student/Med Resident etc but could involve care discussion time.  Rush Farmer, M.D. [facility] Pulmonary/Critical Care Medicine. Pager: [phone]. After hours pager: [phone].

## 2016-05-05 NOTE — Progress Notes (Signed)
CSW continues to follow for disposition and return to SNF when medically appropriate.          Lance MussAshley Gardner,MSW, LCSW [facility]/28M Clinical Social Worker [phone]

## 2016-05-06 ENCOUNTER — Inpatient Hospital Stay (HOSPITAL_COMMUNITY): Payer: Medicare HMO

## 2016-05-06 LAB — MAGNESIUM: MAGNESIUM: 1.9 mg/dL (ref 1.7–2.4)

## 2016-05-06 LAB — BLOOD GAS, ARTERIAL
ACID-BASE EXCESS: 2 mmol/L (ref 0.0–2.0)
Bicarbonate: 25 mmol/L (ref 20.0–28.0)
DRAWN BY: 31161
FIO2: 40
LHR: 22 {breaths}/min
MECHVT: 580 mL
O2 Saturation: 99.6 %
PATIENT TEMPERATURE: 98.6
PCO2 ART: 32.1 mmHg (ref 32.0–48.0)
PEEP/CPAP: 5 cmH2O
PO2 ART: 165 mmHg — AB (ref 83.0–108.0)
pH, Arterial: 7.502 — ABNORMAL HIGH (ref 7.350–7.450)

## 2016-05-06 LAB — GLUCOSE, CAPILLARY
GLUCOSE-CAPILLARY: 240 mg/dL — AB (ref 65–99)
GLUCOSE-CAPILLARY: 194 mg/dL — AB (ref 65–99)
GLUCOSE-CAPILLARY: 218 mg/dL — AB (ref 65–99)
GLUCOSE-CAPILLARY: 194 mg/dL — AB (ref 65–99)
Glucose-Capillary: 248 mg/dL — ABNORMAL HIGH (ref 65–99)
Glucose-Capillary: 255 mg/dL — ABNORMAL HIGH (ref 65–99)

## 2016-05-06 LAB — CBC
HCT: 27.5 % — ABNORMAL LOW (ref 39.0–52.0)
Hemoglobin: 8.3 g/dL — ABNORMAL LOW (ref 13.0–17.0)
MCH: 27.3 pg (ref 26.0–34.0)
MCHC: 30.2 g/dL (ref 30.0–36.0)
MCV: 90.5 fL (ref 78.0–100.0)
PLATELETS: 280 10*3/uL (ref 150–400)
RBC: 3.04 MIL/uL — ABNORMAL LOW (ref 4.22–5.81)
RDW: 18.9 % — ABNORMAL HIGH (ref 11.5–15.5)
WBC: 14 10*3/uL — ABNORMAL HIGH (ref 4.0–10.5)

## 2016-05-06 LAB — BASIC METABOLIC PANEL
Anion gap: 8 (ref 5–15)
BUN: 59 mg/dL — AB (ref 6–20)
CO2: 26 mmol/L (ref 22–32)
CREATININE: 1.11 mg/dL (ref 0.61–1.24)
Calcium: 8.4 mg/dL — ABNORMAL LOW (ref 8.9–10.3)
Chloride: 107 mmol/L (ref 101–111)
GFR calc Af Amer: >60 mL/min (ref >60)
GLUCOSE: 260 mg/dL — AB (ref 65–99)
Potassium: 3.1 mmol/L — ABNORMAL LOW (ref 3.5–5.1)
SODIUM: 141 mmol/L (ref 135–145)

## 2016-05-06 LAB — HEPARIN LEVEL (UNFRACTIONATED): Heparin Unfractionated: 0.38 IU/mL (ref 0.30–0.70)

## 2016-05-06 LAB — PHOSPHORUS: Phosphorus: 3.4 mg/dL (ref 2.5–4.6)

## 2016-05-06 MED ORDER — POTASSIUM CHLORIDE 20 MEQ/15ML (10%) PO SOLN
40.0000 meq | Freq: Three times a day (TID) | ORAL | Status: AC
  Administered 2016-05-06 (×2): 40 meq via ORAL
  Filled 2016-05-06 (×2): qty 30

## 2016-05-06 MED ORDER — METOPROLOL TARTRATE 5 MG/5ML IV SOLN
5.0000 mg | INTRAVENOUS | Status: DC | PRN
  Administered 2016-05-06: 5 mg via INTRAVENOUS
  Filled 2016-05-06: qty 5

## 2016-05-06 MED ORDER — INSULIN ASPART 100 UNIT/ML ~~LOC~~ SOLN
7.0000 [IU] | SUBCUTANEOUS | Status: DC
  Administered 2016-05-06 – 2016-05-07 (×7): 7 [IU] via SUBCUTANEOUS

## 2016-05-06 MED ORDER — MAGNESIUM SULFATE 2 GM/50ML IV SOLN
2.0000 g | Freq: Once | INTRAVENOUS | Status: AC
  Administered 2016-05-06: 2 g via INTRAVENOUS
  Filled 2016-05-06: qty 50

## 2016-05-06 MED ORDER — FREE WATER
250.0000 mL | Freq: Four times a day (QID) | Status: DC
  Administered 2016-05-06 – 2016-05-07 (×4): 250 mL

## 2016-05-06 NOTE — Progress Notes (Signed)
PULMONARY / CRITICAL CARE MEDICINE   Name: Clayton [name] MRN: [id number] DOB: 09/12/1955    ADMISSION DATE:  04/14/2016  REFERRING MD:  Alvino Chapel (EDP)   CHIEF COMPLAINT:  Sepsis, respiratory failure   HISTORY OF PRESENT ILLNESS:   60yo male SNF resident with hx CHF, CAD s/p remote stent, DM, HTN, Aflutter s/p ablation on coumadin, severe PVD s/p R BKA, being treated for unknown infection at SNF with rocephin (x 1 dose).  Presented 10/12 from SNF after being found unresponsive with agonal breaths.  Bagged by EMS en route and intubated on arrival to ER.  In ER was febrile to 104, hypotensive with SBP 70's.  Labs notable for AKI with K 7.1, glucose 500, WBC 36, lactate 3.6.  PCCM consulted for admit.   SUBJECTIVE:  No acute events overnight. Fever curve is improving since 10/25 with peak temp of 100.5 over the past 24 hours. HR is still fluctuates in 1-teens to 120s while on Amiodarone drip which was restarted 10/26 AM; he has required PRN Metoprolol as well. BP stable. Diuresed yesterday with Lasix IV 40x 3 with 2L out; He is net 5.5 L positive (from positive 4.4L yesterday).   REVIEW OF SYSTEMS:  Answers questions with nodding/shanking of head. No pain.   VITAL SIGNS: BP 112/76   Pulse 95   Temp 99.6 F (37.6 C) (Oral)   Resp (!) 22   Ht '5\' 10"'$  (1.778 m)   Wt 93.9 kg (207 lb 0.2 oz)   SpO2 100%   BMI 29.70 kg/m   HEMODYNAMICS:    VENTILATOR SETTINGS: Vent Mode: PRVC FiO2 (%):  [40 %] 40 % Set Rate:  [18 bmp-22 bmp] 18 bmp Vt Set:  [500 mL-580 mL] 500 mL PEEP:  [5 cmH20] 5 cmH20 Plateau Pressure:  [20 cmH20-23 cmH20] 21 cmH20  INTAKE / OUTPUT: I/O last 3 completed shifts: In: 6063.6 [I.V.:1393.6; Other:1270; NG/GT:2850; IV Piggyback:550] Out: 2730 [Urine:2405; Stool:325]  PHYSICAL EXAMINATION:  General:  NAD  Neuro:  More sedated today (received Fentanyl) HEENT:  ETT in place. No scleral icterus or injection. Cardiovascular: regular rhythm. No appreciable  JVD. Lungs: coarse breath sounds. Symmetric chest rise on ventilator.  Abdomen:  Normal bowel bowel sounds. PEG tube in place without surrounding erythema, Nontender, + BS Musculoskeletal:  Warm and dry. 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 Right foot is wrapped in soft support.   LABS:  BMET  Recent Labs Lab 05/04/16 0430 05/05/16 0458 05/05/16 1600  NA 152* 144 144  K 3.9 2.6* 3.4*  CL 113* 109 109  CO2 '27 27 26  '$ BUN 68* 67* 58*  CREATININE 1.39* 1.22 1.15  GLUCOSE 314* 191* 238*    Electrolytes  Recent Labs Lab 05/03/16 0420  05/04/16 0020 05/04/16 0430 05/05/16 0458 05/05/16 1600  CALCIUM 8.1*  < >  --  8.6* 8.2* 8.5*  MG 2.3  --  1.9 2.0 1.8  --   PHOS 3.0  --   --  3.7 3.5  --   < > = values in this interval not displayed.  CBC  Recent Labs Lab 05/04/16 0430 05/05/16 0458 05/06/16 0500  WBC 12.1* 16.8* 14.0*  HGB 8.6* 8.9* 8.3*  HCT 30.2* 30.1* 27.5*  PLT 267 336 280    Coag's No results for input(s): APTT, INR in the last 168 hours.  Sepsis Markers  Recent Labs Lab 04/29/16 1000 04/29/16 1120 05/01/16 0321 05/03/16 0420  LATICACIDVEN 1.8  --   --   --  PROCALCITON  --  0.71 0.34 0.24    ABG  Recent Labs Lab 05/04/16 0341 05/05/16 0405 05/06/16 0355  PHART 7.500* 7.515* 7.502*  PCO2ART 32.5 30.7* 32.1  PO2ART 179* 174* 165*    Liver Enzymes  Recent Labs Lab 04/29/16 1120  05/01/16 0321 05/02/16 0415 05/03/16 0420  AST 52*  --   --   --   --   ALT 54  --   --   --   --   ALKPHOS 142*  --   --   --   --   BILITOT 0.9  --   --   --   --   ALBUMIN 1.8*  < > 2.1* 2.3* 2.2*  < > = values in this interval not displayed.  Cardiac Enzymes No results for input(s): TROPONINI, PROBNP in the last 168 hours.  Glucose  Recent Labs Lab 05/05/16 0745 05/05/16 1147 05/05/16 1544 05/05/16 2015 05/05/16 2345 05/06/16 0356  GLUCAP 192* 199* 202* 224* 229* 240*    Imaging No results found.   STUDIES:   Renal U/S 10/12:  No abnormality or hydronephrosis.  TTE 10/13:  Difficult study. Mild LVH w/ EF 55%. Possible akinesis apical myocardium without obvious thrombus.Moderate aortic stenosis w/ mild mitral stenosis. RV poorly visualized. CT abdomen 10/15: no evidence for retroperitoneal hemorrhage. RLL central airspace disease compatible with aspiration and/or PNA CXR: 10/17: unchanged from 10/16. No consolidation. Final read pending.  RUQ [place] 10/18: Cholelithiasis without sonographic evidence acute cholecystitis. A hepatobiliary scintigraphy may provide better evaluation of the gallbladder if an acute cholecystitis is clinically suspected. Fatty Liver.  CXR 10/19: No pulmonary edema. Stable endotracheal tube position. Hazy right infrahilar and right base atelectasis or infiltrate CXR 10/22: no acute abnormality  X-ray R Foot 10/21: no osteomyelitis   HIV 10/22: non reactive  Hep C 10/22: negative Hep B 10/22: negative CT chest/abd/pelvis 10/25: Interval improvement in bibasilar airspace opacities. There are residual reticulonodular densities at both lung bases, worse on the right. This could be secondary to aspiration or atypical infection. Multiple gallstones with persistent gallbladder distention and possible mild pericholecystic inflammation. Appearance is similar to previous CT, and ultrasound done 6 days ago did not show findings highly suspicious for cholecystitis. If that is a clinical concern, consider hepatobiliary scan. Small amount of ascites without evidence of abscess.   MICROBIOLOGY: MRSA PCR 10/12:  Negative Blood Ctx x2 10/12 >> 1/2 Coag Neg Staph; 1/2 NG x 5 days  Urine Ctx 10/12:  Negative  Tracheal Asp Ctx 10/13 >> MRSA Respiratory Viral Panel PCR 10/13:  Negative  Blood Cx 10/21: PCR enterococcus, coag neg staph MR. Cx- coag neg staph in one bottle (likley contaminant); 2nd bottle No growth x 5 days Tracheal Aspirate cx 10/21: MRSA Urine Culture 10/21: No growth final    ANTIBIOTICS: Zosyn 10/12 x1 dose; 10/16 >>10/17 Vancomycin 10/12 >>10/18 Cefepime 10/12 >> 10/16 Vancomycin 10/22 >> Ampicillin 10/22 >>10/24  SIGNIFICANT EVENTS: 10/12 - Admit & intubation in ED 10/14 - switched from Levo to Neo due to intermittent tachycardia  10/15 - off pressor 10/17 - Failed wean 10/18 - failed wean started lasix; Completed Vanc for PNA  10/19 - Failed wean  10/20 - continues to have fevers despite completing abx therapy for PNA. ID consulted.  ESR and CRP elevated.  10/22 - possible bacteremia with Enterococcus/MRSA on PCA with pending cultures. Started on Vanc and Ampicillin; Lasix switched from TID to BID.  10/24 - discontinued Ampicillin per ID. Had Aflutter  started on amiodarone drip.    LINES/TUBES: OETT 7.5 10/12 >>  RUE PICC (SNF) 10/11 >>   Foley 10/12 >> PEG >> PIV x1  ASSESSMENT / PLAN:  Mr. Memmer is a  60 y.o. male with PMH CHF, CAD s/p remote stent, DM, HTN, Aflutter s/p ablation on anticoagulation, severe PVD s/p R BKA. Presenting with septic shock likely from MRSA PNA. Is on Heparin for history of a flutter.  Attempted SBT but did not pass; Possibly fluid overloaded therefore diuresing wtih Lasix. Continues to have fevers despite completing antibiotic therapy for MRSA PNA. He was found to have possible bacteremia with Enterococcus/coagulase neg MR staph on PCR in 1/2 bottles with pending cultures from 10/22 and started on Vancomycin and Ampicillin. However this grew coag negative staph in one bottle which is likely a contaminant and therefore Ampicillin was discontinued 10/24, and Vancomycin was continued for treatment of MRSA PNA. Seems like his fever curve is improving. HR is still fluctuating while on Amiodarone drip.   INFECTIOUS A: Severe Sepsis, resolved RLL MRSA PNA: tracheal aspirate culture with MRSA which was treated but repeat cx grew MRSA. Continued fevers, fever curve improving: unlikely bacteremia thus far as 1 bottle with coag  negative staph. CT chest/abd/pelvis negative for abscess  P:   S/p Vancomycin 10/12-10/18; Restarted Vanc 10/22 > (day #13 total) S/p Cefepime and Zosyn complete Ampicillin 10/22 >>>10/24 ID Consulted: Would treat for total of 14 days of Vancomycin for MRSA PNA Hepatitis Panel HCV negative HBV S ag negative  CT of chest, abdomen and pelvis negative for abscess  PULMONARY A: Acute Hypoxic Respiratory Failure - likely due to PNA Respiratory Alkalosis - improving  P:   Full Vent Support Failed to get family to decide on trach/peg, social work attempting to find additional phone numbers. Intermittent CXR & ABG. Albuterol nebs prn.  CARDIOVASCULAR A: Hypotension: Maps mainly in the 70s.  Shock - Likely septic. Resolved and off presors.  Elevated Troponin I - Likely demand ischemia. Echo was difficult to interpret. Questionable apical akinesis. Aortic Stenosis - Seen on poor quality TTE. H/O CAD w/ Stent H/O CHF - Dilated LV w/ decreased function November 2016. A Flutter/Afib - S/P ablation; HR fluctuating on amiodarone   P:  Amiodarone drip  Metoprolol IV PRN HR >130  Heparin drip. Continuous telemetry monitoring. Vitals per unit protocol. ASA '81mg'$  VT daily.  RENAL A: Acute Renal Failure - Creatinine improving.  Had renal US earlier in hospital stay which was unremarkable. Hypernatremia - resolved Hypophosphatemia - Resolved Hypokalemia - 3.1 this AM Hypomagnesemia - resolved Lactic Acidosis - Resolved. AGMA - Resolved.   P:   Monitoring UOP with Foley. Trending Electrolytes and Renal function. Free water 300cc q 4 hrs.   GASTROINTESTINAL A: Loose stools: c diff negative  RUQ tenderness : RUQ [place] with cholelithiasis without sonographic evidence of cholecystitis. Tenderness resolved.  RLQ Tenderness: Resolved; initially reported by patient  P:   NPO Tube feeds Protonix VT daily Imodium PRN for loose stools  HEMATOLOGIC A: Leukocytosis -   Down-trending Anemia - stable hgb. S/p 1 unit pRBC 10/16. No signs of active bleeding  Coagulopathy - resolved; on coumadin at home  P:  Trending cell counts daily w/ CBC. Transfuse for Hgb <7.0. Heparin Drip.  ENDOCRINE A: H/O DM - A1c 10.6 in June 2017. A1c 7.0 this admission. CBGs in 200s in the setting of tube feeds   P:   SSI resistant Scheduled Novolog 7 units q 4 hrs (only  with tube feeds)  Lantus 40 Units daily Accu-Checks q4hr  NEUROLOGIC A: Acute Encephalopathy - Multifactorial from hypoxia and probable toxic metabolic. Sedation on Ventilator  P:   RASS goal: 0 to -1. Precedex gtt d/c 10/25. Versed IV prn sedation. Klonipin 1 BID.  INTEGUMENT:  A: Sacral Pressure Injury  P: - Wound care: Santyl with saline moistened gauze daily covered with dry gauze   Smiley Houseman, MD PGY 2 Family Medicine   Rush Farmer, M.D. [facility] Pulmonary/Critical Care Medicine. Pager: [phone]. After hours pager: [phone].

## 2016-05-06 NOTE — Progress Notes (Signed)
Subjective:  Afebrile x 24hr. Tmax yesterday am of 100.5, fever curve overall improved.   24hr event - getting diuresis to help with ventilation  Antibiotics:  Anti-infectives    Start     Dose/Rate Route Frequency Ordered Stop   05/01/16 1000  ampicillin (OMNIPEN) 2 g in sodium chloride 0.9 % 50 mL IVPB  Status:  Discontinued     2 g 150 mL/hr over 20 Minutes Intravenous Every 6 hours 05/01/16 0946 05/03/16 2250   05/01/16 0900  ceFAZolin (ANCEF) IVPB 2g/100 mL premix  Status:  Discontinued     2 g 200 mL/hr over 30 Minutes Intravenous Every 8 hours 05/01/16 0835 05/01/16 0844   05/01/16 0900  vancomycin (VANCOCIN) 500 mg in sodium chloride 0.9 % 100 mL IVPB     500 mg 100 mL/hr over 60 Minutes Intravenous Every 12 hours 05/01/16 0835     05/01/16 0900  ceFAZolin (ANCEF) IVPB 2g/100 mL premix  Status:  Discontinued     2 g 200 mL/hr over 30 Minutes Intravenous Every 8 hours 05/01/16 0844 05/01/16 0946   04/26/16 1600  vancomycin (VANCOCIN) 500 mg in sodium chloride 0.9 % 100 mL IVPB     500 mg 100 mL/hr over 60 Minutes Intravenous Every 12 hours 04/26/16 1112 04/27/16 1626   04/26/16 1100  doxycycline (VIBRAMYCIN) 100 mg in dextrose 5 % 250 mL IVPB  Status:  Discontinued     100 mg 125 mL/hr over 120 Minutes Intravenous Every 12 hours 04/26/16 1053 04/26/16 1100   04/25/16 1600  vancomycin (VANCOCIN) 500 mg in sodium chloride 0.9 % 100 mL IVPB  Status:  Discontinued     500 mg 100 mL/hr over 60 Minutes Intravenous Every 12 hours 04/25/16 1558 04/26/16 1053   04/25/16 1230  piperacillin-tazobactam (ZOSYN) IVPB 3.375 g  Status:  Discontinued     3.375 g 12.5 mL/hr over 240 Minutes Intravenous Every 8 hours 04/25/16 1222 04/26/16 1053   04/23/16 0000  ceFEPIme (MAXIPIME) 1 g in dextrose 5 % 50 mL IVPB  Status:  Discontinued     1 g 100 mL/hr over 30 Minutes Intravenous Every 12 hours 04/22/16 1439 04/25/16 1117   04/22/16 1500  vancomycin (VANCOCIN) IVPB 750 mg/150 ml  premix  Status:  Discontinued     750 mg 150 mL/hr over 60 Minutes Intravenous Every 12 hours 04/22/16 1439 04/25/16 1550   04/17/2016 1400  piperacillin-tazobactam (ZOSYN) IVPB 2.25 g  Status:  Discontinued     2.25 g 100 mL/hr over 30 Minutes Intravenous Every 6 hours 04/19/2016 0831 04/28/2016 0914   05/02/2016 1200  ceFEPIme (MAXIPIME) 1 g in dextrose 5 % 50 mL IVPB  Status:  Discontinued     1 g 100 mL/hr over 30 Minutes Intravenous Every 24 hours 04/13/2016 0914 04/22/16 1439   04/10/2016 0730  piperacillin-tazobactam (ZOSYN) IVPB 3.375 g     3.375 g 100 mL/hr over 30 Minutes Intravenous  Once 04/13/2016 0723 04/10/2016 0815   04/29/2016 0730  vancomycin (VANCOCIN) IVPB 1000 mg/200 mL premix  Status:  Discontinued     1,000 mg 200 mL/hr over 60 Minutes Intravenous  Once 05/06/2016 0723 04/13/2016 0726   05/04/2016 0730  vancomycin (VANCOCIN) 2,000 mg in sodium chloride 0.9 % 500 mL IVPB     2,000 mg 250 mL/hr over 120 Minutes Intravenous  Once 04/28/2016 0726 04/20/2016 1033      Medications: Scheduled Meds: . aspirin  81 mg Oral Daily  .  chlorhexidine gluconate (MEDLINE KIT)  15 mL Mouth Rinse BID  . clonazePAM  1 mg Per Tube BID  . collagenase   Topical Daily  . feeding supplement (PRO-STAT SUGAR FREE 64)  30 mL Per Tube TID  . free water  250 mL Per Tube Q6H  . insulin aspart  0-20 Units Subcutaneous Q4H  . insulin aspart  7 Units Subcutaneous Q4H  . insulin glargine  40 Units Subcutaneous Q24H  . mouth rinse  15 mL Mouth Rinse QID  . pantoprazole sodium  40 mg Per Tube Q1200  . potassium chloride  40 mEq Oral Q8H  . vancomycin  500 mg Intravenous Q12H   Continuous Infusions: . sodium chloride 10 mL/hr at 05/06/16 0600  . amiodarone 30 mg/hr (05/06/16 0600)  . dextrose    . feeding supplement (VITAL HIGH PROTEIN) 1,000 mL (05/06/16 0600)  . heparin 2,000 Units/hr (05/06/16 0801)    Objective: Weight change:   Intake/Output Summary (Last 24 hours) at 05/06/16 1249 Last data filed at  05/06/16 1200  Gross per 24 hour  Intake           2730.8 ml  Output             2220 ml  Net            510.8 ml   Blood pressure 113/63, pulse 87, temperature 99.5 F (37.5 C), temperature source Oral, resp. rate (!) 31, height _0  (1.778 m), weight 207 lb 0.2 oz (93.9 kg), SpO2 100 %. 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 laterally Neuro: nonfocal, strength and sensation intact  CBC:  CBC Latest Ref Rng & Units 05/06/2016 05/05/2016 05/04/2016  WBC 4.0 - 10.5 K/uL 14.0(H) 16.8(H) 12.1(H)  Hemoglobin 13.0 - 17.0 g/dL 8.3(L) 8.9(L) 8.6(L)  Hematocrit 39.0 - 52.0 % 27.5(L) 30.1(L) 30.2(L)  Platelets 150 - 400 K/uL 280 336 267      BMET  Recent Labs  05/05/16 1600 05/06/16 0500  NA 144 141  K 3.4* 3.1*  CL 109 107  CO2 26 26  GLUCOSE 238* 260*  BUN 58* 59*  CREATININE 1.15 1.11  CALCIUM 8.5* 8.4*     Micro Results: Recent Results (from the past 720 hour(s))  Blood Culture (routine x 2)     Status: None   Collection Time: 05/04/2016  7:30 AM  Result Value Ref Range Status   Specimen Description BLOOD LEFT ANTECUBITAL  Final   Special Requests BOTTLES DRAWN AEROBIC AND ANAEROBIC 5CC  Final   Culture NO GROWTH 5 DAYS  Final   Report Status 04/26/2016 FINAL  Final  Blood Culture (routine x 2)     Status: Abnormal   Collection Time: 05/10/2016  7:35 AM  Result Value Ref Range Status   Specimen Description BLOOD RIGHT  HAND  Final   Special Requests BOTTLES DRAWN AEROBIC ONLY 5CC  Final   Culture  Setup Time   Final    GRAM  POSITIVE COCCI IN CLUSTERS AEROBIC BOTTLE ONLY Organism ID to follow CRITICAL RESULT CALLED TO, READ BACK BY AND VERIFIED WITH: J. CARNEY, PHARM AT 0740 ON [id number] BY Rhea Bleacher    Culture (A)  Final    STAPHYLOCOCCUS SPECIES (COAGULASE NEGATIVE) THE SIGNIFICANCE OF ISOLATING THIS ORGANISM FROM A SINGLE SET OF BLOOD CULTURES WHEN MULTIPLE SETS ARE DRAWN IS UNCERTAIN. PLEASE NOTIFY THE MICROBIOLOGY DEPARTMENT WITHIN ONE WEEK IF SPECIATION AND SENSITIVITIES ARE REQUIRED.    Report Status 04/24/2016 FINAL  Final  Blood Culture ID Panel (Reflexed)     Status: Abnormal   Collection Time: 04/18/2016  7:35 AM  Result Value Ref Range Status   Enterococcus species NOT DETECTED NOT DETECTED Final   Listeria monocytogenes NOT DETECTED NOT DETECTED Final   Staphylococcus species DETECTED (A) NOT DETECTED Final    Comment: CRITICAL RESULT CALLED TO, READ BACK BY AND VERIFIED WITH: J. CARNEY, [place] ON [id number] BY S. YARBROUGH    Staphylococcus aureus NOT DETECTED NOT DETECTED Final   Methicillin resistance DETECTED (A) NOT DETECTED Final    Comment: CRITICAL RESULT CALLED TO, READ BACK BY AND VERIFIED WITH: J. CARNEY, [place] [id number] BY S. YARBROUGH    Streptococcus species NOT DETECTED NOT DETECTED Final   Streptococcus agalactiae NOT DETECTED NOT DETECTED Final   Streptococcus pneumoniae NOT DETECTED NOT DETECTED Final   Streptococcus pyogenes NOT DETECTED NOT DETECTED Final   Acinetobacter baumannii NOT DETECTED NOT DETECTED Final   Enterobacteriaceae species NOT DETECTED NOT DETECTED Final   Enterobacter cloacae complex NOT DETECTED NOT DETECTED Final   Escherichia coli NOT DETECTED NOT DETECTED Final   Klebsiella oxytoca NOT DETECTED NOT DETECTED Final   Klebsiella pneumoniae NOT DETECTED NOT DETECTED Final   Proteus species NOT DETECTED NOT DETECTED Final   Serratia  marcescens NOT DETECTED NOT DETECTED Final   Haemophilus influenzae NOT DETECTED NOT DETECTED Final   Neisseria meningitidis NOT DETECTED NOT DETECTED Final   Pseudomonas aeruginosa NOT DETECTED NOT DETECTED Final   Candida albicans NOT DETECTED NOT DETECTED Final   Candida glabrata NOT DETECTED NOT DETECTED Final   Candida krusei NOT DETECTED NOT DETECTED Final   Candida parapsilosis NOT DETECTED NOT DETECTED Final   Candida tropicalis NOT DETECTED NOT DETECTED Final  Urine culture     Status: None   Collection Time: 04/29/2016  8:41 AM  Result Value Ref Range Status   Specimen Description URINE, CATHETERIZED  Final   Special Requests NONE  Final   Culture NO GROWTH  Final   Report Status 04/22/2016 FINAL  Final  MRSA PCR Screening     Status: None   Collection Time: 04/20/2016 12:20 PM  Result Value Ref Range Status   MRSA by PCR NEGATIVE NEGATIVE Final    Comment:        The GeneXpert MRSA Assay (FDA approved for NASAL specimens only), is one component of a comprehensive MRSA colonization surveillance program. It is not intended to diagnose MRSA infection nor to guide or monitor treatment for MRSA infections.   Culture, respiratory (NON-Expectorated)     Status: None   Collection Time: 04/22/16 12:18 PM  Result Value Ref Range Status   Specimen Description TRACHEAL ASPIRATE  Final   Special Requests Normal  Final   Gram Stain   Final    MODERATE WBC PRESENT, PREDOMINANTLY PMN FEW GRAM POSITIVE COCCI IN PAIRS IN SINGLES    Culture   Final    MODERATE METHICILLIN RESISTANT STAPHYLOCOCCUS AUREUS  Report Status 04/25/2016 FINAL  Final   Organism ID, Bacteria METHICILLIN RESISTANT STAPHYLOCOCCUS AUREUS  Final      Susceptibility   Methicillin resistant staphylococcus aureus - MIC*    CIPROFLOXACIN >=8 RESISTANT Resistant     ERYTHROMYCIN >=8 RESISTANT Resistant     GENTAMICIN <=0.5 SENSITIVE Sensitive     OXACILLIN >=4 RESISTANT Resistant     TETRACYCLINE <=1 SENSITIVE  Sensitive     VANCOMYCIN 1 SENSITIVE Sensitive     TRIMETH/SULFA <=10 SENSITIVE Sensitive     CLINDAMYCIN >=8 RESISTANT Resistant     RIFAMPIN <=0.5 SENSITIVE Sensitive     Inducible Clindamycin NEGATIVE Sensitive     * MODERATE METHICILLIN RESISTANT STAPHYLOCOCCUS AUREUS  Respiratory Panel by PCR     Status: None   Collection Time: 04/22/16  1:11 PM  Result Value Ref Range Status   Adenovirus NOT DETECTED NOT DETECTED Final   Coronavirus 229E NOT DETECTED NOT DETECTED Final   Coronavirus HKU1 NOT DETECTED NOT DETECTED Final   Coronavirus NL63 NOT DETECTED NOT DETECTED Final   Coronavirus OC43 NOT DETECTED NOT DETECTED Final   Metapneumovirus NOT DETECTED NOT DETECTED Final   Rhinovirus / Enterovirus NOT DETECTED NOT DETECTED Final   Influenza A NOT DETECTED NOT DETECTED Final   Influenza B NOT DETECTED NOT DETECTED Final   Parainfluenza Virus 1 NOT DETECTED NOT DETECTED Final   Parainfluenza Virus 2 NOT DETECTED NOT DETECTED Final   Parainfluenza Virus 3 NOT DETECTED NOT DETECTED Final   Parainfluenza Virus 4 NOT DETECTED NOT DETECTED Final   Respiratory Syncytial Virus NOT DETECTED NOT DETECTED Final   Bordetella pertussis NOT DETECTED NOT DETECTED Final   Chlamydophila pneumoniae NOT DETECTED NOT DETECTED Final   Mycoplasma pneumoniae NOT DETECTED NOT DETECTED Final  C difficile quick scan w PCR reflex     Status: None   Collection Time: 04/25/16 10:56 PM  Result Value Ref Range Status   C Diff antigen NEGATIVE NEGATIVE Final   C Diff toxin NEGATIVE NEGATIVE Final   C Diff interpretation No C. difficile detected.  Final  Culture, Urine     Status: None   Collection Time: 04/30/16  9:08 AM  Result Value Ref Range Status   Specimen Description URINE, CATHETERIZED  Final   Special Requests NONE  Final   Culture NO GROWTH  Final   Report Status 05/01/2016 FINAL  Final  Culture, respiratory (NON-Expectorated)     Status: None   Collection Time: 04/30/16  9:10 AM  Result Value  Ref Range Status   Specimen Description TRACHEAL ASPIRATE  Final   Special Requests NONE  Final   Gram Stain   Final    ABUNDANT WBC PRESENT,BOTH PMN AND MONONUCLEAR ABUNDANT GRAM POSITIVE COCCI IN CLUSTERS    Culture   Final    ABUNDANT METHICILLIN RESISTANT STAPHYLOCOCCUS AUREUS   Report Status 05/02/2016 FINAL  Final   Organism ID, Bacteria METHICILLIN RESISTANT STAPHYLOCOCCUS AUREUS  Final      Susceptibility   Methicillin resistant staphylococcus aureus - MIC*    CIPROFLOXACIN >=8 RESISTANT Resistant     ERYTHROMYCIN >=8 RESISTANT Resistant     GENTAMICIN <=0.5 SENSITIVE Sensitive     OXACILLIN >=4 RESISTANT Resistant     TETRACYCLINE <=1 SENSITIVE Sensitive     VANCOMYCIN 1 SENSITIVE Sensitive     TRIMETH/SULFA <=10 SENSITIVE Sensitive     CLINDAMYCIN >=8 RESISTANT Resistant     RIFAMPIN <=0.5 SENSITIVE Sensitive     Inducible Clindamycin NEGATIVE Sensitive     *  ABUNDANT METHICILLIN RESISTANT STAPHYLOCOCCUS AUREUS  Culture, blood (Routine X 2) w Reflex to ID Panel     Status: Abnormal   Collection Time: 04/30/16 12:38 PM  Result Value Ref Range Status   Specimen Description BLOOD RIGHT HAND  Final   Special Requests IN PEDIATRIC BOTTLE 4CC  Final   Culture  Setup Time   Final    GRAM POSITIVE COCCI IN CLUSTERS IN PEDIATRIC BOTTLE CRITICAL RESULT CALLED TO, READ BACK BY AND VERIFIED WITH: K AMEND,PHARMD AT 0720 05/01/16 BY L BENFIELD CRITICAL RESULT CALLED TO, READ BACK BY AND VERIFIED WITH: C BALL,PHARMD AT 0935 05/01/16 BY L BENFIELD BCID REQUESTED REPEAT BY DR VAN DAMM    Culture (A)  Final    STAPHYLOCOCCUS SPECIES (COAGULASE NEGATIVE) THE SIGNIFICANCE OF ISOLATING THIS ORGANISM FROM A SINGLE VENIPUNCTURE CANNOT BE PREDICTED WITHOUT FURTHER CLINICAL AND CULTURE CORRELATION. SUSCEPTIBILITIES AVAILABLE ONLY ON REQUEST. NO ENTEROCOCCUS WAS RECOVERED IN CULTURE    Report Status 05/04/2016 FINAL  Final  Blood Culture ID Panel (Reflexed)     Status: Abnormal    Collection Time: 04/30/16 12:38 PM  Result Value Ref Range Status   Enterococcus species DETECTED (A) NOT DETECTED Final    Comment: CRITICAL RESULT CALLED TO, READ BACK BY AND VERIFIED WITH: C BALL,PHARMD AT 0935 05/01/16 BY L BENFIELD    Vancomycin resistance NOT DETECTED NOT DETECTED Final   Listeria monocytogenes NOT DETECTED NOT DETECTED Final   Staphylococcus species DETECTED (A) NOT DETECTED Final    Comment: CRITICAL RESULT CALLED TO, READ BACK BY AND VERIFIED WITH: C BALL,PHARMD AT 0935 05/01/16 BY L BENFIELD    Staphylococcus aureus NOT DETECTED NOT DETECTED Final   Methicillin resistance DETECTED (A) NOT DETECTED Final    Comment: CRITICAL RESULT CALLED TO, READ BACK BY AND VERIFIED WITH: C BALL,PHARMD AT 0935 05/01/16 BY L BENFIELD    Streptococcus species NOT DETECTED NOT DETECTED Final   Streptococcus agalactiae NOT DETECTED NOT DETECTED Final   Streptococcus pneumoniae NOT DETECTED NOT DETECTED Final   Streptococcus pyogenes NOT DETECTED NOT DETECTED Final   Acinetobacter baumannii NOT DETECTED NOT DETECTED Final   Enterobacteriaceae species NOT DETECTED NOT DETECTED Final   Enterobacter cloacae complex NOT DETECTED NOT DETECTED Final   Escherichia coli NOT DETECTED NOT DETECTED Final   Klebsiella oxytoca NOT DETECTED NOT DETECTED Final   Klebsiella pneumoniae NOT DETECTED NOT DETECTED Final   Proteus species NOT DETECTED NOT DETECTED Final   Serratia marcescens NOT DETECTED NOT DETECTED Final   Haemophilus influenzae NOT DETECTED NOT DETECTED Final   Neisseria meningitidis NOT DETECTED NOT DETECTED Final   Pseudomonas aeruginosa NOT DETECTED NOT DETECTED Final   Candida albicans NOT DETECTED NOT DETECTED Final   Candida glabrata NOT DETECTED NOT DETECTED Final   Candida krusei NOT DETECTED NOT DETECTED Final   Candida parapsilosis NOT DETECTED NOT DETECTED Final   Candida tropicalis NOT DETECTED NOT DETECTED Final  Culture, blood (Routine X 2) w Reflex to ID Panel      Status: None   Collection Time: 04/30/16 12:56 PM  Result Value Ref Range Status   Specimen Description BLOOD RIGHT ARM  Final   Special Requests BOTTLES DRAWN AEROBIC AND ANAEROBIC 10CC  Final   Culture NO GROWTH 5 DAYS  Final   Report Status 05/05/2016 FINAL  Final    Studies/Results: Ct Abdomen Pelvis Wo Contrast  Result Date: 05/04/2016 CLINICAL DATA:  Found unresponsive at nursing home 04/17/2016 with agonal respirations, respiratory failure and multi-system failure. Sepsis.  EXAM: CT CHEST, ABDOMEN AND PELVIS WITHOUT CONTRAST TECHNIQUE: Multidetector CT imaging of the chest, abdomen and pelvis was performed following the standard protocol without IV contrast. COMPARISON:  Abdominal ultrasound 04/28/2016. Abdominal pelvic CT 04/24/2016. 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 Right arm PICC extends near the confluence of the brachiocephalic veins. No acute vascular findings are demonstrated on noncontrast imaging. There are mitral annular and probable aortic valvular calcifications. The heart size is normal. There is no pericardial effusion. Mediastinum/Nodes: There are no enlarged mediastinal, hilar or axillary lymph nodes. Endotracheal tube tip is in the midtrachea. The thyroid gland and esophagus appear unremarkable. Lungs/Pleura: There is no residual pleural effusion. There are improving airspace opacities at both lung bases. There are residual reticulonodular densities in the lower lobes, greater on the right. No consolidation or suspicious pulmonary nodule. Musculoskeletal/Chest wall: There are numerous nondisplaced subacute appearing rib fractures bilaterally. 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 CT ABDOMEN AND PELVIS FINDINGS Hepatobiliary: The liver appears unremarkable as imaged in the noncontrast state. Multiple gallstones are again noted. The gallbladder  remains distended with mild wall thickening and surrounding inflammation. No significant biliary dilatation. Pancreas: Unremarkable. No pancreatic ductal dilatation or surrounding inflammatory changes. Spleen: Normal in size without focal abnormality. Adrenals/Urinary Tract: Both adrenal glands appear normal. Both kidneys appear normal without evidence of mass, urinary tract calculus or hydronephrosis. There are renovascular calcifications bilaterally. Foley catheter has been removed. There is mild bladder wall thickening. A small amount of air is present within the bladder lumen. Stomach/Bowel: No evidence of bowel wall thickening, distention or surrounding inflammatory change. Appendix not clearly seen. Percutaneous gastrostomy remains in place. The rectal tube has been removed. Vascular/Lymphatic: There are no enlarged abdominal or pelvic lymph nodes. Extensive aortic and branch vessel atherosclerosis again noted. Reproductive: The prostate gland and seminal vesicles appear unremarkable. Other: There is a small amount of ascites, primarily in both pericolic gutters. No focal extraluminal fluid collection identified. Subcutaneous and mesenteric edema has improved. Musculoskeletal: No acute or significant osseous findings. IMPRESSION: 1. Interval improvement in bibasilar airspace opacities. There are residual reticulonodular densities at both lung bases, worse on the right. This could be secondary to aspiration or atypical infection. 2. Multiple gallstones with persistent gallbladder distention and possible mild pericholecystic inflammation. Appearance is similar to previous CT, and ultrasound done 6 days ago did not show findings highly suspicious for cholecystitis. If that is a clinical concern, consider hepatobiliary scan. 3. Diffuse atherosclerosis. 4. Small amount of ascites without evidence of abscess. 5. Nondisplaced bilateral rib fractures, probably from previous CPR. Electronically Signed   By: Richardean Sale M.D.   On: 05/04/2016 15:28   Ct Chest Wo Contrast  Result Date: 05/04/2016 CLINICAL DATA:  Found unresponsive at nursing home 04/19/2016 with agonal respirations, respiratory failure and multi-system failure. Sepsis. EXAM: CT CHEST, ABDOMEN AND PELVIS WITHOUT CONTRAST TECHNIQUE: Multidetector CT imaging of the chest, abdomen and pelvis was performed following the standard protocol without IV contrast. COMPARISON:  Abdominal ultrasound 04/28/2016. Abdominal pelvic CT 04/24/2016. 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 Right arm PICC extends near the confluence of the brachiocephalic veins. No acute vascular findings are demonstrated on noncontrast imaging. There are mitral annular and probable aortic valvular calcifications. The heart size is normal. There is no pericardial effusion. Mediastinum/Nodes: There are no enlarged mediastinal, hilar or axillary lymph nodes. Endotracheal  tube tip is in the midtrachea. The thyroid gland and esophagus appear unremarkable. Lungs/Pleura: There is no residual pleural effusion. There are improving airspace opacities at both lung bases. There are residual reticulonodular densities in the lower lobes, greater on the right. No consolidation or suspicious pulmonary nodule. Musculoskeletal/Chest wall: There are numerous nondisplaced subacute appearing rib fractures bilaterally. 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 CT ABDOMEN AND PELVIS FINDINGS Hepatobiliary: The liver appears unremarkable as imaged in the noncontrast state. Multiple gallstones are again noted. The gallbladder remains distended with mild wall thickening and surrounding inflammation. No significant biliary dilatation. Pancreas: Unremarkable. No pancreatic ductal dilatation or surrounding inflammatory changes. Spleen: Normal in size without focal abnormality. Adrenals/Urinary Tract:  Both adrenal glands appear normal. Both kidneys appear normal without evidence of mass, urinary tract calculus or hydronephrosis. There are renovascular calcifications bilaterally. Foley catheter has been removed. There is mild bladder wall thickening. A small amount of air is present within the bladder lumen. Stomach/Bowel: No evidence of bowel wall thickening, distention or surrounding inflammatory change. Appendix not clearly seen. Percutaneous gastrostomy remains in place. The rectal tube has been removed. Vascular/Lymphatic: There are no enlarged abdominal or pelvic lymph nodes. Extensive aortic and branch vessel atherosclerosis again noted. Reproductive: The prostate gland and seminal vesicles appear unremarkable. Other: There is a small amount of ascites, primarily in both pericolic gutters. No focal extraluminal fluid collection identified. Subcutaneous and mesenteric edema has improved. Musculoskeletal: No acute or significant osseous findings. IMPRESSION: 1. Interval improvement in bibasilar airspace opacities. There are residual reticulonodular densities at both lung bases, worse on the right. This could be secondary to aspiration or atypical infection. 2. Multiple gallstones with persistent gallbladder distention and possible mild pericholecystic inflammation. Appearance is similar to previous CT, and ultrasound done 6 days ago did not show findings highly suspicious for cholecystitis. If that is a clinical concern, consider hepatobiliary scan. 3. Diffuse atherosclerosis. 4. Small amount of ascites without evidence of abscess. 5. Nondisplaced bilateral rib fractures, probably from previous CPR. Electronically Signed   By: Richardean Sale M.D.   On: 05/04/2016 15:28   Dg Chest Port 1 View  Result Date: 05/06/2016 CLINICAL DATA:  Endotracheal tube. EXAM: PORTABLE CHEST 1 VIEW COMPARISON:  05/05/2016 FINDINGS: Sternotomy wires are unchanged. Endotracheal tube has tip 3.3 cm above the carina.  Right-sided PICC line unchanged with tip over the region of the junction of the right subclavian vein to SVC. Lungs are hypoinflated and otherwise clear. Cardiomediastinal silhouette is within normal. Evidence of nondisplaced fractures of the anterior lateral right second and fourth ribs unchanged. The IMPRESSION: No acute cardiopulmonary disease. Tubes and lines as described. Nondisplaced right anterior lateral second and fourth rib fractures. Electronically Signed   By: Marin Olp M.D.   On: 05/06/2016 07:15   Dg Chest Port 1 View  Result Date: 05/05/2016 CLINICAL DATA:  Intubation. EXAM: PORTABLE CHEST 1 VIEW COMPARISON:  CT 05/04/2016.  Chest x-ray 05/04/2016, 05/03/2016. FINDINGS: Endotracheal tube and right PICC line in stable position. Prior CABG. Heart size normal. Near complete clearing of basilar interstitial infiltrates. No pleural effusion or pneumothorax. IMPRESSION: 1.  Lines and tubes in stable position. 2. Prior CABG.  Heart size stable. 3.  Near complete clearing of basilar interstitial infiltrates. Electronically Signed   By: Marcello Moores  Register   On: 05/05/2016 07:22   Micro:  10/21 blood cx: 1 of 2 sets showing GPC, but BCID suggesting MRSE and enterococcus 10/21trach aspirate: showing MRSA  Assessment/Plan:   Active  Problems:   Sepsis ([place])   Acute respiratory failure with hypoxia ([facility])   Endotracheally intubated   History of ETT   Hyperkalemia   RUQ pain   Transaminitis   FUO (fever of unknown origin)   Diabetic foot ulcer ([place])   Coag negative Staphylococcus bacteremia   Bacteremia due to Enterococcus    Clayton [name] is a 60 y.o. male with  esident with hx CHF, CAD s/p remote stent, DM, HTN, Aflutter s/p ablation WITH PACEMAKER  on coumadin, severe PVD s/p R BKA, being treated for possible infection at SNF with rocephin. Presented 10/12 from SNF after being found unresponsive with agonal breaths sp broad-spectrum antibiotics of vancomycin and cefepime narrowed  to vancomycin after MRSA isolated from his resp cultures still on ventilator and still some low-grade temperatures. He is now growing an Enterococcus and MR Coag neg staph from 1/2 cultures on 10/21  #1 FUO: still difficult to identify source be it pneumonia vs. DFU. Fever curved improved, currently afebrile. On day #12 of vanco to treat MRSA pneumonia. Continue with finishing last day to finish abtx  #2 Bacteremia: MRSE/enterococcus blood cx from 10/21 likely contaminant. No need to treat  #3: MRSA pneumonia = would treat for a total of 14 days with vancomcyin, which end date is tomorrow  #4 DFU:if he becomes febrile,  Recommend imaging right foot as possible source  Will sign off, call if questions  LOS: 15 days   Zackrey Dyar 05/06/2016, 12:49 PM

## 2016-05-06 NOTE — Progress Notes (Signed)
PULMONARY / CRITICAL CARE MEDICINE   Name: Clayton [name] MRN: [id number] DOB: [birth year]    ADMISSION DATE:  05/02/2016  REFERRING MD:  Alvino Chapel (EDP)   CHIEF COMPLAINT:  Sepsis, respiratory failure   HISTORY OF PRESENT ILLNESS:   60yo male SNF resident with hx CHF, CAD s/p remote stent, DM, HTN, Aflutter s/p ablation on coumadin, severe PVD s/p R BKA, being treated for unknown infection at SNF with rocephin (x 1 dose).  Presented 10/12 from SNF after being found unresponsive with agonal breaths.  Bagged by EMS en route and intubated on arrival to ER.  In ER was febrile to 104, hypotensive with SBP 70's.  Labs notable for Clayton with K 7.1, glucose 500, WBC 36, lactate 3.6.  PCCM consulted for admit.   SUBJECTIVE:  No acute events overnight. Fever curve is improving since 10/25 with peak temp of 100.5 over the past 24 hours. HR is still fluctuates in 1-teens to 120s while on Amiodarone drip which was restarted 10/26 AM; he has required PRN Metoprolol as well. BP stable. Diuresed yesterday with Lasix IV 40x 3 with 2L out; He is net 5.5 L positive (from positive 4.4L yesterday).   REVIEW OF SYSTEMS:  Answers questions with nodding/shanking of head. No pain.   VITAL SIGNS: BP (!) 105/59 (BP Location: Left Arm)   Pulse 74   Temp 99.2 F (37.3 C) (Oral)   Resp 19   Ht '5\' 10"'$  (1.778 m)   Wt 93.9 kg (207 lb 0.2 oz)   SpO2 100%   BMI 29.70 kg/m   HEMODYNAMICS:    VENTILATOR SETTINGS: Vent Mode: PRVC FiO2 (%):  [40 %] 40 % Set Rate:  [18 bmp-22 bmp] 18 bmp Vt Set:  [500 mL-580 mL] 500 mL PEEP:  [5 cmH20] 5 cmH20 Plateau Pressure:  [18 cmH20-23 cmH20] 18 cmH20  INTAKE / OUTPUT: I/O last 3 completed shifts: In: 4543.9 [I.V.:1563.9; Other:30; NG/GT:2250; IV Piggyback:700] Out: 3195 [Urine:2770; Stool:425]  PHYSICAL EXAMINATION:  General:  NAD  Neuro:  More sedated today (received Fentanyl) HEENT:  ETT in place. No scleral icterus or injection. Cardiovascular: regular rhythm. No  appreciable JVD. Lungs: coarse breath sounds. Symmetric chest rise on ventilator.  Abdomen:  Normal bowel bowel sounds. PEG tube in place without surrounding erythema, Nontender, + BS Musculoskeletal:  Warm and dry. 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 Right foot is wrapped in soft support.   LABS:  BMET  Recent Labs Lab 05/05/16 0458 05/05/16 1600 05/06/16 0500  NA 144 144 141  K 2.6* 3.4* 3.1*  CL 109 109 107  CO2 '27 26 26  '$ BUN 67* 58* 59*  CREATININE 1.22 1.15 1.11  GLUCOSE 191* 238* 260*    Electrolytes  Recent Labs Lab 05/04/16 0430 05/05/16 0458 05/05/16 1600 05/06/16 0500  CALCIUM 8.6* 8.2* 8.5* 8.4*  MG 2.0 1.8  --  1.9  PHOS 3.7 3.5  --  3.4    CBC  Recent Labs Lab 05/04/16 0430 05/05/16 0458 05/06/16 0500  WBC 12.1* 16.8* 14.0*  HGB 8.6* 8.9* 8.3*  HCT 30.2* 30.1* 27.5*  PLT 267 336 280    Coag's No results for input(s): APTT, INR in the last 168 hours.  Sepsis Markers  Recent Labs Lab 04/29/16 1000 04/29/16 1120 05/01/16 0321 05/03/16 0420  LATICACIDVEN 1.8  --   --   --   PROCALCITON  --  0.71 0.34 0.24    ABG  Recent Labs Lab 05/04/16 0341 05/05/16 0405 05/06/16 0355  PHART 7.500* 7.515* 7.502*  PCO2ART 32.5 30.7* 32.1  PO2ART 179* 174* 165*    Liver Enzymes  Recent Labs Lab 04/29/16 1120  05/01/16 0321 05/02/16 0415 05/03/16 0420  AST 52*  --   --   --   --   ALT 54  --   --   --   --   ALKPHOS 142*  --   --   --   --   BILITOT 0.9  --   --   --   --   ALBUMIN 1.8*  < > 2.1* 2.3* 2.2*  < > = values in this interval not displayed.  Cardiac Enzymes No results for input(s): TROPONINI, PROBNP in the last 168 hours.  Glucose  Recent Labs Lab 05/05/16 1147 05/05/16 1544 05/05/16 2015 05/05/16 2345 05/06/16 0356 05/06/16 0725  GLUCAP 199* 202* 224* 229* 240* 248*    Imaging Dg Chest Port 1 View  Result Date: 05/06/2016 CLINICAL DATA:  Endotracheal tube. EXAM: PORTABLE CHEST 1 VIEW  COMPARISON:  05/05/2016 FINDINGS: Sternotomy wires are unchanged. Endotracheal tube has tip 3.3 cm above the carina. Right-sided PICC line unchanged with tip over the region of the junction of the right subclavian vein to SVC. Lungs are hypoinflated and otherwise clear. Cardiomediastinal silhouette is within normal. Evidence of nondisplaced fractures of the anterior lateral right second and fourth ribs unchanged. The IMPRESSION: No acute cardiopulmonary disease. Tubes and lines as described. Nondisplaced right anterior lateral second and fourth rib fractures. Electronically Signed   By: Marin Olp M.D.   On: 05/06/2016 07:15     STUDIES:  Renal U/S 10/12:  No abnormality or hydronephrosis.  TTE 10/13:  Difficult study. Mild LVH w/ EF 55%. Possible akinesis apical myocardium without obvious thrombus.Moderate aortic stenosis w/ mild mitral stenosis. RV poorly visualized. CT abdomen 10/15: no evidence for retroperitoneal hemorrhage. RLL central airspace disease compatible with aspiration and/or PNA CXR: 10/17: unchanged from 10/16. No consolidation. Final read pending.  RUQ [place] 10/18: Cholelithiasis without sonographic evidence acute cholecystitis. A hepatobiliary scintigraphy may provide better evaluation of the gallbladder if an acute cholecystitis is clinically suspected. Fatty Liver.  CXR 10/19: No pulmonary edema. Stable endotracheal tube position. Hazy right infrahilar and right base atelectasis or infiltrate CXR 10/22: no acute abnormality  X-ray R Foot 10/21: no osteomyelitis   HIV 10/22: non reactive  Hep C 10/22: negative Hep B 10/22: negative CT chest/abd/pelvis 10/25: Interval improvement in bibasilar airspace opacities. There are residual reticulonodular densities at both lung bases, worse on the right. This could be secondary to aspiration or atypical infection. Multiple gallstones with persistent gallbladder distention and possible mild pericholecystic inflammation. Appearance is  similar to previous CT, and ultrasound done 6 days ago did not show findings highly suspicious for cholecystitis. If that is a clinical concern, consider hepatobiliary scan. Small amount of ascites without evidence of abscess.   MICROBIOLOGY: MRSA PCR 10/12:  Negative Blood Ctx x2 10/12 >> 1/2 Coag Neg Staph; 1/2 NG x 5 days  Urine Ctx 10/12:  Negative  Tracheal Asp Ctx 10/13 >> MRSA Respiratory Viral Panel PCR 10/13:  Negative  Blood Cx 10/21: PCR enterococcus, coag neg staph MR. Cx- coag neg staph in one bottle (likley contaminant); 2nd bottle No growth x 5 days Tracheal Aspirate cx 10/21: MRSA Urine Culture 10/21: No growth final   ANTIBIOTICS: Zosyn 10/12 x1 dose; 10/16 >>10/17 Vancomycin 10/12 >>10/18 Cefepime 10/12 >> 10/16 Vancomycin 10/22 >> Ampicillin 10/22 >>10/24  SIGNIFICANT EVENTS: 10/12 - Admit &  intubation in ED 10/14 - switched from Levo to Neo due to intermittent tachycardia  10/15 - off pressor 10/17 - Failed wean 10/18 - failed wean started lasix; Completed Vanc for PNA  10/19 - Failed wean  10/20 - continues to have fevers despite completing abx therapy for PNA. ID consulted.  ESR and CRP elevated.  10/22 - possible bacteremia with Enterococcus/MRSA on PCA with pending cultures. Started on Vanc and Ampicillin; Lasix switched from TID to BID.  10/24 - discontinued Ampicillin per ID. Had Aflutter started on amiodarone drip.    LINES/TUBES: OETT 7.5 10/12 >>  RUE PICC (SNF) 10/11 >>   Foley 10/12 >> PEG >> PIV x1  ASSESSMENT / PLAN:  Mr. Worlds is a  60 y.o. male with PMH CHF, CAD s/p remote stent, DM, HTN, Aflutter s/p ablation on anticoagulation, severe PVD s/p R BKA. Presenting with septic shock likely from MRSA PNA. Is on Heparin for history of a flutter.  Attempted SBT but did not pass; Possibly fluid overloaded therefore diuresing wtih Lasix. Continues to have fevers despite completing antibiotic therapy for MRSA PNA. He was found to have possible  bacteremia with Enterococcus/coagulase neg MR staph on PCR in 1/2 bottles with pending cultures from 10/22 and started on Vancomycin and Ampicillin. However this grew coag negative staph in one bottle which is likely a contaminant and therefore Ampicillin was discontinued 10/24, and Vancomycin was continued for treatment of MRSA PNA. Seems like his fever curve is improving. HR is still fluctuating while on Amiodarone drip.   INFECTIOUS A: Severe Sepsis, resolved RLL MRSA PNA: tracheal aspirate culture with MRSA which was treated but repeat cx grew MRSA. Continued fevers, fever curve improving: unlikely bacteremia thus far as 1 bottle with coag negative staph. CT chest/abd/pelvis negative for abscess  P:   S/p Vancomycin 10/12-10/18; Restarted Vanc 10/22 > (day #13 total) S/p Cefepime and Zosyn complete Ampicillin 10/22 >>>10/24 ID Consulted: Would treat for total of 14 days of Vancomycin for MRSA PNA Hepatitis Panel HCV negative HBV S ag negative  CT of chest, abdomen and pelvis negative for abscess  PULMONARY A: Acute Hypoxic Respiratory Failure - likely due to PNA Respiratory Alkalosis - improving  P:   Full Vent Support Family does not wish for a trach/peg, awaiting arrival of family for withdrawal Intermittent CXR & ABG. Albuterol nebs prn.  CARDIOVASCULAR A: Hypotension: Maps mainly in the 70s.  Shock - Likely septic. Resolved and off presors.  Elevated Troponin I - Likely demand ischemia. Echo was difficult to interpret. Questionable apical akinesis. Aortic Stenosis - Seen on poor quality TTE. H/O CAD w/ Stent H/O CHF - Dilated LV w/ decreased function November 2016. A Flutter/Afib - S/P ablation; HR fluctuating on amiodarone   P:  Amiodarone drip  Metoprolol IV PRN HR >130  Heparin drip. Continuous telemetry monitoring. Vitals per unit protocol. ASA '81mg'$  VT daily.  RENAL A: Acute Renal Failure - Creatinine improving.  Had renal US earlier in hospital stay which  was unremarkable. Hypernatremia - resolved Hypophosphatemia - Resolved Hypokalemia - 3.1 this AM Hypomagnesemia - resolved Lactic Acidosis - Resolved. AGMA - Resolved.   P:   Monitoring UOP with Foley. Trending Electrolytes and Renal function. Free water 250cc q6 hrs.  GASTROINTESTINAL A: Loose stools: c diff negative  RUQ tenderness : RUQ [place] with cholelithiasis without sonographic evidence of cholecystitis. Tenderness resolved.  RLQ Tenderness: Resolved; initially reported by patient  P:   NPO Tube feeds Protonix VT daily  HEMATOLOGIC A:  Leukocytosis -  Down-trending Anemia - stable hgb. S/p 1 unit pRBC 10/16. No signs of active bleeding  Coagulopathy - resolved; on coumadin at home  P:  Trending cell counts daily w/ CBC. Transfuse for Hgb <7.0. Heparin Drip.  ENDOCRINE A: H/O DM - A1c 10.6 in June 2017. A1c 7.0 this admission. CBGs in 200s in the setting of tube feeds   P:   SSI resistant Scheduled Novolog 7 units q 4 hrs (only with tube feeds)  Lantus 40 Units daily Accu-Checks q4hr  NEUROLOGIC A: Acute Encephalopathy - Multifactorial from hypoxia and probable toxic metabolic. Sedation on Ventilator  P:   RASS goal: 0 to -1. Precedex gtt d/c 10/25. Versed IV prn sedation. PRN fentanyl Klonipin 1 BID.  INTEGUMENT:  A: Sacral Pressure Injury  P: - Wound care: Santyl with saline moistened gauze daily covered with dry gauze   Awaiting arrival of family to determine plan of care, terminal extubation with comfort care.  The patient is critically ill with multiple organ systems failure and requires high complexity decision making for assessment and support, frequent evaluation and titration of therapies, application of advanced monitoring technologies and extensive interpretation of multiple databases.   Critical Care Time devoted to patient care services described in this note is  35  Minutes. This time reflects time of care of this signee Dr Jennet Maduro. This critical care time does not reflect procedure time, or teaching time or supervisory time of PA/NP/Med student/Med Resident etc but could involve care discussion time.  Rush Farmer, M.D. [facility] Pulmonary/Critical Care Medicine. Pager: [phone]. After hours pager: [phone].

## 2016-05-06 NOTE — Progress Notes (Signed)
ANTICOAGULATION CONSULT NOTE  Pharmacy Consult for Heparin Indication: atrial fibrillation  Allergies  Allergen Reactions  . Niaspan [Niacin Er] Hives and Other (See Comments)    Reaction:  Facial redness/hotness     Patient Measurements: Height: 5\' 10"  (177.8 cm) Weight: 207 lb 0.2 oz (93.9 kg) IBW/kg (Calculated) : 73  Heparin Wt: 97 kg  Vital Signs: Temp: 99.6 F (37.6 C) (10/27 0358) Temp Source: Oral (10/27 0358) BP: 112/76 (10/27 0500) Pulse Rate: 95 (10/27 0500)  Labs:  Recent Labs  05/04/16 0430 05/05/16 0458 05/05/16 1600 05/06/16 0500  HGB 8.6* 8.9*  --  8.3*  HCT 30.2* 30.1*  --  27.5*  PLT 267 336  --  280  HEPARINUNFRC 0.63 0.54  --  0.38  CREATININE 1.39* 1.22 1.15  --     Estimated Creatinine Clearance: 76.7 mL/min (by C-G formula based on SCr of 1.15 mg/dL).  Assessment: [age] yoM with atrial flutter on warfarin PTA now on heparin per pharmacy dosing. Abdominal US negative for a bleed. Heparin level continues to be therapeutic on 2000 units/hr at 0.38 this morning. No issues with bleeding noted, Hgb 8.3, platelets remain stable.   Goal of Therapy: Heparin level 0.3 - 0.7 units/mL Monitor platelets by anticoagulation protocol: Yes  Plan:  - Continue heparin 2000 units/hr - Monitor daily heparin level, CBC, S/Sx bleeding  Fredonia HighlandMichael Bitonti, PharmD PGY-1 Pharmacy Resident Pager: [phone] 05/06/2016

## 2016-05-07 ENCOUNTER — Inpatient Hospital Stay (HOSPITAL_COMMUNITY): Payer: Medicare HMO

## 2016-05-07 DIAGNOSIS — Z515 Encounter for palliative care: Secondary | ICD-10-CM

## 2016-05-07 DIAGNOSIS — A4902 Methicillin resistant Staphylococcus aureus infection, unspecified site: Secondary | ICD-10-CM

## 2016-05-07 LAB — BLOOD GAS, ARTERIAL
Acid-Base Excess: 1.8 mmol/L (ref 0.0–2.0)
Bicarbonate: 25.3 mmol/L (ref 20.0–28.0)
Drawn by: 398661
FIO2: 40
O2 SAT: 99.7 %
PEEP: 5 cmH2O
PH ART: 7.461 — AB (ref 7.350–7.450)
Patient temperature: 98.8
RATE: 18 resp/min
VT: 500 mL
pCO2 arterial: 36.1 mmHg (ref 32.0–48.0)
pO2, Arterial: 160 mmHg — ABNORMAL HIGH (ref 83.0–108.0)

## 2016-05-07 LAB — CBC
HEMATOCRIT: 25.1 % — AB (ref 39.0–52.0)
HEMOGLOBIN: 7.5 g/dL — AB (ref 13.0–17.0)
MCH: 27.4 pg (ref 26.0–34.0)
MCHC: 29.9 g/dL — ABNORMAL LOW (ref 30.0–36.0)
MCV: 91.6 fL (ref 78.0–100.0)
Platelets: 236 10*3/uL (ref 150–400)
RBC: 2.74 MIL/uL — ABNORMAL LOW (ref 4.22–5.81)
RDW: 19.4 % — AB (ref 11.5–15.5)
WBC: 10.6 10*3/uL — AB (ref 4.0–10.5)

## 2016-05-07 LAB — BASIC METABOLIC PANEL
ANION GAP: 8 (ref 5–15)
BUN: 55 mg/dL — ABNORMAL HIGH (ref 6–20)
CALCIUM: 8.5 mg/dL — AB (ref 8.9–10.3)
CO2: 25 mmol/L (ref 22–32)
Chloride: 109 mmol/L (ref 101–111)
Creatinine, Ser: 0.93 mg/dL (ref 0.61–1.24)
GFR calc non Af Amer: >60 mL/min (ref >60)
Glucose, Bld: 238 mg/dL — ABNORMAL HIGH (ref 65–99)
Potassium: 4 mmol/L (ref 3.5–5.1)
Sodium: 142 mmol/L (ref 135–145)

## 2016-05-07 LAB — PHOSPHORUS: PHOSPHORUS: 3.4 mg/dL (ref 2.5–4.6)

## 2016-05-07 LAB — GLUCOSE, CAPILLARY
GLUCOSE-CAPILLARY: 191 mg/dL — AB (ref 65–99)
Glucose-Capillary: 186 mg/dL — ABNORMAL HIGH (ref 65–99)

## 2016-05-07 LAB — MAGNESIUM: MAGNESIUM: 2.4 mg/dL (ref 1.7–2.4)

## 2016-05-07 LAB — HEPARIN LEVEL (UNFRACTIONATED): HEPARIN UNFRACTIONATED: 0.24 [IU]/mL — AB (ref 0.30–0.70)

## 2016-05-07 MED ORDER — MORPHINE BOLUS VIA INFUSION
5.0000 mg | INTRAVENOUS | Status: DC | PRN
  Administered 2016-05-07: 5 mg via INTRAVENOUS
  Filled 2016-05-07: qty 20

## 2016-05-07 MED ORDER — SODIUM CHLORIDE 0.9 % IV SOLN
10.0000 mg/h | INTRAVENOUS | Status: DC
  Administered 2016-05-07: 2 mg/h via INTRAVENOUS
  Filled 2016-05-07: qty 10

## 2016-05-07 MED ORDER — SODIUM CHLORIDE 0.9 % IV SOLN
10.0000 mg/h | INTRAVENOUS | Status: DC
  Administered 2016-05-07: 3 mg/h via INTRAVENOUS
  Administered 2016-05-08: 4 mg/h via INTRAVENOUS
  Filled 2016-05-07 (×2): qty 10

## 2016-05-07 MED ORDER — MIDAZOLAM BOLUS VIA INFUSION (WITHDRAWAL LIFE SUSTAINING TX)
5.0000 mg | INTRAVENOUS | Status: DC | PRN
  Administered 2016-05-07: 2 mg via INTRAVENOUS
  Filled 2016-05-07: qty 20

## 2016-05-07 NOTE — Procedures (Signed)
Extubation Procedure Note  Patient Details:   Name: Ree KidaLarry K Witting DOB: Apr 23, 1956 MRN: [id number]   Airway Documentation:     Evaluation  O2 sats: stable throughout Complications: No apparent complications Patient did tolerate procedure well. Bilateral Breath Sounds: Clear, Diminished   No   Terminal extubation per families wishes. Pt extubated to room air.   Gaetano HawthorneBrooker, Jacquline Terrill 05/07/2016, 9:02 PM

## 2016-05-07 NOTE — Progress Notes (Signed)
   05/07/16 2125  Clinical Encounter Type  Visited With Patient and family together  Visit Type Critical Care  Referral From Nurse  Consult/Referral To Chaplain  Spiritual Encounters  Spiritual Needs Prayer;Emotional  Stress Factors  Patient Stress Factors Major life changes  Family Stress Factors Family relationships;Health changes  Offered prayer and emotional support to family. Pt taken off life support.

## 2016-05-07 NOTE — Progress Notes (Signed)
Pt placed back on full vent support due to increased RR > 35.

## 2016-05-07 NOTE — Progress Notes (Signed)
Spoke with son and sister.  They are waiting on one more family member then will be ready for extubation.  Morphine and versed started and extubation order is on the chart.  When family is ready will terminally extubate.  Alyson ReedyWesam G. Kamaryn Grimley, M.D. [facility]/Critical Care Medicine. Pager: [phone]. After hours pager: [phone].

## 2016-05-07 NOTE — Progress Notes (Signed)
ANTICOAGULATION CONSULT NOTE  Pharmacy Consult for Heparin Indication: atrial fibrillation  Allergies  Allergen Reactions  . Niaspan [Niacin Er] Hives and Other (See Comments)    Reaction:  Facial redness/hotness     Patient Measurements: Height: 5\' 10"  (177.8 cm) Weight: 207 lb 0.2 oz (93.9 kg) IBW/kg (Calculated) : 73  Heparin Wt: 97 kg  Vital Signs: Temp: 98.8 F (37.1 C) (10/28 0404) Temp Source: Oral (10/28 0404) BP: 124/80 (10/28 0200) Pulse Rate: 82 (10/28 0357)  Labs:  Recent Labs  05/05/16 0458 05/05/16 1600 05/06/16 0500 05/07/16 0529  HGB 8.9*  --  8.3* 7.5*  HCT 30.1*  --  27.5* 25.1*  PLT 336  --  280 236  HEPARINUNFRC 0.54  --  0.38 0.24*  CREATININE 1.22 1.15 1.11  --     Estimated Creatinine Clearance: 79.4 mL/min (by C-G formula based on SCr of 1.11 mg/dL).  Assessment: [age] yoM with atrial flutter on warfarin PTA now on heparin per pharmacy dosing. Abdominal US negative for a bleed. Heparin level low this AM.   Goal of Therapy: Heparin level 0.3 - 0.7 units/mL Monitor platelets by anticoagulation protocol: Yes  Plan:  -Increase heparin to 2150 units/hr -1400 HL  Abran DukeJames Nyal Schachter, PharmD Clinical Pharmacist

## 2016-05-07 NOTE — Progress Notes (Signed)
Patient somnolent but able to nod appropriately, pt slowly reaching for et tube, pt denies pain and tolerates repositioning, pt son and family remain at bedside. RT notified pt family requesting pt be extubated.

## 2016-05-07 NOTE — Progress Notes (Signed)
PULMONARY / CRITICAL CARE MEDICINE   Name: Clayton [name] MRN: [id number] DOB: 12/27/1955    ADMISSION DATE:  05/07/2016  REFERRING MD:  Alvino Chapel (EDP)   CHIEF COMPLAINT:  Sepsis, respiratory failure   HISTORY OF PRESENT ILLNESS:   60yo male SNF resident with hx CHF, CAD s/p remote stent, DM, HTN, Aflutter s/p ablation on coumadin, severe PVD s/p R BKA, being treated for unknown infection at SNF with rocephin (x 1 dose).  Presented 10/12 from SNF after being found unresponsive with agonal breaths.  Bagged by EMS en route and intubated on arrival to ER.  In ER was febrile to 104, hypotensive with SBP 70's.  Labs notable for AKI with K 7.1, glucose 500, WBC 36, lactate 3.6.  PCCM consulted for admit.   SUBJECTIVE:  No acute events overnight. Fever curve is improving since 10/25 with peak temp of 100.5 over the past 24 hours. HR is still fluctuates in 1-teens to 120s while on Amiodarone drip which was restarted 10/26 AM; he has required PRN Metoprolol as well. BP stable. Diuresed yesterday with Lasix IV 40x 3 with 2L out; He is net 5.5 L positive (from positive 4.4L yesterday).   REVIEW OF SYSTEMS:  Answers questions with nodding/shanking of head. No pain. Tried to wean earlier this am x3 but had  increased wob.  VITAL SIGNS: BP (!) 102/46   Pulse 87   Temp 99.6 F (37.6 C) (Oral)   Resp (!) 28   Ht '5\' 10"'$  (1.778 m)   Wt 207 lb 0.2 oz (93.9 kg)   SpO2 100%   BMI 29.70 kg/m   HEMODYNAMICS:    VENTILATOR SETTINGS: Vent Mode: CPAP;PSV FiO2 (%):  [40 %] 40 % Set Rate:  [18 bmp] 18 bmp Vt Set:  [500 mL] 500 mL PEEP:  [5 cmH20] 5 cmH20 Pressure Support:  [10 cmH20] 10 cmH20 Plateau Pressure:  [19 cmH20-26 cmH20] 20 cmH20  INTAKE / OUTPUT: I/O last 3 completed shifts: In: 3986.3 [I.V.:1686.3; NG/GT:1800; IV Piggyback:500] Out: 2780 [Urine:2680; Stool:100]  PHYSICAL EXAMINATION:  General:  NAD  Neuro: alert ,follows simple commands, shakes head to questions. HEENT:  ETT in  place. No scleral icterus or injection. Cardiovascular: regular rhythm. No appreciable JVD. Lungs: coarse breath sounds. Symmetric chest rise on ventilator.  Abdomen:  Normal bowel bowel sounds. PEG tube in place without surrounding erythema, Nontender, + BS Musculoskeletal:  Warm and dry. 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 Right foot is wrapped in soft support.   LABS:  BMET  Recent Labs Lab 05/05/16 1600 05/06/16 0500 05/07/16 0529  NA 144 141 142  K 3.4* 3.1* 4.0  CL 109 107 109  CO2 '26 26 25  '$ BUN 58* 59* 55*  CREATININE 1.15 1.11 0.93  GLUCOSE 238* 260* 238*    Electrolytes  Recent Labs Lab 05/05/16 0458 05/05/16 1600 05/06/16 0500 05/07/16 0529  CALCIUM 8.2* 8.5* 8.4* 8.5*  MG 1.8  --  1.9 2.4  PHOS 3.5  --  3.4 3.4    CBC  Recent Labs Lab 05/05/16 0458 05/06/16 0500 05/07/16 0529  WBC 16.8* 14.0* 10.6*  HGB 8.9* 8.3* 7.5*  HCT 30.1* 27.5* 25.1*  PLT 336 280 236    Coag's No results for input(s): APTT, INR in the last 168 hours.  Sepsis Markers  Recent Labs Lab 05/01/16 0321 05/03/16 0420  PROCALCITON 0.34 0.24    ABG  Recent Labs Lab 05/05/16 0405 05/06/16 0355 05/07/16 0408  PHART 7.515* 7.502* 7.461*  PCO2ART  30.7* 32.1 36.1  PO2ART 174* 165* 160*    Liver Enzymes  Recent Labs Lab 05/01/16 0321 05/02/16 0415 05/03/16 0420  ALBUMIN 2.1* 2.3* 2.2*    Cardiac Enzymes No results for input(s): TROPONINI, PROBNP in the last 168 hours.  Glucose  Recent Labs Lab 05/06/16 0356 05/06/16 0725 05/06/16 1104 05/06/16 1537 05/06/16 1946 05/06/16 2318  GLUCAP 240* 248* 255* 194* 218* 194*    Imaging Dg Chest Port 1 View  Result Date: 05/07/2016 CLINICAL DATA:  Acute respiratory failure with hypoxia. Sepsis. Endotracheal tube. EXAM: PORTABLE CHEST 1 VIEW COMPARISON:  05/06/2016 FINDINGS: Endotracheal tube and PICC appear in good position, unchanged. Heart size and pulmonary vascularity are normal and the  lungs are clear. CABG. Healing fracture of the anterior aspect of the right second rib. IMPRESSION: No acute cardiopulmonary disease.  No change since the prior exam. Electronically Signed   By: Lorriane Shire M.D.   On: 05/07/2016 08:49     STUDIES:  Renal U/S 10/12:  No abnormality or hydronephrosis.  TTE 10/13:  Difficult study. Mild LVH w/ EF 55%. Possible akinesis apical myocardium without obvious thrombus.Moderate aortic stenosis w/ mild mitral stenosis. RV poorly visualized. CT abdomen 10/15: no evidence for retroperitoneal hemorrhage. RLL central airspace disease compatible with aspiration and/or PNA CXR: 10/17: unchanged from 10/16. No consolidation. Final read pending.  RUQ [place] 10/18: Cholelithiasis without sonographic evidence acute cholecystitis. A hepatobiliary scintigraphy may provide better evaluation of the gallbladder if an acute cholecystitis is clinically suspected. Fatty Liver.  CXR 10/19: No pulmonary edema. Stable endotracheal tube position. Hazy right infrahilar and right base atelectasis or infiltrate CXR 10/22: no acute abnormality  X-ray R Foot 10/21: no osteomyelitis   HIV 10/22: non reactive  Hep C 10/22: negative Hep B 10/22: negative CT chest/abd/pelvis 10/25: Interval improvement in bibasilar airspace opacities. There are residual reticulonodular densities at both lung bases, worse on the right. This could be secondary to aspiration or atypical infection. Multiple gallstones with persistent gallbladder distention and possible mild pericholecystic inflammation. Appearance is similar to previous CT, and ultrasound done 6 days ago did not show findings highly suspicious for cholecystitis. If that is a clinical concern, consider hepatobiliary scan. Small amount of ascites without evidence of abscess.   MICROBIOLOGY: MRSA PCR 10/12:  Negative Blood Ctx x2 10/12 >> 1/2 Coag Neg Staph; 1/2 NG x 5 days  Urine Ctx 10/12:  Negative  Tracheal Asp Ctx 10/13 >>  MRSA Respiratory Viral Panel PCR 10/13:  Negative  Blood Cx 10/21: PCR enterococcus, coag neg staph MR. Cx- coag neg staph in one bottle (likley contaminant); 2nd bottle No growth x 5 days Tracheal Aspirate cx 10/21: MRSA Urine Culture 10/21: No growth final   ANTIBIOTICS: Zosyn 10/12 x1 dose; 10/16 >>10/17 Vancomycin 10/12 >>10/18 Cefepime 10/12 >> 10/16 Vancomycin 10/22 >> Ampicillin 10/22 >>10/24  SIGNIFICANT EVENTS: 10/12 - Admit & intubation in ED 10/14 - switched from Levo to Neo due to intermittent tachycardia  10/15 - off pressor 10/17 - Failed wean 10/18 - failed wean started lasix; Completed Vanc for PNA  10/19 - Failed wean  10/20 - continues to have fevers despite completing abx therapy for PNA. ID consulted.  ESR and CRP elevated.  10/22 - possible bacteremia with Enterococcus/MRSA on PCA with pending cultures. Started on Vanc and Ampicillin; Lasix switched from TID to BID.  10/24 - discontinued Ampicillin per ID. Had Aflutter started on amiodarone drip.    LINES/TUBES: OETT 7.5 10/12 >>  RUE PICC (SNF)  10/11 >>   Foley 10/12 >> PEG >> PIV x1  ASSESSMENT / PLAN:  Mr. Voong is a  60 y.o. male with PMH CHF, CAD s/p remote stent, DM, HTN, Aflutter s/p ablation on anticoagulation, severe PVD s/p R BKA. Presenting with septic shock likely from MRSA PNA. Is on Heparin for history of a flutter.  Attempted SBT but did not pass; Possibly fluid overloaded therefore diuresing wtih Lasix. Continues to have fevers despite completing antibiotic therapy for MRSA PNA. He was found to have possible bacteremia with Enterococcus/coagulase neg MR staph on PCR in 1/2 bottles with pending cultures from 10/22 and started on Vancomycin and Ampicillin. However this grew coag negative staph in one bottle which is likely a contaminant and therefore Ampicillin was discontinued 10/24, and Vancomycin was continued for treatment of MRSA PNA. Seems like his fever curve is improving. HR is still  fluctuating while on Amiodarone drip. Trying to wean but increased wob .   INFECTIOUS A: Severe Sepsis, resolved RLL MRSA PNA: tracheal aspirate culture with MRSA which was treated but repeat cx grew MRSA. Fevers improving  unlikely bacteremia thus far as 1 bottle with coag negative staph. CT chest/abd/pelvis negative for abscess  P:   S/p Vancomycin 10/12-10/18; Restarted Vanc 10/22 > (day #14 total) S/p Cefepime and Zosyn complete Ampicillin 10/22 >>>10/24 Hepatitis Panel HCV negative HBV S ag negative  CT of chest, abdomen and pelvis negative for abscess Terminal extubation today.  PULMONARY A: Acute Hypoxic Respiratory Failure - likely due to PNA Respiratory Alkalosis - improving  P:   Full Vent Support until family arrives then terminal extubation today once family arrives at 4:30 PM. D/C ABG and CXR. Albuterol nebs prn.  CARDIOVASCULAR A: Hypotension: Maps mainly in the 70s.  Shock - Likely septic. Resolved and off presors.  Elevated Troponin I - Likely demand ischemia. Echo was difficult to interpret. Questionable apical akinesis. Aortic Stenosis - Seen on poor quality TTE. H/O CAD w/ Stent H/O CHF - Dilated LV w/ decreased function November 2016. A Flutter/Afib - S/P ablation; HR fluctuating on amiodarone   P:  D/C Amiodarone drip upon withdrawal.  Metoprolol IV PRN HR >130  D/C heparin drip upon withdrawal. Continuous telemetry monitoring. Vitals per unit protocol.  RENAL A: Acute Renal Failure - Creatinine improving.  Had renal US earlier in hospital stay which was unremarkable. Hypernatremia - resolved Hypophosphatemia - Resolved Hypokalemia - improved  Hypomagnesemia - resolved Lactic Acidosis - Resolved AGMA - Resolved   P:   D/C further blood draws.  GASTROINTESTINAL A: Loose stools: c diff negative  RUQ tenderness : RUQ [place] with cholelithiasis without sonographic evidence of cholecystitis. Tenderness resolved.  RLQ Tenderness: Resolved;  initially reported by patient  P:   NPO D/C Tube feeds  HEMATOLOGIC A: Leukocytosis -  Down-trending Anemia - stable hgb. S/p 1 unit pRBC 10/16. No signs of active bleeding  Coagulopathy - resolved; on coumadin at home  P:  D/C further blood draws  ENDOCRINE A: H/O DM - A1c 10.6 in June 2017. A1c 7.0 this admission. CBGs in 200s in the setting of tube feeds   P:   D/C CBGs and ISS.  NEUROLOGIC A: Acute Encephalopathy - Multifactorial from hypoxia and probable toxic metabolic. Sedation on Ventilator  P:   Versed and morphine drips for comfort.  INTEGUMENT:  A: Sacral Pressure Injury  P: - Pain control.  Tammy Parrett NP-C  [facility]  [phone]  Attending Note:  Above note edited  in full.  60 year old male with extensive PMH who presents with pneumonia and respiratory failure. On exam, patient is agitated and not following commands but eyes are open and he is able to track, lungs are clear bilaterally.  I reviewed CXR myself, ETT ok.  Spoke with son over the phone (he is in town from [place] but unable to come to the hospital).  After discussion, decision was made to make patient a full DNR.  As a family, they will all be here at 4:30 PM and will be ready for withdrawal.  Will place withdrawal order set in with morphine and versed and when family gets here will start them and terminally extubate.    The patient is critically ill with multiple organ systems failure and requires high complexity decision making for assessment and support, frequent evaluation and titration of therapies, application of advanced monitoring technologies and extensive interpretation of multiple databases.   Critical Care Time devoted to patient care services described in this note is  45  Minutes. This time reflects time of care of this signee Dr Jennet Maduro. This critical care time does not reflect procedure time, or teaching time or supervisory time of PA/NP/Med  student/Med Resident etc but could involve care discussion time.  Rush Farmer, M.D. [facility] Pulmonary/Critical Care Medicine. Pager: [phone]. After hours pager: [phone].  05/07/2016

## 2016-05-08 ENCOUNTER — Inpatient Hospital Stay (HOSPITAL_COMMUNITY): Payer: Medicare HMO

## 2016-05-09 LAB — GLUCOSE, CAPILLARY: Glucose-Capillary: 210 mg/dL — ABNORMAL HIGH (ref 65–99)

## 2016-05-11 ENCOUNTER — Telehealth: Payer: Self-pay

## 2016-05-11 NOTE — Progress Notes (Signed)
Pt passed away at 1306 with son at bedside.

## 2016-05-11 NOTE — Progress Notes (Signed)
[facility].  Referral # [id number].  May be an organ donor, will do eye care after family leaves.

## 2016-05-11 NOTE — Progress Notes (Signed)
Melissa from [facility] called back and pt will not be suitable to be a donor.  Body can be released once family gone.

## 2016-05-11 NOTE — Progress Notes (Addendum)
PULMONARY / CRITICAL CARE MEDICINE   Name: Clayton [name] MRN: [id number] DOB: 01/23/1956    ADMISSION DATE:  05/01/2016  REFERRING MD:  Alvino Chapel (EDP)   CHIEF COMPLAINT:  Sepsis, respiratory failure   HISTORY OF PRESENT ILLNESS:   60yo male SNF resident with hx CHF, CAD s/p remote stent, DM, HTN, Aflutter s/p ablation on coumadin, severe PVD s/p R BKA, being treated for unknown infection at SNF with rocephin (x 1 dose).  Presented 10/12 from SNF after being found unresponsive with agonal breaths.  Bagged by EMS en route and intubated on arrival to ER.  In ER was febrile to 104, hypotensive with SBP 70's.  Labs notable for AKI with K 7.1, glucose 500, WBC 36, lactate 3.6.  PCCM consulted for admit.   SUBJECTIVE:  Terminal extubation on 10/28, continues to breath well.  REVIEW OF SYSTEMS:  Answers questions with nodding/shanking of head. No pain. Tried to wean earlier this am x3 but had  increased wob.  VITAL SIGNS: BP (!) 110/57   Pulse 100   Temp 99.1 F (37.3 C) (Oral)   Resp (!) 26   Ht _0  (1.778 m)   Wt 93.9 kg (207 lb 0.2 oz)   SpO2 (!) 67%   BMI 29.70 kg/m   HEMODYNAMICS:    VENTILATOR SETTINGS: Vent Mode: PRVC FiO2 (%):  [40 %] 40 % Set Rate:  [15 bmp-18 bmp] 18 bmp Vt Set:  [500 mL] 500 mL PEEP:  [5 cmH20] 5 cmH20 Pressure Support:  [10 cmH20] 10 cmH20 Plateau Pressure:  [18 cmH20-21 cmH20] 18 cmH20  INTAKE / OUTPUT: I/O last 3 completed shifts: In: 3041.1 [I.V.:1091.1; NG/GT:1750; IV Piggyback:200] Out: 385 [Urine:385]  PHYSICAL EXAMINATION:  General:  Appears comfortable, extubated Neuro: Sedate, comfortable, breathing HEENT:  Extubated, [place]/AT, PERRL, EOM-I Cardiovascular: regular rhythm. No appreciable JVD. Lungs: Transmitted upper airway sounds Abdomen:  Hypoactive bowel sounds, soft, NT, ND. Musculoskeletal:  Amputations noted.  LABS:  BMET  Recent Labs Lab 05/05/16 1600 05/06/16 0500 05/07/16 0529  NA 144 141 142  K 3.4* 3.1* 4.0   CL 109 107 109  CO2 _1 BUN 58* 59* 55*  CREATININE 1.15 1.11 0.93  GLUCOSE 238* 260* 238*   Electrolytes  Recent Labs Lab 05/05/16 0458 05/05/16 1600 05/06/16 0500 05/07/16 0529  CALCIUM 8.2* 8.5* 8.4* 8.5*  MG 1.8  --  1.9 2.4  PHOS 3.5  --  3.4 3.4   CBC  Recent Labs Lab 05/05/16 0458 05/06/16 0500 05/07/16 0529  WBC 16.8* 14.0* 10.6*  HGB 8.9* 8.3* 7.5*  HCT 30.1* 27.5* 25.1*  PLT 336 280 236   Coag's No results for input(s): APTT, INR in the last 168 hours.  Sepsis Markers  Recent Labs Lab 05/03/16 0420  PROCALCITON 0.24   ABG  Recent Labs Lab 05/05/16 0405 05/06/16 0355 05/07/16 0408  PHART 7.515* 7.502* 7.461*  PCO2ART 30.7* 32.1 36.1  PO2ART 174* 165* 160*   Liver Enzymes  Recent Labs Lab 05/02/16 0415 05/03/16 0420  ALBUMIN 2.3* 2.2*   Cardiac Enzymes No results for input(s): TROPONINI, PROBNP in the last 168 hours.  Glucose  Recent Labs Lab 05/06/16 1104 05/06/16 1537 05/06/16 1946 05/06/16 2318 05/07/16 0758 05/07/16 1159  GLUCAP 255* 194* 218* 194* 186* 191*   Imaging No results found.  STUDIES:  Renal U/S 10/12:  No abnormality or hydronephrosis.  TTE 10/13:  Difficult study. Mild LVH w/ EF 55%. Possible akinesis apical myocardium without obvious thrombus.Moderate aortic  stenosis w/ mild mitral stenosis. RV poorly visualized. CT abdomen 10/15: no evidence for retroperitoneal hemorrhage. RLL central airspace disease compatible with aspiration and/or PNA CXR: 10/17: unchanged from 10/16. No consolidation. Final read pending.  RUQ [place] 10/18: Cholelithiasis without sonographic evidence acute cholecystitis. A hepatobiliary scintigraphy may provide better evaluation of the gallbladder if an acute cholecystitis is clinically suspected. Fatty Liver.  CXR 10/19: No pulmonary edema. Stable endotracheal tube position. Hazy right infrahilar and right base atelectasis or infiltrate CXR 10/22: no acute abnormality  X-ray R  Foot 10/21: no osteomyelitis   HIV 10/22: non reactive  Hep C 10/22: negative Hep B 10/22: negative CT chest/abd/pelvis 10/25: Interval improvement in bibasilar airspace opacities. There are residual reticulonodular densities at both lung bases, worse on the right. This could be secondary to aspiration or atypical infection. Multiple gallstones with persistent gallbladder distention and possible mild pericholecystic inflammation. Appearance is similar to previous CT, and ultrasound done 6 days ago did not show findings highly suspicious for cholecystitis. If that is a clinical concern, consider hepatobiliary scan. Small amount of ascites without evidence of abscess.  MICROBIOLOGY: MRSA PCR 10/12:  Negative Blood Ctx x2 10/12 >> 1/2 Coag Neg Staph; 1/2 NG x 5 days  Urine Ctx 10/12:  Negative  Tracheal Asp Ctx 10/13 >> MRSA Respiratory Viral Panel PCR 10/13:  Negative  Blood Cx 10/21: PCR enterococcus, coag neg staph MR. Cx- coag neg staph in one bottle (likley contaminant); 2nd bottle No growth x 5 days Tracheal Aspirate cx 10/21: MRSA Urine Culture 10/21: No growth final   ANTIBIOTICS: Zosyn 10/12 x1 dose; 10/16 >>10/17 Vancomycin 10/12 >>10/18 Cefepime 10/12 >> 10/16 Vancomycin 10/22 >> Ampicillin 10/22 >>10/24  SIGNIFICANT EVENTS: 10/12 - Admit & intubation in ED 10/14 - switched from Levo to Neo due to intermittent tachycardia  10/15 - off pressor 10/17 - Failed wean 10/18 - failed wean started lasix; Completed Vanc for PNA  10/19 - Failed wean  10/20 - continues to have fevers despite completing abx therapy for PNA. ID consulted.  ESR and CRP elevated.  10/22 - possible bacteremia with Enterococcus/MRSA on PCA with pending cultures. Started on Vanc and Ampicillin; Lasix switched from TID to BID.  10/24 - discontinued Ampicillin per ID. Had Aflutter started on amiodarone drip.   LINES/TUBES: OETT 7.5 10/12 >>  RUE PICC (SNF) 10/11 >>   Foley 10/12 >> PEG >> PIV  x1  Reviewed CXR myself, ETT ok.  ASSESSMENT / PLAN:  Mr. Commins is a  60 y.o. male with PMH CHF, CAD s/p remote stent, DM, HTN, Aflutter s/p ablation on anticoagulation, severe PVD s/p R BKA. Presenting with septic shock likely from MRSA PNA. Is on Heparin for history of a flutter.  Attempted SBT but did not pass; Possibly fluid overloaded therefore diuresing wtih Lasix. Continues to have fevers despite completing antibiotic therapy for MRSA PNA. He was found to have possible bacteremia with Enterococcus/coagulase neg MR staph on PCR in 1/2 bottles with pending cultures from 10/22 and started on Vancomycin and Ampicillin. However this grew coag negative staph in one bottle which is likely a contaminant and therefore Ampicillin was discontinued 10/24, and Vancomycin was continued for treatment of MRSA PNA. Seems like his fever curve is improving. HR is still fluctuating while on Amiodarone drip. Trying to wean but increased wob .   INFECTIOUS A: Severe Sepsis, resolved RLL MRSA PNA: tracheal aspirate culture with MRSA which was treated but repeat cx grew MRSA. Fevers improving  unlikely bacteremia  thus far as 1 bottle with coag negative staph. CT chest/abd/pelvis negative for abscess  P:   D/C abx  PULMONARY A: Acute Hypoxic Respiratory Failure - likely due to PNA Respiratory Alkalosis - improving  P:   O2 for comfort only.  CARDIOVASCULAR A: Hypotension: Maps mainly in the 70s.  Shock - Likely septic. Resolved and off presors.  Elevated Troponin I - Likely demand ischemia. Echo was difficult to interpret. Questionable apical akinesis. Aortic Stenosis - Seen on poor quality TTE. H/O CAD w/ Stent H/O CHF - Dilated LV w/ decreased function November 2016. A Flutter/Afib - S/P ablation; HR fluctuating on amiodarone   P:  D/C tele monitoring.  RENAL A: Acute Renal Failure - Creatinine improving.  Had renal US earlier in hospital stay which was unremarkable. Hypernatremia -  resolved Hypophosphatemia - Resolved Hypokalemia - improved  Hypomagnesemia - resolved Lactic Acidosis - Resolved AGMA - Resolved   P:   D/C further blood draws.  GASTROINTESTINAL A: Loose stools: c diff negative  RUQ tenderness : RUQ [place] with cholelithiasis without sonographic evidence of cholecystitis. Tenderness resolved.  RLQ Tenderness: Resolved; initially reported by patient  P:   NPO D/C Tube feeds   HEMATOLOGIC A: Leukocytosis -  Down-trending Anemia - stable hgb. S/p 1 unit pRBC 10/16. No signs of active bleeding  Coagulopathy - resolved; on coumadin at home  P:  D/C further blood draws  ENDOCRINE A: H/O DM - A1c 10.6 in June 2017. A1c 7.0 this admission. CBGs in 200s in the setting of tube feeds   P:   D/C CBGs and ISS.  NEUROLOGIC A: Acute Encephalopathy - Multifactorial from hypoxia and probable toxic metabolic. Sedation on Ventilator  P:   Versed and morphine drips for comfort.  INTEGUMENT:  A: Sacral Pressure Injury  P: Pain control.  Family updated.  Transfer to palliative care floor.  Discussed with bedside RN and PCCM-NP.  Rush Farmer, M.D. [facility] Pulmonary/Critical Care Medicine. Pager: [phone]. After hours pager: [phone].  June 03, 2016

## 2016-05-11 NOTE — Telephone Encounter (Signed)
On 05/11/2016 I received a death certificate from [employer]. The death certificate is for cremation. The patient is a patient of Doctor Molli KnockYacoub. [facility] this pm for signature.  On 05/12/2016 I received the death certificate back from Doctor Marchelle Gearingamaswamy who signed the death certificate for Doctor Molli KnockYacoub. I got the death certificate ready and called the funeral home to let them know the death certificate is ready for pickup. I also faxed a copy to the funeral home per the funeral home request.

## 2016-05-11 NOTE — Progress Notes (Signed)
30 cc Versed and 100 cc Morphine wasted in sink and witnessed by Blair PromiseJ Bumgarner, RN.  Vergie Livingakita Bulter, RN also pronounced pt dead with Kirtland BouchardJill Kayana Thoen, RN.

## 2016-05-11 NOTE — Progress Notes (Signed)
Attempted to call report. Asked to call back in 10 minutes. 

## 2016-05-11 DEATH — deceased

## 2016-06-10 NOTE — Discharge Summary (Signed)
NAMMarland Kitchen:  [name], Clayton                 ACCOUNT NO.:  [id number]  MEDICAL RECORD NO.:  [id number]  LOCATION:  6N04C                        FACILITY:  [facility]  PHYSICIAN:  Felipa EvenerWesam Jake Yacoub, MD  DATE OF BIRTH:  February 01, 1956  DATE OF ADMISSION:  04/26/2016 DATE OF DISCHARGE:  11-10-2015                              DISCHARGE SUMMARY   FINAL DIAGNOSIS/CAUSE OF DEATH:  Acute respiratory failure with hypoxemia.  SECONDARY DIAGNOSES:  Severe sepsis with septic shock, right lower lobe MRSA pneumonia, acute renal failure, __________ hypokalemia, hypophosphatemia, hyponatremia, leukocytosis, type I diabetes, and acute encephalopathy.  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  In the emergency department, the patient was noted to be in respiratory failure and was intubated by  __________ admit.  The patient was admitted to intensive care unit.  Antibiotics were started.  However, the patient became progressively hypotensive and grew methicillin- resistant Staph aureus in his sputum, and after discussion with the family, the family said the patient would not want this level of care. The patient was started on morphine and extubated __________ and expired shortly thereafter with the family at bedside.     Felipa EvenerWesam Jake Yacoub, MD     WJY/MEDQ  D:  05/17/2016  T:  05/18/2016  Job:  [id number]

## 2017-07-25 IMAGING — CR DG CHEST 1V PORT
1 series · 1 of 1 positions shown · non-contrast
Comparison: 05/01/2016

CLINICAL DATA: Check endotracheal tube placement

EXAM:
PORTABLE CHEST 1 VIEW

[AP]
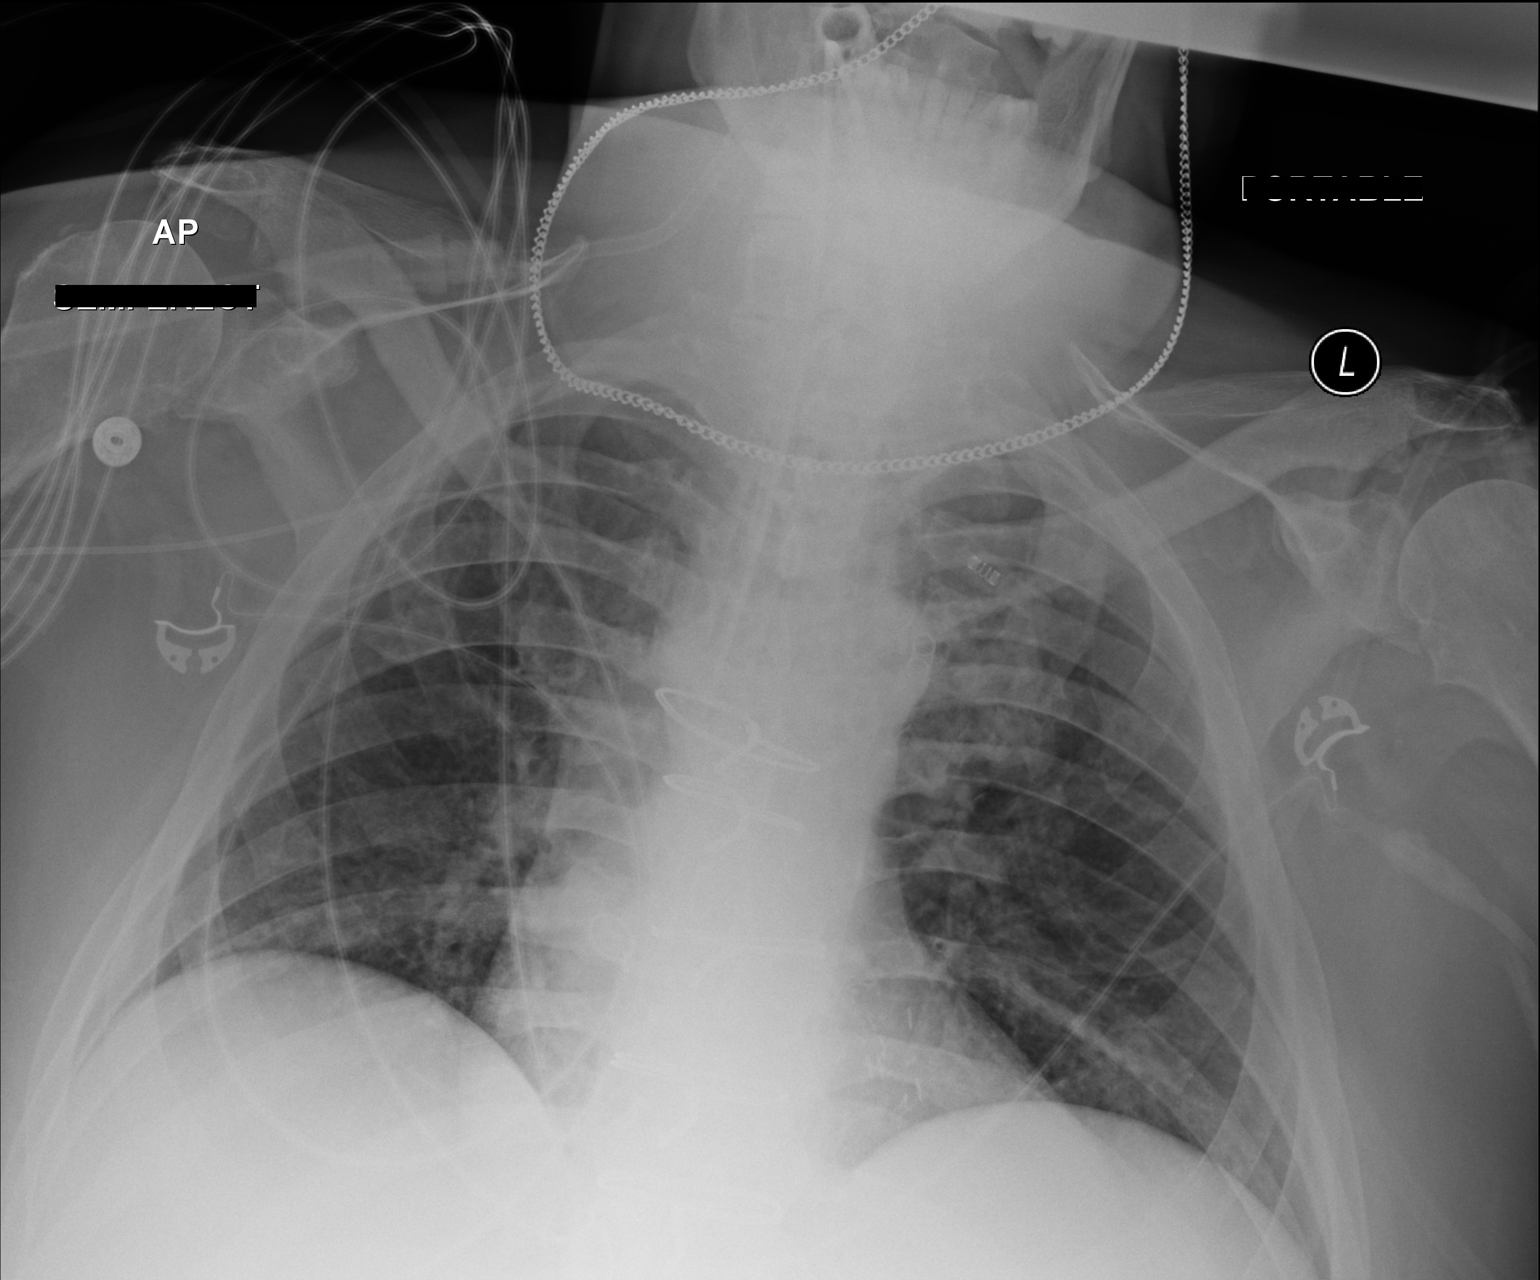

[1 of 1 positions shown; findings below may reference images not displayed]

FINDINGS: Cardiac shadow is within normal limits. Endotracheal tube is again
seen but lies just above the carina. This could be withdrawn 2-3 cm.
A right-sided PICC line is again noted in satisfactory position. The
overall inspiratory effort is poor. Mild bibasilar atelectatic
changes are seen. No bony abnormality is noted.
IMPRESSION: Endotracheal tube at the level of the carina. This should be
withdrawn 2-3 cm.

These results will be called to the ordering clinician or
representative by the Radiologist Assistant, and communication
documented in the PACS or zVision Dashboard.

## 2017-07-26 IMAGING — CR DG CHEST 1V PORT
1 series · 1 of 1 positions shown · non-contrast
Comparison: 05/02/2016.

CLINICAL DATA: Intubation.

EXAM:
PORTABLE CHEST 1 VIEW

[AP]
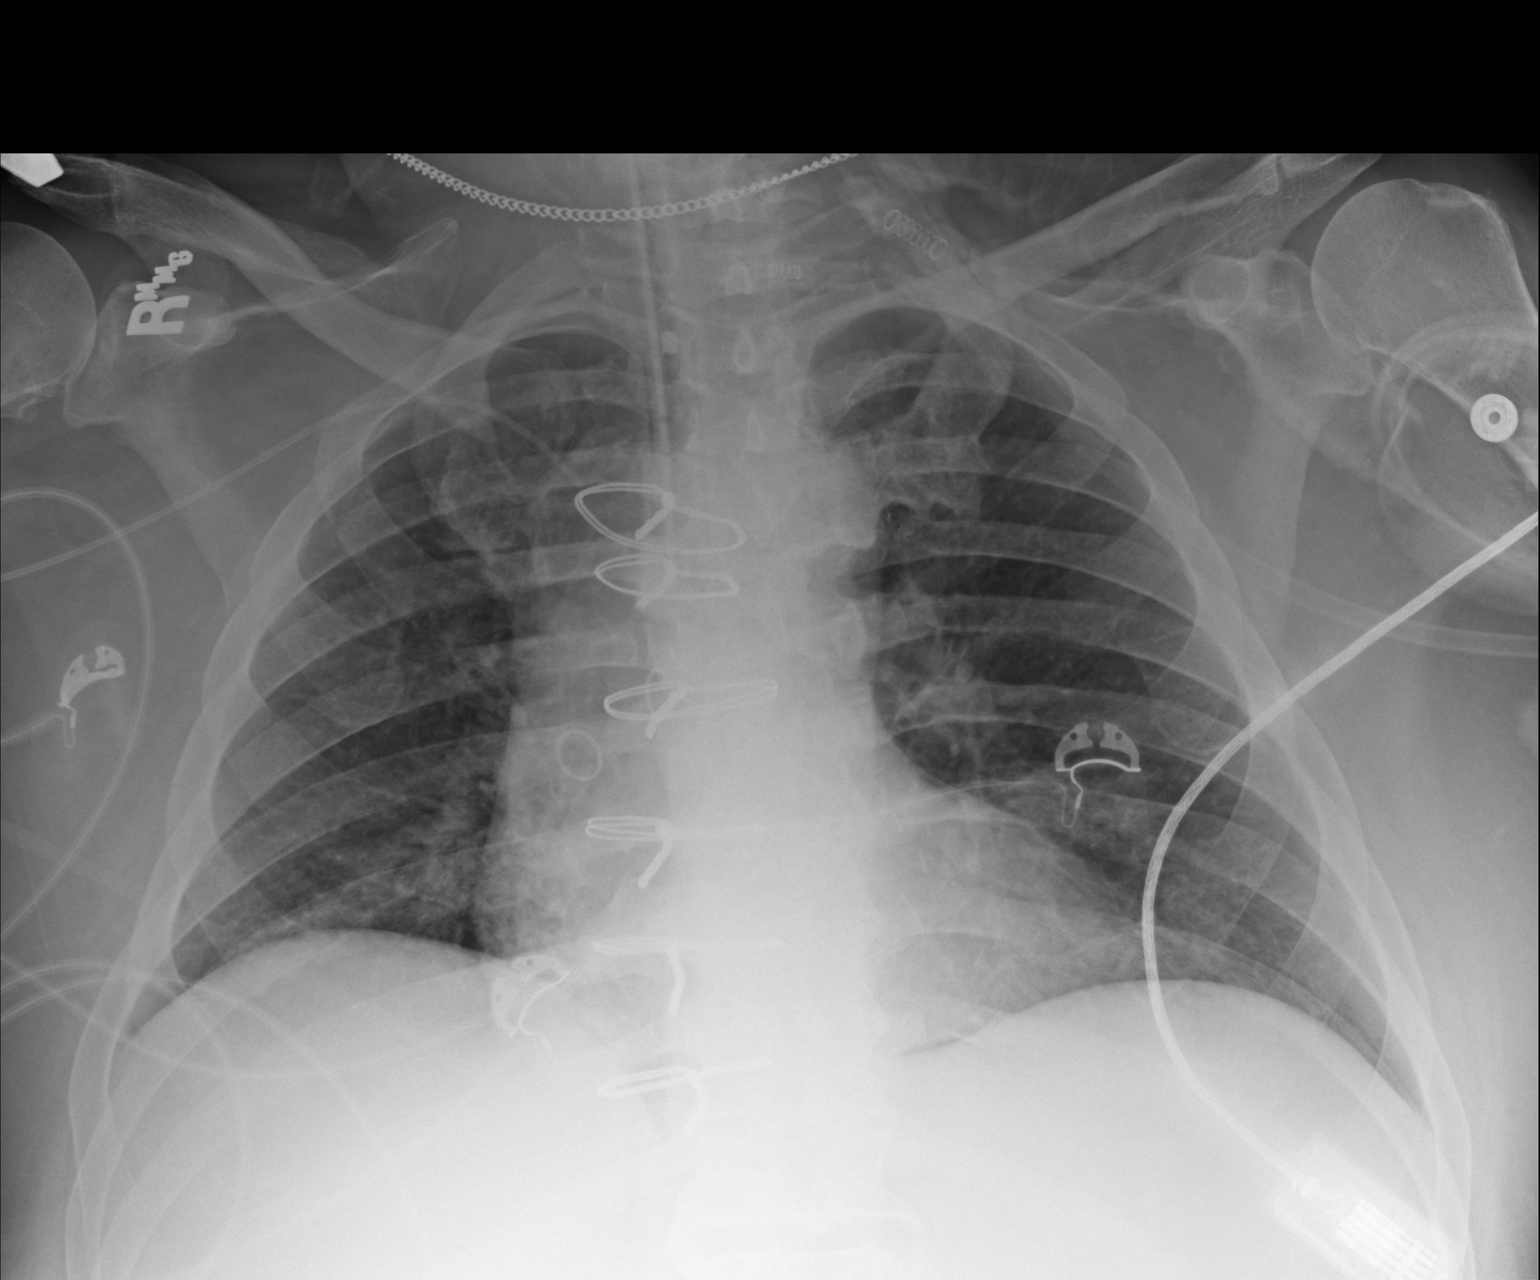

[1 of 1 positions shown; findings below may reference images not displayed]

FINDINGS: Right PICC line noted with tip projected over the right
brachiocephalic region. Endotracheal tube tip in unchanged position
approximately 1.7 cm above the lower portion of the carina.
Withdrawal of approximately 2 cm should be considered. Low lung
volumes. Mild infiltrate right lung base cannot be excluded. Prior
CABG . Heart size stable . No acute bony abnormality .
IMPRESSION: 1. Endotracheal tube tip 1.7 cm above the carina. Proximal
repositioning of approximately 2 cm should be considered. Right PICC
line in unchanged position.

2. Prior CABG.  Heart size stable.

3. Low lung volumes. Mild right base infiltrate cannot be excluded
on today's exam .

## 2017-07-27 IMAGING — CT CT ABD-PELV W/O CM
1 series · 1 of 1 positions shown · non-contrast
Comparison: Abdominal ultrasound 04/28/2016. Abdominal pelvic CT
04/24/2016.

CLINICAL DATA: Found unresponsive at [HOSPITAL] 04/21/2016 with
agonal respirations, respiratory failure and multi-system failure.
Sepsis.

EXAM:
CT CHEST, ABDOMEN AND PELVIS WITHOUT CONTRAST
TECHNIQUE: Multidetector CT imaging of the chest, abdomen and pelvis was
performed following the standard protocol without IV contrast.

[Series 1: topogram 0.6 t20f · coronal · 2.00mm/px · 1 of 1 slices shown]
[im 1/1]
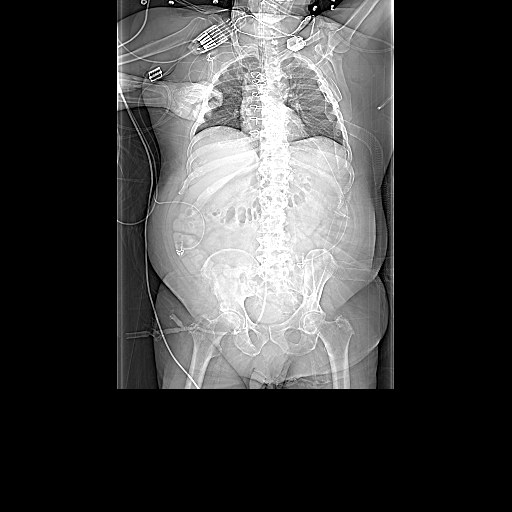

[1 of 1 positions shown; findings below may reference images not displayed]

FINDINGS: CT CHEST FINDINGS

Cardiovascular: Diffuse atherosclerosis of the aorta, great vessels
and coronary arteries status post CABG. Right arm PICC extends near
the confluence of the brachiocephalic veins. No acute vascular
findings are demonstrated on noncontrast imaging. There are mitral
annular and probable aortic valvular calcifications. The heart size
is normal. There is no pericardial effusion.

Mediastinum/Nodes: There are no enlarged mediastinal, hilar or
axillary lymph nodes. Endotracheal tube tip is in the midtrachea.
The thyroid gland and esophagus appear unremarkable.

Lungs/Pleura: There is no residual pleural effusion. There are
improving airspace opacities at both lung bases. There are residual
reticulonodular densities in the lower lobes, greater on the right.
No consolidation or suspicious pulmonary nodule.

Musculoskeletal/Chest wall: There are numerous nondisplaced subacute
appearing rib fractures bilaterally. Changes of diffuse idiopathic
skeletal hyperostosis in the spine.

CT ABDOMEN AND PELVIS FINDINGS

Hepatobiliary: The liver appears unremarkable as imaged in the
noncontrast state. Multiple gallstones are again noted. The
gallbladder remains distended with mild wall thickening and
surrounding inflammation. No significant biliary dilatation.

Pancreas: Unremarkable. No pancreatic ductal dilatation or
surrounding inflammatory changes.

Spleen: Normal in size without focal abnormality.

Adrenals/Urinary Tract: Both adrenal glands appear normal. Both
kidneys appear normal without evidence of mass, urinary tract
calculus or hydronephrosis. There are renovascular calcifications
bilaterally. Foley catheter has been removed. There is mild bladder
wall thickening. A small amount of air is present within the bladder
lumen.

Stomach/Bowel: No evidence of bowel wall thickening, distention or
surrounding inflammatory change. Appendix not clearly seen.
Percutaneous gastrostomy remains in place. The rectal tube has been
removed.

Vascular/Lymphatic: There are no enlarged abdominal or pelvic lymph
nodes. Extensive aortic and branch vessel atherosclerosis again
noted.

Reproductive: The prostate gland and seminal vesicles appear
unremarkable.

Other: There is a small amount of ascites, primarily in both
pericolic gutters. No focal extraluminal fluid collection
identified. Subcutaneous and mesenteric edema has improved.

Musculoskeletal: No acute or significant osseous findings.
IMPRESSION: 1. Interval improvement in bibasilar airspace opacities. There are
residual reticulonodular densities at both lung bases, worse on the
right. This could be secondary to aspiration or atypical infection.
2. Multiple gallstones with persistent gallbladder distention and
possible mild pericholecystic inflammation. Appearance is similar to
previous CT, and ultrasound done 6 days ago did not show findings
highly suspicious for cholecystitis. If that is a clinical concern,
consider hepatobiliary scan.
3. Diffuse atherosclerosis.
4. Small amount of ascites without evidence of abscess.
5. Nondisplaced bilateral rib fractures, probably from previous CPR.

## 2017-07-29 IMAGING — CR DG CHEST 1V PORT
1 series · 1 of 1 positions shown · non-contrast
Comparison: 05/05/2016

CLINICAL DATA: Endotracheal tube.

EXAM:
PORTABLE CHEST 1 VIEW

[AP]
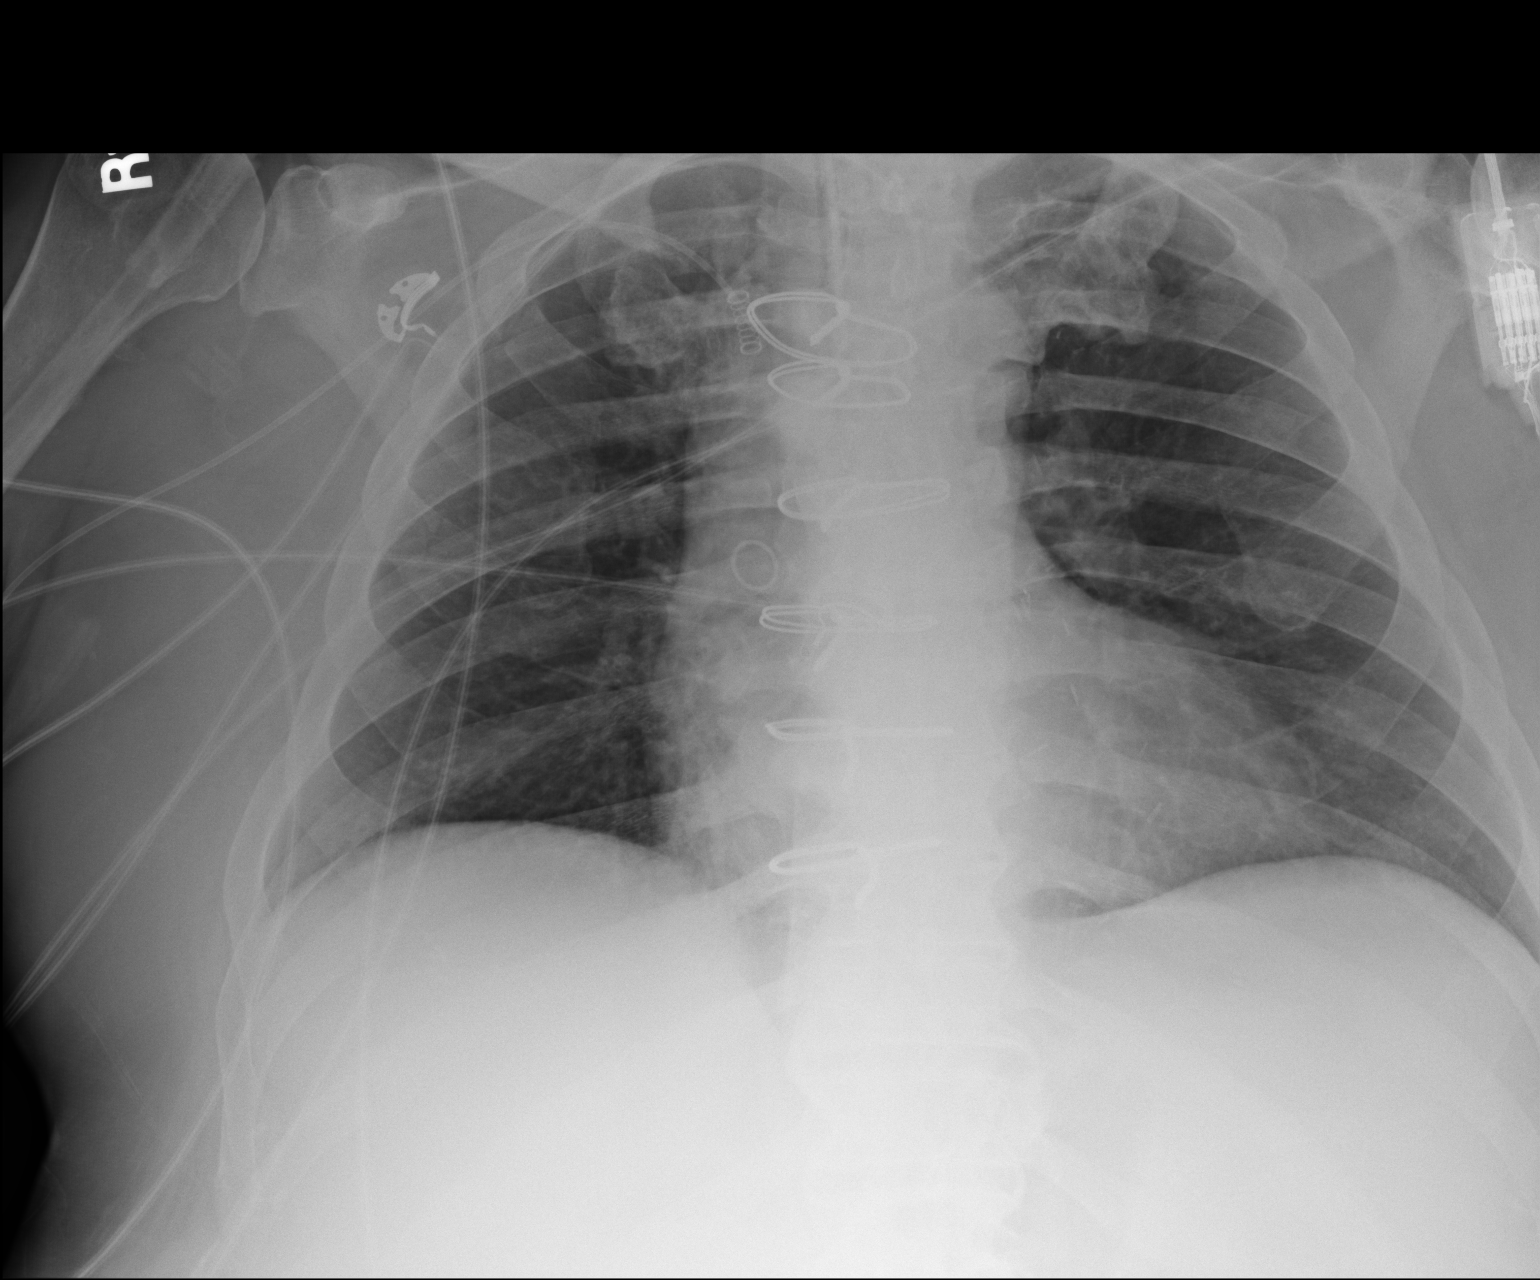

[1 of 1 positions shown; findings below may reference images not displayed]

FINDINGS: Sternotomy wires are unchanged. Endotracheal tube has tip 3.3 cm
above the carina. Right-sided PICC line unchanged with tip over the
region of the junction of the right subclavian vein to SVC.

Lungs are hypoinflated and otherwise clear. Cardiomediastinal
silhouette is within normal. Evidence of nondisplaced fractures of
the anterior lateral right second and fourth ribs unchanged. The
IMPRESSION: No acute cardiopulmonary disease.

Tubes and lines as described.

Nondisplaced right anterior lateral second and fourth rib fractures.

## 2017-07-30 IMAGING — CR DG CHEST 1V PORT
1 series · 1 of 1 positions shown · non-contrast
Comparison: 05/06/2016

CLINICAL DATA: Acute respiratory failure with hypoxia. Sepsis.
Endotracheal tube.

EXAM:
PORTABLE CHEST 1 VIEW

[AP]
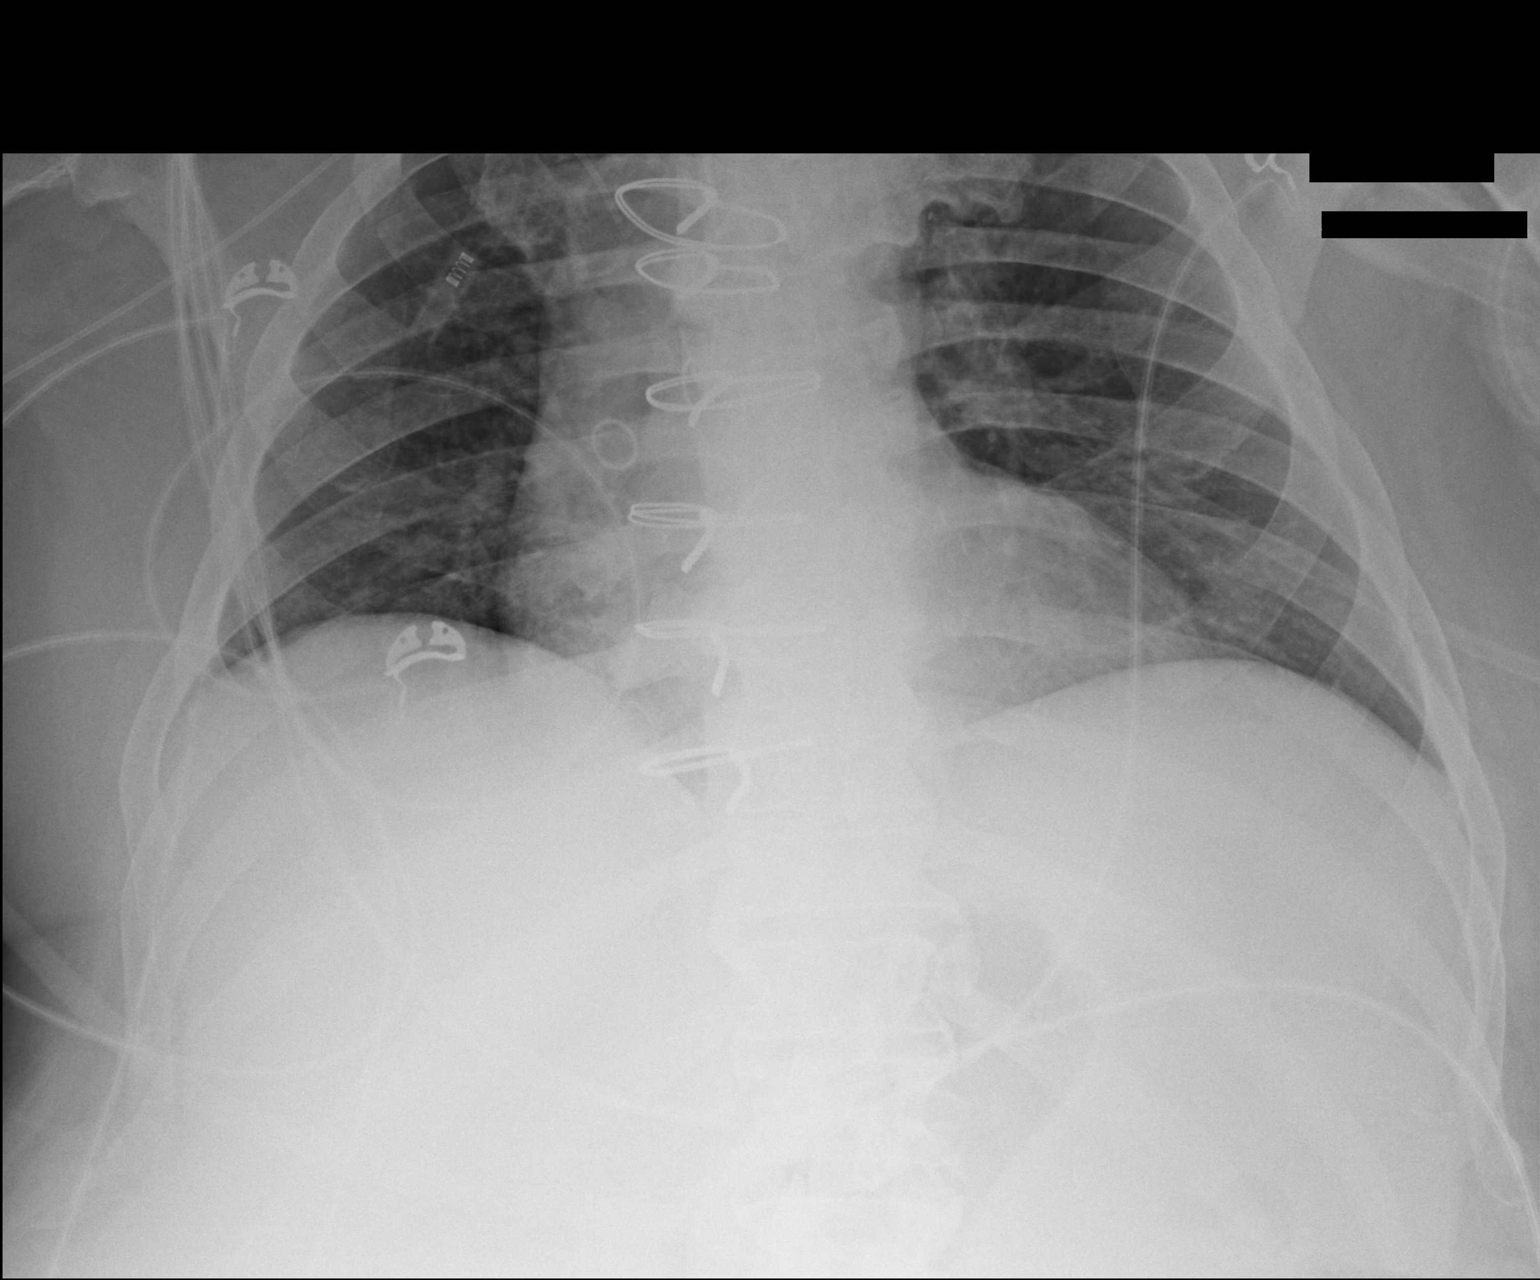

[1 of 1 positions shown; findings below may reference images not displayed]

FINDINGS: Endotracheal tube and PICC appear in good position, unchanged. Heart
size and pulmonary vascularity are normal and the lungs are clear.
CABG. Healing fracture of the anterior aspect of the right second
rib.
IMPRESSION: No acute cardiopulmonary disease.  No change since the prior exam.
